# Patient Record
Sex: Female | Born: 1935
Health system: Southern US, Community
[De-identification: ages and names within clinical notes are randomized; demographics above are authoritative.]

## PROBLEM LIST (undated history)

## (undated) DIAGNOSIS — R32 Unspecified urinary incontinence: Secondary | ICD-10-CM

## (undated) DIAGNOSIS — S065X9A Traumatic subdural hemorrhage with loss of consciousness of unspecified duration, initial encounter: Secondary | ICD-10-CM

## (undated) DIAGNOSIS — R569 Unspecified convulsions: Secondary | ICD-10-CM

## (undated) DIAGNOSIS — A159 Respiratory tuberculosis unspecified: Secondary | ICD-10-CM

## (undated) DIAGNOSIS — I10 Essential (primary) hypertension: Secondary | ICD-10-CM

## (undated) DIAGNOSIS — S0300XA Dislocation of jaw, unspecified side, initial encounter: Secondary | ICD-10-CM

## (undated) DIAGNOSIS — E039 Hypothyroidism, unspecified: Secondary | ICD-10-CM

## (undated) DIAGNOSIS — E215 Disorder of parathyroid gland, unspecified: Secondary | ICD-10-CM

## (undated) DIAGNOSIS — M1990 Unspecified osteoarthritis, unspecified site: Secondary | ICD-10-CM

## (undated) DIAGNOSIS — R5381 Other malaise: Secondary | ICD-10-CM

## (undated) DIAGNOSIS — F32A Depression, unspecified: Secondary | ICD-10-CM

## (undated) DIAGNOSIS — S065XAA Traumatic subdural hemorrhage with loss of consciousness status unknown, initial encounter: Secondary | ICD-10-CM

## (undated) DIAGNOSIS — N183 Chronic kidney disease, stage 3 unspecified: Secondary | ICD-10-CM

## (undated) DIAGNOSIS — E079 Disorder of thyroid, unspecified: Secondary | ICD-10-CM

## (undated) DIAGNOSIS — I639 Cerebral infarction, unspecified: Secondary | ICD-10-CM

## (undated) DIAGNOSIS — R609 Edema, unspecified: Secondary | ICD-10-CM

## (undated) DIAGNOSIS — E7800 Pure hypercholesterolemia, unspecified: Secondary | ICD-10-CM

## (undated) DIAGNOSIS — F329 Major depressive disorder, single episode, unspecified: Secondary | ICD-10-CM

## (undated) DIAGNOSIS — R634 Abnormal weight loss: Secondary | ICD-10-CM

## (undated) DIAGNOSIS — E785 Hyperlipidemia, unspecified: Secondary | ICD-10-CM

## (undated) HISTORY — DX: Other malaise: R53.81

## (undated) HISTORY — DX: Edema, unspecified: R60.9

## (undated) HISTORY — DX: Abnormal weight loss: R63.4

## (undated) HISTORY — PX: BACK SURGERY: SHX140

## (undated) HISTORY — DX: Cerebral infarction, unspecified: I63.9

## (undated) HISTORY — DX: Disorder of parathyroid gland, unspecified: E21.5

## (undated) HISTORY — DX: Dislocation of jaw, unspecified side, initial encounter: S03.00XA

## (undated) HISTORY — DX: Disorder of thyroid, unspecified: E07.9

## (undated) HISTORY — DX: Unspecified urinary incontinence: R32

## (undated) HISTORY — DX: Unspecified osteoarthritis, unspecified site: M19.90

## (undated) HISTORY — PX: ABDOMINAL HYSTERECTOMY: SHX81

## (undated) HISTORY — PX: OTHER SURGICAL HISTORY: SHX169

## (undated) HISTORY — DX: Hyperlipidemia, unspecified: E78.5

## (undated) HISTORY — PX: BRAIN SURGERY: SHX531

## (undated) SURGERY — LEFT HEART CATH AND CORONARY ANGIOGRAPHY
Anesthesia: Moderate Sedation

---

## 2001-10-19 ENCOUNTER — Encounter: Payer: Self-pay | Admitting: Neurosurgery

## 2001-10-19 ENCOUNTER — Encounter (INDEPENDENT_AMBULATORY_CARE_PROVIDER_SITE_OTHER): Payer: Self-pay

## 2001-10-19 ENCOUNTER — Inpatient Hospital Stay (HOSPITAL_COMMUNITY): Admission: AD | Admit: 2001-10-19 | Discharge: 2001-10-30 | Payer: Self-pay | Admitting: Neurosurgery

## 2001-10-20 ENCOUNTER — Encounter: Payer: Self-pay | Admitting: Pulmonary Disease

## 2001-10-21 ENCOUNTER — Encounter: Payer: Self-pay | Admitting: Pulmonary Disease

## 2001-10-21 ENCOUNTER — Encounter: Payer: Self-pay | Admitting: Neurosurgery

## 2001-10-22 ENCOUNTER — Encounter: Payer: Self-pay | Admitting: Pulmonary Disease

## 2001-10-23 ENCOUNTER — Encounter: Payer: Self-pay | Admitting: Neurosurgery

## 2001-10-25 ENCOUNTER — Encounter: Payer: Self-pay | Admitting: Neurological Surgery

## 2001-10-30 ENCOUNTER — Inpatient Hospital Stay (HOSPITAL_COMMUNITY)
Admission: RE | Admit: 2001-10-30 | Discharge: 2001-11-28 | Payer: Self-pay | Admitting: Physical Medicine & Rehabilitation

## 2001-11-13 ENCOUNTER — Encounter: Payer: Self-pay | Admitting: Physical Medicine & Rehabilitation

## 2001-11-28 ENCOUNTER — Encounter: Payer: Self-pay | Admitting: Physical Medicine & Rehabilitation

## 2002-01-24 ENCOUNTER — Encounter: Payer: Self-pay | Admitting: Neurosurgery

## 2002-01-26 ENCOUNTER — Inpatient Hospital Stay (HOSPITAL_COMMUNITY): Admission: RE | Admit: 2002-01-26 | Discharge: 2002-01-29 | Payer: Self-pay | Admitting: Neurosurgery

## 2002-03-15 ENCOUNTER — Encounter: Admission: RE | Admit: 2002-03-15 | Discharge: 2002-03-15 | Payer: Self-pay | Admitting: Neurosurgery

## 2002-03-15 ENCOUNTER — Encounter: Payer: Self-pay | Admitting: Neurosurgery

## 2003-07-14 ENCOUNTER — Other Ambulatory Visit: Payer: Self-pay

## 2004-10-29 ENCOUNTER — Ambulatory Visit: Payer: Self-pay | Admitting: Internal Medicine

## 2005-01-07 ENCOUNTER — Ambulatory Visit: Payer: Self-pay | Admitting: Unknown Physician Specialty

## 2005-01-08 ENCOUNTER — Ambulatory Visit: Payer: Self-pay | Admitting: Unknown Physician Specialty

## 2005-10-25 ENCOUNTER — Ambulatory Visit: Payer: Self-pay | Admitting: Internal Medicine

## 2005-11-02 ENCOUNTER — Ambulatory Visit: Payer: Self-pay | Admitting: Internal Medicine

## 2005-12-23 ENCOUNTER — Encounter: Payer: Self-pay | Admitting: Podiatry

## 2005-12-28 ENCOUNTER — Encounter: Payer: Self-pay | Admitting: Podiatry

## 2006-06-09 ENCOUNTER — Ambulatory Visit: Payer: Self-pay | Admitting: Nurse Practitioner

## 2006-09-07 ENCOUNTER — Other Ambulatory Visit: Payer: Self-pay

## 2006-09-07 ENCOUNTER — Inpatient Hospital Stay: Payer: Self-pay | Admitting: Internal Medicine

## 2007-02-28 ENCOUNTER — Ambulatory Visit: Payer: Self-pay | Admitting: Internal Medicine

## 2007-03-28 ENCOUNTER — Ambulatory Visit: Payer: Self-pay | Admitting: Family Medicine

## 2007-05-20 ENCOUNTER — Inpatient Hospital Stay: Payer: Self-pay | Admitting: Internal Medicine

## 2007-05-20 ENCOUNTER — Other Ambulatory Visit: Payer: Self-pay

## 2007-05-29 ENCOUNTER — Ambulatory Visit: Payer: Self-pay | Admitting: Unknown Physician Specialty

## 2007-07-13 ENCOUNTER — Ambulatory Visit (HOSPITAL_COMMUNITY): Admission: RE | Admit: 2007-07-13 | Discharge: 2007-07-13 | Payer: Self-pay | Admitting: Surgery

## 2007-07-26 ENCOUNTER — Ambulatory Visit: Payer: Self-pay | Admitting: Internal Medicine

## 2007-09-13 ENCOUNTER — Encounter (INDEPENDENT_AMBULATORY_CARE_PROVIDER_SITE_OTHER): Payer: Self-pay | Admitting: Surgery

## 2007-09-13 ENCOUNTER — Ambulatory Visit (HOSPITAL_COMMUNITY): Admission: RE | Admit: 2007-09-13 | Discharge: 2007-09-14 | Payer: Self-pay | Admitting: Surgery

## 2007-09-28 ENCOUNTER — Ambulatory Visit: Payer: Self-pay | Admitting: Internal Medicine

## 2008-01-18 ENCOUNTER — Emergency Department: Payer: Self-pay | Admitting: Emergency Medicine

## 2008-01-18 ENCOUNTER — Other Ambulatory Visit: Payer: Self-pay

## 2008-02-13 ENCOUNTER — Ambulatory Visit: Payer: Self-pay | Admitting: Unknown Physician Specialty

## 2008-03-04 ENCOUNTER — Ambulatory Visit: Payer: Self-pay | Admitting: Internal Medicine

## 2008-04-06 ENCOUNTER — Ambulatory Visit: Payer: Self-pay | Admitting: Ophthalmology

## 2009-03-13 ENCOUNTER — Ambulatory Visit: Payer: Self-pay | Admitting: Internal Medicine

## 2010-03-06 ENCOUNTER — Ambulatory Visit: Payer: Self-pay | Admitting: Physician Assistant

## 2010-04-06 ENCOUNTER — Inpatient Hospital Stay: Payer: Self-pay | Admitting: Internal Medicine

## 2010-04-28 ENCOUNTER — Inpatient Hospital Stay: Payer: Self-pay | Admitting: Internal Medicine

## 2010-05-15 ENCOUNTER — Ambulatory Visit: Payer: Self-pay | Admitting: Internal Medicine

## 2011-01-12 NOTE — Op Note (Signed)
NAME:  Shawna [name], Shawna [name]              ACCOUNT NO.:  [id number]   MEDICAL RECORD NO.:  [id number]          PATIENT TYPE:  OIB   LOCATION:  2550                         FACILITY:  [facility]   PHYSICIAN:  Velora Heckler, MD      DATE OF BIRTH:  02-Nov-1935   DATE OF PROCEDURE:  09/13/2007  DATE OF DISCHARGE:                               OPERATIVE REPORT   PREOPERATIVE DIAGNOSIS:  Primary hyperparathyroidism.   POSTOPERATIVE DIAGNOSIS:  Primary hyperparathyroidism.   PROCEDURE:  Right inferior minimally invasive parathyroidectomy.   SURGEON:  Velora Heckler, M.D., FACS   ASSISTANT:  Magnus Ivan, R.N.F.A.   ANESTHESIA:  General per Dr. Diamantina Monks.   ESTIMATED BLOOD LOSS:  Minimal.   PREPARATION:  Betadine.   COMPLICATIONS:  None.   INDICATIONS:  The patient is a 75 year old white female from [place],  [place].  She is referred by Dr. Einar Crow and Dr. Francia Greaves, both of the [facility].  The patient has been noted with  elevated serum calcium levels ranging from 10.7-11.3.  An intact PTH  level was elevated at 157.  The patient underwent sestamibi scan  September 2008 at [facility] demonstrating a  parathyroid adenoma in the right inferior position.  The patient now  comes to surgery for neck exploration and resection.   BODY OF REPORT:  Procedure was done in OR #16 at the [facility].  The patient was brought to the operating room and  placed in the supine position on the operating room table.  Following  administration of general anesthesia, the patient was positioned and  then prepped and draped in the usual strict aseptic fashion.  After  ascertaining that an adequate level of anesthesia had been achieved, a  lower right neck incision was made with a #15 blade.  Dissection was  carried through subcutaneous tissues and platysma.  Hemostasis was  obtained with electrocautery.  Subplatysmal flaps were  developed  cephalad and caudad.  A Weitlaner retractor was placed for exposure.  Strap muscles were incised in the midline and reflected to the right.  Inferior pole of the thyroid gland was identified and mobilized.  Dissection in the tracheoesophageal groove revealed a prominent  thyrothymic tract containing tissue.  This was dissected out.  A  Ligaclip was placed at each end, and the tract was excised using the  harmonic scalpel.  It was submitted to pathology.  Dr. Colonel Bald from  pathology did a frozen section and identified what appears to be normal  parathyroid tissue within the thyrothymic tract.  Further exploration in  the tracheoesophageal groove, however, reveals an enlarged nodule with  the appearance of parathyroid adenoma.  This was gently dissected out.  Vascular tributaries were divided between small and medium hemoclips  with the harmonic scalpel.  Using a Administrator, [employer], the mass was  gently mobilized away from surrounding structures and delivered up and  into the wound.  It was fully excised using the harmonic scalpel.  Specimen was submitted to pathology.  It weighed 1600 mg and was  parathyroid tissue  consistent with parathyroid adenoma.  Good hemostasis  was obtained throughout the wound.  Surgicel was placed in the operative  field.  Strap muscles were reapproximated in the midline with  interrupted 3-0 Vicryl sutures.  Platysma was closed with interrupted 3-  0 Vicryl sutures.  Skin was closed with a running 4-0 Vicryl  subcuticular suture.  Wound was washed and dried, and Benzoin and Steri-  Strips were applied.  Sterile dressings were applied.  The patient was  awakened from anesthesia and brought to the recovery room in stable  condition.  The patient tolerated the procedure well.      Velora Heckler, MD  Electronically Signed     TMG/MEDQ  D:  09/13/2007  T:  09/13/2007  Job:  [id number]   cc:   Einar Crow, M.D.  Francia Greaves, M.D.

## 2011-01-15 NOTE — H&P (Signed)
[place]  Patient:    Shawna [name], Shawna [name] Visit Number: [id number] MRN: [id number]          Service Type: SUR Location: 3000 3031 01 Attending Physician:  Danella Penton Dictated by:   Tanya Nones. Jeral Fruit, M.D. Admit Date:  01/26/2002                           History and Physical  HISTORY OF PRESENT ILLNESS:  Shawna [name] is a lady who is being admitted today for reconstruction of a skull defect on the right side.  Back on October 19, 2001, this lady had a subdural hematoma which was evacuated through a craniotomy.  After surgery, the patient did well.  She was transferred to the rehabilitation center and eventually she was discharged to go home.  She was seen by me in my office and clinically she was doing much better.  Because of that, we decided to proceed with repair of the skull defect.  MEDICATIONS: 1. Effexor. 2. Levoxyl. 3. Lipitor. 4. Dilantin. 5. Catapres.  SOCIAL HISTORY:  She does not drink.  She does not use any tobacco.  REVIEW OF SYSTEMS:  Positive for a history of high cholesterol and high blood pressure.  PAST SURGICAL HISTORY:  She had back surgery many years ago, hysterectomy, and the subdural hematoma.  PHYSICAL EXAMINATION:  HEENT:  There is a skull defect in the right frontotemporal area from the previous surgery.  NECK:  She has hyperflexibility.  LUNGS:  Clear.  HEART:  Normal heart sounds.  ABDOMEN:  Normal.  EXTREMITIES:  Normal pulses.  BACK:  She has a scar in the lumbar area from previous surgery.  NEUROLOGIC:  Mental status normal.  She is oriented x 3.  Although slow to respond, nonetheless she is no worse worse now.  She knows that she is in [facility], the date, and the name of the president.  The cranial nerves are normal, except for some mild weakness in the right face. Strength is 5/5.  Sensation is normal.  LABORATORY DATA:  She had a previous CT scan which  showed a skull defect on the right side from the previous surgery.  CLINICAL IMPRESSION:  Right frontotemporal skull defect post subdural hematoma.  PLAN:  The patient is going to be admitted for a right frontotemporal cranioplasty.  The risks of rebleeding, need for removal of the flap, and infection. Dictated by:   Tanya Nones. Jeral Fruit, M.D. Attending Physician:  Danella Penton DD:  01/26/02 TD:  01/27/02 Job: [id number]

## 2011-01-15 NOTE — Op Note (Signed)
[place]  Patient:    ALOIS, MINCER Visit Number: [id number] MRN: [id number]          Service Type: MED Location: 3100 3104 01 Attending Physician:  Danella Penton Dictated by:   Tanya Nones. Jeral Fruit, M.D. Proc. Date: 10/19/01 Admit Date:  10/19/2001                             Operative Report  PREOPERATIVE DIAGNOSIS:  Large right subdural hematoma with placement of the brain from right to left, and subdural hematoma going into the tentorium, Coumadin therapy.  POSTOPERATIVE DIAGNOSIS:  Large right subdural hematoma with placement of the brain from right to left, and subdural hematoma going into the tentorium, Coumadin therapy.  PROCEDURE:  Right temporal frontal parietal craniotomy with evacuation of subdural hematoma.  SURGEON:  Tanya Nones. Jeral Fruit, M.D.  ASSISTANT:  Stefani Dama, M.D.  CLINICAL HISTORY:  The patient is a 75 year old lady who was on Coumadin and who was transferred from [facility] because of subdural hematoma after she fell.  I admitted this patient and by the time she came over here, was already intubated.  She was paralyzed with medication.  The pupils are 4 mm and nonreactive.  Because of the medication they used to ______ her, it was difficult to do an evaluation except that she was having some bilateral degeneration.  I talked the patient with the family, and the patient was taken immediately for surgery.  DESCRIPTION OF PROCEDURE:  The patient was taken to the OR and the whole head was prepped with Betadine.  The area was prepped with Betadine.  Immediately, an incision was made in the upper temporal area through the skin down to the galea.  A bur hole was made and immediately I opened the dural matter and I started draining the subdural hematoma.  It was thick clots.  Once we had an opening for decompression, we did a standard temporal parietal frontal craniotomy through the scalp.  A different bur  hole was made and the craniotomy flap was elevated.  The dural matter was also elevated.  Indeed, this patient has a thick clot of approximately 4-5 cm, going all the way from the frontal area to the middle fossa to the posterior fossa down into the tentorium.  Irrigation was achieved using plenty of irrigation.  We found two areas of contusion, one in the posterior aspect of the temporal lobe and the other one in the _______ of the temporal lobe.  Hemostasis was done and Gelfoam was used in this area to achieve hemostasis.  We investigated all the area, and again, we had plenty _______ of this dura and the brain was pulsatile.  We waited at least 20 minutes just to be sure, and there was not any more bleeding.  Having achieved this, we put two drains in the subdural space.  We closed the dural matter loosely using silk.  The bone flap was not pulled back in place.  A Hemovac was left in the epidural space and the scalp was closed with Vicryl and nylon.  The patient is going to be transferred back to the intensive care unit.  The prognosis is quite grave. Dictated by:   Tanya Nones. Jeral Fruit, M.D. Attending Physician:  Danella Penton DD:  10/19/01 TD:  10/20/01 Job: [id number]

## 2011-01-15 NOTE — Discharge Summary (Signed)
[place]. [facility]  Patient:    Shawna [name], Shawna [name] Visit Number: [id number] MRN: [id number]          Service Type: SUR Location: 3000 3031 01 Attending Physician:  Danella Penton Dictated by:   Tanya Nones. Jeral Fruit, M.D. Admit Date:  01/26/2002 Discharge Date: 01/29/2002                             Discharge Summary  ADMISSION DIAGNOSIS: Right frontal temporal skull defect.  FINAL DIAGNOSIS: Right frontal temporal skull defect.  HISTORY OF PRESENT ILLNESS: The patient was admitted to repair the skull defect. The patient was previously admitted to the hospital with a large subdural hematoma. At this time evacuation was accomplished, leaving the bone flap _____. Today she is coming to have the skull defect repaired.  LABORATORY DATA: Normal.  HOSPITAL COURSE: The patient was taken to surgery on 01/26/02, and a right frontal temporal cranioplasty was done. Today she is doing much better. She is awake and following commands and she is ready to go home.  CONDITION ON DISCHARGE: Improved.  DISCHARGE MEDICATIONS: Vicodin. She is to continue taking the other medication including Dilantin.  DISCHARGE INSTRUCTIONS:  Regular diet. She is to try not to do any heavy lifting.  FOLLOW UP: We will see her next week. Dictated by:   Tanya Nones. Jeral Fruit, M.D. Attending Physician:  Danella Penton DD:  01/29/02 TD:  01/30/02 Job: [phone]

## 2011-01-15 NOTE — Op Note (Signed)
[place]  Patient:    Shawna [name], Shawna [name] Visit Number: [id number] MRN: [id number]          Service Type: SUR Location: 3000 3031 01 Attending Physician:  Danella Penton Dictated by:   Tanya Nones. Jeral Fruit, M.D. Proc. Date: 01/26/02 Admit Date:  01/26/2002                             Operative Report  PREOPERATIVE DIAGNOSIS:  Right frontal temporal cranial defect, secondary to acute subdural hematoma.  POSTOPERATIVE DIAGNOSIS:  Right frontal temporal cranial defect, secondary to acute subdural hematoma.  PROCEDURE:  Right frontal temporal cranioplasty.  SURGEON:  Tanya Nones. Jeral Fruit, M.D.  ASSISTANT:  Cristi Loron, M.D.  CLINICAL HISTORY:  The patient is being admitted to have a skull defect repair.  This lady had a ________ hematoma ________ .  The surgery was fully explained and the risks were explained in the history and physical.  DESCRIPTION OF PROCEDURE:  The patient was taken to the OR and after adequate sedation, the right side of the head was prepped with Betadine.  Then incision from the previous one was made with phrenic lid upright to the edges.  Prior to that we used Xylocaine with epinephrine to infiltrate the scalp.  We elevated the scalp flap and were able to delineate the ________ fat.  Having done this, we took a ________ methacrylate, and after he was ready for hard consistency we allowed the substance to cover this cul-de-sac.  After it was done, we made several holes to allow drainage of any fluid collection into the scalp.  The plate was secured in place, using of plate and some screws. Having done this, the area was irrigated and the scalp was closed with 0 Vicryl and staples.  The patient did well. Dictated by:   Tanya Nones. Jeral Fruit, M.D. Attending Physician:  Danella Penton DD:  01/26/02 TD:  01/29/02 Job: [id number]

## 2011-01-15 NOTE — Discharge Summary (Signed)
[place]. [facility]  Patient:    PRESTON, WEILL Visit Number: [id number] MRN: [id number]          Service Type: [facility] Attending Physician:  Faith Rogue T Dictated by:   Tanya Nones. Jeral Fruit, M.D. Admit Date:  10/30/2001 Discharge Date: 11/28/2001                             Discharge Summary  ADMISSION DIAGNOSIS:  Right frontotemporal parietal craniotomy for subdural hematoma.  FINAL DIAGNOSIS:  Subdural hematoma in right frontotemporal parietal.  HISTORY OF PRESENT ILLNESS:  The patient was admitted straight to the intensive care unit from the emergency room of [facility] in [place], [place].  The patient has a history of stroke in the past. She had been on Coumadin.  The patient was found to be unconscious.  A CT scan done at [facility] showed a large frontotemporal parietal craniotomy. Her PT and PTT were elevated.  The patient was given vitamin K and fresh frozen plasma and taken to surgery as an emergency.  LABORATORY DATA:  At time of being transferred to the floor, work-up was within normal limits.  HOSPITAL COURSE:  The patient was taken the same day to the operating room where a right frontotemporal parietal craniotomy with evacuation of a large subdural was done.  Prior to surgery and during surgery, we gave her vitamin K, as well as fresh frozen plasma.  After the surgery, she was transferred to the intensive care unit where she remained on a respirator.  The patient continued to get better.  She was able to open her eyes and follow commands. After two weeks, the patient was moving all four extremities, although she was sort of ignoring the left side.  Nevertheless, the patient was able to move all four extremities.  Her anemia resolved.  The PT and PTT were within normal limits.  The patient was taken off of the respirator.  While she was in the intensive care unit, she was seen by the neurological team, including  speech therapy.  On October 30, 2001, after being evaluated by the rehabilitation center, it was decided that the patient would benefit from a further at the rehabilitation center and she was transferred to the rehabilitation center.  CONDITION ON DISCHARGE:  Improving.  DISCHARGE MEDICATIONS:  She will continue on the same medication.  FOLLOW-UP:  I will follow the patient while she is in the hospital.  Once she is discharged, I will see her my office and probably will proceed with a cranioplasty three to four months after her surgery.  ACTIVITY:  This will be up to the rehabilitation center. Dictated by:   Tanya Nones. Jeral Fruit, M.D. Attending Physician:  Faith Rogue T DD:  12/08/01 TD:  12/09/01 Job: [id number]

## 2011-01-15 NOTE — Discharge Summary (Signed)
[place]  Patient:    Shawna [name], Shawna [name] Visit Number: [id number] MRN: [id number]          Service Type: [facility] Attending Physician:  Faith Rogue T Dictated by:   Dian Situ, PA Admit Date:  10/30/2001 Discharge Date: 11/28/2001   CC:         Janeece Riggers. Dareen Piano, M.D.  Hilda Lias, M.D.   Discharge Summary  DISCHARGE DIAGNOSES: 1. Status post craniotomy for subdural hemorrhage. 2. Hypertension. 3. Hypothyroidism. 4. Depression. 5. Post-traumatic anemia. 6. Insomnia, improved. 7. Klebsiella pneumoniae urinary tract infection, treated.  HISTORY OF PRESENT ILLNESS: The patient is a 75 year old female with a history of hypothyroidism and cerebrovascular accident on chronic Coumadin who fell on February 19 and was noted to have confusion with mental status changes.  The next day she was taken to [facility] and required intubation as comatose.  CT done there showed a large subdural hemorrhage and she was transferred to  [facility] on February 20 and underwent right temporal frontoparietal craniotomy for evacuation by Dr. Jeral Fruit. Postoperatively she was placed on Dilantin for seizure prophylaxis at a level of 10.7 today.  She was noted to have decreased movement on the left.  Bedside swallow was done showing minimal dysphagia.  She was placed on thin liquids. Physical therapy was initiated and currently the patient is +2, total, 25% to transfer with tendency to leaning to the right, total assist with 20% to scoot to the right and some visual deficits noted.  PAST MEDICAL HISTORY: 1. Tuberculosis 44 years ago. 2. History of cerebrovascular accident with onset of seizures. 3. Hypothyroidism. 4. Depression diagnosed in December 2002. 5. Right shoulder pain secondary to degenerative joint disease.  ALLERGIES:  No known drug allergies.  SOCIAL HISTORY: The patient is married and lives in a one level  home.. There are one to two steps at entry.  She was independent prior to admission.  She does not use any tobacco or alcohol.  The patient was admitted to rehab on October 29, 2001 for inpatient therapy to consider PT, OT and speech therapy.  PAST ADMISSION: On admission the patient was noted to be impulsive and has had some problems with insomnia.  She was also noted to have problems with depressed mood and Effexor was added q.h.s. to help with sleep as well as appetite and mood.  Gradually during her schedule it has also helped with her insomnia and sundowning issues.  Initially she was noted to have problems with lethargy and endurance issues. A follow-up CT was done on April 1 showing a small amount of right temporal subdural fluid with resolution of the falcine component, expected progression of right parietal infarct without extension, postoperative changes stable.  She was started on Ritalin to help with initiation and arousal and this has helped greatly. By the time of discharge she was able to participate in most therapies without lethargy throughout the day and Ritalin was set up for taper past discharge.  Laboratories were checked on past admission and had been followed along. The last check was March 17: Hemoglobin 12.7,  hematocrit 37.3, white count 5.3, platelets 203,000.  Electrolytes on March 26:  Sodium 140, potassium 3.5, chloride 106, C02 30, BUN 5, creatinine 0.8, glucose 175. AST 33, ALT 30, alkaline phosphatase 92, total bilirubin 0.5.  TSH level checked was normal at 1.862. Dilantin level last done on March 17 was at 8.6.  As the patient had low albumin levels at  3.2, this level can be in the low therapeutic range.  The patient has had issues with a UTI.  She was treated for Citrobacter UTI on past admission. She did have a recurrent UTI on March 15 with greater than 100,000 colonies of Klebsiella pneumoniae and was treated with seven days of Cipro for this.  By  the time of discharge the patients speech and intelligibility had improved to approximately 70% in conversation and attention, memory and organization were also noted to be improved.  Basic comprehension/expression were intact High level comprehension was intact; however, she showed inconsistent understanding of inferential information.  She was oriented times four.  Her short term memory was intact for immediate recall and delay.  She had problems with delay recall.  She required minimal assist for complex verbal solving problems. Verbal organization was intact.  Reading and writing for basic information was intact.  She showed mild impairment in higher expression secondary to decease in attention and organization.  Mild to moderate dysarthria was occasionally noted.  The patient was at supervision for breathing, required minimal assist for dressing, minimal assist for feeding a minimal and minimal assist for advanced ADLs. 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  She continued to have mild left neglect which exacerbated her mild left-sided weakness.  In terms of mobility she was at close supervision to minimal assist for transfers secondary to safety issues, minimal assist ambulating 200 feet with hand held assist secondary to safety issues.  Her visual impairment and difficulty in walking with occasional loss of balance placed her at increased risk for fall, even with assistance.  She was noted to have variable tendency to lean towards the right with increased fatigue or distraction.  However, by the time of discharge she was able to correct this with cuing.  Left inattention to left extremities and environment with decrease in sensation and vision was also impairing her standing balance.  Twenty four hour supervision with assistance as needed is recommended past  discharge and the family is to  provide this.  The patient is to continue with follow-up PT, OT and speech therapy at [facility] outpatient center to begin on December 04, 2001.  On November 28, 2001 the patient is discharged to home.  DISCHARGE MEDICATIONS: 1. Dilantin 100 mg in a.m. and 200 mg q.h.s. 2. Catapres TTS 2 change q. Monday. 3. Levothyroxine 50 mcg a day. 4. Ritalin 10 mg at 7 a.m. and one at noon for three days, then one p.o. b.i.d. times three days and discontinue. 5. EFfexor XR 37.5 mg per day. 6. Trazodone 50 mg q.h.s. p.r.n. no sleep.  ACTIVITY:  Twenty four hour supervision assistance.  SPECIAL INSTRUCTIONS:  No alcohol.  No smoking.  No driving. Outpatient PT, OT and speech therapy at [facility] beginning December 04, 2001 at 9 a.m. to 11 a.m.  FOLLOW-UP: 1. The patient is to follow-up with Dr. Jeral Fruit in the next few weeks. 2. Follow-up with Dr. Hermelinda Medicus on May 7 at noon. Dictated by:   Dian Situ, PA Attending Physician:  Faith Rogue T DD:  01/14/02 TD:  01/16/02 Job: [id number]

## 2011-01-15 NOTE — H&P (Signed)
[place]  Patient:    MCKENIZE, MEZERA Visit Number: [id number] MRN: [id number]          Service Type: MED Location: [id number] Attending Physician:  Danella Penton Dictated by:   Tanya Nones. Jeral Fruit, M.D. Admit Date:  10/19/2001                           History and Physical  CLINICAL HISTORY:  Ms. Uncapher is a 75 year old female who was taken to [facility] in [place].  We were called from the emergency room because the lady was found by x-ray that she had a large subdural hematoma. According to the history, it seems that the patient fell yesterday.  She felt fine but this morning she was found to be confused.  By the time she was seen in the emergency room she was really comatose.  She was intubated, a CT scan was obtained, and it was found that she had a large subdural hematoma with shift from right to left.  The patient had been on Coumadin.  Because of that, we transferred the patient immediately to the intensive care unit and we were ready in the operating room for her.  We started giving vitamin K as well as fresh frozen plasma.  I talked briefly to the family about the outcome and that the patient was taken immediately as an emergency to the operating room for evacuation of subdural hematoma.  The rest of the clinical history and past medical history will be obtained later on after surgery, as this is an emergency. Dictated by:   Tanya Nones. Jeral Fruit, M.D. Attending Physician:  Danella Penton DD:  10/19/01 TD:  10/19/01 Job: [id number]

## 2011-05-19 LAB — CBC
HCT: 38.3
Hemoglobin: 13.1
MCHC: 34.2
MCV: 92.8
Platelets: 157
RBC: 4.12
RDW: 14.1
WBC: 6.2

## 2011-05-19 LAB — DIFFERENTIAL
Basophils Absolute: 0
Basophils Relative: 0
Eosinophils Absolute: 0.2
Eosinophils Relative: 3
Lymphocytes Relative: 29
Lymphs Abs: 1.8
Monocytes Absolute: 0.5
Monocytes Relative: 9
Neutro Abs: 3.6
Neutrophils Relative %: 59

## 2011-05-19 LAB — URINALYSIS, ROUTINE W REFLEX MICROSCOPIC
Bilirubin Urine: NEGATIVE
Glucose, UA: NEGATIVE
Hgb urine dipstick: NEGATIVE
Ketones, ur: NEGATIVE
Nitrite: NEGATIVE
Specific Gravity, Urine: 1.016
pH: 7

## 2011-05-19 LAB — COMPREHENSIVE METABOLIC PANEL
ALT: 16
AST: 23
Albumin: 4.4
Alkaline Phosphatase: 74
BUN: 22
CO2: 23
Calcium: 10.6 — ABNORMAL HIGH
Chloride: 106
Creatinine, Ser: 0.94
GFR calc Af Amer: >60
GFR calc non Af Amer: 59 — ABNORMAL LOW
Glucose, Bld: 102 — ABNORMAL HIGH
Potassium: 4.2
Sodium: 138
Total Bilirubin: 0.6
Total Protein: 7

## 2011-05-19 LAB — PROTIME-INR
INR: 0.9
Prothrombin Time: 12.1

## 2011-05-20 LAB — CALCIUM: Calcium: 8.5

## 2011-06-08 LAB — URINE MICROSCOPIC-ADD ON

## 2011-06-08 LAB — COMPREHENSIVE METABOLIC PANEL
AST: 30
Albumin: 4.2
Alkaline Phosphatase: 73
BUN: 17
CO2: 26
Chloride: 105
Creatinine, Ser: 0.87
GFR calc non Af Amer: >60
Potassium: 3.9
Total Bilirubin: 1

## 2011-06-08 LAB — CBC
HCT: 36.8
MCV: 92.9
Platelets: 170
RBC: 3.96
WBC: 10.1

## 2011-06-08 LAB — DIFFERENTIAL
Basophils Absolute: 0
Basophils Relative: 0
Eosinophils Relative: 3
Monocytes Absolute: 0.8
Neutro Abs: 7.2

## 2011-06-08 LAB — URINALYSIS, ROUTINE W REFLEX MICROSCOPIC
Glucose, UA: NEGATIVE
Hgb urine dipstick: NEGATIVE
Ketones, ur: NEGATIVE
Protein, ur: NEGATIVE
Urobilinogen, UA: 0.2

## 2011-06-08 LAB — PROTIME-INR: Prothrombin Time: 12.7

## 2011-07-21 ENCOUNTER — Ambulatory Visit: Payer: Self-pay | Admitting: Internal Medicine

## 2012-07-24 ENCOUNTER — Ambulatory Visit: Payer: Self-pay | Admitting: Internal Medicine

## 2012-10-09 ENCOUNTER — Ambulatory Visit: Payer: Self-pay | Admitting: Ophthalmology

## 2012-10-16 ENCOUNTER — Ambulatory Visit: Payer: Self-pay | Admitting: Ophthalmology

## 2012-11-14 ENCOUNTER — Ambulatory Visit: Payer: Self-pay | Admitting: Ophthalmology

## 2013-08-14 ENCOUNTER — Ambulatory Visit: Payer: Self-pay | Admitting: Internal Medicine

## 2013-12-08 DIAGNOSIS — I10 Essential (primary) hypertension: Secondary | ICD-10-CM | POA: Diagnosis present

## 2013-12-08 DIAGNOSIS — F325 Major depressive disorder, single episode, in full remission: Secondary | ICD-10-CM | POA: Insufficient documentation

## 2013-12-08 DIAGNOSIS — N183 Chronic kidney disease, stage 3 unspecified: Secondary | ICD-10-CM | POA: Insufficient documentation

## 2013-12-08 DIAGNOSIS — N1831 Chronic kidney disease, stage 3a: Secondary | ICD-10-CM | POA: Insufficient documentation

## 2013-12-08 DIAGNOSIS — R609 Edema, unspecified: Secondary | ICD-10-CM | POA: Insufficient documentation

## 2013-12-08 DIAGNOSIS — E785 Hyperlipidemia, unspecified: Secondary | ICD-10-CM | POA: Insufficient documentation

## 2013-12-08 DIAGNOSIS — Z8673 Personal history of transient ischemic attack (TIA), and cerebral infarction without residual deficits: Secondary | ICD-10-CM | POA: Insufficient documentation

## 2013-12-08 DIAGNOSIS — E039 Hypothyroidism, unspecified: Secondary | ICD-10-CM | POA: Insufficient documentation

## 2014-02-21 ENCOUNTER — Encounter: Payer: Self-pay | Admitting: Internal Medicine

## 2014-06-30 DIAGNOSIS — Z6835 Body mass index (BMI) 35.0-35.9, adult: Secondary | ICD-10-CM | POA: Insufficient documentation

## 2014-09-02 ENCOUNTER — Ambulatory Visit: Payer: Self-pay | Admitting: Internal Medicine

## 2014-12-20 NOTE — Op Note (Signed)
PATIENT NAME:  [name], Shawna LundJANET A MR#:  [id number] DATE OF BIRTH:  12/10/1935  DATE OF PROCEDURE:  10/16/2012  PREOPERATIVE DIAGNOSIS: Visually significant cataract of the left eye.   POSTOPERATIVE DIAGNOSIS: Visually significant cataract of the left eye.   OPERATIVE PROCEDURE: Cataract extraction by phacoemulsification with implant of intraocular lens to left eye.   SURGEON: Galen ManilaWilliam Keaghan Staton, MD   ANESTHESIA:  1. Managed anesthesia care.  2. Topical tetracaine drops followed by 2% Xylocaine jelly applied in the preoperative holding area.   COMPLICATIONS: None.   TECHNIQUE:  Stop and chop.  DESCRIPTION OF PROCEDURE: The patient was examined and consented in the preoperative holding area where the aforementioned topical anesthesia was applied to the left eye and then brought back to the Operating Room where the left eye was prepped and draped in the usual sterile ophthalmic fashion and a lid speculum was placed. A paracentesis was created with the side port blade and the anterior chamber was filled with viscoelastic. A near clear corneal incision was performed with the steel keratome. A continuous curvilinear capsulorrhexis was performed with a cystotome followed by the capsulorrhexis forceps. Hydrodissection and hydrodelineation were carried out with BSS on a blunt cannula. The lens was removed in a stop and chop technique and the remaining cortical material was removed with the irrigation-aspiration handpiece. The capsular bag was inflated with viscoelastic and the Technis ZCBOO 18.5-diopter lens, serial number [id number], was placed in the capsular bag without complication. The remaining viscoelastic was removed from the eye with the irrigation-aspiration handpiece. The wounds were hydrated. The anterior chamber was flushed with Miostat and the eye was inflated to physiologic pressure. 0.1 mL of cefuroxime concentration 10 mg/mL was placed in the anterior chamber. The wounds were found to be water  tight. The eye was dressed with Vigamox. The patient was given protective glasses to wear throughout the day and a shield with which to sleep tonight. The patient was also given drops with which to begin a drop regimen today and will follow-up with me in one day.    ____________________________ Jerilee FieldWilliam L. Davin Archuletta, MD wlp:cb D: 10/16/2012 18:10:41 ET T: 10/17/2012 09:47:04 ET JOB#: [id number]  cc: Josslin Sanjuan L. Zakara Parkey, MD, <Dictator> Jerilee FieldWILLIAM L Lindsi Bayliss MD ELECTRONICALLY SIGNED 10/23/2012 11:54

## 2014-12-20 NOTE — Op Note (Signed)
PATIENT NAME:  [name], Shawna LundJANET A MR#:  [id number] DATE OF BIRTH:  03-21-1936  DATE OF PROCEDURE:  11/14/2012  PREOPERATIVE DIAGNOSIS: Visually significant cataract of the right eye.   POSTOPERATIVE DIAGNOSIS: Visually significant cataract of the right eye.   OPERATIVE PROCEDURE: Cataract extraction by phacoemulsification with implant of intraocular lens to right eye.   SURGEON: Galen ManilaWilliam Tanasia Budzinski, MD   ANESTHESIA:  1. Managed anesthesia care.  2. Topical tetracaine drops followed by 2% Xylocaine jelly applied in the preoperative holding area.   COMPLICATIONS: None.   TECHNIQUE:  Stop and chop.   DESCRIPTION OF PROCEDURE: The patient was examined and consented in the preoperative holding area where the aforementioned topical anesthesia was applied to the right eye and then brought back to the Operating Room where the right eye was prepped and draped in the usual sterile ophthalmic fashion and a lid speculum was placed. A paracentesis was created with the side port blade and the anterior chamber was filled with viscoelastic. A near clear corneal incision was performed with the steel keratome. A continuous curvilinear capsulorrhexis was performed with a cystotome followed by the capsulorrhexis forceps. Hydrodissection and hydrodelineation were carried out with BSS on a blunt cannula. The lens was removed in a stop and chop technique and the remaining cortical material was removed with the irrigation-aspiration handpiece. The capsular bag was inflated with viscoelastic and the Technis ZCBOO   19.0-diopter lens, serial number [id number], was placed in the capsular bag without complication. The remaining viscoelastic was removed from the eye with the irrigation-aspiration handpiece. The wounds were hydrated. The anterior chamber was flushed with Miostat and the eye was inflated to physiologic pressure. 0.1 mL of cefuroxime concentration 10 mg/mL was placed in the anterior chamber. The wounds were found to  be water tight. The eye was dressed with Vigamox. The patient was given protective glasses to wear throughout the day and a shield with which to sleep tonight. The patient was also given drops with which to begin a drop regimen today and will follow-up with me in one day.    ____________________________ Jerilee FieldWilliam L. Zackari Ruane, MD wlp:cb D: 11/14/2012 19:34:05 ET T: 11/14/2012 21:55:52 ET JOB#: [id number]  cc: Zanyah Lentsch L. Kais Monje, MD, <Dictator> Jerilee FieldWILLIAM L Mirna Sutcliffe MD ELECTRONICALLY SIGNED 11/23/2012 8:56

## 2015-06-03 DIAGNOSIS — S7001XA Contusion of right hip, initial encounter: Secondary | ICD-10-CM | POA: Insufficient documentation

## 2015-06-03 DIAGNOSIS — I10 Essential (primary) hypertension: Secondary | ICD-10-CM | POA: Diagnosis not present

## 2015-06-03 DIAGNOSIS — Y9389 Activity, other specified: Secondary | ICD-10-CM | POA: Diagnosis not present

## 2015-06-03 DIAGNOSIS — S7011XA Contusion of right thigh, initial encounter: Secondary | ICD-10-CM | POA: Insufficient documentation

## 2015-06-03 DIAGNOSIS — Z79899 Other long term (current) drug therapy: Secondary | ICD-10-CM | POA: Diagnosis not present

## 2015-06-03 DIAGNOSIS — M25551 Pain in right hip: Secondary | ICD-10-CM | POA: Diagnosis not present

## 2015-06-03 DIAGNOSIS — S300XXA Contusion of lower back and pelvis, initial encounter: Secondary | ICD-10-CM | POA: Diagnosis not present

## 2015-06-03 DIAGNOSIS — Y998 Other external cause status: Secondary | ICD-10-CM | POA: Diagnosis not present

## 2015-06-03 DIAGNOSIS — Y9289 Other specified places as the place of occurrence of the external cause: Secondary | ICD-10-CM | POA: Diagnosis not present

## 2015-06-03 DIAGNOSIS — S79911A Unspecified injury of right hip, initial encounter: Secondary | ICD-10-CM | POA: Diagnosis present

## 2015-06-03 DIAGNOSIS — W1839XA Other fall on same level, initial encounter: Secondary | ICD-10-CM | POA: Diagnosis not present

## 2015-06-04 ENCOUNTER — Emergency Department: Payer: Medicare Other

## 2015-06-04 ENCOUNTER — Emergency Department
Admission: EM | Admit: 2015-06-04 | Discharge: 2015-06-04 | Disposition: A | Discharge status: Home / Self Care | Payer: Medicare Other | Attending: Emergency Medicine | Admitting: Emergency Medicine

## 2015-06-04 ENCOUNTER — Encounter: Payer: Self-pay | Admitting: Emergency Medicine

## 2015-06-04 DIAGNOSIS — R52 Pain, unspecified: Secondary | ICD-10-CM

## 2015-06-04 DIAGNOSIS — S7011XA Contusion of right thigh, initial encounter: Secondary | ICD-10-CM

## 2015-06-04 DIAGNOSIS — S7001XA Contusion of right hip, initial encounter: Secondary | ICD-10-CM

## 2015-06-04 HISTORY — DX: Pure hypercholesterolemia, unspecified: E78.00

## 2015-06-04 HISTORY — DX: Essential (primary) hypertension: I10

## 2015-06-04 HISTORY — DX: Cerebral infarction, unspecified: I63.9

## 2015-06-04 MED ORDER — HYDROCODONE-ACETAMINOPHEN 5-325 MG PO TABS: 1.0000 | ORAL_TABLET | Freq: Four times a day (QID) | ORAL | Status: DC | PRN

## 2015-06-04 MED ORDER — HYDROCODONE-ACETAMINOPHEN 5-325 MG PO TABS
1.0000 | ORAL_TABLET | Freq: Once | ORAL | Status: AC
  Administered 2015-06-04: 1 via ORAL
  Filled 2015-06-04: qty 1

## 2015-06-04 NOTE — ED Notes (Signed)
Pt able to ambulate with walker with minimal assistance

## 2015-06-04 NOTE — ED Notes (Signed)
Pt assisted to BR; stand-by assist required only; pt able to stand and pivot without difficulty; purplish bruising noted to medial side of right buttock

## 2015-06-04 NOTE — ED Provider Notes (Signed)
[facility] Emergency Department Montrelle Eddings Note  ____________________________________________  Time seen: Approximately 4:35 AM  I have reviewed the triage vital signs and the nursing notes.   HISTORY  Chief Complaint Hip Pain    HPI Shawna [name] is a 79 y.o. female who presents to the ED via EMS from home with a chief complaint of right hip pain. Patient reports she had a mechanical fall approximately 7 days ago when she got up from her table, pushed a stool out of her way and fell onto her right hip. Denies striking head or LOC. Complains of right hip pain since, progressively worse. Presents tonight secondary to unable to get out of her recliner from a sitting position secondary to pain. Patient denies headache, neck pain, fever, chills, chest pain, shortness of breath, abdominal pain, nausea, vomiting, diarrhea. She has attempted Tylenol without relief. Movement makes the pain worse.   Past Medical History  Diagnosis Date  . Hypertension   . Stroke (HCC)   . High cholesterol     There are no active problems to display for this patient.   History reviewed. No pertinent past surgical history.  Current Outpatient Rx  Name  Route  Sig  Dispense  Refill  . cloNIDine (CATAPRES - DOSED IN MG/24 HR) 0.2 mg/24hr patch   Transdermal   Place 1 patch onto the skin every 7 (seven) days.      1   . dipyridamole-aspirin (AGGRENOX) 200-25 MG 12hr capsule   Oral   Take 1 capsule by mouth 2 (two) times daily.         Marland Kitchen latanoprost (XALATAN) 0.005 % ophthalmic solution   Both Eyes   Place 1 drop into both eyes every evening.      3   . lisinopril-hydrochlorothiazide (PRINZIDE,ZESTORETIC) 20-25 MG tablet   Oral   Take 1 tablet by mouth daily.      1   . mirtazapine (REMERON) 45 MG tablet   Oral   Take 45 mg by mouth at bedtime.      4   . oxybutynin (DITROPAN-XL) 10 MG 24 hr tablet   Oral   Take 10 mg by mouth daily.      1   .  simvastatin (ZOCOR) 80 MG tablet   Oral   Take 80 mg by mouth daily.      1   . VIIBRYD 40 MG TABS   Oral   Take 1 tablet by mouth daily.      4     Dispense as written.   Marland Kitchen HYDROcodone-acetaminophen (NORCO) 5-325 MG tablet   Oral   Take 1 tablet by mouth every 6 (six) hours as needed for moderate pain.   15 tablet   0   . levothyroxine (SYNTHROID, LEVOTHROID) 88 MCG tablet   Oral   Take 1 tablet by mouth daily.      1     Allergies Review of patient's allergies indicates no known allergies.  No family history on file.  Social History Social History  Substance Use Topics  . Smoking status: Never Smoker   . Smokeless tobacco: None  . Alcohol Use: No    Review of Systems Constitutional: No fever/chills Eyes: No visual changes. ENT: No sore throat. Cardiovascular: Denies chest pain. Respiratory: Denies shortness of breath. Gastrointestinal: No abdominal pain.  No nausea, no vomiting.  No diarrhea.  No constipation. Genitourinary: Negative for dysuria. Musculoskeletal: Positive for right hip pain. Negative for back pain. Skin:  Negative for rash. Neurological: Negative for headaches, focal weakness or numbness.  10-point ROS otherwise negative.  ____________________________________________   PHYSICAL EXAM:  VITAL SIGNS: ED Triage Vitals  Enc Vitals Group     BP 06/04/15 0005 164/95 mmHg     Pulse Rate 06/04/15 0005 70     Resp --      Temp 06/04/15 0005 98.2 F (36.8 C)     Temp Source 06/04/15 0005 Oral     SpO2 06/04/15 0005 95 %     Weight 06/04/15 0005 230 lb (104.327 kg)     Height 06/04/15 0005 5\' 5"  (1.651 m)     Head Cir --      Peak Flow --      Pain Score 06/04/15 0011 6     Pain Loc --      Pain Edu? --      Excl. in GC? --     Constitutional: Alert and oriented. Well appearing and in no acute distress. Eyes: Conjunctivae are normal. PERRL. EOMI. Head: Atraumatic. Nose: No congestion/rhinnorhea. Mouth/Throat: Mucous membranes  are moist.  Oropharynx non-erythematous. Neck: No stridor. No cervical spine tenderness to palpation. Cardiovascular: Normal rate, regular rhythm. Grossly normal heart sounds.  Good peripheral circulation. Respiratory: Normal respiratory effort.  No retractions. Lungs CTAB. Gastrointestinal: Soft and nontender. No distention. No abdominal bruits. No CVA tenderness. Musculoskeletal:  RLE: Ecchymosis noted to lumbar spine along well healed scar. Ecchymosis to right hip and thigh. Tender to palpation posterior right hip. Limited range of motion secondary to pain. There is no shortening or rotation of the right leg. 2+ femoral and distal pulses palpated. Calf is supple without evidence for compartment syndrome. Limb is symmetrically warm without evidence for ischemia. Neurologic:  Normal speech and language. No gross focal neurologic deficits are appreciated. Gait not tested secondary to pain. Skin:  Skin is warm, dry and intact. No rash noted. Psychiatric: Mood and affect are normal. Speech and behavior are normal.  ____________________________________________   LABS (all labs ordered are listed, but only abnormal results are displayed)  Labs Reviewed - No data to display ____________________________________________  EKG  None ____________________________________________  RADIOLOGY  Right hip x-rays (viewed by me, interpreted per Dr. Jarrett Soho): No evidence of fracture or dislocation.  CT pelvis without contrast interpreted per Dr. Cherly Hensen: 1. No evidence of acute fracture or dislocation. 2. Minimal degenerative change at both hips, with posterior and superior subcortical cystic change at the right hip. 3. Mild scattered vascular calcifications seen. ____________________________________________   PROCEDURES  Procedure(s) performed: None  Critical Care performed: No  ____________________________________________   INITIAL IMPRESSION / ASSESSMENT AND PLAN / ED COURSE  Pertinent  labs & imaging results that were available during my care of the patient were reviewed by me and considered in my medical decision making (see chart for details).  79 year old female who presents with persistent right hip pain s/p mechanical fall approximately 7 days ago. Initial x-rays negative for fracture or dislocation. Given patient's persistent pain and ecchymosis on exam, will proceed to CT imaging of pelvis to include right hip. Will administer analgesia and reassess.  ----------------------------------------- 6:07 AM on 06/04/2015 -----------------------------------------  Pain improved. Updated patient and daughter of CT results. We will attempt ambulation with walker.  ----------------------------------------- 6:19 AM on 06/04/2015 -----------------------------------------  Patient ambulated well with walker. She has a walker at home. Will discharge home on Norco prescription and she will follow-up with orthopedics. Strict return precautions given. Patient and daughter verbalize understanding and agree with  plan of care. ____________________________________________   FINAL CLINICAL IMPRESSION(S) / ED DIAGNOSES  Final diagnoses:  Contusion, hip and thigh, right, initial encounter      Irean Hong, MD 06/04/15 [phone]

## 2015-06-04 NOTE — ED Notes (Signed)
Pt to ct 

## 2015-06-04 NOTE — ED Notes (Signed)
Pt to triage via w/c with no distress noted, brought in by EMS; reports fell last Thursday getting up from table, pushed stool out of way and fell; c/o right hip pain since; denies any other accomp c/o or injuries

## 2015-06-04 NOTE — ED Notes (Signed)
Pt in with co right lower hip pain, states fell on Friday and Saturday after stool rolled away from her.  Pt has been ambulatory since but states now has increased pain on ambulation.  Pt denies any head injury, no neck or back pain.

## 2015-06-04 NOTE — ED Notes (Signed)
Patient discharged to home per MD order. Patient in stable condition, and deemed medically cleared by ED provider for discharge. Discharge instructions reviewed with patient/family using "Teach Back"; verbalized understanding of medication education and administration, and information about follow-up care. Denies further concerns. ° °

## 2015-06-04 NOTE — Discharge Instructions (Signed)
1. You may take Norco #15 as needed for pain. 2. Use your walker to ambulate for the next several days for balance. 3. Return to the ER for worsening symptoms, persistent vomiting, difficulty breathing or other concerns.  Contusion A contusion is a deep bruise. Contusions are the result of a blunt injury to tissues and muscle fibers under the skin. The injury causes bleeding under the skin. The skin overlying the contusion may turn blue, purple, or yellow. Minor injuries will give you a painless contusion, but more severe contusions may stay painful and swollen for a few weeks.  CAUSES  This condition is usually caused by a blow, trauma, or direct force to an area of the body. SYMPTOMS  Symptoms of this condition include:  Swelling of the injured area.  Pain and tenderness in the injured area.  Discoloration. The area may have redness and then turn blue, purple, or yellow. DIAGNOSIS  This condition is diagnosed based on a physical exam and medical history. An X-ray, CT scan, or MRI may be needed to determine if there are any associated injuries, such as broken bones (fractures). TREATMENT  Specific treatment for this condition depends on what area of the body was injured. In general, the best treatment for a contusion is resting, icing, applying pressure to (compression), and elevating the injured area. This is often called the RICE strategy. Over-the-counter anti-inflammatory medicines may also be recommended for pain control.  HOME CARE INSTRUCTIONS   Rest the injured area.  If directed, apply ice to the injured area:  Put ice in a plastic bag.  Place a towel between your skin and the bag.  Leave the ice on for 20 minutes, 2-3 times per day.  If directed, apply light compression to the injured area using an elastic bandage. Make sure the bandage is not wrapped too tightly. Remove and reapply the bandage as directed by your health care provider.  If possible, raise (elevate) the  injured area above the level of your heart while you are sitting or lying down.  Take over-the-counter and prescription medicines only as told by your health care provider. SEEK MEDICAL CARE IF:  Your symptoms do not improve after several days of treatment.  Your symptoms get worse.  You have difficulty moving the injured area. SEEK IMMEDIATE MEDICAL CARE IF:   You have severe pain.  You have numbness in a hand or foot.  Your hand or foot turns pale or cold.   This information is not intended to replace advice given to you by your health care provider. Make sure you discuss any questions you have with your health care provider.   Document Released: 05/26/2005 Document Revised: 05/07/2015 Document Reviewed: 01/01/2015 Elsevier Interactive Patient Education [employer]

## 2015-06-11 ENCOUNTER — Emergency Department: Payer: Medicare Other

## 2015-06-11 ENCOUNTER — Emergency Department
Admission: EM | Admit: 2015-06-11 | Discharge: 2015-06-11 | Disposition: A | Discharge status: Home / Self Care | Payer: Medicare Other | Attending: Emergency Medicine | Admitting: Emergency Medicine

## 2015-06-11 DIAGNOSIS — S0083XA Contusion of other part of head, initial encounter: Secondary | ICD-10-CM

## 2015-06-11 DIAGNOSIS — Y9389 Activity, other specified: Secondary | ICD-10-CM | POA: Diagnosis not present

## 2015-06-11 DIAGNOSIS — E785 Hyperlipidemia, unspecified: Secondary | ICD-10-CM | POA: Insufficient documentation

## 2015-06-11 DIAGNOSIS — S0512XA Contusion of eyeball and orbital tissues, left eye, initial encounter: Secondary | ICD-10-CM | POA: Insufficient documentation

## 2015-06-11 DIAGNOSIS — S0592XA Unspecified injury of left eye and orbit, initial encounter: Secondary | ICD-10-CM | POA: Diagnosis present

## 2015-06-11 DIAGNOSIS — I10 Essential (primary) hypertension: Secondary | ICD-10-CM | POA: Diagnosis not present

## 2015-06-11 DIAGNOSIS — Z79899 Other long term (current) drug therapy: Secondary | ICD-10-CM | POA: Diagnosis not present

## 2015-06-11 DIAGNOSIS — Y92002 Bathroom of unspecified non-institutional (private) residence single-family (private) house as the place of occurrence of the external cause: Secondary | ICD-10-CM | POA: Insufficient documentation

## 2015-06-11 DIAGNOSIS — Y998 Other external cause status: Secondary | ICD-10-CM | POA: Insufficient documentation

## 2015-06-11 DIAGNOSIS — W01198A Fall on same level from slipping, tripping and stumbling with subsequent striking against other object, initial encounter: Secondary | ICD-10-CM | POA: Diagnosis not present

## 2015-06-11 NOTE — ED Notes (Signed)
Patient had fall at home coming from bathroom.  Patient reports that she hit head on coffee table.  Patient with bruise above left eye.  Patient denies LOC.  Per daughter patient was not using walker and the rugs that she took up yesterday were back on floor.

## 2015-06-11 NOTE — ED Provider Notes (Signed)
[facility] Emergency Department Shawna [name] Note  ____________________________________________  Time seen: Approximately 6:01 PM  I have reviewed the triage vital signs and the nursing notes.   HISTORY  Chief Complaint Fall    HPI Shawna [name] is a 79 y.o. female patient complaining of pain, bruising and edema to the left periorbital area secondary to a fall approximately 3 hours ago. Patient denies loss of consciousness. Patient states she was returning from the bathroom and tripped over a rug. Patient normally use a walker but did not use it for the short trip to the bathroom. Patient denies any vision disturbance or vertigo. No palliative measures taken for this complaint.Patient takes Aggrenox. Patient denies any other injury from the fall. Patient rates her pain as a 4/10.   Past Medical History  Diagnosis Date  . Hypertension   . Stroke (HCC)   . High cholesterol     There are no active problems to display for this patient.   History reviewed. No pertinent past surgical history.  Current Outpatient Rx  Name  Route  Sig  Dispense  Refill  . cloNIDine (CATAPRES - DOSED IN MG/24 HR) 0.2 mg/24hr patch   Transdermal   Place 1 patch onto the skin every 7 (seven) days.      1   . dipyridamole-aspirin (AGGRENOX) 200-25 MG 12hr capsule   Oral   Take 1 capsule by mouth 2 (two) times daily.         Marland Kitchen. HYDROcodone-acetaminophen (NORCO) 5-325 MG tablet   Oral   Take 1 tablet by mouth every 6 (six) hours as needed for moderate pain.   15 tablet   0   . latanoprost (XALATAN) 0.005 % ophthalmic solution   Both Eyes   Place 1 drop into both eyes every evening.      3   . levothyroxine (SYNTHROID, LEVOTHROID) 88 MCG tablet   Oral   Take 1 tablet by mouth daily.      1   . lisinopril-hydrochlorothiazide (PRINZIDE,ZESTORETIC) 20-25 MG tablet   Oral   Take 1 tablet by mouth daily.      1   . mirtazapine (REMERON) 45 MG tablet   Oral    Take 45 mg by mouth at bedtime.      4   . oxybutynin (DITROPAN-XL) 10 MG 24 hr tablet   Oral   Take 10 mg by mouth daily.      1   . simvastatin (ZOCOR) 80 MG tablet   Oral   Take 80 mg by mouth daily.      1   . VIIBRYD 40 MG TABS   Oral   Take 1 tablet by mouth daily.      4     Dispense as written.     Allergies Review of patient's allergies indicates no known allergies.  History reviewed. No pertinent family history.  Social History Social History  Substance Use Topics  . Smoking status: Never Smoker   . Smokeless tobacco: None  . Alcohol Use: No    Review of Systems Constitutional: No fever/chills Eyes: No visual changes. ENT: No sore throat. Cardiovascular: Denies chest pain. Respiratory: Denies shortness of breath. Gastrointestinal: No abdominal pain.  No nausea, no vomiting.  No diarrhea.  No constipation. Genitourinary: Negative for dysuria. Musculoskeletal: Negative for back pain. Skin: Negative for rash. Edema and ecchymosis left periorbital area Neurological: Negative for headaches, focal weakness or numbness. Endocrine:Hypertension and hyperlipidemia 10-point ROS otherwise negative.  ____________________________________________  PHYSICAL EXAM:  VITAL SIGNS: ED Triage Vitals  Enc Vitals Group     BP 06/11/15 1710 164/80 mmHg     Pulse Rate 06/11/15 1710 67     Resp 06/11/15 1710 16     Temp 06/11/15 1710 98 F (36.7 C)     Temp src --      SpO2 06/11/15 1710 97 %     Weight 06/11/15 1710 230 lb (104.327 kg)     Height 06/11/15 1710  (1.651 m)     Head Cir --      Peak Flow --      Pain Score --      Pain Loc --      Pain Edu? --      Excl. in GC? --     Constitutional: Alert and oriented. Well appearing and in no acute distress. Eyes: Conjunctivae are normal. PERRL. EOMI. left periorbital edema and ecchymosis. Head: Atraumatic. Nose: No congestion/rhinnorhea. Mouth/Throat: Mucous membranes are moist.  Oropharynx  non-erythematous. Neck: No stridor.  No cervical spine tenderness to palpation. Hematological/Lymphatic/Immunilogical: No cervical lymphadenopathy. Cardiovascular: Normal rate, regular rhythm. Grossly normal heart sounds.  Good peripheral circulation. Elevated blood pressure Respiratory: Normal respiratory effort.  No retractions. Lungs CTAB. Gastrointestinal: Soft and nontender. No distention. No abdominal bruits. No CVA tenderness. Musculoskeletal: No lower extremity tenderness nor edema.  No joint effusions. Neurologic:  Normal speech and language. No gross focal neurologic deficits are appreciated. No gait instability. Skin:  Skin is warm, dry and intact. No rash noted. Edema and ecchymosis left periorbital area. No palpable step-off of orbital bone. Psychiatric: Mood and affect are normal. Speech and behavior are normal.  ____________________________________________   LABS (all labs ordered are listed, but only abnormal results are displayed)  Labs Reviewed - No data to display ____________________________________________  EKG   ____________________________________________  RADIOLOGY  CT scans reveals no fracture of the maxillofacial area. ____________________________________________   PROCEDURES  Procedure(s) performed: None  Critical Care performed: No  ____________________________________________   INITIAL IMPRESSION / ASSESSMENT AND PLAN / ED COURSE  Pertinent labs & imaging results that were available during my care of the patient were reviewed by me and considered in my medical decision making (see chart for details).  Facial contusion. Discussed CT findings with patient and family. Advised supportive care and continue previous medications. Follow up with family doctor in 3-5 days. ____________________________________________   FINAL CLINICAL IMPRESSION(S) / ED DIAGNOSES  Final diagnoses:  Facial contusion, initial encounter      Joni Reining,  PA-C 06/11/15 1848  Myrna Blazer, MD 06/11/15 [phone]

## 2015-06-11 NOTE — Discharge Instructions (Signed)

## 2015-07-23 ENCOUNTER — Other Ambulatory Visit: Payer: Self-pay | Admitting: Internal Medicine

## 2015-07-23 DIAGNOSIS — Z1231 Encounter for screening mammogram for malignant neoplasm of breast: Secondary | ICD-10-CM

## 2015-07-23 DIAGNOSIS — Z0000 Encounter for general adult medical examination without abnormal findings: Secondary | ICD-10-CM | POA: Insufficient documentation

## 2015-09-04 ENCOUNTER — Ambulatory Visit
Admission: RE | Admit: 2015-09-04 | Discharge: 2015-09-04 | Disposition: A | Discharge status: Home / Self Care | Payer: Medicare Other | Source: Ambulatory Visit | Attending: Internal Medicine | Admitting: Internal Medicine

## 2015-09-04 ENCOUNTER — Other Ambulatory Visit: Payer: Self-pay | Admitting: Internal Medicine

## 2015-09-04 DIAGNOSIS — Z1231 Encounter for screening mammogram for malignant neoplasm of breast: Secondary | ICD-10-CM

## 2016-09-09 ENCOUNTER — Other Ambulatory Visit: Payer: Self-pay | Admitting: Internal Medicine

## 2016-09-09 DIAGNOSIS — Z1231 Encounter for screening mammogram for malignant neoplasm of breast: Secondary | ICD-10-CM

## 2016-10-07 ENCOUNTER — Ambulatory Visit
Admission: RE | Admit: 2016-10-07 | Discharge: 2016-10-07 | Disposition: A | Discharge status: Home / Self Care | Payer: Medicare Other | Source: Ambulatory Visit | Attending: Internal Medicine | Admitting: Internal Medicine

## 2016-10-07 DIAGNOSIS — Z1231 Encounter for screening mammogram for malignant neoplasm of breast: Secondary | ICD-10-CM | POA: Diagnosis not present

## 2017-02-10 DIAGNOSIS — R7309 Other abnormal glucose: Secondary | ICD-10-CM | POA: Diagnosis present

## 2017-06-08 ENCOUNTER — Ambulatory Visit
Admission: EM | Admit: 2017-06-08 | Discharge: 2017-06-08 | Disposition: A | Discharge status: Home / Self Care | Payer: Medicare Other

## 2017-06-08 ENCOUNTER — Encounter: Payer: Self-pay | Admitting: Emergency Medicine

## 2017-06-08 DIAGNOSIS — R42 Dizziness and giddiness: Secondary | ICD-10-CM

## 2017-06-08 DIAGNOSIS — R51 Headache: Secondary | ICD-10-CM

## 2017-06-08 DIAGNOSIS — H9203 Otalgia, bilateral: Secondary | ICD-10-CM | POA: Diagnosis not present

## 2017-06-08 DIAGNOSIS — M79604 Pain in right leg: Secondary | ICD-10-CM | POA: Diagnosis not present

## 2017-06-08 NOTE — Discharge Instructions (Signed)
Go to ER now for evaluation of HA, dizziness, bilateral lower leg swelling/pain right lower leg pain

## 2017-06-08 NOTE — ED Triage Notes (Signed)
Patient c/o pain in both ears with some dizziness that started 2 days ago.  Patient reports swelling and pain I her right lower leg that started yesterday.

## 2017-06-08 NOTE — ED Provider Notes (Signed)
[facility]    CSN: [id number] Arrival date & time: 06/08/17  0932     History   Chief Complaint Chief Complaint  Patient presents with  . Otalgia    both ears    HPI Shawna [name] is a 81 y.o. female.   81 yr old female presents to UC with cc of bilateral ear pain, dizziness and HA x 2 days, also c/o bilateral leg swelling, Right greater than left. Pt denies CP,SOB, or palpitations.    The history is provided by the patient. No language interpreter was used.    Past Medical History:  Diagnosis Date  . High cholesterol   . Hypertension   . Stroke [facility])     Patient Active Problem List   Diagnosis Date Noted  . Otalgia of both ears 06/08/2017  . Right leg pain 06/08/2017  . Dizzy 06/08/2017    Past Surgical History:  Procedure Laterality Date  . ABDOMINAL HYSTERECTOMY    . BRAIN SURGERY      OB History    No data available       Home Medications    Prior to Admission medications   Medication Sig Start Date End Date Taking? Authorizing Provider  cloNIDine (CATAPRES - DOSED IN MG/24 HR) 0.2 mg/24hr patch Place 1 patch onto the skin every 7 (seven) days. 05/12/15   [provider]  dipyridamole-aspirin (AGGRENOX) 200-25 MG 12hr capsule Take 1 capsule by mouth 2 (two) times daily. 05/29/15   [provider]  latanoprost (XALATAN) 0.005 % ophthalmic solution Place 1 drop into both eyes every evening. 05/29/15   [provider]  levothyroxine (SYNTHROID, LEVOTHROID) 88 MCG tablet Take 1 tablet by mouth daily. 04/24/15   [provider]  lisinopril-hydrochlorothiazide (PRINZIDE,ZESTORETIC) 20-25 MG tablet Take 1 tablet by mouth daily. 04/12/15   [provider]  mirtazapine (REMERON) 45 MG tablet Take 45 mg by mouth at bedtime. 05/29/15   [provider]  oxybutynin (DITROPAN-XL) 10 MG 24 hr tablet Take 10 mg by mouth daily. 04/12/15   [provider]  simvastatin (ZOCOR) 80 MG tablet Take 80  mg by mouth daily. 03/18/15   [provider]  VIIBRYD 40 MG TABS Take 1 tablet by mouth daily. 05/12/15   [provider]    Family History Family History  Problem Relation Age of Onset  . Breast cancer Neg Hx     Social History Social History  Substance Use Topics  . Smoking status: Never Smoker  . Smokeless tobacco: Never Used  . Alcohol use No     Allergies   Patient has no known allergies.   Review of Systems Review of Systems  Constitutional: Negative for fever.  HENT: Positive for ear pain.   Respiratory: Negative for cough, chest tightness and shortness of breath.   Cardiovascular: Positive for leg swelling. Negative for chest pain and palpitations.  Skin: Positive for color change.  Neurological: Positive for dizziness and headaches.  All other systems reviewed and are negative.    Physical Exam Triage Vital Signs ED Triage Vitals [06/08/17 0942]  Enc Vitals Group     BP      Pulse      Resp      Temp      Temp src      SpO2      Weight 230 lb (104.3 kg)     Height  (1.676 m)     Head Circumference  Peak Flow      Pain Score 8     Pain Loc      Pain Edu?      Excl. in GC?    No data found.   Updated Vital Signs BP (!) 150/67 (BP Location: Right Arm)   Pulse 90   Temp 98.8 F (37.1 C) (Oral)   Resp 16   Ht  (1.676 m)   Wt 230 lb (104.3 kg)   SpO2 95%   BMI 37.12 kg/m   Visual Acuity  Physical Exam  Constitutional: She is oriented to person, place, and time. She appears well-developed and well-nourished. She is active and cooperative. No distress.  HENT:  Head: Normocephalic and atraumatic.  Right Ear: Tympanic membrane normal.  Left Ear: Tympanic membrane normal.  Eyes: Conjunctivae are normal. Lids are everted and swept, no foreign bodies found.  Neck: Trachea normal. Neck supple.  Cardiovascular: Normal rate and regular rhythm.   No murmur heard. Pulses:      Dorsalis pedis pulses are 2+ on the  right side, and 2+ on the left side.  Pulmonary/Chest: Effort normal and breath sounds normal. No respiratory distress.  Abdominal: Soft. There is no tenderness.  Musculoskeletal: She exhibits no edema.  Neurological: She is alert and oriented to person, place, and time. GCS eye subscore is 4. GCS verbal subscore is 5. GCS motor subscore is 6.  Skin: Skin is warm and dry. There is erythema.  Bilateral lower leg erythema/edema  Psychiatric: She has a normal mood and affect. Her speech is normal.  Nursing note and vitals reviewed.    UC Treatments / Results  Labs (all labs ordered are listed, but only abnormal results are displayed) Labs Reviewed - No data to display  EKG  EKG Interpretation None       Radiology No results found.  Procedures Procedures (including critical care time)  Medications Ordered in UC Medications - No data to display   Initial Impression / Assessment and Plan / UC Course  I have reviewed the triage vital signs and the nursing notes.  Pertinent labs & imaging results that were available during my care of the patient were reviewed by me and considered in my medical decision making (see chart for details).  Discussed plan of care and recommendation for pt to go to Er now for further evaluation of HA,dizziness, ear pain and leg pain, pt's husband is driving pt and states he called and was able to get in with her PCP Dareen Piano at 130 today, advised pt that PCP may send to ER after OV, pt aware and wishes to start at PCP office.       Final Clinical Impressions(s) / UC Diagnoses   Final diagnoses:  Otalgia of both ears  Right leg pain  Dizzy    New Prescriptions Discharge Medication List as of 06/08/2017 10:12 AM       Controlled Substance Prescriptions    Cannon Quinton, Para March, NP 06/08/17 1053

## 2017-06-10 ENCOUNTER — Emergency Department: Payer: Medicare Other

## 2017-06-10 ENCOUNTER — Inpatient Hospital Stay
Admission: EM | Admit: 2017-06-10 | Discharge: 2017-06-15 | DRG: 064 | Disposition: A | Discharge status: Skilled Nursing Facility | Payer: Medicare Other | Attending: Internal Medicine | Admitting: Internal Medicine

## 2017-06-10 DIAGNOSIS — N179 Acute kidney failure, unspecified: Secondary | ICD-10-CM | POA: Diagnosis present

## 2017-06-10 DIAGNOSIS — R7309 Other abnormal glucose: Secondary | ICD-10-CM | POA: Diagnosis present

## 2017-06-10 DIAGNOSIS — Z90710 Acquired absence of both cervix and uterus: Secondary | ICD-10-CM | POA: Diagnosis not present

## 2017-06-10 DIAGNOSIS — N183 Chronic kidney disease, stage 3 (moderate): Secondary | ICD-10-CM | POA: Diagnosis present

## 2017-06-10 DIAGNOSIS — R2689 Other abnormalities of gait and mobility: Secondary | ICD-10-CM | POA: Diagnosis not present

## 2017-06-10 DIAGNOSIS — R748 Abnormal levels of other serum enzymes: Secondary | ICD-10-CM | POA: Diagnosis not present

## 2017-06-10 DIAGNOSIS — E876 Hypokalemia: Secondary | ICD-10-CM | POA: Diagnosis not present

## 2017-06-10 DIAGNOSIS — R791 Abnormal coagulation profile: Secondary | ICD-10-CM | POA: Diagnosis present

## 2017-06-10 DIAGNOSIS — Z8673 Personal history of transient ischemic attack (TIA), and cerebral infarction without residual deficits: Secondary | ICD-10-CM | POA: Diagnosis not present

## 2017-06-10 DIAGNOSIS — R600 Localized edema: Secondary | ICD-10-CM

## 2017-06-10 DIAGNOSIS — I2510 Atherosclerotic heart disease of native coronary artery without angina pectoris: Secondary | ICD-10-CM | POA: Diagnosis present

## 2017-06-10 DIAGNOSIS — M7989 Other specified soft tissue disorders: Secondary | ICD-10-CM | POA: Diagnosis not present

## 2017-06-10 DIAGNOSIS — Z6836 Body mass index (BMI) 36.0-36.9, adult: Secondary | ICD-10-CM | POA: Diagnosis not present

## 2017-06-10 DIAGNOSIS — E785 Hyperlipidemia, unspecified: Secondary | ICD-10-CM | POA: Diagnosis present

## 2017-06-10 DIAGNOSIS — E669 Obesity, unspecified: Secondary | ICD-10-CM | POA: Diagnosis present

## 2017-06-10 DIAGNOSIS — I214 Non-ST elevation (NSTEMI) myocardial infarction: Secondary | ICD-10-CM | POA: Diagnosis present

## 2017-06-10 DIAGNOSIS — R0902 Hypoxemia: Secondary | ICD-10-CM

## 2017-06-10 DIAGNOSIS — I444 Left anterior fascicular block: Secondary | ICD-10-CM | POA: Diagnosis present

## 2017-06-10 DIAGNOSIS — E039 Hypothyroidism, unspecified: Secondary | ICD-10-CM | POA: Diagnosis present

## 2017-06-10 DIAGNOSIS — I10 Essential (primary) hypertension: Secondary | ICD-10-CM | POA: Diagnosis not present

## 2017-06-10 DIAGNOSIS — G9340 Encephalopathy, unspecified: Secondary | ICD-10-CM | POA: Diagnosis not present

## 2017-06-10 DIAGNOSIS — I5031 Acute diastolic (congestive) heart failure: Secondary | ICD-10-CM | POA: Diagnosis present

## 2017-06-10 DIAGNOSIS — I509 Heart failure, unspecified: Secondary | ICD-10-CM | POA: Diagnosis present

## 2017-06-10 DIAGNOSIS — I130 Hypertensive heart and chronic kidney disease with heart failure and stage 1 through stage 4 chronic kidney disease, or unspecified chronic kidney disease: Secondary | ICD-10-CM | POA: Diagnosis present

## 2017-06-10 DIAGNOSIS — Z79899 Other long term (current) drug therapy: Secondary | ICD-10-CM

## 2017-06-10 DIAGNOSIS — Z23 Encounter for immunization: Secondary | ICD-10-CM | POA: Diagnosis present

## 2017-06-10 DIAGNOSIS — I639 Cerebral infarction, unspecified: Principal | ICD-10-CM | POA: Diagnosis present

## 2017-06-10 DIAGNOSIS — R778 Other specified abnormalities of plasma proteins: Secondary | ICD-10-CM | POA: Diagnosis present

## 2017-06-10 DIAGNOSIS — R7989 Other specified abnormal findings of blood chemistry: Secondary | ICD-10-CM

## 2017-06-10 HISTORY — DX: Unspecified convulsions: R56.9

## 2017-06-10 HISTORY — DX: Unspecified osteoarthritis, unspecified site: M19.90

## 2017-06-10 HISTORY — DX: Hypothyroidism, unspecified: E03.9

## 2017-06-10 HISTORY — DX: Depression, unspecified: F32.A

## 2017-06-10 HISTORY — DX: Traumatic subdural hemorrhage with loss of consciousness status unknown, initial encounter: S06.5XAA

## 2017-06-10 HISTORY — DX: Respiratory tuberculosis unspecified: A15.9

## 2017-06-10 HISTORY — DX: Major depressive disorder, single episode, unspecified: F32.9

## 2017-06-10 HISTORY — DX: Traumatic subdural hemorrhage with loss of consciousness of unspecified duration, initial encounter: S06.5X9A

## 2017-06-10 HISTORY — DX: Chronic kidney disease, stage 3 unspecified: N18.30

## 2017-06-10 HISTORY — DX: Chronic kidney disease, stage 3 (moderate): N18.3

## 2017-06-10 HISTORY — DX: Morbid (severe) obesity due to excess calories: E66.01

## 2017-06-10 LAB — CBC
HEMATOCRIT: 36 % (ref 35.0–47.0)
Hemoglobin: 12.3 g/dL (ref 12.0–16.0)
MCH: 31.2 pg (ref 26.0–34.0)
MCHC: 34.1 g/dL (ref 32.0–36.0)
MCV: 91.4 fL (ref 80.0–100.0)
PLATELETS: 172 10*3/uL (ref 150–440)
RBC: 3.94 MIL/uL (ref 3.80–5.20)
RDW: 13 % (ref 11.5–14.5)
WBC: 7.8 10*3/uL (ref 3.6–11.0)

## 2017-06-10 LAB — URINALYSIS, ROUTINE W REFLEX MICROSCOPIC
BACTERIA UA: NONE SEEN
BILIRUBIN URINE: NEGATIVE
Glucose, UA: NEGATIVE mg/dL
Ketones, ur: NEGATIVE mg/dL
LEUKOCYTES UA: NEGATIVE
Nitrite: NEGATIVE
PH: 5 (ref 5.0–8.0)
PROTEIN: NEGATIVE mg/dL
SQUAMOUS EPITHELIAL / LPF: NONE SEEN
Specific Gravity, Urine: 1.015 (ref 1.005–1.030)

## 2017-06-10 LAB — COMPREHENSIVE METABOLIC PANEL
ALBUMIN: 3.5 g/dL (ref 3.5–5.0)
ALT: 46 U/L (ref 14–54)
AST: 65 U/L — AB (ref 15–41)
Alkaline Phosphatase: 57 U/L (ref 38–126)
Anion gap: 11 (ref 5–15)
BUN: 27 mg/dL — AB (ref 6–20)
CHLORIDE: 100 mmol/L — AB (ref 101–111)
CO2: 30 mmol/L (ref 22–32)
CREATININE: 1.41 mg/dL — AB (ref 0.44–1.00)
Calcium: 9 mg/dL (ref 8.9–10.3)
GFR calc Af Amer: 40 mL/min — ABNORMAL LOW (ref >60)
GFR, EST NON AFRICAN AMERICAN: 35 mL/min — AB (ref >60)
GLUCOSE: 138 mg/dL — AB (ref 65–99)
Potassium: 3.4 mmol/L — ABNORMAL LOW (ref 3.5–5.1)
Sodium: 141 mmol/L (ref 135–145)
Total Bilirubin: 0.7 mg/dL (ref 0.3–1.2)
Total Protein: 7.6 g/dL (ref 6.5–8.1)

## 2017-06-10 LAB — HEPARIN LEVEL (UNFRACTIONATED): HEPARIN UNFRACTIONATED: 0.59 [IU]/mL (ref 0.30–0.70)

## 2017-06-10 LAB — PROTIME-INR
INR: 1.07
PROTHROMBIN TIME: 13.8 s (ref 11.4–15.2)

## 2017-06-10 LAB — CK: Total CK: 183 U/L (ref 38–234)

## 2017-06-10 LAB — FIBRIN DERIVATIVES D-DIMER (ARMC ONLY): FIBRIN DERIVATIVES D-DIMER (ARMC): 2637.34 ng{FEU}/mL — AB (ref 0.00–499.00)

## 2017-06-10 LAB — TROPONIN I
TROPONIN I: 0.93 ng/mL — AB (ref <0.03)
Troponin I: 2.22 ng/mL (ref <0.03)
Troponin I: 2.18 ng/mL (ref <0.03)

## 2017-06-10 LAB — APTT: aPTT: 38 seconds — ABNORMAL HIGH (ref 24–36)

## 2017-06-10 LAB — BRAIN NATRIURETIC PEPTIDE: B NATRIURETIC PEPTIDE 5: 107 pg/mL — AB (ref 0.0–100.0)

## 2017-06-10 MED ORDER — OXYBUTYNIN CHLORIDE ER 10 MG PO TB24
10.0000 mg | ORAL_TABLET | Freq: Every day | ORAL | Status: DC
  Administered 2017-06-10 – 2017-06-15 (×6): 10 mg via ORAL
  Filled 2017-06-10 (×6): qty 1

## 2017-06-10 MED ORDER — LISINOPRIL 20 MG PO TABS
20.0000 mg | ORAL_TABLET | Freq: Every day | ORAL | Status: DC
  Administered 2017-06-10: 20 mg via ORAL

## 2017-06-10 MED ORDER — ASPIRIN EC 81 MG PO TBEC
81.0000 mg | DELAYED_RELEASE_TABLET | Freq: Every day | ORAL | Status: DC
  Administered 2017-06-11 – 2017-06-15 (×5): 81 mg via ORAL
  Filled 2017-06-10 (×5): qty 1

## 2017-06-10 MED ORDER — ONDANSETRON HCL 4 MG/2ML IJ SOLN: 4.0000 mg | Freq: Four times a day (QID) | INTRAMUSCULAR | Status: DC | PRN

## 2017-06-10 MED ORDER — TRAMADOL HCL 50 MG PO TABS: 50.0000 mg | ORAL_TABLET | Freq: Four times a day (QID) | ORAL | Status: DC | PRN

## 2017-06-10 MED ORDER — ATORVASTATIN CALCIUM 20 MG PO TABS: 40.0000 mg | ORAL_TABLET | Freq: Every day | ORAL | Status: DC

## 2017-06-10 MED ORDER — CLONIDINE HCL 0.2 MG/24HR TD PTWK
0.2000 mg | MEDICATED_PATCH | TRANSDERMAL | Status: DC
  Administered 2017-06-10: 0.2 mg via TRANSDERMAL
  Filled 2017-06-10: qty 1

## 2017-06-10 MED ORDER — MIRTAZAPINE 15 MG PO TABS
45.0000 mg | ORAL_TABLET | Freq: Every day | ORAL | Status: DC
  Administered 2017-06-10 – 2017-06-11 (×2): 45 mg via ORAL
  Filled 2017-06-10 (×3): qty 3

## 2017-06-10 MED ORDER — POTASSIUM CHLORIDE CRYS ER 20 MEQ PO TBCR
40.0000 meq | EXTENDED_RELEASE_TABLET | Freq: Once | ORAL | Status: AC
  Administered 2017-06-10: 40 meq via ORAL
  Filled 2017-06-10: qty 2

## 2017-06-10 MED ORDER — ASPIRIN-DIPYRIDAMOLE ER 25-200 MG PO CP12
1.0000 | ORAL_CAPSULE | Freq: Two times a day (BID) | ORAL | Status: DC
  Administered 2017-06-10 – 2017-06-15 (×11): 1 via ORAL
  Filled 2017-06-10 (×12): qty 1

## 2017-06-10 MED ORDER — ATORVASTATIN CALCIUM 20 MG PO TABS
80.0000 mg | ORAL_TABLET | Freq: Every day | ORAL | Status: DC
  Administered 2017-06-10 – 2017-06-14 (×5): 80 mg via ORAL
  Filled 2017-06-10 (×4): qty 4

## 2017-06-10 MED ORDER — HEPARIN (PORCINE) IN NACL 100-0.45 UNIT/ML-% IJ SOLN
1100.0000 [IU]/h | INTRAMUSCULAR | Status: DC
  Administered 2017-06-10 – 2017-06-13 (×5): 1100 [IU]/h via INTRAVENOUS
  Filled 2017-06-10 (×4): qty 250

## 2017-06-10 MED ORDER — POLYETHYLENE GLYCOL 3350 17 G PO PACK: 17.0000 g | PACK | Freq: Every day | ORAL | Status: DC | PRN

## 2017-06-10 MED ORDER — ONDANSETRON HCL 4 MG PO TABS: 4.0000 mg | ORAL_TABLET | Freq: Four times a day (QID) | ORAL | Status: DC | PRN

## 2017-06-10 MED ORDER — ACETAMINOPHEN 325 MG PO TABS
650.0000 mg | ORAL_TABLET | Freq: Four times a day (QID) | ORAL | Status: DC | PRN
  Administered 2017-06-12 – 2017-06-15 (×4): 650 mg via ORAL
  Filled 2017-06-10 (×5): qty 2

## 2017-06-10 MED ORDER — ASPIRIN 81 MG PO CHEW
324.0000 mg | CHEWABLE_TABLET | Freq: Once | ORAL | Status: AC
  Administered 2017-06-10: 324 mg via ORAL
  Filled 2017-06-10: qty 4

## 2017-06-10 MED ORDER — ACETAMINOPHEN 650 MG RE SUPP: 650.0000 mg | Freq: Four times a day (QID) | RECTAL | Status: DC | PRN

## 2017-06-10 MED ORDER — KETOROLAC TROMETHAMINE 30 MG/ML IJ SOLN
15.0000 mg | Freq: Once | INTRAMUSCULAR | Status: AC
  Administered 2017-06-10: 15 mg via INTRAVENOUS
  Filled 2017-06-10: qty 1

## 2017-06-10 MED ORDER — LEVOTHYROXINE SODIUM 88 MCG PO TABS
88.0000 ug | ORAL_TABLET | Freq: Every day | ORAL | Status: DC
  Administered 2017-06-10 – 2017-06-15 (×6): 88 ug via ORAL
  Filled 2017-06-10 (×6): qty 1

## 2017-06-10 MED ORDER — LATANOPROST 0.005 % OP SOLN
1.0000 [drp] | Freq: Every evening | OPHTHALMIC | Status: DC
  Administered 2017-06-10 – 2017-06-14 (×5): 1 [drp] via OPHTHALMIC
  Filled 2017-06-10: qty 2.5

## 2017-06-10 MED ORDER — LISINOPRIL-HYDROCHLOROTHIAZIDE 20-25 MG PO TABS: 1.0000 | ORAL_TABLET | Freq: Every day | ORAL | Status: DC

## 2017-06-10 MED ORDER — HYDROCHLOROTHIAZIDE 25 MG PO TABS
25.0000 mg | ORAL_TABLET | Freq: Every day | ORAL | Status: DC
  Administered 2017-06-10: 25 mg via ORAL

## 2017-06-10 MED ORDER — HEPARIN BOLUS VIA INFUSION
4000.0000 [IU] | Freq: Once | INTRAVENOUS | Status: AC
  Administered 2017-06-10: 4000 [IU] via INTRAVENOUS
  Filled 2017-06-10: qty 4000

## 2017-06-10 MED ORDER — ALBUTEROL SULFATE (2.5 MG/3ML) 0.083% IN NEBU: 2.5000 mg | INHALATION_SOLUTION | RESPIRATORY_TRACT | Status: DC | PRN

## 2017-06-10 NOTE — Progress Notes (Signed)
ANTICOAGULATION CONSULT NOTE - Initial Consult  Pharmacy Consult for Heparin Drip  Indication: chest pain/ACS  No Known Allergies  Patient Measurements: Height:  (167.6 cm) Weight: 240 lb (108.9 kg) IBW/kg (Calculated) : 59.3 Heparin Dosing Weight: 84.56 kg  Vital Signs: Temp: 98.9 F (37.2 C) (10/12 0820) Temp Source: Oral (10/12 0820) BP: 172/90 (10/12 1000) Pulse Rate: 88 (10/12 1018)  Labs:  Recent Labs  06/10/17 0845  HGB 12.3  HCT 36.0  PLT 172  CREATININE 1.41*  TROPONINI 0.93*    Estimated Creatinine Clearance: 41.1 mL/min (A) (by C-G formula based on SCr of 1.41 mg/dL (H)).   Medical History: Past Medical History:  Diagnosis Date  . High cholesterol   . Hypertension   . Stroke [facility])     Medications:  Scheduled:   Infusions:  . heparin 1,100 Units/hr (06/10/17 1001)    Assessment: Pharmacy consulted to dose heparin for this 81 yo female presenting to ED with elevated troponin = 0.93. Patient likely has suffered an NSTEMI leading to new onset lower extremity edema.   Goal of Therapy:  Heparin level 0.3-0.7 units/ml Monitor platelets by anticoagulation protocol: Yes   Plan:  Give 4000 units bolus x 1 Start heparin infusion at 1100 units/hr Check anti-Xa level in 8 hours and daily while on heparin Continue to monitor H&H and platelets   HL ordered for tonight at 18:00.   Joyous Gleghorn K, RPH 06/10/2017,10:47 AM

## 2017-06-10 NOTE — Consult Note (Signed)
Cardiology Consult    Patient ID: YAMNA MACKEL MRN: [id number], DOB/AGE: 79-Mar-1937   Admit date: 06/10/2017 Date of Consult: 06/10/2017  Primary Physician: Lauro Regulus, MD Primary Cardiologist: new - T. Mariah Milling, MD  Requesting Provider: S. Sudini, MD  Patient Profile    Shawna [name] is a 81 y.o. female with a history of CVA, subdural hematoma, HTN, HL, CKD III, and  hypothyroidism, who is being seen today for the evaluation of elevated troponin at the request of Dr. Elpidio Anis.  Past Medical History   Past Medical History:  Diagnosis Date  . CKD (chronic kidney disease), stage III (HCC)   . Depression   . DJD (degenerative joint disease)   . High cholesterol   . Hypertension   . Hypothyroidism   . Morbid obesity (HCC)   . Seizures (HCC)    a. following remote stroke.  . Stroke [facility])    a. 1964-->residual right arm wkns.  Previously on coumadin - pt says for just a few yrs.  . Subdural hematoma (HCC)    a. 10/2001 SDH req temporal frontoparietal craniotomy following fall. ? whether or not pt on coumadin @ time.  Notes indicate yes but pt denies.  . Tuberculosis    a. ~ 1950    Past Surgical History:  Procedure Laterality Date  . ABDOMINAL HYSTERECTOMY    . BRAIN SURGERY    . cataract surgery     2014     Allergies  No Known Allergies  History of Present Illness    81 y/o ? with the above PMH including remote CVA in 1964 with residual left arm wkns, HTN, HL, obesity, hypothyroidism, CKD III, and SDH following fall in 2003 requiring craniotomy.  She has no prior cardiac history.  She lives locally with her husband and though she no longer exercises or drives, she says that she can do her own grocery shopping and is able to walk around the supermarket without any problems, so long as she has a cart to hold onto.  She has no prior history of chest pain or dyspnea.  She was in her usual state of health until about two wks ago, when she began to note mild  lower ext edema and erythema.  She also developed a headache, bilateral earache, and chills.  Though she denies this today, notes from her PCP visit on 10/10 also indicate that she noted progressive weakness.  When she was seen on 10/10, CXR showed mild CHF.  Creat was stable @ 1.3 and K was low @ 2.9.  She was placed on oral lasix and KCl with plan for early outpt f/u.  Lower ext swelling improved some but she was concerned about ongoing erythema and so presented to the ED this AM.  There, ECG showed LAD, LAFB, LVH, and minimal ST elev in II, III.  Trop elevated @ 0.93.  Pt denies chest pain.  CXR shows no acute cardiopulm dzs. Head CT w/o acute intracranial process.  Inpatient Medications    . [START ON 06/11/2017] aspirin EC  81 mg Oral Daily  . atorvastatin  40 mg Oral q1800  . cloNIDine  0.2 mg Transdermal Q7 days  . dipyridamole-aspirin  1 capsule Oral BID  . lisinopril  20 mg Oral Daily   And  . hydrochlorothiazide  25 mg Oral Daily  . latanoprost  1 drop Both Eyes QPM  . levothyroxine  88 mcg Oral Daily  . mirtazapine  45 mg Oral QHS  .  oxybutynin  10 mg Oral Daily    Family History    Family History  Problem Relation Age of Onset  . Heart failure Mother        died @ 77  . Heart attack Father        died @ 70  . Breast cancer Neg Hx     Social History    Social History   Social History  . Marital status: Married    Spouse name: N/A  . Number of children: N/A  . Years of education: N/A   Occupational History  . Not on file.   Social History Main Topics  . Smoking status: Never Smoker  . Smokeless tobacco: Never Used  . Alcohol use No  . Drug use: No  . Sexual activity: Not on file   Other Topics Concern  . Not on file   Social History Narrative   [place].  Active around the house.  Does not routinely exercise or drive.  Does own grocery shopping.     Review of Systems    General:  +++ chills, no fever,  night sweats or weight changes.  Cardiovascular:  No chest pain, dyspnea on exertion, +++ bilat LE edema with erythema, no orthopnea, palpitations, paroxysmal nocturnal dyspnea. Dermatological: No rash, lesions/masses Respiratory: No cough, dyspnea Urologic: No hematuria, dysuria Abdominal:   No nausea, vomiting, diarrhea, bright red blood per rectum, melena, or hematemesis Neurologic:  No visual changes, wkns, changes in mental status. All other systems reviewed and are otherwise negative except as noted above.  Physical Exam    Blood pressure (!) 167/95, pulse 80, temperature 98.9 F (37.2 C), temperature source Oral, resp. rate 20, height  (1.676 m), weight 240 lb (108.9 kg), SpO2 92 %.  General: Pleasant, NAD Psych: Normal affect. Neuro: Alert and oriented X 3. Moves all extremities spontaneously. HEENT: Normal  Neck: Supple without bruits or JVD. Lungs:  Resp regular and unlabored, diminished breath sounds in bilat bases. Heart: RRR no s3, s4, or murmurs. Abdomen: Soft, non-tender, non-distended, BS + x 4.  Extremities: No clubbing, cyanosis.  Trace bilat ankle edema with erythema/mild tenderness. DP/PT/Radials 2+ and equal bilaterally.  Labs     Recent Labs  06/10/17 0845 06/10/17 1320  CKTOTAL  --  183  TROPONINI 0.93* 2.22*   Lab Results  Component Value Date   WBC 7.8 06/10/2017   HGB 12.3 06/10/2017   HCT 36.0 06/10/2017   MCV 91.4 06/10/2017   PLT 172 06/10/2017     Recent Labs Lab 06/10/17 0845  NA 141  K 3.4*  CL 100*  CO2 30  BUN 27*  CREATININE 1.41*  CALCIUM 9.0  PROT 7.6  BILITOT 0.7  ALKPHOS 57  ALT 46  AST 65*  GLUCOSE 138*    Radiology Studies    Dg Chest 2 View  Result Date: 06/10/2017 CLINICAL DATA:  Headache, leg swelling EXAM: CHEST  2 VIEW COMPARISON:  04/07/2010 FINDINGS: There is mild lingular scarring. There is no focal parenchymal opacity. There is no pleural effusion or pneumothorax. The heart and mediastinal contours  are unremarkable. The osseous structures are unremarkable. IMPRESSION: No active cardiopulmonary disease. Electronically Signed   By: Elige Ko   On: 06/10/2017 09:35   Ct Head Wo Contrast  Result Date: 06/10/2017 CLINICAL DATA:  Pt c/o bilateral leg pain 10/10. Both legs are swollen, red, and warm. Pt seen at doctors office yesterday for  same. Pt also c.o headache 10/10. HX stroke and brain surgery. TKV EXAM: CT HEAD WITHOUT CONTRAST TECHNIQUE: Contiguous axial images were obtained from the base of the skull through the vertex without intravenous contrast. COMPARISON:  06/11/2015 FINDINGS: Brain: There changes from a right frontoparietal craniectomy. Focal encephalomalacia is noted of the underlying posterior right frontal and adjacent right parietal lobes. There are no parenchymal masses or mass effect. There is no evidence of a recent infarct. There is no intracranial hemorrhage. There is an old subcortical white matter infarct involving the lateral left frontal lobe. Ventricles are enlarged, to a greater degree than the sulci, similar to the prior CT. No convincing hydrocephalus. No extra-axial masses or abnormal fluid collections. Vascular: No hyperdense vessel or unexpected calcification. Skull: No acute fracture or skull lesion. Sinuses/Orbits: Visualize globes and orbits are unremarkable. Visualized sinuses and mastoid air cells are clear. Other: None. IMPRESSION: 1. No acute intracranial abnormalities. 2. Changes from a previous right frontal parietal craniectomy and resection of a portion of the posterior right frontal lobe adjacent parietal lobes. 3. Small area of old left frontal lobe subcortical white matter infarction. 4. Stable ventriculomegaly without convincing hydrocephalus. Electronically Signed   By: Amie Portland M.D.   On: 06/10/2017 09:24   ECG & Cardiac Imaging    RSR, 87, LAD, LAFB, LVH. Minimal ST elev in II, III.  Assessment & Plan    1.  NSTEMI:  Pt presented with a 2 wk  h/o mild lower ext edema and erythema, headache, and otalgia. She was seen by PCP on 10/10 w/ CXR showing mild CHF.  She was placed on lasix and KCL (K 2.9) with plan for early f/u. She presented to ER this morning due to ongoing lower ext redness and headache.  Found to have mild trop elevation @ 0.93 with subsequent rise to 2.22.  ECG with subtle inferior ST elevation however, she denies any current or previous chest pain/dyspnea.  Agree with heparin, asa, high potency statin.  Add  blocker.  Echo pending.  If echo show WMA and/or trop continues to rise, she will likely require cath on Monday.  2.  Lower ext edema/erythema:  Edema started ~ 2 wks ago.  Lower legs are red.  No skin breakdown.  No fevers @ home but has had chills.  WBC wnl.  Afebrile today. Would have low threshold to treat for cellulitis.  3.  Essential HTN:  BP elevated.  Cont home dose of clonidine.  Adding  blocker.  She is on lisinopril-hctz.  This was actually placed on hold on 10/10 in setting of initiating lasix.  With mild rise in creatinine, will hold here as well.  4.  HL:  LDL 88 in May.  Cont high potency statin rx in the setting of above.  5.  CKD III:  BUN/creat up slightly after being started on lasix 2 days ago.  Baseline creat appears to be 1.2-1.3.  Hold acei/hctz.  Follow.  6.  Hypokalemia:  supp.  Signed, Nicolasa Ducking, NP 06/10/2017, 2:27 PM  For questions or updates, please contact   Please consult [email or web address] for contact info under Cardiology/STEMI.

## 2017-06-10 NOTE — ED Provider Notes (Signed)
[facility] Emergency Department Provider Note  Time seen: 8:49 AM  I have reviewed the triage vital signs and the nursing notes.   HISTORY  Chief Complaint Leg Swelling and Headache    HPI GEARL Shawna [name] is a 81 y.o. female With a past medical history of hypertension, CVA, hyperlipidemia, presents to the emergency department with multiple complaints. According to the patient for the past one week she has had a significant headache along with bilateral ear pain. She also states lower extremity edema which is new for the patient. Patient was seen at the urgent care several days ago and was referred to the ER. Patient states she was able to get an appointment with her doctor 2 days ago so she went there and stand. At that time patient was started on Lasix 40 mg as well as potassium 20 twice daily. Patient states the leg swelling has gone down a little but she continues to have swelling and headache so she came to the emergency department. Describes her headache as moderate to significant. Denies fever. Denies any trouble breathing or chest pain. States moderate leg pain bilaterally.  Past Medical History:  Diagnosis Date  . High cholesterol   . Hypertension   . Stroke [facility])     Patient Active Problem List   Diagnosis Date Noted  . Otalgia of both ears 06/08/2017  . Right leg pain 06/08/2017  . Dizzy 06/08/2017    Past Surgical History:  Procedure Laterality Date  . ABDOMINAL HYSTERECTOMY    . BRAIN SURGERY      Prior to Admission medications   Medication Sig Start Date End Date Taking? Authorizing Provider  cloNIDine (CATAPRES - DOSED IN MG/24 HR) 0.2 mg/24hr patch Place 1 patch onto the skin every 7 (seven) days. 05/12/15   [provider]  dipyridamole-aspirin (AGGRENOX) 200-25 MG 12hr capsule Take 1 capsule by mouth 2 (two) times daily. 05/29/15   [provider]  latanoprost (XALATAN) 0.005 % ophthalmic solution Place 1 drop into both  eyes every evening. 05/29/15   [provider]  levothyroxine (SYNTHROID, LEVOTHROID) 88 MCG tablet Take 1 tablet by mouth daily. 04/24/15   [provider]  lisinopril-hydrochlorothiazide (PRINZIDE,ZESTORETIC) 20-25 MG tablet Take 1 tablet by mouth daily. 04/12/15   [provider]  mirtazapine (REMERON) 45 MG tablet Take 45 mg by mouth at bedtime. 05/29/15   [provider]  oxybutynin (DITROPAN-XL) 10 MG 24 hr tablet Take 10 mg by mouth daily. 04/12/15   [provider]  simvastatin (ZOCOR) 80 MG tablet Take 80 mg by mouth daily. 03/18/15   [provider]  VIIBRYD 40 MG TABS Take 1 tablet by mouth daily. 05/12/15   [provider]    No Known Allergies  Family History  Problem Relation Age of Onset  . Breast cancer Neg Hx     Social History Social History  Substance Use Topics  . Smoking status: Never Smoker  . Smokeless tobacco: Never Used  . Alcohol use No    Review of Systems Constitutional: Negative for fever. Cardiovascular: Negative for chest pain. Respiratory: Negative for shortness of breath. Gastrointestinal: Negative for abdominal pain Musculoskeletal: moderate lower extremity edema bilaterally with moderate leg pain Neurological: positive for headache. No focal weakness or numbness. All other ROS negative  ____________________________________________   PHYSICAL EXAM:  VITAL SIGNS: ED Triage Vitals  Enc Vitals Group     BP 06/10/17 0820 (!) 180/82     Pulse Rate 06/10/17 0820 Marland Kitchen)  104     Resp --      Temp 06/10/17 0820 98.9 F (37.2 C)     Temp Source 06/10/17 0820 Oral     SpO2 06/10/17 0818 94 %     Weight 06/10/17 0820 240 lb (108.9 kg)     Height 06/10/17 0820  (1.676 m)     Head Circumference --      Peak Flow --      Pain Score 06/10/17 0819 10     Pain Loc --      Pain Edu? --      Excl. in GC? --     Constitutional: Alert and oriented. Well appearing and in no distress. Eyes:  Normal exam ENT   Head: Normocephalic and atraumatic.   Mouth/Throat: Mucous membranes are moist. Cardiovascular: Normal rate, regular rhythm. Respiratory: Normal respiratory effort without tachypnea nor retractions. Breath sounds are clear  Gastrointestinal: Soft and nontender. No distention.   Musculoskeletal: moderate lower extremity edema equal bilaterally with mild erythema, mild tenderness of bilateral lower extremities. Neurologic:  Normal speech and language. No gross focal neurologic deficits Skin:  Skin is warm, dry and intact.  Psychiatric: Mood and affect are normal. Speech and behavior are normal.   ____________________________________________    EKG EKG reviewed and interpreted by myself shows normal sinus rhythm at 87 bpm, narrow QRS with a left axis deviation, largely normal intervals with nonspecific ST changes. No ST elevation.  ____________________________________________    RADIOLOGY  chest x-ray negative CT scan of the head shows no acute abnormality.  ____________________________________________   INITIAL IMPRESSION / ASSESSMENT AND PLAN / ED COURSE  Pertinent labs & imaging results that were available during my care of the patient were reviewed by me and considered in my medical decision making (see chart for details).  patient presents to the emergency department with bilateral lower extremity edema, headache, ear pain. Differential this time would include peripheral edema, CHF, ACS, headache, tension headache, migraine headache. We will check labs, given patient's headache with no significant history of headache we'll obtain a CT scan of the head. We will check labs including BNP, obtain a chest x-ray given her new onset peripheral edema. I reviewed the patient's records including her recent urgent care visit as well as recent primary care physician visit. On 06/08/17 patient's labs are largely at her baseline. We will recheck labs and continued close  monitor. We will dose Toradol for headache relief while awaiting labs.  patient's troponin is elevated 0.93. Patient likely has suffered an NSTEMI leading to new onset lower extremity edema. We will start the patient on a heparin drip, dose aspirin and admitted to the hospital was the rest of the patient's workup has resulted.   CRITICAL CARE Performed by: Minna Antis   Total critical care time: 30 minutes  Critical care time was exclusive of separately billable procedures and treating other patients.  Critical care was necessary to treat or prevent imminent or life-threatening deterioration.  Critical care was time spent personally by me on the following activities: development of treatment plan with patient and/or surrogate as well as nursing, discussions with consultants, evaluation of patient's response to treatment, examination of patient, obtaining history from patient or surrogate, ordering and performing treatments and interventions, ordering and review of laboratory studies, ordering and review of radiographic studies, pulse oximetry and re-evaluation of patient's condition.   ____________________________________________   FINAL CLINICAL IMPRESSION(S) / ED DIAGNOSES  peripheral edema Headache NSTEMI   Minna Antis,  MD 06/10/17 1610

## 2017-06-10 NOTE — ED Triage Notes (Signed)
Pt c/o bilateral leg pain 10/10. Both legs are swollen, red, and warm. Pt seen at doctors office yesterday for same. Pt also c.o headache 10/10.

## 2017-06-10 NOTE — ED Notes (Signed)
Patient denies pain and is resting comfortably.  

## 2017-06-10 NOTE — H&P (Signed)
[employer] [facility] - [place] at [facility]   PATIENT NAME: Shawna [name]    MR#:  [id number]  DATE OF BIRTH:  March 14, 1936  DATE OF ADMISSION:  06/10/2017  PRIMARY CARE PHYSICIAN: Lauro Regulus, MD   REQUESTING/REFERRING PHYSICIAN: Dr. Lenard Lance  CHIEF COMPLAINT:   Chief Complaint  Patient presents with  . Leg Swelling  . Headache    HISTORY OF PRESENT ILLNESS:  Shawna [name]  is a 81 y.o. female with a known history of Hypertension, CKD stage III, hyperlipidemia, CVA presents to the emergency room due to lower extremity swelling, headache and fatigue. Patient's symptoms have been going on for 1 week. Started on Lasix by her primary care physician with improved leg swelling. No orthopnea at this time. No chest pain. Here patient has been found to have BNP of 107 with troponin of 0.93. EKG shows no acute ST changes. No history of CHF, CAD.  PAST MEDICAL HISTORY:   Past Medical History:  Diagnosis Date  . High cholesterol   . Hypertension   . Stroke [facility])     PAST SURGICAL HISTORY:   Past Surgical History:  Procedure Laterality Date  . ABDOMINAL HYSTERECTOMY    . BRAIN SURGERY      SOCIAL HISTORY:   Social History  Substance Use Topics  . Smoking status: Never Smoker  . Smokeless tobacco: Never Used  . Alcohol use No    FAMILY HISTORY:   Family History  Problem Relation Age of Onset  . Breast cancer Neg Hx     DRUG ALLERGIES:  No Known Allergies  REVIEW OF SYSTEMS:   Review of Systems  Constitutional: Positive for malaise/fatigue. Negative for chills and fever.  HENT: Negative for sore throat.   Eyes: Negative for blurred vision, double vision and pain.  Respiratory: Negative for cough, hemoptysis, shortness of breath and wheezing.   Cardiovascular: Positive for leg swelling. Negative for chest pain, palpitations and orthopnea.  Gastrointestinal: Negative for abdominal pain, constipation, diarrhea, heartburn, nausea and vomiting.   Genitourinary: Negative for dysuria and hematuria.  Musculoskeletal: Negative for back pain and joint pain.  Skin: Negative for rash.  Neurological: Positive for weakness. Negative for sensory change, speech change, focal weakness and headaches.  Endo/Heme/Allergies: Does not bruise/bleed easily.  Psychiatric/Behavioral: Negative for depression. The patient is not nervous/anxious.     MEDICATIONS AT HOME:   Prior to Admission medications   Medication Sig Start Date End Date Taking? Authorizing Provider  cloNIDine (CATAPRES - DOSED IN MG/24 HR) 0.2 mg/24hr patch Place 1 patch onto the skin every 7 (seven) days. 05/12/15   [provider]  dipyridamole-aspirin (AGGRENOX) 200-25 MG 12hr capsule Take 1 capsule by mouth 2 (two) times daily. 05/29/15   [provider]  latanoprost (XALATAN) 0.005 % ophthalmic solution Place 1 drop into both eyes every evening. 05/29/15   [provider]  levothyroxine (SYNTHROID, LEVOTHROID) 88 MCG tablet Take 1 tablet by mouth daily. 04/24/15   [provider]  lisinopril-hydrochlorothiazide (PRINZIDE,ZESTORETIC) 20-25 MG tablet Take 1 tablet by mouth daily. 04/12/15   [provider]  mirtazapine (REMERON) 45 MG tablet Take 45 mg by mouth at bedtime. 05/29/15   [provider]  oxybutynin (DITROPAN-XL) 10 MG 24 hr tablet Take 10 mg by mouth daily. 04/12/15   [provider]  simvastatin (ZOCOR) 80 MG tablet Take 80 mg by mouth daily. 03/18/15   [provider]  VIIBRYD 40 MG TABS Take 1 tablet by mouth daily. 05/12/15  [provider]     VITAL SIGNS:  Blood pressure (!) 167/95, pulse 80, temperature 98.9 F (37.2 C), temperature source Oral, resp. rate 20, height  (1.676 m), weight 108.9 kg (240 lb), SpO2 92 %.  PHYSICAL EXAMINATION:  Physical Exam  GENERAL:  81 y.o.-year-old patient lying in the bed with no acute distress.  EYES: Pupils equal, round, reactive to light and  accommodation. No scleral icterus. Extraocular muscles intact.  HEENT: Head atraumatic, normocephalic. Oropharynx and nasopharynx clear. No oropharyngeal erythema, moist oral mucosa  NECK:  Supple, no jugular venous distention. No thyroid enlargement, no tenderness.  LUNGS: Normal breath sounds bilaterally, no wheezing, rales, rhonchi. No use of accessory muscles of respiration.  CARDIOVASCULAR: S1, S2 normal. No murmurs, rubs, or gallops.  ABDOMEN: Soft, nontender, nondistended. Bowel sounds present. No organomegaly or mass.  EXTREMITIES: No  cyanosis, or clubbing. + 2 pedal & radial pulses b/l.  Bilateral lower extremity edema with some bruising. No discharge or ulcers noticed. NEUROLOGIC: Cranial nerves II through XII are intact. Left sided weakness PSYCHIATRIC: The patient is alert and oriented x 3. Good affect.  SKIN: No obvious rash, lesion, or ulcer.   LABORATORY PANEL:   CBC  Recent Labs Lab 06/10/17 0845  WBC 7.8  HGB 12.3  HCT 36.0  PLT 172   ------------------------------------------------------------------------------------------------------------------  Chemistries   Recent Labs Lab 06/10/17 0845  NA 141  K 3.4*  CL 100*  CO2 30  GLUCOSE 138*  BUN 27*  CREATININE 1.41*  CALCIUM 9.0  AST 65*  ALT 46  ALKPHOS 57  BILITOT 0.7   ------------------------------------------------------------------------------------------------------------------  Cardiac Enzymes  Recent Labs Lab 06/10/17 0845  TROPONINI 0.93*   ------------------------------------------------------------------------------------------------------------------  RADIOLOGY:  Dg Chest 2 View  Result Date: 06/10/2017 CLINICAL DATA:  Headache, leg swelling EXAM: CHEST  2 VIEW COMPARISON:  04/07/2010 FINDINGS: There is mild lingular scarring. There is no focal parenchymal opacity. There is no pleural effusion or pneumothorax. The heart and mediastinal contours are unremarkable. The osseous  structures are unremarkable. IMPRESSION: No active cardiopulmonary disease. Electronically Signed   By: Elige Ko   On: 06/10/2017 09:35   Ct Head Wo Contrast  Result Date: 06/10/2017 CLINICAL DATA:  Pt c/o bilateral leg pain 10/10. Both legs are swollen, red, and warm. Pt seen at doctors office yesterday for same. Pt also c.o headache 10/10. HX stroke and brain surgery. TKV EXAM: CT HEAD WITHOUT CONTRAST TECHNIQUE: Contiguous axial images were obtained from the base of the skull through the vertex without intravenous contrast. COMPARISON:  06/11/2015 FINDINGS: Brain: There changes from a right frontoparietal craniectomy. Focal encephalomalacia is noted of the underlying posterior right frontal and adjacent right parietal lobes. There are no parenchymal masses or mass effect. There is no evidence of a recent infarct. There is no intracranial hemorrhage. There is an old subcortical white matter infarct involving the lateral left frontal lobe. Ventricles are enlarged, to a greater degree than the sulci, similar to the prior CT. No convincing hydrocephalus. No extra-axial masses or abnormal fluid collections. Vascular: No hyperdense vessel or unexpected calcification. Skull: No acute fracture or skull lesion. Sinuses/Orbits: Visualize globes and orbits are unremarkable. Visualized sinuses and mastoid air cells are clear. Other: None. IMPRESSION: 1. No acute intracranial abnormalities. 2. Changes from a previous right frontal parietal craniectomy and resection of a portion of the posterior right frontal lobe adjacent parietal lobes. 3. Small area of old left frontal lobe subcortical white matter infarction. 4. Stable ventriculomegaly without convincing hydrocephalus.  Electronically Signed   By: Amie Portland M.D.   On: 06/10/2017 09:24     IMPRESSION AND PLAN:   * Non-ST elevation MI It is unclear if this is an acute event or an even that happened a week back when her symptoms started. At this time we  will start her on aspirin, statin, heparin drip. Telemetry floor admission. Check echocardiogram. Discussed with cardiology Dr. Mariah Milling. Further management as per troponin trend and echocardiogram results. No active chest pain at this time.  * Acute congestive heart failure. Patient's lower extremity swelling is improving. No pulmonary edema on chest x-ray. Will check echocardiogram. Continue oral Lasix.  * Hypertension. Patient is on clonidine patch, lisinopril and hydrochlorothiazide which will be continued.  * CKD stage III is stable.  * DVT prophylaxis. On heparin.  All the records are reviewed and case discussed with ED provider. Management plans discussed with the patient, family and they are in agreement.  CODE STATUS: Full code  TOTAL TIME TAKING CARE OF THIS PATIENT: 40 minutes.   Milagros Loll R M.D on 06/10/2017 at 11:54 AM  Between 7am to 6pm - Pager - [phone]  After 6pm go to [email or web address] - password [email or web address] [email or web address]  [facility] [place] [facility]  Office  [phone]  CC: Primary care physician; Lauro Regulus, MD  Note: This dictation was prepared with Dragon dictation along with smaller phrase technology. Any transcriptional errors that result from this process are unintentional.

## 2017-06-10 NOTE — Progress Notes (Signed)
ANTICOAGULATION CONSULT NOTE - Initial Consult  Pharmacy Consult for Heparin Drip  Indication: chest pain/ACS  No Known Allergies  Patient Measurements: Height:  (167.6 cm) Weight: 240 lb (108.9 kg) IBW/kg (Calculated) : 59.3 Heparin Dosing Weight: 84.56 kg  Vital Signs: Temp: 99.1 F (37.3 C) (10/12 1951) Temp Source: Oral (10/12 1951) BP: 129/68 (10/12 1951) Pulse Rate: 87 (10/12 1951)  Labs:  Recent Labs  06/10/17 0845 06/10/17 1320 06/10/17 1826  HGB 12.3  --   --   HCT 36.0  --   --   PLT 172  --   --   APTT 38*  --   --   LABPROT 13.8  --   --   INR 1.07  --   --   HEPARINUNFRC  --   --  0.59  CREATININE 1.41*  --   --   CKTOTAL  --  183  --   TROPONINI 0.93* 2.22* 2.18*    Estimated Creatinine Clearance: 41.1 mL/min (A) (by C-G formula based on SCr of 1.41 mg/dL (H)).   Medical History: Past Medical History:  Diagnosis Date  . CKD (chronic kidney disease), stage III (HCC)   . Depression   . DJD (degenerative joint disease)   . High cholesterol   . Hypertension   . Hypothyroidism   . Morbid obesity (HCC)   . Seizures (HCC)    a. following remote stroke.  . Stroke [facility])    a. 1964-->residual right arm wkns.  Previously on coumadin - pt says for just a few yrs.  . Subdural hematoma (HCC)    a. 10/2001 [facility] req temporal frontoparietal craniotomy following fall. ? whether or not pt on coumadin @ time.  Notes indicate yes but pt denies.  . Tuberculosis    a. ~ 1950    Medications:  Scheduled:  . [START ON 06/11/2017] aspirin EC  81 mg Oral Daily  . atorvastatin  80 mg Oral q1800  . cloNIDine  0.2 mg Transdermal Q7 days  . dipyridamole-aspirin  1 capsule Oral BID  . latanoprost  1 drop Both Eyes QPM  . levothyroxine  88 mcg Oral Daily  . mirtazapine  45 mg Oral QHS  . oxybutynin  10 mg Oral Daily   Infusions:  . heparin 1,100 Units/hr (06/10/17 1001)    Assessment: Pharmacy consulted to dose heparin for this 81 yo female presenting to  ED with elevated troponin = 0.93. Patient likely has suffered an NSTEMI leading to new onset lower extremity edema.   Goal of Therapy:  Heparin level 0.3-0.7 units/ml Monitor platelets by anticoagulation protocol: Yes   Plan:  Continue heparin infusion at current rate and recheck HL in 8 hours.   Valentina Gu, [facility] 06/10/2017,8:25 PM

## 2017-06-10 NOTE — ED Notes (Signed)
Patient transported to CT 

## 2017-06-11 ENCOUNTER — Inpatient Hospital Stay: Payer: Medicare Other

## 2017-06-11 ENCOUNTER — Inpatient Hospital Stay (HOSPITAL_COMMUNITY)
Admit: 2017-06-11 | Discharge: 2017-06-11 | Disposition: A | Discharge status: Home / Self Care | Payer: Medicare Other | Attending: Internal Medicine | Admitting: Internal Medicine

## 2017-06-11 DIAGNOSIS — I214 Non-ST elevation (NSTEMI) myocardial infarction: Secondary | ICD-10-CM

## 2017-06-11 LAB — BASIC METABOLIC PANEL
Anion gap: 12 (ref 5–15)
BUN: 26 mg/dL — AB (ref 6–20)
CO2: 29 mmol/L (ref 22–32)
CREATININE: 1.16 mg/dL — AB (ref 0.44–1.00)
Calcium: 8.9 mg/dL (ref 8.9–10.3)
Chloride: 101 mmol/L (ref 101–111)
GFR, EST AFRICAN AMERICAN: 51 mL/min — AB (ref >60)
GFR, EST NON AFRICAN AMERICAN: 44 mL/min — AB (ref >60)
Glucose, Bld: 137 mg/dL — ABNORMAL HIGH (ref 65–99)
POTASSIUM: 3.3 mmol/L — AB (ref 3.5–5.1)
SODIUM: 142 mmol/L (ref 135–145)

## 2017-06-11 LAB — CBC
HCT: 34.7 % — ABNORMAL LOW (ref 35.0–47.0)
Hemoglobin: 11.7 g/dL — ABNORMAL LOW (ref 12.0–16.0)
MCH: 31 pg (ref 26.0–34.0)
MCHC: 33.7 g/dL (ref 32.0–36.0)
MCV: 92 fL (ref 80.0–100.0)
Platelets: 184 10*3/uL (ref 150–440)
RBC: 3.78 MIL/uL — ABNORMAL LOW (ref 3.80–5.20)
RDW: 13.2 % (ref 11.5–14.5)
WBC: 7.9 10*3/uL (ref 3.6–11.0)

## 2017-06-11 LAB — HEPARIN LEVEL (UNFRACTIONATED): HEPARIN UNFRACTIONATED: 0.6 [IU]/mL (ref 0.30–0.70)

## 2017-06-11 MED ORDER — LISINOPRIL-HYDROCHLOROTHIAZIDE 20-25 MG PO TABS: 1.0000 | ORAL_TABLET | Freq: Every day | ORAL | Status: DC

## 2017-06-11 MED ORDER — INFLUENZA VAC SPLIT HIGH-DOSE 0.5 ML IM SUSY
0.5000 mL | PREFILLED_SYRINGE | INTRAMUSCULAR | Status: DC
  Filled 2017-06-11 (×2): qty 0.5

## 2017-06-11 MED ORDER — POTASSIUM CHLORIDE CRYS ER 20 MEQ PO TBCR
40.0000 meq | EXTENDED_RELEASE_TABLET | ORAL | Status: AC
  Administered 2017-06-11 (×2): 40 meq via ORAL
  Filled 2017-06-11 (×2): qty 2

## 2017-06-11 MED ORDER — SODIUM CHLORIDE 0.9% FLUSH
3.0000 mL | Freq: Two times a day (BID) | INTRAVENOUS | Status: DC
  Administered 2017-06-11 – 2017-06-15 (×3): 3 mL via INTRAVENOUS

## 2017-06-11 MED ORDER — CARVEDILOL 3.125 MG PO TABS: 3.1250 mg | ORAL_TABLET | Freq: Two times a day (BID) | ORAL | Status: DC

## 2017-06-11 MED ORDER — CARVEDILOL 6.25 MG PO TABS
6.2500 mg | ORAL_TABLET | Freq: Two times a day (BID) | ORAL | Status: DC
  Administered 2017-06-11 – 2017-06-12 (×2): 6.25 mg via ORAL
  Filled 2017-06-11 (×3): qty 1

## 2017-06-11 MED ORDER — HYDROCHLOROTHIAZIDE 25 MG PO TABS
25.0000 mg | ORAL_TABLET | Freq: Every day | ORAL | Status: DC
  Administered 2017-06-11 – 2017-06-13 (×3): 25 mg via ORAL
  Filled 2017-06-11 (×3): qty 1

## 2017-06-11 MED ORDER — LISINOPRIL 20 MG PO TABS
20.0000 mg | ORAL_TABLET | Freq: Every day | ORAL | Status: DC
  Administered 2017-06-11 – 2017-06-15 (×5): 20 mg via ORAL
  Filled 2017-06-11 (×5): qty 1

## 2017-06-11 NOTE — Progress Notes (Signed)
[employer] - [place] at [facility]   PATIENT NAME: Shawna [name]    MR#:  [id number]  DATE OF BIRTH:  28-May-1936  SUBJECTIVE:  CHIEF COMPLAINT:   Chief Complaint  Patient presents with  . Leg Swelling  . Headache   Eels weak. No chest pain or shortness of breath. On heparin drip.  REVIEW OF SYSTEMS:    Review of Systems  Constitutional: Positive for malaise/fatigue. Negative for chills and fever.  HENT: Negative for sore throat.   Eyes: Negative for blurred vision, double vision and pain.  Respiratory: Negative for cough, hemoptysis, shortness of breath and wheezing.   Cardiovascular: Positive for leg swelling. Negative for chest pain, palpitations and orthopnea.  Gastrointestinal: Negative for abdominal pain, constipation, diarrhea, heartburn, nausea and vomiting.  Genitourinary: Negative for dysuria and hematuria.  Musculoskeletal: Negative for back pain and joint pain.  Skin: Negative for rash.  Neurological: Positive for weakness. Negative for sensory change, speech change, focal weakness and headaches.  Endo/Heme/Allergies: Does not bruise/bleed easily.  Psychiatric/Behavioral: Negative for depression. The patient is not nervous/anxious.     DRUG ALLERGIES:  No Known Allergies  VITALS:  Blood pressure (!) 170/90, pulse 96, temperature 98.4 F (36.9 C), temperature source Oral, resp. rate 16, height  (1.676 m), weight 98.5 kg (217 lb 3.2 oz), SpO2 91 %.  PHYSICAL EXAMINATION:   Physical Exam  GENERAL:  81 y.o.-year-old patient lying in the bed with no acute distress.  EYES: Pupils equal, round, reactive to light and accommodation. No scleral icterus. Extraocular muscles intact.  HEENT: Head atraumatic, normocephalic. Oropharynx and nasopharynx clear.  NECK:  Supple, no jugular venous distention. No thyroid enlargement, no tenderness.  LUNGS: Normal breath sounds bilaterally, no wheezing, rales, rhonchi. No use of accessory muscles of  respiration.  CARDIOVASCULAR: S1, S2 normal. No murmurs, rubs, or gallops.  ABDOMEN: Soft, nontender, nondistended. Bowel sounds present. No organomegaly or mass.  EXTREMITIES: No cyanosis, clubbing. Bilateral lower extremity edema NEUROLOGIC: Cranial nerves II through XII are intact. No focal Motor or sensory deficits b/l.   PSYCHIATRIC: The patient is alert and oriented x 3.  SKIN: No obvious rash, lesion, or ulcer.   LABORATORY PANEL:   CBC  Recent Labs Lab 06/11/17 0233  WBC 7.9  HGB 11.7*  HCT 34.7*  PLT 184   ------------------------------------------------------------------------------------------------------------------ Chemistries   Recent Labs Lab 06/10/17 0845 06/11/17 0233  NA 141 142  K 3.4* 3.3*  CL 100* 101  CO2 30 29  GLUCOSE 138* 137*  BUN 27* 26*  CREATININE 1.41* 1.16*  CALCIUM 9.0 8.9  AST 65*  --   ALT 46  --   ALKPHOS 57  --   BILITOT 0.7  --    ------------------------------------------------------------------------------------------------------------------  Cardiac Enzymes  Recent Labs Lab 06/10/17 1826  TROPONINI 2.18*   ------------------------------------------------------------------------------------------------------------------  RADIOLOGY:  Dg Chest 2 View  Result Date: 06/10/2017 CLINICAL DATA:  Headache, leg swelling EXAM: CHEST  2 VIEW COMPARISON:  04/07/2010 FINDINGS: There is mild lingular scarring. There is no focal parenchymal opacity. There is no pleural effusion or pneumothorax. The heart and mediastinal contours are unremarkable. The osseous structures are unremarkable. IMPRESSION: No active cardiopulmonary disease. Electronically Signed   By: Elige Ko   On: 06/10/2017 09:35   Ct Head Wo Contrast  Result Date: 06/10/2017 CLINICAL DATA:  Pt c/o bilateral leg pain 10/10. Both legs are swollen, red, and warm. Pt seen at doctors office yesterday for same. Pt also c.o headache 10/10. HX stroke  and brain surgery. TKV  EXAM: CT HEAD WITHOUT CONTRAST TECHNIQUE: Contiguous axial images were obtained from the base of the skull through the vertex without intravenous contrast. COMPARISON:  06/11/2015 FINDINGS: Brain: There changes from a right frontoparietal craniectomy. Focal encephalomalacia is noted of the underlying posterior right frontal and adjacent right parietal lobes. There are no parenchymal masses or mass effect. There is no evidence of a recent infarct. There is no intracranial hemorrhage. There is an old subcortical white matter infarct involving the lateral left frontal lobe. Ventricles are enlarged, to a greater degree than the sulci, similar to the prior CT. No convincing hydrocephalus. No extra-axial masses or abnormal fluid collections. Vascular: No hyperdense vessel or unexpected calcification. Skull: No acute fracture or skull lesion. Sinuses/Orbits: Visualize globes and orbits are unremarkable. Visualized sinuses and mastoid air cells are clear. Other: None. IMPRESSION: 1. No acute intracranial abnormalities. 2. Changes from a previous right frontal parietal craniectomy and resection of a portion of the posterior right frontal lobe adjacent parietal lobes. 3. Small area of old left frontal lobe subcortical white matter infarction. 4. Stable ventriculomegaly without convincing hydrocephalus. Electronically Signed   By: Amie Portland M.D.   On: 06/10/2017 09:24     ASSESSMENT AND PLAN:   * Non-ST elevation MI On aspirin, heparin drip, beta blocker, statin. Echocardiogram done and report pending. Plan is for cardiac catheterization on Monday. No chest pain.  * Acute congestive heart failure. Patient's lower extremity swelling is improving. No pulmonary edema on chest x-ray. Will check echocardiogram. Continue oral Lasix.  * elevated d-dimer. No chest pain or shortness of breath or tachycardia or hypoxia. Unlikely PE. We'll check lower extremity Dopplers.  * Hypertension, ncontrolled. Patient is on  clonidine patch, lisinopril and hydrochlorothiazide. Start Coreg  * CKD stage III is stable.  * DVT prophylaxis. On heparin.  All the records are reviewed and case discussed with Care Management/Social Workerr. Management plans discussed with the patient, family and they are in agreement.  CODE STATUS: FULL CODE  DVT Prophylaxis: SCDs  TOTAL TIME TAKING CARE OF THIS PATIENT: 40 minutes.   POSSIBLE D/C IN 2-3 DAYS, DEPENDING ON CLINICAL CONDITION.  Milagros Loll R M.D on 06/11/2017 at 11:40 AM  Between 7am to 6pm - Pager - [phone]  After 6pm go to [email or web address] - password [email or web address] [facility]  [facility] [place] [facility]  Office  [phone]  CC: Primary care physician; Lauro Regulus, MD  Note: This dictation was prepared with Dragon dictation along with smaller phrase technology. Any transcriptional errors that result from this process are unintentional.

## 2017-06-11 NOTE — Progress Notes (Signed)
ANTICOAGULATION CONSULT NOTE - Initial Consult  Pharmacy Consult for Heparin Drip  Indication: chest pain/ACS  No Known Allergies  Patient Measurements: Height:  (167.6 cm) Weight: 240 lb (108.9 kg) IBW/kg (Calculated) : 59.3 Heparin Dosing Weight: 84.56 kg  Vital Signs: Temp: 99.3 F (37.4 C) (10/13 0445) Temp Source: Oral (10/13 0445) BP: 168/90 (10/13 0445) Pulse Rate: 99 (10/13 0445)  Labs:  Recent Labs  06/10/17 0845 06/10/17 1320 06/10/17 1826 06/11/17 0233  HGB 12.3  --   --  11.7*  HCT 36.0  --   --  34.7*  PLT 172  --   --  184  APTT 38*  --   --   --   LABPROT 13.8  --   --   --   INR 1.07  --   --   --   HEPARINUNFRC  --   --  0.59 0.60  CREATININE 1.41*  --   --  1.16*  CKTOTAL  --  183  --   --   TROPONINI 0.93* 2.22* 2.18*  --     Estimated Creatinine Clearance: 49.9 mL/min (A) (by C-G formula based on SCr of 1.16 mg/dL (H)).   Medical History: Past Medical History:  Diagnosis Date  . CKD (chronic kidney disease), stage III (HCC)   . Depression   . DJD (degenerative joint disease)   . High cholesterol   . Hypertension   . Hypothyroidism   . Morbid obesity (HCC)   . Seizures (HCC)    a. following remote stroke.  . Stroke [facility])    a. 1964-->residual right arm wkns.  Previously on coumadin - pt says for just a few yrs.  . Subdural hematoma (HCC)    a. 10/2001 [facility] req temporal frontoparietal craniotomy following fall. ? whether or not pt on coumadin @ time.  Notes indicate yes but pt denies.  . Tuberculosis    a. ~ 1950    Medications:  Scheduled:  . aspirin EC  81 mg Oral Daily  . atorvastatin  80 mg Oral q1800  . cloNIDine  0.2 mg Transdermal Q7 days  . dipyridamole-aspirin  1 capsule Oral BID  . latanoprost  1 drop Both Eyes QPM  . levothyroxine  88 mcg Oral Daily  . mirtazapine  45 mg Oral QHS  . oxybutynin  10 mg Oral Daily   Infusions:  . heparin 1,100 Units/hr (06/10/17 1001)    Assessment: Pharmacy consulted to dose  heparin for this 81 yo female presenting to ED with elevated troponin = 0.93. Patient likely has suffered an NSTEMI leading to new onset lower extremity edema.   Goal of Therapy:  Heparin level 0.3-0.7 units/ml Monitor platelets by anticoagulation protocol: Yes   Plan:  Continue heparin infusion at current rate and recheck HL in 8 hours.   10/13 @ 0233 HL 0.60 therapeutic. Will continue current rate and will recheck HL/CBC w/ am labs.  Thomasene Ripple, PharmD, BCPS Clinical Pharmacist 06/11/2017

## 2017-06-11 NOTE — Progress Notes (Signed)
Progress Note  Patient Name: Shawna [name] Date of Encounter: 06/11/2017  Primary Cardiologist: New - Dr Mariah Milling  Subjective   No chest pain, shortness of breath,or heart palpitations.  Inpatient Medications    Scheduled Meds: . aspirin EC  81 mg Oral Daily  . atorvastatin  80 mg Oral q1800  . carvedilol  3.125 mg Oral BID WC  . cloNIDine  0.2 mg Transdermal Q7 days  . dipyridamole-aspirin  1 capsule Oral BID  . lisinopril  20 mg Oral Daily   And  . hydrochlorothiazide  25 mg Oral Daily  . latanoprost  1 drop Both Eyes QPM  . levothyroxine  88 mcg Oral Daily  . mirtazapine  45 mg Oral QHS  . oxybutynin  10 mg Oral Daily  . potassium chloride  40 mEq Oral Q4H  . sodium chloride flush  3 mL Intravenous Q12H   Continuous Infusions: . heparin 1,100 Units/hr (06/11/17 0457)   PRN Meds: acetaminophen **OR** acetaminophen, albuterol, ondansetron **OR** ondansetron (ZOFRAN) IV, polyethylene glycol, traMADol   Vital Signs    Vitals:   06/10/17 1951 06/11/17 0445 06/11/17 0920 06/11/17 1214  BP: 129/68 (!) 168/90 (!) 170/90 (!) 166/79  Pulse: 87 99 96 86  Resp: Temp: 99.1 F (37.3 C) 99.3 F (37.4 C) 98.4 F (36.9 C) 98.8 F (37.1 C)  TempSrc: Oral Oral Oral Oral  SpO2: 93% 97% 91% 93%  Weight:  217 lb 3.2 oz (98.5 kg)    Height:        Intake/Output Summary (Last 24 hours) at 06/11/17 1317 Last data filed at 06/11/17 0017  Gross per 24 hour  Intake              120 ml  Output              200 ml  Net              -80 ml   Filed Weights   06/10/17 0820 06/11/17 0445  Weight: 240 lb (108.9 kg) 217 lb 3.2 oz (98.5 kg)    Telemetry    NSR - Personally Reviewed  ECG    Normal sinus rhythm with LVH, left axis deviation - Personally Reviewed  Physical Exam  Pleasant obese, elderly woman in no distress GEN: No acute distress.   Neck: No JVD Cardiac: RRR, no murmurs, rubs, or gallops.  Respiratory: Clear to auscultation bilaterally. GI:  Soft, nontender, non-distended  MS: + bilateral pretibial edema; No deformity. Neuro:  Nonfocal  Psych: Normal affect   Labs    Chemistry Recent Labs Lab 06/10/17 0845 06/11/17 0233  NA 141 142  K 3.4* 3.3*  CL 100* 101  CO2 30 29  GLUCOSE 138* 137*  BUN 27* 26*  CREATININE 1.41* 1.16*  CALCIUM 9.0 8.9  PROT 7.6  --   ALBUMIN 3.5  --   AST 65*  --   ALT 46  --   ALKPHOS 57  --   BILITOT 0.7  --   GFRNONAA 35* 44*  GFRAA 40* 51*  ANIONGAP 11 12     Hematology Recent Labs Lab 06/10/17 0845 06/11/17 0233  WBC 7.8 7.9  RBC 3.94 3.78*  HGB 12.3 11.7*  HCT 36.0 34.7*  MCV 91.4 92.0  MCH 31.2 31.0  MCHC 34.1 33.7  RDW 13.0 13.2  PLT 172 184    Cardiac Enzymes Recent Labs Lab 06/10/17 0845 06/10/17 1320 06/10/17 1826  TROPONINI 0.93* 2.22*  2.18*   No results for input(s): TROPIPOC in the last 168 hours.   BNP Recent Labs Lab 06/10/17 0845  BNP 107.0*     DDimer No results for input(s): DDIMER in the last 168 hours.   Radiology    Dg Chest 2 View  Result Date: 06/10/2017 CLINICAL DATA:  Headache, leg swelling EXAM: CHEST  2 VIEW COMPARISON:  04/07/2010 FINDINGS: There is mild lingular scarring. There is no focal parenchymal opacity. There is no pleural effusion or pneumothorax. The heart and mediastinal contours are unremarkable. The osseous structures are unremarkable. IMPRESSION: No active cardiopulmonary disease. Electronically Signed   By: Elige Ko   On: 06/10/2017 09:35   Ct Head Wo Contrast  Result Date: 06/10/2017 CLINICAL DATA:  Pt c/o bilateral leg pain 10/10. Both legs are swollen, red, and warm. Pt seen at doctors office yesterday for same. Pt also c.o headache 10/10. HX stroke and brain surgery. TKV EXAM: CT HEAD WITHOUT CONTRAST TECHNIQUE: Contiguous axial images were obtained from the base of the skull through the vertex without intravenous contrast. COMPARISON:  06/11/2015 FINDINGS: Brain: There changes from a right frontoparietal  craniectomy. Focal encephalomalacia is noted of the underlying posterior right frontal and adjacent right parietal lobes. There are no parenchymal masses or mass effect. There is no evidence of a recent infarct. There is no intracranial hemorrhage. There is an old subcortical white matter infarct involving the lateral left frontal lobe. Ventricles are enlarged, to a greater degree than the sulci, similar to the prior CT. No convincing hydrocephalus. No extra-axial masses or abnormal fluid collections. Vascular: No hyperdense vessel or unexpected calcification. Skull: No acute fracture or skull lesion. Sinuses/Orbits: Visualize globes and orbits are unremarkable. Visualized sinuses and mastoid air cells are clear. Other: None. IMPRESSION: 1. No acute intracranial abnormalities. 2. Changes from a previous right frontal parietal craniectomy and resection of a portion of the posterior right frontal lobe adjacent parietal lobes. 3. Small area of old left frontal lobe subcortical white matter infarction. 4. Stable ventriculomegaly without convincing hydrocephalus. Electronically Signed   By: Amie Portland M.D.   On: 06/10/2017 09:24   US Venous Img Lower Bilateral  Result Date: 06/11/2017 CLINICAL DATA:  Bilateral lower extremity pain and edema for the past 6 days. Evaluate for DVT. EXAM: BILATERAL LOWER EXTREMITY VENOUS DOPPLER ULTRASOUND TECHNIQUE: Gray-scale sonography with graded compression, as well as color Doppler and duplex ultrasound were performed to evaluate the lower extremity deep venous systems from the level of the common femoral vein and including the common femoral, femoral, profunda femoral, popliteal and calf veins including the posterior tibial, peroneal and gastrocnemius veins when visible. The superficial great saphenous vein was also interrogated. Spectral Doppler was utilized to evaluate flow at rest and with distal augmentation maneuvers in the common femoral, femoral and popliteal veins.  COMPARISON:  None. FINDINGS: RIGHT LOWER EXTREMITY Common Femoral Vein: No evidence of thrombus. Normal compressibility, respiratory phasicity and response to augmentation. Saphenofemoral Junction: No evidence of thrombus. Normal compressibility and flow on color Doppler imaging. Profunda Femoral Vein: No evidence of thrombus. Normal compressibility and flow on color Doppler imaging. Femoral Vein: No evidence of thrombus. Normal compressibility, respiratory phasicity and response to augmentation. Popliteal Vein: No evidence of thrombus. Normal compressibility, respiratory phasicity and response to augmentation. Calf Veins: No evidence of thrombus. Normal compressibility and flow on color Doppler imaging. Superficial Great Saphenous Vein: No evidence of thrombus. Normal compressibility. Venous Reflux:  None. Other Findings:  None. LEFT LOWER EXTREMITY Common Femoral  Vein: No evidence of thrombus. Normal compressibility, respiratory phasicity and response to augmentation. Saphenofemoral Junction: No evidence of thrombus. Normal compressibility and flow on color Doppler imaging. Profunda Femoral Vein: No evidence of thrombus. Normal compressibility and flow on color Doppler imaging. Femoral Vein: No evidence of thrombus. Normal compressibility, respiratory phasicity and response to augmentation. Popliteal Vein: No evidence of thrombus. Normal compressibility, respiratory phasicity and response to augmentation. Calf Veins: No evidence of thrombus. Normal compressibility and flow on color Doppler imaging. Superficial Great Saphenous Vein: No evidence of thrombus. Normal compressibility. Venous Reflux:  None. Other Findings:  None. IMPRESSION: No evidence of DVT within either lower extremity. Electronically Signed   By: Simonne Come M.D.   On: 06/11/2017 12:05    Cardiac Studies   Bilateral venous duplex: Negative for DVT  2-D echocardiogram: Images personally reviewed. Normal LV systolic function. No major valvular  disease. No RV dysfunction or dilatation.  Patient Profile     81 y.o. female presented with headache, leg swelling, and fatigue. Cardiology consulted for elevated troponin.  Assessment & Plan    Elevated troponin: Unclear to me whether this is acute coronary syndrome or demand ischemia. There does not appear to be any significant illness to cause demand ischemia. The patient has had no chest pain or pressure. Her EKG has nonspecific changes and her echocardiogram shows no regional wall motion abnormalities. The patient is on IV heparin and appears to be clinically stable. Her DVT study is negative. Considerations for further diagnostic testing including cardiac catheterization versus a pharmacologic stress study. Discussed options and favor cardiac catheterization for definitive evaluation. I have reviewed the risks, indications, and alternatives to cardiac catheterization, possible angioplasty, and stenting with the patient. Risks include but are not limited to bleeding, infection, vascular injury, stroke, myocardial infection, arrhythmia, kidney injury, radiation-related injury in the case of prolonged fluoroscopy use, emergency cardiac surgery, and death. The patient understands the risks of serious complication is 1-2 in 1000 with diagnostic cardiac cath and 1-2% or less with angioplasty/stenting. The patient and her family would like to discuss things further.If they decide to avoid heart catheterization will arrange a pharmacologic Myoview scan, but otherwise would anticipate cardiac catheterization on Monday. I will discuss further with them tomorrow.  Hypertension: remains uncontrolled, will increase carvedilol to 6.25 mg twice daily  For questions or updates, please contact [facility] Please consult [email or web address] for contact info under Cardiology/STEMI.      Enzo Bi, MD  06/11/2017, 1:17 PM

## 2017-06-12 ENCOUNTER — Inpatient Hospital Stay: Payer: Medicare Other

## 2017-06-12 LAB — ECHOCARDIOGRAM COMPLETE
EERAT: 14.03
EWDT: 278 ms
FS: 6 % — AB (ref 28–44)
Height: 66 in
IV/PV OW: 1.12
LA diam index: 1.65 cm/m2
LA vol A4C: 52.5 ml
LASIZE: 36 mm
LEFT ATRIUM END SYS DIAM: 36 mm
LV E/e'average: 14.03
LV PW d: 12.6 mm — AB (ref 0.6–1.1)
LV TDI E'LATERAL: 7.07
LV TDI E'MEDIAL: 7.07
LV e' LATERAL: 7.07 cm/s
LVEEMED: 14.03
Lateral S' vel: 13.4 cm/s
MV Dec: 278
MV Peak grad: 4 mmHg
MV pk E vel: 99.2 m/s
MVAP: 2.72 cm2
MVPKAVEL: 149 m/s
P 1/2 time: 81 ms
RV TAPSE: 20 mm
WEIGHTICAEL: 3475.2 [oz_av]

## 2017-06-12 LAB — CBC
HEMATOCRIT: 35.5 % (ref 35.0–47.0)
HEMOGLOBIN: 11.9 g/dL — AB (ref 12.0–16.0)
MCH: 31.3 pg (ref 26.0–34.0)
MCHC: 33.6 g/dL (ref 32.0–36.0)
MCV: 93.1 fL (ref 80.0–100.0)
Platelets: 193 10*3/uL (ref 150–440)
RBC: 3.81 MIL/uL (ref 3.80–5.20)
RDW: 12.9 % (ref 11.5–14.5)
WBC: 8.7 10*3/uL (ref 3.6–11.0)

## 2017-06-12 LAB — BLOOD GAS, ARTERIAL
ACID-BASE EXCESS: 6.1 mmol/L — AB (ref 0.0–2.0)
Bicarbonate: 29.6 mmol/L — ABNORMAL HIGH (ref 20.0–28.0)
O2 SAT: 95.8 %
PCO2 ART: 38 mmHg (ref 32.0–48.0)
PO2 ART: 73 mmHg — AB (ref 83.0–108.0)
Patient temperature: 37
pH, Arterial: 7.5 — ABNORMAL HIGH (ref 7.350–7.450)

## 2017-06-12 LAB — GLUCOSE, CAPILLARY
Glucose-Capillary: 146 mg/dL — ABNORMAL HIGH (ref 65–99)
Glucose-Capillary: 120 mg/dL — ABNORMAL HIGH (ref 65–99)

## 2017-06-12 LAB — HEPARIN LEVEL (UNFRACTIONATED): Heparin Unfractionated: 0.61 IU/mL (ref 0.30–0.70)

## 2017-06-12 MED ORDER — POTASSIUM CHLORIDE CRYS ER 20 MEQ PO TBCR
40.0000 meq | EXTENDED_RELEASE_TABLET | Freq: Once | ORAL | Status: AC
  Administered 2017-06-12: 40 meq via ORAL
  Filled 2017-06-12: qty 2

## 2017-06-12 MED ORDER — SODIUM CHLORIDE 0.9 % WEIGHT BASED INFUSION: 1.0000 mL/kg/h | INTRAVENOUS | Status: DC

## 2017-06-12 MED ORDER — INFLUENZA VAC SPLIT HIGH-DOSE 0.5 ML IM SUSY
0.5000 mL | PREFILLED_SYRINGE | INTRAMUSCULAR | Status: DC
  Filled 2017-06-12: qty 0.5

## 2017-06-12 MED ORDER — FUROSEMIDE 20 MG PO TABS
20.0000 mg | ORAL_TABLET | Freq: Every day | ORAL | Status: DC
  Administered 2017-06-12 – 2017-06-15 (×4): 20 mg via ORAL
  Filled 2017-06-12 (×4): qty 1

## 2017-06-12 MED ORDER — SODIUM CHLORIDE 0.9% FLUSH: 3.0000 mL | INTRAVENOUS | Status: DC | PRN

## 2017-06-12 MED ORDER — SODIUM CHLORIDE 0.9 % IV SOLN: 250.0000 mL | INTRAVENOUS | Status: DC | PRN

## 2017-06-12 MED ORDER — SODIUM CHLORIDE 0.9% FLUSH
3.0000 mL | Freq: Two times a day (BID) | INTRAVENOUS | Status: DC
  Administered 2017-06-12 – 2017-06-13 (×2): 3 mL via INTRAVENOUS

## 2017-06-12 MED ORDER — SODIUM CHLORIDE 0.9 % WEIGHT BASED INFUSION
3.0000 mL/kg/h | INTRAVENOUS | Status: DC
  Administered 2017-06-13: 3 mL/kg/h via INTRAVENOUS

## 2017-06-12 MED ORDER — CARVEDILOL 3.125 MG PO TABS
3.1250 mg | ORAL_TABLET | Freq: Two times a day (BID) | ORAL | Status: DC
  Administered 2017-06-13 – 2017-06-15 (×4): 3.125 mg via ORAL
  Filled 2017-06-12 (×5): qty 1

## 2017-06-12 NOTE — Progress Notes (Signed)
RN entered room at 0600 to give medications and noted bleeding from IV site. Hep gtt stopped to further evaluate. IV completely out. Attempts x3 by 3 RN's without success. Reported to oncoming RN at shift change new IV would be needed.

## 2017-06-12 NOTE — Significant Event (Addendum)
Rapid Response Event Note  Overview: called for rapid response 11:05 am, change in mental status decreased loc      Initial Focused Assessment: pt laying in bed vss, drowsy but able to answer all questions appropriately. Dr Elpidio Anis at bedside.   Interventions: Dr Elpidio Anis examined pt, will order ABG and head CT.  Plan of Care (if not transferred): Spoke with RN Christiana Pellant, discussed plan, told to call with changes or if furthur assistance needed.  Event Summary:   at      at          [facility]

## 2017-06-12 NOTE — Progress Notes (Signed)
After the change in mental status this morning, patient is back to baseline.  She is chatting and laughing with her family.  Oriented x4.  Neuro deficits from prior stroke, nothing new.

## 2017-06-12 NOTE — Progress Notes (Signed)
[employer] - [place] [facility]   PATIENT NAME: Vernel Donlan    MR#:  [id number]  DATE OF BIRTH:  1935-09-16  SUBJECTIVE:  CHIEF COMPLAINT:   Chief Complaint  Patient presents with  . Leg Swelling  . Headache   On heparin drip. No CP/SOB  REVIEW OF SYSTEMS:    Review of Systems  Constitutional: Positive for malaise/fatigue. Negative for chills and fever.  HENT: Negative for sore throat.   Eyes: Negative for blurred vision, double vision and pain.  Respiratory: Negative for cough, hemoptysis, shortness of breath and wheezing.   Cardiovascular: Positive for leg swelling. Negative for chest pain, palpitations and orthopnea.  Gastrointestinal: Negative for abdominal pain, constipation, diarrhea, heartburn, nausea and vomiting.  Genitourinary: Negative for dysuria and hematuria.  Musculoskeletal: Negative for back pain and joint pain.  Skin: Negative for rash.  Neurological: Positive for weakness. Negative for sensory change, speech change, focal weakness and headaches.  Endo/Heme/Allergies: Does not bruise/bleed easily.  Psychiatric/Behavioral: Negative for depression. The patient is not nervous/anxious.     DRUG ALLERGIES:  No Known Allergies  VITALS:  Blood pressure (!) 171/81, pulse 77, temperature 98.3 F (36.8 C), temperature source Oral, resp. rate 14, height  (1.676 m), weight 98.6 kg (217 lb 6.4 oz), SpO2 91 %.  PHYSICAL EXAMINATION:   Physical Exam  GENERAL:  81 y.o.-year-old patient lying in the bed with no acute distress.  EYES: Pupils equal, round, reactive to light and accommodation. No scleral icterus. Extraocular muscles intact.  HEENT: Head atraumatic, normocephalic. Oropharynx and nasopharynx clear.  NECK:  Supple, no jugular venous distention. No thyroid enlargement, no tenderness.  LUNGS: Normal breath sounds bilaterally, no wheezing, rales, rhonchi. No use of accessory muscles of respiration.  CARDIOVASCULAR: S1, S2 normal.  No murmurs, rubs, or gallops.  ABDOMEN: Soft, nontender, nondistended. Bowel sounds present. No organomegaly or mass.  EXTREMITIES: No cyanosis, clubbing. Bilateral lower extremity edema NEUROLOGIC: Cranial nerves II through XII are intact. No focal Motor or sensory deficits b/l.   PSYCHIATRIC: The patient is alert and oriented x 3.  SKIN: No obvious rash, lesion, or ulcer.   LABORATORY PANEL:   CBC  Recent Labs Lab 06/12/17 0447  WBC 8.7  HGB 11.9*  HCT 35.5  PLT 193   ------------------------------------------------------------------------------------------------------------------ Chemistries   Recent Labs Lab 06/10/17 0845 06/11/17 0233  NA 141 142  K 3.4* 3.3*  CL 100* 101  CO2 30 29  GLUCOSE 138* 137*  BUN 27* 26*  CREATININE 1.41* 1.16*  CALCIUM 9.0 8.9  AST 65*  --   ALT 46  --   ALKPHOS 57  --   BILITOT 0.7  --    ------------------------------------------------------------------------------------------------------------------  Cardiac Enzymes  Recent Labs Lab 06/10/17 1826  TROPONINI 2.18*   ------------------------------------------------------------------------------------------------------------------  RADIOLOGY:  US Venous Img Lower Bilateral  Result Date: 06/11/2017 CLINICAL DATA:  Bilateral lower extremity pain and edema for the past 6 days. Evaluate for DVT. EXAM: BILATERAL LOWER EXTREMITY VENOUS DOPPLER ULTRASOUND TECHNIQUE: Gray-scale sonography with graded compression, as well as color Doppler and duplex ultrasound were performed to evaluate the lower extremity deep venous systems from the level of the common femoral vein and including the common femoral, femoral, profunda femoral, popliteal and calf veins including the posterior tibial, peroneal and gastrocnemius veins when visible. The superficial great saphenous vein was also interrogated. Spectral Doppler was utilized to evaluate flow at rest and with distal augmentation maneuvers in the  common femoral, femoral and popliteal veins. COMPARISON:  None. FINDINGS: RIGHT LOWER EXTREMITY Common Femoral Vein: No evidence of thrombus. Normal compressibility, respiratory phasicity and response to augmentation. Saphenofemoral Junction: No evidence of thrombus. Normal compressibility and flow on color Doppler imaging. Profunda Femoral Vein: No evidence of thrombus. Normal compressibility and flow on color Doppler imaging. Femoral Vein: No evidence of thrombus. Normal compressibility, respiratory phasicity and response to augmentation. Popliteal Vein: No evidence of thrombus. Normal compressibility, respiratory phasicity and response to augmentation. Calf Veins: No evidence of thrombus. Normal compressibility and flow on color Doppler imaging. Superficial Great Saphenous Vein: No evidence of thrombus. Normal compressibility. Venous Reflux:  None. Other Findings:  None. LEFT LOWER EXTREMITY Common Femoral Vein: No evidence of thrombus. Normal compressibility, respiratory phasicity and response to augmentation. Saphenofemoral Junction: No evidence of thrombus. Normal compressibility and flow on color Doppler imaging. Profunda Femoral Vein: No evidence of thrombus. Normal compressibility and flow on color Doppler imaging. Femoral Vein: No evidence of thrombus. Normal compressibility, respiratory phasicity and response to augmentation. Popliteal Vein: No evidence of thrombus. Normal compressibility, respiratory phasicity and response to augmentation. Calf Veins: No evidence of thrombus. Normal compressibility and flow on color Doppler imaging. Superficial Great Saphenous Vein: No evidence of thrombus. Normal compressibility. Venous Reflux:  None. Other Findings:  None. IMPRESSION: No evidence of DVT within either lower extremity. Electronically Signed   By: Simonne Come M.D.   On: 06/11/2017 12:05     ASSESSMENT AND PLAN:   * Non-ST elevation MI On aspirin, heparin drip, beta blocker, statin. Echocardiogram  done and report pending. Plan is for cardiac catheterization vs Stress test on Monday. No chest pain. Cardiology to discuss with patient and family  * Acute congestive heart failure. Patient's lower extremity swelling is improving. No pulmonary edema on chest x-ray.  Echo result pending Continue oral Lasix.  * Elevated d-dimer. No chest pain or shortness of breath or tachycardia or hypoxia. Unlikely PE.  lower extremity Dopplers negative for PE  * Hypertension, ncontrolled. Patient is on clonidine patch, lisinopril and hydrochlorothiazide. Started Coreg  * CKD stage III is stable.  * DVT prophylaxis. On heparin.  All the records are reviewed and case discussed with Care Management/Social Workerr. Management plans discussed with the patient, family and they are in agreement.  CODE STATUS: FULL CODE  DVT Prophylaxis: SCDs  TOTAL TIME TAKING CARE OF THIS PATIENT: 40 minutes.   POSSIBLE D/C IN 2-3 DAYS, DEPENDING ON CLINICAL CONDITION.  Milagros Loll R M.D on 06/12/2017 at 9:39 AM  Between 7am to 6pm - Pager - [phone]  After 6pm go to [email or web address] - password [email or web address] [facility]  [facility]  Office  [phone]  CC: Primary care physician; Lauro Regulus, MD  Note: This dictation was prepared with Dragon dictation along with smaller phrase technology. Any transcriptional errors that result from this process are unintentional.

## 2017-06-12 NOTE — Progress Notes (Signed)
ANTICOAGULATION CONSULT NOTE - Initial Consult  Pharmacy Consult for Heparin Drip  Indication: chest pain/ACS  No Known Allergies  Patient Measurements: Height:  (167.6 cm) Weight: 217 lb 6.4 oz (98.6 kg) IBW/kg (Calculated) : 59.3 Heparin Dosing Weight: 84.56 kg  Vital Signs: Temp: 98.2 F (36.8 C) (10/14 0424) Temp Source: Oral (10/14 0424) BP: 166/83 (10/14 0424) Pulse Rate: 80 (10/14 0424)  Labs:  Recent Labs  06/10/17 0845 06/10/17 1320 06/10/17 1826 06/11/17 0233 06/12/17 0447  HGB 12.3  --   --  11.7* 11.9*  HCT 36.0  --   --  34.7* 35.5  PLT 172  --   --  184 193  APTT 38*  --   --   --   --   LABPROT 13.8  --   --   --   --   INR 1.07  --   --   --   --   HEPARINUNFRC  --   --  0.59 0.60 0.61  CREATININE 1.41*  --   --  1.16*  --   CKTOTAL  --  183  --   --   --   TROPONINI 0.93* 2.22* 2.18*  --   --     Estimated Creatinine Clearance: 47.3 mL/min (A) (by C-G formula based on SCr of 1.16 mg/dL (H)).   Medical History: Past Medical History:  Diagnosis Date  . CKD (chronic kidney disease), stage III (HCC)   . Depression   . DJD (degenerative joint disease)   . High cholesterol   . Hypertension   . Hypothyroidism   . Morbid obesity (HCC)   . Seizures (HCC)    a. following remote stroke.  . Stroke [facility])    a. 1964-->residual right arm wkns.  Previously on coumadin - pt says for just a few yrs.  . Subdural hematoma (HCC)    a. 10/2001 [facility] req temporal frontoparietal craniotomy following fall. ? whether or not pt on coumadin @ time.  Notes indicate yes but pt denies.  . Tuberculosis    a. ~ 1950    Medications:  Scheduled:  . aspirin EC  81 mg Oral Daily  . atorvastatin  80 mg Oral q1800  . carvedilol  6.25 mg Oral BID WC  . cloNIDine  0.2 mg Transdermal Q7 days  . dipyridamole-aspirin  1 capsule Oral BID  . lisinopril  20 mg Oral Daily   And  . hydrochlorothiazide  25 mg Oral Daily  . Influenza vac split quadrivalent PF  0.5 mL  Intramuscular Tomorrow-1000  . latanoprost  1 drop Both Eyes QPM  . levothyroxine  88 mcg Oral Daily  . mirtazapine  45 mg Oral QHS  . oxybutynin  10 mg Oral Daily  . sodium chloride flush  3 mL Intravenous Q12H   Infusions:  . heparin 1,100 Units/hr (06/12/17 0410)    Assessment: Pharmacy consulted to dose heparin for this 81 yo female presenting to ED with elevated troponin = 0.93. Patient likely has suffered an NSTEMI leading to new onset lower extremity edema.   Goal of Therapy:  Heparin level 0.3-0.7 units/ml Monitor platelets by anticoagulation protocol: Yes   Plan:  Continue heparin infusion at current rate and recheck HL in 8 hours.   10/13 @ 0233 HL 0.60 therapeutic. Will continue current rate and will recheck HL/CBC w/ am labs.  10/14 @ 0447 HL 0.61 therapeutic. Will continue current rate and will recheck HL/CBC w/ am labs.  Thomasene Ripple, PharmD,  BCPS Clinical Pharmacist 06/12/2017

## 2017-06-12 NOTE — Progress Notes (Signed)
Progress Note  Patient Name: Shawna [name] Date of Encounter: 06/12/2017  Primary Cardiologist: Dr Mariah Milling  Subjective   Patient feeling okay.  Episode of altered mental tatus earlier today noted. CT scan was negative for acute finding. The patient has returned from CT and reports no complaints at present. Family is at bedside. Patient specifically denies chest pain or shortness of breath.  Inpatient Medications    Scheduled Meds: . aspirin EC  81 mg Oral Daily  . atorvastatin  80 mg Oral q1800  . carvedilol  6.25 mg Oral BID WC  . cloNIDine  0.2 mg Transdermal Q7 days  . dipyridamole-aspirin  1 capsule Oral BID  . furosemide  20 mg Oral Daily  . lisinopril  20 mg Oral Daily   And  . hydrochlorothiazide  25 mg Oral Daily  . [START ON 06/13/2017] Influenza vac split quadrivalent PF  0.5 mL Intramuscular Tomorrow-1000  . latanoprost  1 drop Both Eyes QPM  . levothyroxine  88 mcg Oral Daily  . oxybutynin  10 mg Oral Daily  . sodium chloride flush  3 mL Intravenous Q12H   Continuous Infusions: . heparin Stopped (06/12/17 1135)   PRN Meds: acetaminophen **OR** acetaminophen, albuterol, ondansetron **OR** ondansetron (ZOFRAN) IV, polyethylene glycol, traMADol   Vital Signs    Vitals:   06/12/17 0820 06/12/17 1111 06/12/17 1111 06/12/17 1123  BP: (!) 171/81  (!) 94/52 (!) 128/56  Pulse: 77  76 79  Resp: 14     Temp: 98.3 F (36.8 C) 98.7 F (37.1 C) 98.3 F (36.8 C)   TempSrc: Oral Axillary Oral   SpO2: 91%  90% 96%  Weight:      Height:        Intake/Output Summary (Last 24 hours) at 06/12/17 1306 Last data filed at 06/11/17 1819  Gross per 24 hour  Intake              372 ml  Output                0 ml  Net              372 ml   Filed Weights   06/10/17 0820 06/11/17 0445 06/12/17 0424  Weight: 240 lb (108.9 kg) 217 lb 3.2 oz (98.5 kg) 217 lb 6.4 oz (98.6 kg)    Telemetry    Sinus rhythm - Personally Reviewed   Physical Exam  Elderly woman,  pleasant, in NAD GEN: No acute distress.   Neck: No JVD Cardiac: RRR, no murmurs, rubs, or gallops.  Respiratory: Clear to auscultation bilaterally. GI: Soft, nontender, non-distended  MS: No edema; No deformity. Neuro:  left-sided weakness, unchanged from baseline Psych: Normal affect   Labs    Chemistry Recent Labs Lab 06/10/17 0845 06/11/17 0233  NA 141 142  K 3.4* 3.3*  CL 100* 101  CO2 30 29  GLUCOSE 138* 137*  BUN 27* 26*  CREATININE 1.41* 1.16*  CALCIUM 9.0 8.9  PROT 7.6  --   ALBUMIN 3.5  --   AST 65*  --   ALT 46  --   ALKPHOS 57  --   BILITOT 0.7  --   GFRNONAA 35* 44*  GFRAA 40* 51*  ANIONGAP 11 12     Hematology Recent Labs Lab 06/10/17 0845 06/11/17 0233 06/12/17 0447  WBC 7.8 7.9 8.7  RBC 3.94 3.78* 3.81  HGB 12.3 11.7* 11.9*  HCT 36.0 34.7* 35.5  MCV 91.4 92.0 93.1  MCH 31.2 31.0 31.3  MCHC 34.1 33.7 33.6  RDW 13.0 13.2 12.9  PLT 172 184 193    Cardiac Enzymes Recent Labs Lab 06/10/17 0845 06/10/17 1320 06/10/17 1826  TROPONINI 0.93* 2.22* 2.18*   No results for input(s): TROPIPOC in the last 168 hours.   BNP Recent Labs Lab 06/10/17 0845  BNP 107.0*     DDimer No results for input(s): DDIMER in the last 168 hours.   Radiology    Ct Head Wo Contrast  Result Date: 06/12/2017 CLINICAL DATA:  Increased left-sided weakness. History of subdural hematoma and stroke EXAM: CT HEAD WITHOUT CONTRAST TECHNIQUE: Contiguous axial images were obtained from the base of the skull through the vertex without intravenous contrast. COMPARISON:  Two days ago FINDINGS: Brain: Moderate gliosis at the right frontal parietal junction. Prominent white matter low density with midly thin appearance of cortex deep to a right parietal bone flap, stable from priors and likely low-grade gliosis. There is dense gliosis in the superficial right temporal lobe, likely posttraumatic. No acute infarct suspected. Remote lacunar infarct in the left caudate head.  Ventriculomegaly that is stable. 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 Chronic microvascular ischemic type change in the cerebral white matter. Vascular: No hyperdense vessel or unexpected calcification. Skull: Previous craniectomy and cranioplasty on the right. No acute or aggressive finding. Sinuses/Orbits: No acute finding.  Bilateral cataract resection. IMPRESSION: 1. No acute finding or change from prior. 2. Right temporal and frontal parietal encephalomalacia. 3. Right parietal cranioplasty, reportedly related to remote subdural hematoma evacuation. 4. Atrophy and ventriculomegaly. Electronically Signed   By: Marnee Spring M.D.   On: 06/12/2017 12:15   US Venous Img Lower Bilateral  Result Date: 06/11/2017 CLINICAL DATA:  Bilateral lower extremity pain and edema for the past 6 days. Evaluate for DVT. EXAM: BILATERAL LOWER EXTREMITY VENOUS DOPPLER ULTRASOUND TECHNIQUE: Gray-scale sonography with graded compression, as well as color Doppler and duplex ultrasound were performed to evaluate the lower extremity deep venous systems from the level of the common femoral vein and including the common femoral, femoral, profunda femoral, popliteal and calf veins including the posterior tibial, peroneal and gastrocnemius veins when visible. The superficial great saphenous vein was also interrogated. Spectral Doppler was utilized to evaluate flow at rest and with distal augmentation maneuvers in the common femoral, femoral and popliteal veins. COMPARISON:  None. FINDINGS: RIGHT LOWER EXTREMITY Common Femoral Vein: No evidence of thrombus. Normal compressibility, respiratory phasicity and response to augmentation. Saphenofemoral Junction: No evidence of thrombus. Normal compressibility and flow on color Doppler imaging. Profunda Femoral Vein: No evidence of thrombus. Normal compressibility and flow on color Doppler imaging. Femoral Vein: No evidence of  thrombus. Normal compressibility, respiratory phasicity and response to augmentation. Popliteal Vein: No evidence of thrombus. Normal compressibility, respiratory phasicity and response to augmentation. Calf Veins: No evidence of thrombus. Normal compressibility and flow on color Doppler imaging. Superficial Great Saphenous Vein: No evidence of thrombus. Normal compressibility. Venous Reflux:  None. Other Findings:  None. LEFT LOWER EXTREMITY Common Femoral Vein: No evidence of thrombus. Normal compressibility, respiratory phasicity and response to augmentation. Saphenofemoral Junction: No evidence of thrombus. Normal compressibility and flow on color Doppler imaging. Profunda Femoral Vein: No evidence of thrombus. Normal compressibility and flow on color Doppler imaging. Femoral Vein: No evidence of thrombus. Normal compressibility, respiratory phasicity and response to augmentation. Popliteal Vein: No evidence of thrombus. Normal compressibility, respiratory phasicity and response to augmentation. Calf Veins: No evidence  of thrombus. Normal compressibility and flow on color Doppler imaging. Superficial Great Saphenous Vein: No evidence of thrombus. Normal compressibility. Venous Reflux:  None. Other Findings:  None. IMPRESSION: No evidence of DVT within either lower extremity. Electronically Signed   By: Simonne Come M.D.   On: 06/11/2017 12:05   Dg Chest Port 1 View  Result Date: 06/12/2017 CLINICAL DATA:  Hypoxia.  Mental status change EXAM: PORTABLE CHEST 1 VIEW COMPARISON:  Two days ago FINDINGS: Chronic borderline heart size. Mild aortic tortuosity. Chronic elevation the left diaphragm with mild scarring. There is no edema, consolidation, effusion, or pneumothorax. IMPRESSION: Stable from prior.  No evidence of active disease. Electronically Signed   By: Marnee Spring M.D.   On: 06/12/2017 12:01    Cardiac Studies   Echo images reviewed: normal LV systolic function. No significant valvular disease.    Patient Profile     81 y.o. female presented with headache, leg swelling, and fatigue. Cardiology consulted for elevated troponin.  Assessment & Plan    1. Elevated troponin/non-STEMI: See note from yesterday. Lengthy discussion regarding further diagnostic options including definitive evaluation with cardiac catheterization versus noninvasive assessment with nuclear stress testing. The patient and family have decided to move forward with cardiac catheterization. Risks and indications of the procedure have been reviewed. We'll plan to proceed tomorrow. I will place the patient on the add-on board. Will continue heparin and current medications.  2. Altered mental status, transient: CT scan reviewed with no acute findings. Possible that hypotension precipitated cerebral hypoperfusion considering her long-standing history of elevated blood pressure. Reduce carvedilol dose back to 3.125 mg twice daily.  For questions or updates, please contact [facility] Please consult [email or web address] for contact info under Cardiology/STEMI.      Enzo Bi, MD  06/12/2017, 1:06 PM

## 2017-06-12 NOTE — Progress Notes (Signed)
CH responded to a PG for an RR. Pt was being assessed by the medical team. Family was bedside. Medical team felt the Pt was in a much better space. CH provided spiritual presence. Family did not indicate a need for spiritual care at this time. CH is available for follow up as needed.    06/12/17 1100  Clinical Encounter Type  Visited With Patient;Patient and family together;Health care provider  Visit Type Initial;Spiritual support;Code (Rapid Response)  Referral From Nurse  Consult/Referral To Chaplain

## 2017-06-12 NOTE — Progress Notes (Addendum)
Rapid response called on the floor due to acute change in patient's mental status. She seems to be more drowsy. Answering appropriately. At baseline no memory problems as per daughter. She was alert and oriented earlier. She has decreased motor strength on the left side from prior CVA.   Blood pressure earlier was 98/52 and presently 126/58. Saturations 96% on oxygen. Pulse 76. On heparin drip. Accu-Chek 120  Pupils unequal but reactive. Left greater than right. Motor strength decreased on left. Sensations intact. Following instructions.  * Acute encephalopathy. Etiology unclear. Will check CT scan of the head to rule out CVA or intracranial bleed. Check ABG. No sedative medications given. Patient likely has mild cognitive impairment at baseline.? Will need MRI if CT scan negative. Hold heparin until we confirm no intracranial bleed.  Discussed with husband and daughter at bedside.  Critical care time spent 35 minutes.

## 2017-06-13 ENCOUNTER — Encounter
Admission: EM | Disposition: A | Discharge status: Skilled Nursing Facility | Payer: Self-pay | Source: Home / Self Care | Attending: Internal Medicine

## 2017-06-13 ENCOUNTER — Inpatient Hospital Stay: Payer: Medicare Other

## 2017-06-13 HISTORY — PX: LEFT HEART CATH AND CORONARY ANGIOGRAPHY: CATH118249

## 2017-06-13 LAB — BASIC METABOLIC PANEL
Anion gap: 11 (ref 5–15)
BUN: 26 mg/dL — AB (ref 6–20)
CO2: 28 mmol/L (ref 22–32)
Calcium: 8.9 mg/dL (ref 8.9–10.3)
Chloride: 98 mmol/L — ABNORMAL LOW (ref 101–111)
Creatinine, Ser: 1.14 mg/dL — ABNORMAL HIGH (ref 0.44–1.00)
GFR calc Af Amer: 52 mL/min — ABNORMAL LOW (ref >60)
GFR, EST NON AFRICAN AMERICAN: 45 mL/min — AB (ref >60)
Glucose, Bld: 121 mg/dL — ABNORMAL HIGH (ref 65–99)
POTASSIUM: 3.7 mmol/L (ref 3.5–5.1)
SODIUM: 137 mmol/L (ref 135–145)

## 2017-06-13 LAB — CBC
HCT: 34.5 % — ABNORMAL LOW (ref 35.0–47.0)
Hemoglobin: 11.8 g/dL — ABNORMAL LOW (ref 12.0–16.0)
MCH: 31.3 pg (ref 26.0–34.0)
MCHC: 34.1 g/dL (ref 32.0–36.0)
MCV: 92 fL (ref 80.0–100.0)
PLATELETS: 225 10*3/uL (ref 150–440)
RBC: 3.75 MIL/uL — AB (ref 3.80–5.20)
RDW: 12.9 % (ref 11.5–14.5)
WBC: 8.7 10*3/uL (ref 3.6–11.0)

## 2017-06-13 LAB — HEPARIN LEVEL (UNFRACTIONATED): Heparin Unfractionated: 0.53 IU/mL (ref 0.30–0.70)

## 2017-06-13 LAB — GLUCOSE, CAPILLARY
GLUCOSE-CAPILLARY: 126 mg/dL — AB (ref 65–99)
Glucose-Capillary: 173 mg/dL — ABNORMAL HIGH (ref 65–99)

## 2017-06-13 SURGERY — LEFT HEART CATH AND CORONARY ANGIOGRAPHY
Anesthesia: Moderate Sedation

## 2017-06-13 MED ORDER — SODIUM CHLORIDE 0.9 % IV SOLN
INTRAVENOUS | Status: AC
  Administered 2017-06-13: 18:00:00 via INTRAVENOUS

## 2017-06-13 MED ORDER — SODIUM CHLORIDE 0.9% FLUSH: 3.0000 mL | INTRAVENOUS | Status: DC | PRN

## 2017-06-13 MED ORDER — INFLUENZA VAC SPLIT HIGH-DOSE 0.5 ML IM SUSY
0.5000 mL | PREFILLED_SYRINGE | INTRAMUSCULAR | Status: AC
  Administered 2017-06-14: 0.5 mL via INTRAMUSCULAR
  Filled 2017-06-13: qty 0.5

## 2017-06-13 MED ORDER — HEPARIN (PORCINE) IN NACL 2-0.9 UNIT/ML-% IJ SOLN
INTRAMUSCULAR | Status: AC
  Filled 2017-06-13: qty 1000

## 2017-06-13 MED ORDER — IOPAMIDOL (ISOVUE-300) INJECTION 61%
INTRAVENOUS | Status: DC | PRN
  Administered 2017-06-13: 45 mL via INTRA_ARTERIAL

## 2017-06-13 MED ORDER — FENTANYL CITRATE (PF) 100 MCG/2ML IJ SOLN
INTRAMUSCULAR | Status: AC
  Filled 2017-06-13: qty 2

## 2017-06-13 MED ORDER — MIRTAZAPINE 15 MG PO TABS
45.0000 mg | ORAL_TABLET | Freq: Every day | ORAL | Status: DC
  Administered 2017-06-13 – 2017-06-14 (×2): 45 mg via ORAL
  Filled 2017-06-13 (×2): qty 3

## 2017-06-13 MED ORDER — SODIUM CHLORIDE 0.9% FLUSH
3.0000 mL | Freq: Two times a day (BID) | INTRAVENOUS | Status: DC
  Administered 2017-06-13 – 2017-06-15 (×4): 3 mL via INTRAVENOUS

## 2017-06-13 MED ORDER — SODIUM CHLORIDE 0.9 % IV SOLN: 250.0000 mL | INTRAVENOUS | Status: DC | PRN

## 2017-06-13 MED ORDER — VERAPAMIL HCL 2.5 MG/ML IV SOLN
INTRAVENOUS | Status: DC | PRN
  Administered 2017-06-13: 2.5 mg via INTRA_ARTERIAL

## 2017-06-13 MED ORDER — VERAPAMIL HCL 2.5 MG/ML IV SOLN
INTRAVENOUS | Status: AC
  Filled 2017-06-13: qty 2

## 2017-06-13 MED ORDER — MIDAZOLAM HCL 2 MG/2ML IJ SOLN
INTRAMUSCULAR | Status: DC | PRN
  Administered 2017-06-13: 1 mg via INTRAVENOUS

## 2017-06-13 MED ORDER — MIDAZOLAM HCL 2 MG/2ML IJ SOLN
INTRAMUSCULAR | Status: AC
  Filled 2017-06-13: qty 2

## 2017-06-13 MED ORDER — HEPARIN SODIUM (PORCINE) 1000 UNIT/ML IJ SOLN
INTRAMUSCULAR | Status: AC
  Filled 2017-06-13: qty 1

## 2017-06-13 MED ORDER — HEPARIN SODIUM (PORCINE) 1000 UNIT/ML IJ SOLN
INTRAMUSCULAR | Status: DC | PRN
  Administered 2017-06-13: 3000 [IU] via INTRAVENOUS

## 2017-06-13 SURGICAL SUPPLY — 7 items
CATH OPTITORQUE JACKY 4.0 5F (CATHETERS) ×2 IMPLANT
DEVICE RAD TR BAND REGULAR (VASCULAR PRODUCTS) ×2 IMPLANT
GLIDESHEATH SLEND SS 6F .021 (SHEATH) ×2 IMPLANT
KIT MANI 3VAL PERCEP (MISCELLANEOUS) ×3 IMPLANT
PACK CARDIAC CATH (CUSTOM PROCEDURE TRAY) ×3 IMPLANT
WIRE HITORQ VERSACORE ST 145CM (WIRE) ×2 IMPLANT
WIRE ROSEN-J .035X260CM (WIRE) ×2 IMPLANT

## 2017-06-13 NOTE — Progress Notes (Signed)
[employer] [facility]   PATIENT NAME: Shawna [name]    MR#:  [id number]  DATE OF BIRTH:  [birth year]  SUBJECTIVE:  CHIEF COMPLAINT:   Chief Complaint  Patient presents with  . Leg Swelling  . Headache   On heparin drip. No CP/SOB Has acute mental status change yesterday and resolved in 1 hr  REVIEW OF SYSTEMS:    Review of Systems  Constitutional: Positive for malaise/fatigue. Negative for chills and fever.  HENT: Negative for sore throat.   Eyes: Negative for blurred vision, double vision and pain.  Respiratory: Negative for cough, hemoptysis, shortness of breath and wheezing.   Cardiovascular: Positive for leg swelling. Negative for chest pain, palpitations and orthopnea.  Gastrointestinal: Negative for abdominal pain, constipation, diarrhea, heartburn, nausea and vomiting.  Genitourinary: Negative for dysuria and hematuria.  Musculoskeletal: Negative for back pain and joint pain.  Skin: Negative for rash.  Neurological: Positive for weakness. Negative for sensory change, speech change, focal weakness and headaches.  Endo/Heme/Allergies: Does not bruise/bleed easily.  Psychiatric/Behavioral: Negative for depression. The patient is not nervous/anxious.     DRUG ALLERGIES:  No Known Allergies  VITALS:  Blood pressure (!) 142/85, pulse 78, temperature 98.2 F (36.8 C), temperature source Oral, resp. rate 18, height  (1.676 m), weight 99.8 kg (220 lb), SpO2 92 %.  PHYSICAL EXAMINATION:   Physical Exam  GENERAL:  81 y.o.-year-old patient lying in the bed with no acute distress.  EYES: Pupils equal, round, reactive to light and accommodation. No scleral icterus. Extraocular muscles intact.  HEENT: Head atraumatic, normocephalic. Oropharynx and nasopharynx clear.  NECK:  Supple, no jugular venous distention. No thyroid enlargement, no tenderness.  LUNGS: Normal breath sounds bilaterally, no wheezing, rales, rhonchi. No use of accessory  muscles of respiration.  CARDIOVASCULAR: S1, S2 normal. No murmurs, rubs, or gallops.  ABDOMEN: Soft, nontender, nondistended. Bowel sounds present. No organomegaly or mass.  EXTREMITIES: No cyanosis, clubbing. Bilateral lower extremity edema NEUROLOGIC: Cranial nerves II through XII are intact. Left weakness PSYCHIATRIC: The patient is alert and oriented x 3.  SKIN: No obvious rash, lesion, or ulcer.   LABORATORY PANEL:   CBC  Recent Labs Lab 06/13/17 0531  WBC 8.7  HGB 11.8*  HCT 34.5*  PLT 225   ------------------------------------------------------------------------------------------------------------------ Chemistries   Recent Labs Lab 06/10/17 0845  06/13/17 0531  NA 141  < > 137  K 3.4*  < > 3.7  CL 100*  < > 98*  CO2 30  < > 28  GLUCOSE 138*  < > 121*  BUN 27*  < > 26*  CREATININE 1.41*  < > 1.14*  CALCIUM 9.0  < > 8.9  AST 65*  --   --   ALT 46  --   --   ALKPHOS 57  --   --   BILITOT 0.7  --   --   < > = values in this interval not displayed. ------------------------------------------------------------------------------------------------------------------  Cardiac Enzymes  Recent Labs Lab 06/10/17 1826  TROPONINI 2.18*   ------------------------------------------------------------------------------------------------------------------  RADIOLOGY:  Ct Head Wo Contrast  Result Date: 06/12/2017 CLINICAL DATA:  Increased left-sided weakness. History of subdural hematoma and stroke EXAM: CT HEAD WITHOUT CONTRAST TECHNIQUE: Contiguous axial images were obtained from the base of the skull through the vertex without intravenous contrast. COMPARISON:  Two days ago FINDINGS: Brain: Moderate gliosis at the right frontal parietal junction. Prominent white matter low density with midly thin appearance of cortex deep to a  right parietal bone flap, stable from priors and likely low-grade gliosis. There is dense gliosis in the superficial right temporal lobe, likely  posttraumatic. No acute infarct suspected. Remote lacunar infarct in the left caudate head. Ventriculomegaly that is stable. 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 Chronic microvascular ischemic type change in the cerebral white matter. Vascular: No hyperdense vessel or unexpected calcification. Skull: Previous craniectomy and cranioplasty on the right. No acute or aggressive finding. Sinuses/Orbits: No acute finding.  Bilateral cataract resection. IMPRESSION: 1. No acute finding or change from prior. 2. Right temporal and frontal parietal encephalomalacia. 3. Right parietal cranioplasty, reportedly related to remote subdural hematoma evacuation. 4. Atrophy and ventriculomegaly. Electronically Signed   By: Marnee Spring M.D.   On: 06/12/2017 12:15   Mr Brain Wo Contrast  Result Date: 06/13/2017 CLINICAL DATA:  New onset headache and fatigue. Presenting to the emergency department 3 days ago. Symptoms for 1 week. Acute change in mental status yesterday. EXAM: MRI HEAD WITHOUT CONTRAST TECHNIQUE: Multiplanar, multiecho pulse sequences of the brain and surrounding structures were obtained without intravenous contrast. COMPARISON:  CT head without contrast 06/12/2017. MRI brain 04/06/2010 FINDINGS: Brain: A punctate acute nonhemorrhagic infarct is noted within the posterior right external capsule. There is no associated hemorrhage. There may be an area of focal restricted diffusion along the left lateral aspect of the corpus callosum in the posterior left frontal lobe. No acute cortical infarct is present. There is no acute hemorrhage or mass lesion. Remote right MCA territory infarcts are present with extensive involvement of the right parietal and to lesser extent temporal lobes. There is ex vacuo dilation associated with these more remote infarcts. Remote lacunar infarcts are again noted in the basal ganglia bilaterally, left greater than right. A  remote lacunar infarct is present in the right thalamus, new since 2011. Remote lacunar infarcts of the brainstem and right cerebellum are stable. Vascular: Flow is present in the major intracranial arteries. Skull and upper cervical spine: The skullbase is within normal limits. The craniocervical junction is normal. Sinuses/Orbits: Mild mucosal thickening is present in the maxillary sinuses bilaterally, worse on the right. There is minimal mucosal thickening within the ethmoid air cells. The mastoid air cells are clear. The remaining paranasal sinuses are clear. IMPRESSION: 1. Two punctate foci of restricted diffusion suggesting acute nonhemorrhagic infarcts involving posterior right external capsule and lateral posterior left corpus callosum. 2. Large remote cortical infarcts in the right MCA territory are stable. 3. Remote lacunar infarcts of the basal ganglia, brainstem, and cerebellum are similar the prior study. A least 1 new right thalamic lacunar infarct is present since 2011. This is not acute. Electronically Signed   By: Marin Roberts M.D.   On: 06/13/2017 11:31   Dg Chest Port 1 View  Result Date: 06/12/2017 CLINICAL DATA:  Hypoxia.  Mental status change EXAM: PORTABLE CHEST 1 VIEW COMPARISON:  Two days ago FINDINGS: Chronic borderline heart size. Mild aortic tortuosity. Chronic elevation the left diaphragm with mild scarring. There is no edema, consolidation, effusion, or pneumothorax. IMPRESSION: Stable from prior.  No evidence of active disease. Electronically Signed   By: Marnee Spring M.D.   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 Patient did not receive TPA. Echocardiogram showed no thrombus. We'll check carotid Dopplers. No dysphagia. Will consult PT. Consult neurology. Aspirin and statin.  * Non-ST elevation  MI On aspirin, heparin drip, beta blocker, statin. Echocardiogram done and  no wall motion abnormalities. Cath today No chest pain.  * Acute congestive heart failure. Patient's lower extremity swelling is improving. No pulmonary edema on chest x-ray.  Echo result pending Continue oral Lasix.   * Elevated d-dimer. No chest pain or shortness of breath or tachycardia or hypoxia. Unlikely PE.  lower extremity Dopplers negative for PE  * Hypertension, ncontrolled. Patient is on clonidine patch, lisinopril and hydrochlorothiazide, coreg  * CKD stage III is stable.  * DVT prophylaxis. On heparin.  All the records are reviewed and case discussed with Care Management/Social Workerr. Management plans discussed with the patient, family and they are in agreement.  CODE STATUS: FULL CODE  DVT Prophylaxis: SCDs  TOTAL TIME TAKING CARE OF THIS PATIENT: 40 minutes.   POSSIBLE D/C IN 1-2 DAYS, DEPENDING ON CLINICAL CONDITION.  Milagros Loll R M.D on 06/13/2017 at 1:06 PM  Between 7am to 6pm - Pager - [phone]  After 6pm go to [email or web address] - password [email or web address] [facility]  [facility]  Office  [phone]  CC: Primary care physician; Lauro Regulus, MD  Note: This dictation was prepared with Dragon dictation along with smaller phrase technology. Any transcriptional errors that result from this process are unintentional.

## 2017-06-13 NOTE — Progress Notes (Signed)
Progress Note  Patient Name: Shawna [name] Date of Encounter: 06/13/2017  Primary Cardiologist: Concha Se, MD   Subjective   No chest pain or sob.  Pt and family would like to proceed w/ cath this AM.  Inpatient Medications    Scheduled Meds: . aspirin EC  81 mg Oral Daily  . atorvastatin  80 mg Oral q1800  . carvedilol  3.125 mg Oral BID WC  . cloNIDine  0.2 mg Transdermal Q7 days  . dipyridamole-aspirin  1 capsule Oral BID  . furosemide  20 mg Oral Daily  . lisinopril  20 mg Oral Daily   And  . hydrochlorothiazide  25 mg Oral Daily  . Influenza vac split quadrivalent PF  0.5 mL Intramuscular Tomorrow-1000  . latanoprost  1 drop Both Eyes QPM  . levothyroxine  88 mcg Oral Daily  . oxybutynin  10 mg Oral Daily  . sodium chloride flush  3 mL Intravenous Q12H  . sodium chloride flush  3 mL Intravenous Q12H   Continuous Infusions: . sodium chloride    . sodium chloride    . heparin 1,100 Units/hr (06/13/17 8657)   PRN Meds: sodium chloride, acetaminophen **OR** acetaminophen, albuterol, ondansetron **OR** ondansetron (ZOFRAN) IV, polyethylene glycol, sodium chloride flush, traMADol   Vital Signs    Vitals:   06/12/17 1741 06/12/17 2030 06/13/17 0340 06/13/17 0757  BP: (!) 160/69 (!) 148/73 (!) 141/78 (!) 149/111  Pulse: 84 83 79 79  Resp:  Temp:  98.3 F (36.8 C) 98.5 F (36.9 C) 98.1 F (36.7 C)  TempSrc:  Oral Oral Oral  SpO2:  91% 93% 97%  Weight:   220 lb (99.8 kg)   Height:        Intake/Output Summary (Last 24 hours) at 06/13/17 0828 Last data filed at 06/13/17 0149  Gross per 24 hour  Intake              240 ml  Output                0 ml  Net              240 ml   Filed Weights   06/11/17 0445 06/12/17 0424 06/13/17 0340  Weight: 217 lb 3.2 oz (98.5 kg) 217 lb 6.4 oz (98.6 kg) 220 lb (99.8 kg)    Physical Exam   GEN: Well nourished, well developed, in no acute distress.  HEENT: Grossly normal.  Neck: Supple, no JVD, carotid  bruits, or masses. Cardiac: RRR, no murmurs, rubs, or gallops. No clubbing, cyanosis, edema.  Radials/DP/PT 2+ and equal bilaterally.  Respiratory:  Respirations regular and unlabored, clear to auscultation bilaterally. GI: Soft, nontender, nondistended, BS + x 4. MS: no deformity or atrophy. Skin: warm and dry, no rash. Neuro:  Strength and sensation are intact. Psych: AAOx3.  Normal affect.  Labs    Chemistry Recent Labs Lab 06/10/17 0845 06/11/17 0233 06/13/17 0531  NA 141 142 137  K 3.4* 3.3* 3.7  CL 100* 101 98*  CO2 GLUCOSE 138* 137* 121*  BUN 27* 26* 26*  CREATININE 1.41* 1.16* 1.14*  CALCIUM 9.0 8.9 8.9  PROT 7.6  --   --   ALBUMIN 3.5  --   --   AST 65*  --   --   ALT 46  --   --   ALKPHOS 57  --   --   BILITOT 0.7  --   --  GFRNONAA 35* 44* 45*  GFRAA 40* 51* 52*  ANIONGAP Hematology Recent Labs Lab 06/11/17 0233 06/12/17 0447 06/13/17 0531  WBC 7.9 8.7 8.7  RBC 3.78* 3.81 3.75*  HGB 11.7* 11.9* 11.8*  HCT 34.7* 35.5 34.5*  MCV 92.0 93.1 92.0  MCH 31.0 31.3 31.3  MCHC 33.7 33.6 34.1  RDW 13.2 12.9 12.9  PLT 184 193 225    Cardiac Enzymes Recent Labs Lab 06/10/17 0845 06/10/17 1320 06/10/17 1826  TROPONINI 0.93* 2.22* 2.18*    BNP Recent Labs Lab 06/10/17 0845  BNP 107.0*      Radiology    Ct Head Wo Contrast  Result Date: 06/12/2017 CLINICAL DATA:  Increased left-sided weakness. History of subdural hematoma and stroke EXAM: CT HEAD WITHOUT CONTRAST TECHNIQUE: Contiguous axial images were obtained from the base of the skull through the vertex without intravenous contrast. COMPARISON:  Two days ago FINDINGS: Brain: Moderate gliosis at the right frontal parietal junction. Prominent white matter low density with midly thin appearance of cortex deep to a right parietal bone flap, stable from priors and likely low-grade gliosis. There is dense gliosis in the superficial right temporal lobe, likely posttraumatic. No  acute infarct suspected. Remote lacunar infarct in the left caudate head. Ventriculomegaly that is stable. 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 Chronic microvascular ischemic type change in the cerebral white matter. Vascular: No hyperdense vessel or unexpected calcification. Skull: Previous craniectomy and cranioplasty on the right. No acute or aggressive finding. Sinuses/Orbits: No acute finding.  Bilateral cataract resection. IMPRESSION: 1. No acute finding or change from prior. 2. Right temporal and frontal parietal encephalomalacia. 3. Right parietal cranioplasty, reportedly related to remote subdural hematoma evacuation. 4. Atrophy and ventriculomegaly. Electronically Signed   By: Marnee Spring M.D.   On: 06/12/2017 12:15   US Venous Img Lower Bilateral  Result Date: 06/11/2017 CLINICAL DATA:  Bilateral lower extremity pain and edema for the past 6 days. Evaluate for DVT. EXAM: BILATERAL LOWER EXTREMITY VENOUS DOPPLER ULTRASOUND TECHNIQUE: Gray-scale sonography with graded compression, as well as color Doppler and duplex ultrasound were performed to evaluate the lower extremity deep venous systems from the level of the common femoral vein and including the common femoral, femoral, profunda femoral, popliteal and calf veins including the posterior tibial, peroneal and gastrocnemius veins when visible. The superficial great saphenous vein was also interrogated. Spectral Doppler was utilized to evaluate flow at rest and with distal augmentation maneuvers in the common femoral, femoral and popliteal veins. COMPARISON:  None. FINDINGS: RIGHT LOWER EXTREMITY Common Femoral Vein: No evidence of thrombus. Normal compressibility, respiratory phasicity and response to augmentation. Saphenofemoral Junction: No evidence of thrombus. Normal compressibility and flow on color Doppler imaging. Profunda Femoral Vein: No evidence of thrombus. Normal  compressibility and flow on color Doppler imaging. Femoral Vein: No evidence of thrombus. Normal compressibility, respiratory phasicity and response to augmentation. Popliteal Vein: No evidence of thrombus. Normal compressibility, respiratory phasicity and response to augmentation. Calf Veins: No evidence of thrombus. Normal compressibility and flow on color Doppler imaging. Superficial Great Saphenous Vein: No evidence of thrombus. Normal compressibility. Venous Reflux:  None. Other Findings:  None. LEFT LOWER EXTREMITY Common Femoral Vein: No evidence of thrombus. Normal compressibility, respiratory phasicity and response to augmentation. Saphenofemoral Junction: No evidence of thrombus. Normal compressibility and flow on color Doppler imaging. Profunda Femoral Vein: No evidence of thrombus. Normal compressibility and flow on color Doppler imaging. Femoral  Vein: No evidence of thrombus. Normal compressibility, respiratory phasicity and response to augmentation. Popliteal Vein: No evidence of thrombus. Normal compressibility, respiratory phasicity and response to augmentation. Calf Veins: No evidence of thrombus. Normal compressibility and flow on color Doppler imaging. Superficial Great Saphenous Vein: No evidence of thrombus. Normal compressibility. Venous Reflux:  None. Other Findings:  None. IMPRESSION: No evidence of DVT within either lower extremity. Electronically Signed   By: Simonne Come M.D.   On: 06/11/2017 12:05   Dg Chest Port 1 View  Result Date: 06/12/2017 CLINICAL DATA:  Hypoxia.  Mental status change EXAM: PORTABLE CHEST 1 VIEW COMPARISON:  Two days ago FINDINGS: Chronic borderline heart size. Mild aortic tortuosity. Chronic elevation the left diaphragm with mild scarring. There is no edema, consolidation, effusion, or pneumothorax. IMPRESSION: Stable from prior.  No evidence of active disease. Electronically Signed   By: Marnee Spring M.D.   On: 06/12/2017 12:01    Telemetry    RSR -  Personally Reviewed  Cardiac Studies   2D Echocardiogram 10.13.2018  Study Conclusions  - Left ventricle: The cavity size was normal. Systolic function was   normal. The estimated ejection fraction was in the range of 55%   to 60%. Wall motion was normal; there were no regional wall   motion abnormalities. Doppler parameters are consistent with   abnormal left ventricular relaxation (grade 1 diastolic   dysfunction). - Left atrium: The atrium was normal in size. - Right ventricle: Systolic function was normal. - Pulmonary arteries: Systolic pressure was within the normal   range.  Patient Profile     81 y.o. female presented with headache, leg swelling, and fatigue. Cardiology consulted for elevated troponin.  Assessment & Plan    1.  Elevated troponin/NSTEMI: no c/p or dyspnea.  Peak trop of 2.22.  ? Demand ischemia vs primary event.  Had c/o weakness @ urgent care visit on Thursday.  Echo w/ nl EF, no rwma.  Dtr @ bedside.  Pt and family wish to proceed to cath today.  The patient understands that risks include but are not limited to stroke (1 in 1000), death (1 in 1000), kidney failure [usually temporary] (1 in 500), bleeding (1 in 200), allergic reaction [possibly serious] (1 in 200), and agrees to proceed.  Cont asa, statin,  blocker, acei.  2.  AMS, transient: stable this AM.  CT w/o acute findings.  MRI ordered.  3.  Essential HTN: trending 140's.  Dropped yesterday with resultant encephalopathy.  Cont current meds/doses this AM.  4.  HL:  LDL 88 in 12/2016.  Cont high potency statin Rx.  5.  CKD III: stable.  Signed, Nicolasa Ducking, NP  06/13/2017, 8:28 AM    For questions or updates, please contact   Please consult [email or web address] for contact info under Cardiology/STEMI.

## 2017-06-13 NOTE — Progress Notes (Signed)
Unsucceful attempt x4 by 2 RN's for 2nd IV access. MD made aware now in cath lab.

## 2017-06-13 NOTE — Progress Notes (Signed)
TR band removed, dressed with gauze and tegaderm. Patient to [place] then to room. RN on floor made aware.

## 2017-06-13 NOTE — Progress Notes (Signed)
ANTICOAGULATION CONSULT NOTE - Initial Consult  Pharmacy Consult for Heparin Drip  Indication: chest pain/ACS  No Known Allergies  Patient Measurements: Height:  (167.6 cm) Weight: 220 lb (99.8 kg) IBW/kg (Calculated) : 59.3 Heparin Dosing Weight: 84.56 kg  Vital Signs: Temp: 98.5 F (36.9 C) (10/15 0340) Temp Source: Oral (10/15 0340) BP: 141/78 (10/15 0340) Pulse Rate: 79 (10/15 0340)  Labs:  Recent Labs  06/10/17 0845 06/10/17 1320  06/10/17 1826 06/11/17 0233 06/12/17 0447 06/13/17 0531  HGB 12.3  --   --   --  11.7* 11.9* 11.8*  HCT 36.0  --   --   --  34.7* 35.5 34.5*  PLT 172  --   --   --  184 193 225  APTT 38*  --   --   --   --   --   --   LABPROT 13.8  --   --   --   --   --   --   INR 1.07  --   --   --   --   --   --   HEPARINUNFRC  --   --   < > 0.59 0.60 0.61 0.53  CREATININE 1.41*  --   --   --  1.16*  --  1.14*  CKTOTAL  --  183  --   --   --   --   --   TROPONINI 0.93* 2.22*  --  2.18*  --   --   --   < > = values in this interval not displayed.  Estimated Creatinine Clearance: 48.5 mL/min (A) (by C-G formula based on SCr of 1.14 mg/dL (H)).   Medical History: Past Medical History:  Diagnosis Date  . CKD (chronic kidney disease), stage III (HCC)   . Depression   . DJD (degenerative joint disease)   . High cholesterol   . Hypertension   . Hypothyroidism   . Morbid obesity (HCC)   . Seizures (HCC)    a. following remote stroke.  . Stroke [facility])    a. 1964-->residual right arm wkns.  Previously on coumadin - pt says for just a few yrs.  . Subdural hematoma (HCC)    a. 10/2001 [facility] req temporal frontoparietal craniotomy following fall. ? whether or not pt on coumadin @ time.  Notes indicate yes but pt denies.  . Tuberculosis    a. ~ 1950    Medications:  Scheduled:  . aspirin EC  81 mg Oral Daily  . atorvastatin  80 mg Oral q1800  . carvedilol  3.125 mg Oral BID WC  . cloNIDine  0.2 mg Transdermal Q7 days  . dipyridamole-aspirin   1 capsule Oral BID  . furosemide  20 mg Oral Daily  . lisinopril  20 mg Oral Daily   And  . hydrochlorothiazide  25 mg Oral Daily  . Influenza vac split quadrivalent PF  0.5 mL Intramuscular Tomorrow-1000  . latanoprost  1 drop Both Eyes QPM  . levothyroxine  88 mcg Oral Daily  . oxybutynin  10 mg Oral Daily  . sodium chloride flush  3 mL Intravenous Q12H  . sodium chloride flush  3 mL Intravenous Q12H   Infusions:  . sodium chloride    . sodium chloride     Followed by  . sodium chloride    . heparin 1,100 Units/hr (06/13/17 0454)    Assessment: Pharmacy consulted to dose heparin for this 81 yo female presenting to ED  with elevated troponin = 0.93. Patient likely has suffered an NSTEMI leading to new onset lower extremity edema.   Goal of Therapy:  Heparin level 0.3-0.7 units/ml Monitor platelets by anticoagulation protocol: Yes   Plan:  Continue heparin infusion at current rate and recheck HL in 8 hours.   10/13 @ 0233 HL 0.60 therapeutic. Will continue current rate and will recheck HL/CBC w/ am labs.  10/14 @ 0447 HL 0.61 therapeutic. Will continue current rate and will recheck HL/CBC w/ am labs.  10/15 @ 0530 HL 0.53 therapeutic. Will continue current rate and will recheck HL w/ am labs.  Thomasene Ripple, PharmD, BCPS Clinical Pharmacist 06/13/2017

## 2017-06-14 ENCOUNTER — Encounter: Payer: Self-pay | Admitting: Cardiovascular Disease

## 2017-06-14 ENCOUNTER — Encounter
Admission: RE | Admit: 2017-06-14 | Discharge: 2017-06-14 | Disposition: A | Discharge status: Home / Self Care | Payer: Medicare Other | Source: Ambulatory Visit | Attending: Internal Medicine | Admitting: Internal Medicine

## 2017-06-14 DIAGNOSIS — I639 Cerebral infarction, unspecified: Principal | ICD-10-CM

## 2017-06-14 DIAGNOSIS — E785 Hyperlipidemia, unspecified: Secondary | ICD-10-CM

## 2017-06-14 LAB — CBC
HCT: 35.5 % (ref 35.0–47.0)
HEMOGLOBIN: 12.1 g/dL (ref 12.0–16.0)
MCH: 31.5 pg (ref 26.0–34.0)
MCHC: 34.2 g/dL (ref 32.0–36.0)
MCV: 92 fL (ref 80.0–100.0)
Platelets: 248 10*3/uL (ref 150–440)
RBC: 3.85 MIL/uL (ref 3.80–5.20)
RDW: 13.1 % (ref 11.5–14.5)
WBC: 8.9 10*3/uL (ref 3.6–11.0)

## 2017-06-14 LAB — HEMOGLOBIN A1C
HEMOGLOBIN A1C: 6.1 % — AB (ref 4.8–5.6)
MEAN PLASMA GLUCOSE: 128.37 mg/dL

## 2017-06-14 LAB — GLUCOSE, CAPILLARY: Glucose-Capillary: 146 mg/dL — ABNORMAL HIGH (ref 65–99)

## 2017-06-14 LAB — LIPID PANEL
Cholesterol: 143 mg/dL (ref 0–200)
HDL: 32 mg/dL — AB (ref >40)
LDL CALC: 80 mg/dL (ref 0–99)
TRIGLYCERIDES: 153 mg/dL — AB (ref <150)
Total CHOL/HDL Ratio: 4.5 RATIO
VLDL: 31 mg/dL (ref 0–40)

## 2017-06-14 NOTE — Evaluation (Signed)
Physical Therapy Evaluation Patient Details Name: Shawna [name] MRN: [id number] DOB: 1935-09-22 Today's Date: 06/14/2017   History of Present Illness  Pt is a 81 y/o F who presented due to LE swelling, headache, fatigue.  Started on Lasix with PCP with improved leg swelling.  Troponin 0.93 in the ED. EKG shows no acute ST changes. Cardiac cath performed on 06/13/17 and per cardiology: "Cardiac catheterization revealed nonobstructive coronary artery disease, suggesting that the elevated troponin is related to supply-demand mismatch." CT with no acute findings.  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  Elevated d-dimer, no chest pain SOB or tachycardia or hypoxia, unlikely PE per Internal Medicine.  LE dopplers negtive for DVT.  Pt's PMH includes CVA, brain surgery, SDH, seizures, tuberculosis.      Clinical Impression  Pt admitted with above diagnosis. Pt currently with functional limitations due to the deficits listed below (see PT Problem List). [name] presents with residual and new L sided weakness and coordination deficits.  PTA she was ambulating with RW.  She currently requires mod assist for transfers and to ambulate 8 ft. Her husband is unable to provide the level of assist the pt currently requires.  Given pt's current mobility status, recommending SNF at d/c.  Pt will benefit from skilled PT to increase their independence and safety with mobility to allow discharge to the venue listed below.      Follow Up Recommendations SNF    Equipment Recommendations  Other (comment) (TBD at next venue of care)    Recommendations for Other Services OT consult     Precautions / Restrictions Precautions Precautions: Fall Restrictions Weight Bearing Restrictions: Yes RUE Weight Bearing: Non weight bearing Other Position/Activity Restrictions: NWB RUE for 48 hrs s/p RUE cardiac cath on 10/15.        Mobility  Bed Mobility               General  bed mobility comments: Pt sitting in chair at start and end of session  Transfers Overall transfer level: Needs assistance Equipment used: 1 person hand held assist Transfers: Sit to/from Stand Sit to Stand: Mod assist         General transfer comment: Cues for hand placement and reminder not to WB through RUE which pt adhered to following.  Mod assist to boost to standing and through HHA with LUE.  Performed sit>stand from chair x2.    Ambulation/Gait Ambulation/Gait assistance: Mod assist;+2 safety/equipment Ambulation Distance (Feet): 8 Feet Assistive device: 1 person hand held assist Gait Pattern/deviations: Decreased step length - right;Decreased step length - left;Decreased dorsiflexion - left;Decreased dorsiflexion - right;Shuffle;Trunk flexed;Wide base of support Gait velocity: decreased Gait velocity interpretation: <1.8 ft/sec, indicative of risk for recurrent falls General Gait Details: Pt initially unable to take steps but was able to after performing lateral weight shifts in standing.  Pt with very short step length Bil, dec DF Bil, and requires assist to steady while ambulating.  She fatigues quickly and requires motivation to ambulate 8 ft before sitting.  Close chair follow when ambulating in room.   Stairs            Wheelchair Mobility    Modified Rankin (Stroke Patients Only) Modified Rankin (Stroke Patients Only) Pre-Morbid Rankin Score: Moderate disability Modified Rankin: Moderately severe disability     Balance Overall balance assessment: Needs assistance;History of Falls Sitting-balance support: No upper extremity supported;Feet supported Sitting balance-Leahy Scale: Fair     Standing balance  support: Single extremity supported;During functional activity Standing balance-Leahy Scale: Poor Standing balance comment: Pt relies on UE support for static and dynamic activities                             Pertinent Vitals/Pain Pain  Assessment: Faces Faces Pain Scale: Hurts even more Pain Location: low back (which pt attributes to the hospital bed) Pain Descriptors / Indicators: Aching;Discomfort Pain Intervention(s): Limited activity within patient's tolerance;Monitored during session;Repositioned    Home Living Family/patient expects to be discharged to:: Skilled nursing facility Living Arrangements: Spouse/significant other Available Help at Discharge: Family;Available 24 hours/day (see additional comments below) Type of Home: House Home Access: Level entry     Home Layout: One level Home Equipment: Walker - 2 wheels;Cane - single point;Hand held shower head;Grab bars - tub/shower;Shower seat - built in Additional Comments: Pt will have 24/7 supervision available from her husband but he is unable to provide level of physical assist the pt currently requires    Prior Function Level of Independence: Needs assistance   Gait / Transfers Assistance Needed: Pt ambualted with RW at all times, very occiassionally with cane.  1 fall in the past 6 months.    ADL's / Homemaking Assistance Needed: Pt independent with bathing, dressing.  Assists with light cooking, cleaning.  Husband does the driving.         Hand Dominance   Dominant Hand: Right    Extremity/Trunk Assessment   Upper Extremity Assessment Upper Extremity Assessment: RUE deficits/detail;LUE deficits/detail RUE Deficits / Details: Strength grossly 3/5 RUE Sensation:  (WNL) LUE Deficits / Details: Strength grossly 3-/5 LUE Sensation:  (WNL) LUE Coordination: decreased fine motor    Lower Extremity Assessment Lower Extremity Assessment: RLE deficits/detail;LLE deficits/detail RLE Deficits / Details: RLE strength grossly 4-/5 RLE Sensation:  (WNL) RLE Coordination:  (WNL) LLE Deficits / Details: DF 2+/5, knee E 3/5, hip strength grossly 3/5.  Pt reports she is more weak than her baseline L sided weakness.  LLE Sensation: decreased light touch  (inconsistent report from pt with dermatomal testing) LLE Coordination: decreased gross motor    Cervical / Trunk Assessment Cervical / Trunk Assessment: Other exceptions Cervical / Trunk Exceptions: Back pain which pt attributes to time spent in hospital bed  Communication   Communication: Other (comment) (Mild dysarthria)  Cognition Arousal/Alertness: Awake/alert Behavior During Therapy: WFL for tasks assessed/performed Overall Cognitive Status: Within Functional Limits for tasks assessed                                        General Comments General comments (skin integrity, edema, etc.): BP in sitting at start of session 150/63.  Daughter present during Evaluation.      Exercises Other Exercises Other Exercises: Lateral weigt shifts in standing with cues and facilitation. x5 each LE   Assessment/Plan    PT Assessment Patient needs continued PT services  PT Problem List Decreased strength;Decreased range of motion;Decreased activity tolerance;Decreased balance;Decreased mobility;Decreased coordination;Decreased knowledge of use of DME;Decreased safety awareness;Pain       PT Treatment Interventions DME instruction;Gait training;Stair training;Functional mobility training;Therapeutic activities;Therapeutic exercise;Balance training;Neuromuscular re-education;Patient/family education;Wheelchair mobility training;Modalities    PT Goals (Current goals can be found in the Care Plan section)  Acute Rehab PT Goals Patient Stated Goal: rehab before home PT Goal Formulation: With patient Time For Goal Achievement: 06/28/17 Potential  to Achieve Goals: Fair    Frequency 7X/week   Barriers to discharge Decreased caregiver support Husband unable to provide level of assist the pt currently requires    Co-evaluation               AM-PAC PT "6 Clicks" Daily Activity  Outcome Measure Difficulty turning over in bed (including adjusting bedclothes, sheets and  blankets)?: Unable Difficulty moving from lying on back to sitting on the side of the bed? : Unable Difficulty sitting down on and standing up from a chair with arms (e.g., wheelchair, bedside commode, etc,.)?: Unable Help needed moving to and from a bed to chair (including a wheelchair)?: A Lot Help needed walking in hospital room?: A Lot Help needed climbing 3-5 steps with a railing? : Total 6 Click Score: 8    End of Session Equipment Utilized During Treatment: Gait belt Activity Tolerance: Patient tolerated treatment well;Patient limited by fatigue Patient left: in chair;with call bell/phone within reach;with chair alarm set;with family/visitor present Nurse Communication: Mobility status;Weight bearing status;Precautions PT Visit Diagnosis: Hemiplegia and hemiparesis;Pain;Unsteadiness on feet (R26.81);Other abnormalities of gait and mobility (R26.89);Muscle weakness (generalized) (M62.81);History of falling (Z91.81) Hemiplegia - Right/Left: Left Hemiplegia - dominant/non-dominant: Non-dominant Hemiplegia - caused by:  (residual and new cerebral infarction) Pain - Right/Left:  (central and lower) Pain - part of body:  (back)    Time: 1610-9604 PT Time Calculation (min) (ACUTE ONLY): 40 min   Charges:   PT Evaluation $PT Eval Moderate Complexity: 1 Mod PT Treatments $Gait Training: 8-22 mins $Therapeutic Activity: 8-22 mins   PT G Codes:   PT G-Codes **NOT FOR INPATIENT CLASS** Functional Assessment Tool Used: AM-PAC 6 Clicks Basic Mobility;Clinical judgement Functional Limitation: Mobility: Walking and moving around Mobility: Walking and Moving Around Current Status (V4098): At least 80 percent but less than 100 percent impaired, limited or restricted Mobility: Walking and Moving Around Goal Status [phone]): At least 60 percent but less than 80 percent impaired, limited or restricted    Encarnacion Chu PT, DPT 06/14/2017, 12:44 PM

## 2017-06-14 NOTE — Progress Notes (Signed)
Patient's daughter calling this  Nurse stating that patient "almost had a syncopal episode" while siting on BSC. When assessing the pt, pt denied any chest pain, pt remain alert and oriented, pt was a little sweaty but glucose was 173. Pt admitted that she was straining while having a BM. Pt was assisted while using the [facility] and was helped back in bed. Will continue to monitor.

## 2017-06-14 NOTE — Progress Notes (Signed)
Progress Note  Patient Name: Shawna [name] Date of Encounter: 06/14/2017  Primary Cardiologist: Concha Se, MD   Subjective   Feels much better this morning. No chest pain or SOB. No confusion. Underwent LHC on 10/15 that showed nonobstructive CAD with medical management advised.   Inpatient Medications    Scheduled Meds: . aspirin EC  81 mg Oral Daily  . atorvastatin  80 mg Oral q1800  . carvedilol  3.125 mg Oral BID WC  . cloNIDine  0.2 mg Transdermal Q7 days  . dipyridamole-aspirin  1 capsule Oral BID  . furosemide  20 mg Oral Daily  . Influenza vac split quadrivalent PF  0.5 mL Intramuscular Tomorrow-1000  . latanoprost  1 drop Both Eyes QPM  . levothyroxine  88 mcg Oral Daily  . lisinopril  20 mg Oral Daily  . mirtazapine  45 mg Oral QHS  . oxybutynin  10 mg Oral Daily  . sodium chloride flush  3 mL Intravenous Q12H  . sodium chloride flush  3 mL Intravenous Q12H   Continuous Infusions: . sodium chloride     PRN Meds: sodium chloride, acetaminophen **OR** acetaminophen, albuterol, ondansetron **OR** ondansetron (ZOFRAN) IV, polyethylene glycol, sodium chloride flush, traMADol   Vital Signs    Vitals:   06/13/17 2100 06/14/17 0500 06/14/17 0522 06/14/17 0807  BP: (!) 154/73  (!) 157/67 125/70  Pulse:   83 86  Resp: 17   16  Temp: 98 F (36.7 C)  99 F (37.2 C) 98.7 F (37.1 C)  TempSrc: Oral  Oral Oral  SpO2: 94%  94% 91%  Weight:  227 lb (103 kg)    Height:        Intake/Output Summary (Last 24 hours) at 06/14/17 0912 Last data filed at 06/14/17 0911  Gross per 24 hour  Intake                0 ml  Output              801 ml  Net             -801 ml   Filed Weights   06/13/17 0340 06/13/17 1130 06/14/17 0500  Weight: 220 lb (99.8 kg) 220 lb (99.8 kg) 227 lb (103 kg)    Physical Exam   GEN: Well nourished, well developed, in no acute distress.  HEENT: Grossly normal.  Neck: Supple, no JVD, carotid bruits, or masses. Cardiac: RRR, no  murmurs, rubs, or gallops. No clubbing, cyanosis, edema.  Radials/DP/PT 2+ and equal bilaterally. Cardiac cath site without bleeding, bruising, swelling, or erythema. Pulse 2+.  Respiratory:  Respirations regular and unlabored, clear to auscultation bilaterally. GI: Soft, nontender, nondistended, BS + x 4. MS: no deformity or atrophy. Skin: warm and dry, no rash. Neuro:  Strength and sensation are intact. Psych: AAOx3.  Normal affect.  Labs    Chemistry  Recent Labs Lab 06/10/17 0845 06/11/17 0233 06/13/17 0531  NA 141 142 137  K 3.4* 3.3* 3.7  CL 100* 101 98*  CO2 GLUCOSE 138* 137* 121*  BUN 27* 26* 26*  CREATININE 1.41* 1.16* 1.14*  CALCIUM 9.0 8.9 8.9  PROT 7.6  --   --   ALBUMIN 3.5  --   --   AST 65*  --   --   ALT 46  --   --   ALKPHOS 57  --   --   BILITOT 0.7  --   --  GFRNONAA 35* 44* 45*  GFRAA 40* 51* 52*  ANIONGAP Hematology  Recent Labs Lab 06/12/17 0447 06/13/17 0531 06/14/17 0531  WBC 8.7 8.7 8.9  RBC 3.81 3.75* 3.85  HGB 11.9* 11.8* 12.1  HCT 35.5 34.5* 35.5  MCV 93.1 92.0 92.0  MCH 31.3 31.3 31.5  MCHC 33.6 34.1 34.2  RDW 12.9 12.9 13.1  PLT 193 225 248    Cardiac Enzymes  Recent Labs Lab 06/10/17 0845 06/10/17 1320 06/10/17 1826  TROPONINI 0.93* 2.22* 2.18*    BNP  Recent Labs Lab 06/10/17 0845  BNP 107.0*      Radiology    Ct Head Wo Contrast  Result Date: 06/12/2017 CLINICAL DATA:  Increased left-sided weakness. History of subdural hematoma and stroke EXAM: CT HEAD WITHOUT CONTRAST TECHNIQUE: Contiguous axial images were obtained from the base of the skull through the vertex without intravenous contrast. COMPARISON:  Two days ago FINDINGS: Brain: Moderate gliosis at the right frontal parietal junction. Prominent white matter low density with midly thin appearance of cortex deep to a right parietal bone flap, stable from priors and likely low-grade gliosis. There is dense gliosis in the superficial  right temporal lobe, likely posttraumatic. No acute infarct suspected. Remote lacunar infarct in the left caudate head. Ventriculomegaly that is stable. 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 Chronic microvascular ischemic type change in the cerebral white matter. Vascular: No hyperdense vessel or unexpected calcification. Skull: Previous craniectomy and cranioplasty on the right. No acute or aggressive finding. Sinuses/Orbits: No acute finding.  Bilateral cataract resection. IMPRESSION: 1. No acute finding or change from prior. 2. Right temporal and frontal parietal encephalomalacia. 3. Right parietal cranioplasty, reportedly related to remote subdural hematoma evacuation. 4. Atrophy and ventriculomegaly. Electronically Signed   By: Marnee Spring M.D.   On: 06/12/2017 12:15   Mr Brain Wo Contrast  Result Date: 06/13/2017 CLINICAL DATA:  New onset headache and fatigue. Presenting to the emergency department 3 days ago. Symptoms for 1 week. Acute change in mental status yesterday. EXAM: MRI HEAD WITHOUT CONTRAST TECHNIQUE: Multiplanar, multiecho pulse sequences of the brain and surrounding structures were obtained without intravenous contrast. COMPARISON:  CT head without contrast 06/12/2017. MRI brain 04/06/2010 FINDINGS: Brain: A punctate acute nonhemorrhagic infarct is noted within the posterior right external capsule. There is no associated hemorrhage. There may be an area of focal restricted diffusion along the left lateral aspect of the corpus callosum in the posterior left frontal lobe. No acute cortical infarct is present. There is no acute hemorrhage or mass lesion. Remote right MCA territory infarcts are present with extensive involvement of the right parietal and to lesser extent temporal lobes. There is ex vacuo dilation associated with these more remote infarcts. Remote lacunar infarcts are again noted in the basal ganglia  bilaterally, left greater than right. A remote lacunar infarct is present in the right thalamus, new since 2011. Remote lacunar infarcts of the brainstem and right cerebellum are stable. Vascular: Flow is present in the major intracranial arteries. Skull and upper cervical spine: The skullbase is within normal limits. The craniocervical junction is normal. Sinuses/Orbits: Mild mucosal thickening is present in the maxillary sinuses bilaterally, worse on the right. There is minimal mucosal thickening within the ethmoid air cells. The mastoid air cells are clear. The remaining paranasal sinuses are clear. IMPRESSION: 1. Two punctate foci of restricted diffusion suggesting acute nonhemorrhagic infarcts involving posterior right external capsule and  lateral posterior left corpus callosum. 2. Large remote cortical infarcts in the right MCA territory are stable. 3. Remote lacunar infarcts of the basal ganglia, brainstem, and cerebellum are similar the prior study. A least 1 new right thalamic lacunar infarct is present since 2011. This is not acute. Electronically Signed   By: Marin Roberts M.D.   On: 06/13/2017 11:31   US Carotid Bilateral  Result Date: 06/14/2017 CLINICAL DATA:  CVA. EXAM: BILATERAL CAROTID DUPLEX ULTRASOUND TECHNIQUE: Wallace Cullens scale imaging, color Doppler and duplex ultrasound were performed of bilateral carotid and vertebral arteries in the neck. COMPARISON:  MRI 06/13/2017.  CT 06/12/2017. FINDINGS: Criteria: Quantification of carotid stenosis is based on velocity parameters that correlate the residual internal carotid diameter with NASCET-based stenosis levels, using the diameter of the distal internal carotid lumen as the denominator for stenosis measurement. The following velocity measurements were obtained: RIGHT ICA:  93/33 cm/sec CCA:  119/28 cm/sec SYSTOLIC ICA/CCA RATIO:  0.8 DIASTOLIC ICA/CCA RATIO:  1.2 ECA:  88 cm/sec LEFT ICA:  94/23 cm/sec CCA:  121/18 cm/sec SYSTOLIC ICA/CCA  RATIO:  0.8 DIASTOLIC ICA/CCA RATIO:  1.3 ECA:  85 cm/sec RIGHT CAROTID ARTERY: Mild right carotid bifurcation proximal ICA atherosclerotic vascular disease. No flow limiting stenosis. RIGHT VERTEBRAL ARTERY:  Patent with antegrade flow. LEFT CAROTID ARTERY: Mild left common carotid, carotid bifurcation, proximal ICA atherosclerotic vascular disease. No flow limiting stenosis. LEFT VERTEBRAL ARTERY:  Patent with antegrade flow. IMPRESSION: 1. Mild bilateral carotid atherosclerotic vascular disease. No flow limiting stenosis. Degree of stenosis less than 50% bilaterally. 2. Vertebrals are patent antegrade flow. Electronically Signed   By: Maisie Fus  Register   On: 06/14/2017 06:01   Dg Chest Port 1 View  Result Date: 06/12/2017 CLINICAL DATA:  Hypoxia.  Mental status change EXAM: PORTABLE CHEST 1 VIEW COMPARISON:  Two days ago FINDINGS: Chronic borderline heart size. Mild aortic tortuosity. Chronic elevation the left diaphragm with mild scarring. There is no edema, consolidation, effusion, or pneumothorax. IMPRESSION: Stable from prior.  No evidence of active disease. Electronically Signed   By: Marnee Spring M.D.   On: 06/12/2017 12:01    Telemetry    RSR - Personally Reviewed  Cardiac Studies   2D Echocardiogram 10.13.2018  Study Conclusions  - Left ventricle: The cavity size was normal. Systolic function was   normal. The estimated ejection fraction was in the range of 55%   to 60%. Wall motion was normal; there were no regional wall   motion abnormalities. Doppler parameters are consistent with   abnormal left ventricular relaxation (grade 1 diastolic   dysfunction). - Left atrium: The atrium was normal in size. - Right ventricle: Systolic function was normal. - Pulmonary arteries: Systolic pressure was within the normal   range.  LHC 06/13/2017: Conclusion     Prox LAD lesion, 20 %stenosed.  Dist LAD lesion, 50 %stenosed.   1. Moderate nonobstructive one-vessel coronary  artery disease affecting the distal LAD in a tortuous segment. 2. Normal left ventricular end-diastolic pressure. Left ventricular angiography was not performed. EF was normal by echo.  Recommendations: Medical therapy. No indication for revascularization.     Patient Profile     81 y.o. female presented with headache, leg swelling, and fatigue. Cardiology consulted for elevated troponin.  Assessment & Plan    1.  Elevated troponin/NSTEMI: no c/p or dyspnea.  Peak trop of 2.22. Echo with normal EF and no RWMA. Underwent LHC on 10/15 as detailed above, showing no obstructive CAD with medical management  advised. Continue ASA, Lipitor, Coreg, Lasix, and lisinopril. Consider cardiac rehab as an outpatient.   2.  AMS, transient: stable this AM.  CT w/o acute findings.  MRI showing two areas of possible acute ischemic infarcts. Already on Aggrenox. Per IM.   3.  Essential HTN: Improved. Stopped HCTZ given patiently only on a low-dose of Lasix. If further diuresis is needed, would titrate Lasix initially. Continue remaining current medications.  4.  HL:  LDL 88 in 12/2016.  Cont high potency statin Rx.  5.  CKD III: stable.  Signed, Eula Listen, PA-C  06/14/2017, 9:12 AM    For questions or updates, please contact   Please consult [email or web address] for contact info under Cardiology/STEMI.

## 2017-06-14 NOTE — Progress Notes (Signed)
Patient has rested quietly today with family at bedside. Up to chair for the afternoon. Back pain improved with PRN tylenol. Likely discharging to [facility] tomorrow.

## 2017-06-14 NOTE — Clinical Social Work Note (Signed)
CSW presented bed offers to patient and her family they have chosen [facility]	[place].  CSW contacted [facility] and they can accept patient once she is medically ready for discharge and orders have been received.  Ervin Knack. Ammarie Matsuura, MSW, Theresia Majors [phone]  06/14/2017 3:58 PM

## 2017-06-14 NOTE — Progress Notes (Signed)
Chaplain received a RR page for pt. in [id number]. CH arrived when medical team was assessing pt. Pt's daughter was at bedside. Pt alert but slow to respond to commands. CH talked to daughter, provided silent prayer and pastoral presence.    06/14/17 2000  Clinical Encounter Type  Visited With Patient;Patient and family together;Health care provider  Visit Type Initial;Spiritual support;Code;Other (Comment)  Referral From Nurse  Consult/Referral To Chaplain  Spiritual Encounters  Spiritual Needs Prayer;Emotional;Other (Comment)

## 2017-06-14 NOTE — Care Management Important Message (Signed)
Important Message  Patient Details  Name: Shawna [name] MRN: [id number] Date of Birth: 01/26/1936   Medicare Important Message Given:  Yes    Chapman Fitch, RN 06/14/2017, 3:27 PM

## 2017-06-14 NOTE — Consult Note (Addendum)
Referring Physician: Sudini    Chief Complaint: AMS  HPI: Shawna [name] is an 81 y.o. female who presented with complaints of LE swelling, headache and fatigue.  Troponins were elevated and patient was admitted.  Patient with some altered mental status during hospitalization on 10/14 and MRI of the brain was performed that showed acute infarcts.  Patient now back to baseline.    Date last known well: Date: 06/12/2017 Time last known well: Unable to determine tPA Given: No: Return to baseline.    Past Medical History:  Diagnosis Date  . CKD (chronic kidney disease), stage III (HCC)   . Depression   . DJD (degenerative joint disease)   . High cholesterol   . Hypertension   . Hypothyroidism   . Morbid obesity (HCC)   . Seizures (HCC)    a. following remote stroke.  . Stroke [facility])    a. 1964-->residual right arm wkns.  Previously on coumadin - pt says for just a few yrs.  . Subdural hematoma (HCC)    a. 10/2001 [facility] req temporal frontoparietal craniotomy following fall. ? whether or not pt on coumadin @ time.  Notes indicate yes but pt denies.  . Tuberculosis    a. ~ 1950    Past Surgical History:  Procedure Laterality Date  . ABDOMINAL HYSTERECTOMY    . BRAIN SURGERY    . cataract surgery     2014  . LEFT HEART CATH AND CORONARY ANGIOGRAPHY N/A 06/13/2017   Procedure: LEFT HEART CATH AND CORONARY ANGIOGRAPHY;  Surgeon: Iran Ouch, MD;  Location: [facility] INVASIVE CV LAB;  Service: Cardiovascular;  Laterality: N/A;    Family History  Problem Relation Age of Onset  . Heart failure Mother        died @ 28  . Heart attack Father        died @ 63  . Breast cancer Neg Hx    Social History:  reports that she has never smoked. She has never used smokeless tobacco. She reports that she does not drink alcohol or use drugs.  Allergies: No Known Allergies  Medications:  I have reviewed the patient's current medications. Prior to Admission:  Prescriptions Prior to Admission   Medication Sig Dispense Refill Last Dose  . cloNIDine (CATAPRES - DOSED IN MG/24 HR) 0.2 mg/24hr patch Place 1 patch onto the skin every 7 (seven) days.  1 06/10/2017 at Unknown time  . dipyridamole-aspirin (AGGRENOX) 200-25 MG 12hr capsule Take 1 capsule by mouth 2 (two) times daily.   06/10/2017 at Unknown time  . latanoprost (XALATAN) 0.005 % ophthalmic solution Place 1 drop into both eyes every evening.  3 06/10/2017 at Unknown time  . levothyroxine (SYNTHROID, LEVOTHROID) 88 MCG tablet Take 1 tablet by mouth daily.  1 06/10/2017 at Unknown time  . lisinopril-hydrochlorothiazide (PRINZIDE,ZESTORETIC) 20-25 MG tablet Take 1 tablet by mouth daily.  1 06/10/2017 at Unknown time  . mirtazapine (REMERON) 45 MG tablet Take 45 mg by mouth at bedtime.  4 06/10/2017 at Unknown time  . oxybutynin (DITROPAN-XL) 10 MG 24 hr tablet Take 10 mg by mouth daily.  1 06/10/2017 at Unknown time  . simvastatin (ZOCOR) 80 MG tablet Take 80 mg by mouth daily.  1 06/10/2017 at Unknown time  . VIIBRYD 40 MG TABS Take 1 tablet by mouth daily.  4 06/10/2017 at Unknown time   Scheduled: . aspirin EC  81 mg Oral Daily  . atorvastatin  80 mg Oral q1800  . carvedilol  3.125 mg Oral BID WC  . cloNIDine  0.2 mg Transdermal Q7 days  . dipyridamole-aspirin  1 capsule Oral BID  . furosemide  20 mg Oral Daily  . latanoprost  1 drop Both Eyes QPM  . levothyroxine  88 mcg Oral Daily  . lisinopril  20 mg Oral Daily  . mirtazapine  45 mg Oral QHS  . oxybutynin  10 mg Oral Daily  . sodium chloride flush  3 mL Intravenous Q12H  . sodium chloride flush  3 mL Intravenous Q12H    ROS: History obtained from the patient  General ROS: negative for - chills, fatigue, fever, night sweats, weight gain or weight loss Psychological ROS: negative for - behavioral disorder, hallucinations, memory difficulties, mood swings or suicidal ideation Ophthalmic ROS: negative for - blurry vision, double vision, eye pain or loss of vision ENT  ROS: negative for - epistaxis, nasal discharge, oral lesions, sore throat, tinnitus or vertigo Allergy and Immunology ROS: negative for - hives or itchy/watery eyes Hematological and Lymphatic ROS: negative for - bleeding problems, bruising or swollen lymph nodes Endocrine ROS: negative for - galactorrhea, hair pattern changes, polydipsia/polyuria or temperature intolerance Respiratory ROS: negative for - cough, hemoptysis, shortness of breath or wheezing Cardiovascular ROS: negative for - chest pain, dyspnea on exertion, edema or irregular heartbeat Gastrointestinal ROS: negative for - abdominal pain, diarrhea, hematemesis, nausea/vomiting or stool incontinence Genito-Urinary ROS: negative for - dysuria, hematuria, incontinence or urinary frequency/urgency Musculoskeletal ROS: facial injury Neurological ROS: as noted in HPI Dermatological ROS: negative for rash and skin lesion changes  Physical Examination: Blood pressure 125/70, pulse 86, temperature 98.7 F (37.1 C), temperature source Oral, resp. rate 16, height  (1.676 m), weight 103 kg (227 lb), SpO2 91 %.  HEENT-  Normocephalic, scar on right cheek, without obvious abnormality.  Normal external eye and conjunctiva.  Normal TM's bilaterally.  Normal auditory canals and external ears. Normal external nose, mucus membranes and septum.  Normal pharynx. Cardiovascular- S1, S2 normal, pulses palpable throughout   Lungs- chest clear, no wheezing, rales, normal symmetric air entry Abdomen- soft, non-tender; bowel sounds normal; no masses,  no organomegaly Extremities- LE edema Lymph-no adenopathy palpable Musculoskeletal-no joint tenderness, deformity or swelling Skin-bruising right wrist  Neurological Examination   Mental Status: Alert, oriented, thought content appropriate.  Speech fluent without evidence of aphasia.  Able to follow 3 step commands without difficulty. Cranial Nerves: II: Discs flat bilaterally; Visual fields  grossly normal, left pupil 4mm, right pupil 3mm, both reactive III,IV, VI: left ptosis, extra-ocular motions intact bilaterally V,VII: left facial droop, facial light touch sensation normal bilaterally VIII: hearing normal bilaterally IX,X: gag reflex present XI: bilateral shoulder shrug XII: midline tongue extension Motor: Right : Upper extremity   5/5    Left:     Upper extremity   5/5  Lower extremity   3+/5    Lower extremity   3+/5 with further weakness distally Twitching noted of the right face Sensory: Pinprick and light touch intact throughout, bilaterally Deep Tendon Reflexes: 2+ and symmetric with absent AJ's bilaterally Plantars: Right: mute   Left: mute Cerebellar: Normal finger-to-nose testing bilaterally Gait: not tested due to safety concerns  Laboratory Studies:  Basic Metabolic Panel:  Recent Labs Lab 06/10/17 0845 06/11/17 0233 06/13/17 0531  NA 141 142 137  K 3.4* 3.3* 3.7  CL 100* 101 98*  CO2 GLUCOSE 138* 137* 121*  BUN 27* 26* 26*  CREATININE 1.41* 1.16* 1.14*  CALCIUM 9.0 8.9 8.9    Liver Function Tests:  Recent Labs Lab 06/10/17 0845  AST 65*  ALT 46  ALKPHOS 57  BILITOT 0.7  PROT 7.6  ALBUMIN 3.5   No results for input(s): LIPASE, AMYLASE in the last 168 hours. No results for input(s): AMMONIA in the last 168 hours.  CBC:  Recent Labs Lab 06/10/17 0845 06/11/17 0233 06/12/17 0447 06/13/17 0531 06/14/17 0531  WBC 7.8 7.9 8.7 8.7 8.9  HGB 12.3 11.7* 11.9* 11.8* 12.1  HCT 36.0 34.7* 35.5 34.5* 35.5  MCV 91.4 92.0 93.1 92.0 92.0  PLT 172 184 193 225 248    Cardiac Enzymes:  Recent Labs Lab 06/10/17 0845 06/10/17 1320 06/10/17 1826  CKTOTAL  --  183  --   TROPONINI 0.93* 2.22* 2.18*    BNP: Invalid input(s): POCBNP  CBG:  Recent Labs Lab 06/12/17 1113 06/12/17 1130 06/13/17 0801 06/13/17 2122  GLUCAP 146* 120* 126* 173*    Microbiology: No results found for this or any previous  visit.  Coagulation Studies: No results for input(s): LABPROT, INR in the last 72 hours.  Urinalysis:  Recent Labs Lab 06/10/17 0845  COLORURINE YELLOW*  LABSPEC 1.015  PHURINE 5.0  GLUCOSEU NEGATIVE  HGBUR SMALL*  BILIRUBINUR NEGATIVE  KETONESUR NEGATIVE  PROTEINUR NEGATIVE  NITRITE NEGATIVE  LEUKOCYTESUR NEGATIVE    Lipid Panel: No results found for: CHOL, TRIG, HDL, CHOLHDL, VLDL, LDLCALC  HgbA1C: No results found for: HGBA1C  Urine Drug Screen:  No results found for: LABOPIA, COCAINSCRNUR, LABBENZ, AMPHETMU, THCU, LABBARB  Alcohol Level: No results for input(s): ETH in the last 168 hours.  Other results: EKG: sinus rhythm at 87 bpm.  Imaging: Mr Brain Wo Contrast  Result Date: 06/13/2017 CLINICAL DATA:  New onset headache and fatigue. Presenting to the emergency department 3 days ago. Symptoms for 1 week. Acute change in mental status yesterday. EXAM: MRI HEAD WITHOUT CONTRAST TECHNIQUE: Multiplanar, multiecho pulse sequences of the brain and surrounding structures were obtained without intravenous contrast. COMPARISON:  CT head without contrast 06/12/2017. MRI brain 04/06/2010 FINDINGS: Brain: A punctate acute nonhemorrhagic infarct is noted within the posterior right external capsule. There is no associated hemorrhage. There may be an area of focal restricted diffusion along the left lateral aspect of the corpus callosum in the posterior left frontal lobe. No acute cortical infarct is present. There is no acute hemorrhage or mass lesion. Remote right MCA territory infarcts are present with extensive involvement of the right parietal and to lesser extent temporal lobes. There is ex vacuo dilation associated with these more remote infarcts. Remote lacunar infarcts are again noted in the basal ganglia bilaterally, left greater than right. A remote lacunar infarct is present in the right thalamus, new since 2011. Remote lacunar infarcts of the brainstem and right cerebellum are  stable. Vascular: Flow is present in the major intracranial arteries. Skull and upper cervical spine: The skullbase is within normal limits. The craniocervical junction is normal. Sinuses/Orbits: Mild mucosal thickening is present in the maxillary sinuses bilaterally, worse on the right. There is minimal mucosal thickening within the ethmoid air cells. The mastoid air cells are clear. The remaining paranasal sinuses are clear. IMPRESSION: 1. Two punctate foci of restricted diffusion suggesting acute nonhemorrhagic infarcts involving posterior right external capsule and lateral posterior left corpus callosum. 2. Large remote cortical infarcts in the right MCA territory are stable. 3. Remote lacunar infarcts of the basal ganglia, brainstem, and cerebellum are similar the prior study. A least  1 new right thalamic lacunar infarct is present since 2011. This is not acute. Electronically Signed   By: Marin Roberts M.D.   On: 06/13/2017 11:31   US Carotid Bilateral  Result Date: 06/14/2017 CLINICAL DATA:  CVA. EXAM: BILATERAL CAROTID DUPLEX ULTRASOUND TECHNIQUE: Wallace Cullens scale imaging, color Doppler and duplex ultrasound were performed of bilateral carotid and vertebral arteries in the neck. COMPARISON:  MRI 06/13/2017.  CT 06/12/2017. FINDINGS: Criteria: Quantification of carotid stenosis is based on velocity parameters that correlate the residual internal carotid diameter with NASCET-based stenosis levels, using the diameter of the distal internal carotid lumen as the denominator for stenosis measurement. The following velocity measurements were obtained: RIGHT ICA:  93/33 cm/sec CCA:  119/28 cm/sec SYSTOLIC ICA/CCA RATIO:  0.8 DIASTOLIC ICA/CCA RATIO:  1.2 ECA:  88 cm/sec LEFT ICA:  94/23 cm/sec CCA:  121/18 cm/sec SYSTOLIC ICA/CCA RATIO:  0.8 DIASTOLIC ICA/CCA RATIO:  1.3 ECA:  85 cm/sec RIGHT CAROTID ARTERY: Mild right carotid bifurcation proximal ICA atherosclerotic vascular disease. No flow limiting  stenosis. RIGHT VERTEBRAL ARTERY:  Patent with antegrade flow. LEFT CAROTID ARTERY: Mild left common carotid, carotid bifurcation, proximal ICA atherosclerotic vascular disease. No flow limiting stenosis. LEFT VERTEBRAL ARTERY:  Patent with antegrade flow. IMPRESSION: 1. Mild bilateral carotid atherosclerotic vascular disease. No flow limiting stenosis. Degree of stenosis less than 50% bilaterally. 2. Vertebrals are patent antegrade flow. Electronically Signed   By: Maisie Fus  Register   On: 06/14/2017 06:01    Assessment: 81 y.o. female presenting with headache and elevated troponin.  With altered mental status while hospitalized.  MRI of the brain performed and reviewed.  MRI shows two punctate acute infarcts in the right external capsule and the left posterior corpus callosum.  Suspect embolic etiology.  Patient on Aggrenox at home.  Cardiac cath unremarkable.  EF of 55-60% on echo.  Carotid dopplers show no evidence of hemodynamically significant stenosis.    Stroke Risk Factors - hyperlipidemia and hypertension  Plan: 1. HgbA1c, fasting lipid panel 2. PT consult, OT consult, Speech consult 3. Prophylactic therapy-Agree with cardiology's recommendation of ASA.   4. NPO until RN stroke swallow screen 5. Telemetry monitoring 6. Frequent neuro checks 7. Patient to follow up with neurology on an outpatient basis.   8. May consider long term cardiac monitoring on an outpatient basis.      Thana Farr, MD Neurology [phone] 06/14/2017, 12:48 PM

## 2017-06-14 NOTE — Care Management (Signed)
Patient admitted from home with Acute right external capsule and left corpus callosum CVA.  Patient lives at home with husband.  Daughter at bedside for support.  PCP Dareen Piano. PT has assessed patient and recommends SNF  CSW to assesses patient. Patient has scotter, walker, and cane in the home.  Patient has had home health services in the past, but states "its been along time ago and can't remember who it is".  Daughter request PCS services list as a resource should they need it if they go to SNF, then return home.  RNCM following for discharge planning.

## 2017-06-14 NOTE — NC FL2 (Signed)
[place] MEDICAID FL2 LEVEL OF CARE SCREENING TOOL     IDENTIFICATION  Patient Name: Shawna [name] Birthdate: 12/29/1935 Sex: female Admission Date (Current Location): 06/10/2017  [place] and [place] Number:  Chiropodist and Address:  [facility], [address] [place], [place] [address]      Provider Number: [id number]  Attending Physician Name and Address:  Milagros Loll, MD  Relative Name and Phone Number:  Canna, Nickelson [phone]  [phone] and Jeslynn, Hollander Daughter [phone]     Current Level of Care: Hospital Recommended Level of Care: Skilled Nursing Facility Prior Approval Number:    Date Approved/Denied:   PASRR Number: [id number]  Discharge Plan: SNF    Current Diagnoses: Patient Active Problem List   Diagnosis Date Noted  . NSTEMI (non-ST elevated myocardial infarction) (HCC) 06/10/2017  . Otalgia of both ears 06/08/2017  . Right leg pain 06/08/2017  . Dizzy 06/08/2017    Orientation RESPIRATION BLADDER Height & Weight     Self, Time, Place, Situation  Normal Incontinent Weight: 227 lb (103 kg) Height:   (167.6 cm)  BEHAVIORAL SYMPTOMS/MOOD NEUROLOGICAL BOWEL NUTRITION STATUS      Continent Diet (Cardiac)  AMBULATORY STATUS COMMUNICATION OF NEEDS Skin   Limited Assist Verbally Normal                       Personal Care Assistance Level of Assistance  Bathing, Feeding, Dressing Bathing Assistance: Limited assistance Feeding assistance: Independent Dressing Assistance: Limited assistance     Functional Limitations Info  Sight, Hearing, Speech Sight Info: Adequate Hearing Info: Adequate Speech Info: Adequate    SPECIAL CARE FACTORS FREQUENCY  PT (By licensed PT)     PT Frequency: 5x a week              Contractures Contractures Info: Not present    Additional Factors Info  Code Status, Allergies, Psychotropic Code Status Info: Full Code Allergies Info:  NKA Psychotropic Info: mirtazapine (REMERON) tablet 45 mg         Current Medications (06/14/2017):  This is the current hospital active medication list Current Facility-Administered Medications  Medication Dose Route Frequency Provider Last Rate Last Dose  . 0.9 %  sodium chloride infusion  250 mL Intravenous PRN Iran Ouch, MD      . acetaminophen (TYLENOL) tablet 650 mg  650 mg Oral Q6H PRN Milagros Loll, MD   650 mg at 06/14/17 1002   Or  . acetaminophen (TYLENOL) suppository 650 mg  650 mg Rectal Q6H PRN Sudini, Srikar, MD      . albuterol (PROVENTIL) (2.5 MG/3ML) 0.083% nebulizer solution 2.5 mg  2.5 mg Nebulization Q2H PRN Sudini, Srikar, MD      . aspirin EC tablet 81 mg  81 mg Oral Daily Milagros Loll, MD   81 mg at 06/14/17 1002  . atorvastatin (LIPITOR) tablet 80 mg  80 mg Oral q1800 Ok Anis, NP   80 mg at 06/13/17 1905  . carvedilol (COREG) tablet 3.125 mg  3.125 mg Oral BID WC Tonny Bollman, MD   3.125 mg at 06/14/17 0807  . cloNIDine (CATAPRES - Dosed in mg/24 hr) patch 0.2 mg  0.2 mg Transdermal Q7 days Milagros Loll, MD   0.2 mg at 06/10/17 1358  . dipyridamole-aspirin (AGGRENOX) 200-25 MG per 12 hr capsule 1 capsule  1 capsule Oral BID Milagros Loll, MD   1 capsule at 06/14/17 1002  . furosemide (LASIX) tablet  20 mg  20 mg Oral Daily Milagros Loll, MD   20 mg at 06/14/17 1002  . latanoprost (XALATAN) 0.005 % ophthalmic solution 1 drop  1 drop Both Eyes QPM Milagros Loll, MD   1 drop at 06/13/17 1810  . levothyroxine (SYNTHROID, LEVOTHROID) tablet 88 mcg  88 mcg Oral Daily Milagros Loll, MD   88 mcg at 06/14/17 0541  . lisinopril (PRINIVIL,ZESTRIL) tablet 20 mg  20 mg Oral Daily Milagros Loll, MD   20 mg at 06/14/17 1002  . mirtazapine (REMERON) tablet 45 mg  45 mg Oral QHS Arnaldo Natal, MD   45 mg at 06/13/17 2237  . ondansetron (ZOFRAN) tablet 4 mg  4 mg Oral Q6H PRN Milagros Loll, MD       Or  . ondansetron (ZOFRAN) injection 4 mg  4  mg Intravenous Q6H PRN Sudini, Wardell Heath, MD      . oxybutynin (DITROPAN-XL) 24 hr tablet 10 mg  10 mg Oral Daily Milagros Loll, MD   10 mg at 06/14/17 1002  . polyethylene glycol (MIRALAX / GLYCOLAX) packet 17 g  17 g Oral Daily PRN Sudini, Wardell Heath, MD      . sodium chloride flush (NS) 0.9 % injection 3 mL  3 mL Intravenous Q12H Milagros Loll, MD   3 mL at 06/13/17 0908  . sodium chloride flush (NS) 0.9 % injection 3 mL  3 mL Intravenous Q12H Lorine Bears A, MD   3 mL at 06/14/17 1007  . sodium chloride flush (NS) 0.9 % injection 3 mL  3 mL Intravenous PRN Lorine Bears A, MD      . traMADol (ULTRAM) tablet 50 mg  50 mg Oral Q6H PRN Milagros Loll, MD         Discharge Medications: Please see discharge summary for a list of discharge medications.  Relevant Imaging Results:  Relevant Lab Results:   Additional Information SSN [id number]  Darleene Cleaver, [place]

## 2017-06-14 NOTE — Significant Event (Addendum)
Rapid Response Event Note  Overview: Time Called: 1945 Arrival Time: 1948 Event Type: Other (Comment)  Initial Focused Assessment: Pt on commode, pale and diaphoretic after straining to have a BM. Vital signs as documented on flowsheet.    Interventions: Pt allowed to sit for some time, then recovered to baseline  Plan of Care (if not transferred): Monitor and obtain orders for stool softeners  Event Summary: Pt at baseline.   at      at    Outcome: Stayed in room and stabalized     Shawna [name] A

## 2017-06-14 NOTE — Clinical Social Work Note (Signed)
Clinical Social Work Assessment  Patient Details  Name: Shawna [name] MRN: [id number] Date of Birth: 06-20-1936  Date of referral:  06/14/17               Reason for consult:  Facility Placement                Permission sought to share information with:  Facility Medical sales representative, Family Supports Permission granted to share information::  Yes, Verbal Permission Granted  Name::     Shawna, [name] [phone] or Shawna,[name] Daughter [phone]   Agency::  SNF admissions  Relationship::     Contact Information:     Housing/Transportation Living arrangements for the past 2 months:  Single Family Home Source of Information:  Patient, Adult Children, Spouse Patient Interpreter Needed:  None Criminal Activity/Legal Involvement Pertinent to Current Situation/Hospitalization:  No - Comment as needed Significant Relationships:  Adult Children, Spouse Lives with:  Spouse Do you feel safe going back to the place where you live?  No Need for family participation in patient care:  No (Coment)  Care giving concerns:  Patient and family feels she needs short term rehab before she is able to return back home.   Social Worker assessment / plan:  Patient is a 81 year old female who is alert and oriented x4, patient expresses that she has not been to rehab before.  CSW explained process for finding placement, and role of Child psychotherapist.  CSW explained how insurance will pay for the stay at SNF and what to expect.  Patient and family are requesting [facility]	[place].  CSW was given permission to begin bed search in [place].  Employment status:  Retired Database administrator PT Recommendations:  Skilled Nursing Facility Information / Referral to community resources:  Skilled Nursing Facility  Patient/Family's Response to care:  Patient and family are in agreement to going to SNF for short term rehab.  Patient/Family's Understanding of and  Emotional Response to Diagnosis, Current Treatment, and Prognosis:  Patient and family are hopeful they will not need to be in SNF for very long.  Emotional Assessment Appearance:  Appears stated age Attitude/Demeanor/Rapport:    Affect (typically observed):  Appropriate, Calm Orientation:  Oriented to Self, Oriented to Place, Oriented to  Time, Oriented to Situation Alcohol / Substance use:  Not Applicable Psych involvement (Current and /or in the community):  No (Comment)  Discharge Needs  Concerns to be addressed:  Lack of Support, Care Coordination Readmission within the last 30 days:  No Current discharge risk:  Lack of support system Barriers to Discharge:  Continued Medical Work up   Darleene Cleaver, LCSWA 06/14/2017, 4:01 PM

## 2017-06-14 NOTE — Progress Notes (Signed)
[employer] [facility] - [place] at [facility]   PATIENT NAME: Shawna [name]    MR#:  [id number]  DATE OF BIRTH:  08-May-1936  SUBJECTIVE:  CHIEF COMPLAINT:   Chief Complaint  Patient presents with  . Leg Swelling  . Headache   UP in a chair and eating. Feels weak. Chronic left weakness  REVIEW OF SYSTEMS:    Review of Systems  Constitutional: Positive for malaise/fatigue. Negative for chills and fever.  HENT: Negative for sore throat.   Eyes: Negative for blurred vision, double vision and pain.  Respiratory: Negative for cough, hemoptysis, shortness of breath and wheezing.   Cardiovascular: Positive for leg swelling. Negative for chest pain, palpitations and orthopnea.  Gastrointestinal: Negative for abdominal pain, constipation, diarrhea, heartburn, nausea and vomiting.  Genitourinary: Negative for dysuria and hematuria.  Musculoskeletal: Negative for back pain and joint pain.  Skin: Negative for rash.  Neurological: Positive for weakness. Negative for sensory change, speech change, focal weakness and headaches.  Endo/Heme/Allergies: Does not bruise/bleed easily.  Psychiatric/Behavioral: Negative for depression. The patient is not nervous/anxious.     DRUG ALLERGIES:  No Known Allergies  VITALS:  Blood pressure 125/70, pulse 86, temperature 98.7 F (37.1 C), temperature source Oral, resp. rate 16, height  (1.676 m), weight 103 kg (227 lb), SpO2 91 %.  PHYSICAL EXAMINATION:   Physical Exam  GENERAL:  81 y.o.-year-old patient lying in the bed with no acute distress.  EYES: Pupils equal, round, reactive to light and accommodation. No scleral icterus. Extraocular muscles intact.  HEENT: Head atraumatic, normocephalic. Oropharynx and nasopharynx clear.  NECK:  Supple, no jugular venous distention. No thyroid enlargement, no tenderness.  LUNGS: Normal breath sounds bilaterally, no wheezing, rales, rhonchi. No use of accessory muscles of respiration.   CARDIOVASCULAR: S1, S2 normal. No murmurs, rubs, or gallops.  ABDOMEN: Soft, nontender, nondistended. Bowel sounds present. No organomegaly or mass.  EXTREMITIES: No cyanosis, clubbing. Bilateral lower extremity edema NEUROLOGIC: Cranial nerves II through XII are intact. Left weakness PSYCHIATRIC: The patient is alert and oriented x 3.  SKIN: No obvious rash, lesion, or ulcer.   LABORATORY PANEL:   CBC  Recent Labs Lab 06/14/17 0531  WBC 8.9  HGB 12.1  HCT 35.5  PLT 248   ------------------------------------------------------------------------------------------------------------------ Chemistries   Recent Labs Lab 06/10/17 0845  06/13/17 0531  NA 141  < > 137  K 3.4*  < > 3.7  CL 100*  < > 98*  CO2 30  < > 28  GLUCOSE 138*  < > 121*  BUN 27*  < > 26*  CREATININE 1.41*  < > 1.14*  CALCIUM 9.0  < > 8.9  AST 65*  --   --   ALT 46  --   --   ALKPHOS 57  --   --   BILITOT 0.7  --   --   < > = values in this interval not displayed. ------------------------------------------------------------------------------------------------------------------  Cardiac Enzymes  Recent Labs Lab 06/10/17 1826  TROPONINI 2.18*   ------------------------------------------------------------------------------------------------------------------  RADIOLOGY:  Mr Brain Wo Contrast  Result Date: 06/13/2017 CLINICAL DATA:  New onset headache and fatigue. Presenting to the emergency department 3 days ago. Symptoms for 1 week. Acute change in mental status yesterday. EXAM: MRI HEAD WITHOUT CONTRAST TECHNIQUE: Multiplanar, multiecho pulse sequences of the brain and surrounding structures were obtained without intravenous contrast. COMPARISON:  CT head without contrast 06/12/2017. MRI brain 04/06/2010 FINDINGS: Brain: A punctate acute nonhemorrhagic infarct is noted within the posterior right  external capsule. There is no associated hemorrhage. There may be an area of focal restricted diffusion  along the left lateral aspect of the corpus callosum in the posterior left frontal lobe. No acute cortical infarct is present. There is no acute hemorrhage or mass lesion. Remote right MCA territory infarcts are present with extensive involvement of the right parietal and to lesser extent temporal lobes. There is ex vacuo dilation associated with these more remote infarcts. Remote lacunar infarcts are again noted in the basal ganglia bilaterally, left greater than right. A remote lacunar infarct is present in the right thalamus, new since 2011. Remote lacunar infarcts of the brainstem and right cerebellum are stable. Vascular: Flow is present in the major intracranial arteries. Skull and upper cervical spine: The skullbase is within normal limits. The craniocervical junction is normal. Sinuses/Orbits: Mild mucosal thickening is present in the maxillary sinuses bilaterally, worse on the right. There is minimal mucosal thickening within the ethmoid air cells. The mastoid air cells are clear. The remaining paranasal sinuses are clear. IMPRESSION: 1. Two punctate foci of restricted diffusion suggesting acute nonhemorrhagic infarcts involving posterior right external capsule and lateral posterior left corpus callosum. 2. Large remote cortical infarcts in the right MCA territory are stable. 3. Remote lacunar infarcts of the basal ganglia, brainstem, and cerebellum are similar the prior study. A least 1 new right thalamic lacunar infarct is present since 2011. This is not acute. Electronically Signed   By: Marin Roberts M.D.   On: 06/13/2017 11:31   US Carotid Bilateral  Result Date: 06/14/2017 CLINICAL DATA:  CVA. EXAM: BILATERAL CAROTID DUPLEX ULTRASOUND TECHNIQUE: Wallace Cullens scale imaging, color Doppler and duplex ultrasound were performed of bilateral carotid and vertebral arteries in the neck. COMPARISON:  MRI 06/13/2017.  CT 06/12/2017. FINDINGS: Criteria: Quantification of carotid stenosis is based on velocity  parameters that correlate the residual internal carotid diameter with NASCET-based stenosis levels, using the diameter of the distal internal carotid lumen as the denominator for stenosis measurement. The following velocity measurements were obtained: RIGHT ICA:  93/33 cm/sec CCA:  119/28 cm/sec SYSTOLIC ICA/CCA RATIO:  0.8 DIASTOLIC ICA/CCA RATIO:  1.2 ECA:  88 cm/sec LEFT ICA:  94/23 cm/sec CCA:  121/18 cm/sec SYSTOLIC ICA/CCA RATIO:  0.8 DIASTOLIC ICA/CCA RATIO:  1.3 ECA:  85 cm/sec RIGHT CAROTID ARTERY: Mild right carotid bifurcation proximal ICA atherosclerotic vascular disease. No flow limiting stenosis. RIGHT VERTEBRAL ARTERY:  Patent with antegrade flow. LEFT CAROTID ARTERY: Mild left common carotid, carotid bifurcation, proximal ICA atherosclerotic vascular disease. No flow limiting stenosis. LEFT VERTEBRAL ARTERY:  Patent with antegrade flow. IMPRESSION: 1. Mild bilateral carotid atherosclerotic vascular disease. No flow limiting stenosis. Degree of stenosis less than 50% bilaterally. 2. Vertebrals are patent antegrade flow. Electronically Signed   By: Maisie Fus  Register   On: 06/14/2017 06:01     ASSESSMENT AND PLAN:  * Acute right external capsule and left corpus callosum CVA Symptoms have resolved. Patient did not receive TPA. Echocardiogram showed no thrombus. Carotids, normal. No dysphagia. Will consult PT. Discussed with Dr. Thad Ranger Aggrenox and statin. Change to plavix and ASA?  * Non-ST elevation MI On aspirin, heparin drip, beta blocker, statin. Echocardiogram done and no wall motion abnormalities. Cath - Mild CAD No chest pain.  * Acute congestive heart failure. Patient's lower extremity swelling is improving. No pulmonary edema on chest x-ray.  Echo - Normal Continue oral Lasix.   * Elevated d-dimer. No chest pain or shortness of breath or tachycardia or hypoxia. Unlikely PE.  lower extremity Dopplers negative for PE  * Hypertension, Uncontrolled. Patient is on clonidine  patch, lisinopril and hydrochlorothiazide, coreg  * CKD stage III is stable.  * DVT prophylaxis. On heparin.  All the records are reviewed and case discussed with Care Management/Social Workerr. Management plans discussed with the patient, family and they are in agreement.  CODE STATUS: FULL CODE  DVT Prophylaxis: SCDs  TOTAL TIME TAKING CARE OF THIS PATIENT: 40 minutes.   POSSIBLE D/C IN 1-2 DAYS, DEPENDING ON CLINICAL CONDITION.  Milagros Loll R M.D on 06/14/2017 at 12:17 PM  Between 7am to 6pm - Pager - [phone]  After 6pm go to [email or web address] - password [email or web address]  [facility] [place]  Office  [phone]  CC: Primary care physician; Lauro Regulus, MD  Note: This dictation was prepared with Dragon dictation along with smaller phrase technology. Any transcriptional errors that result from this process are unintentional.

## 2017-06-15 DIAGNOSIS — I639 Cerebral infarction, unspecified: Secondary | ICD-10-CM | POA: Diagnosis present

## 2017-06-15 DIAGNOSIS — R7989 Other specified abnormal findings of blood chemistry: Secondary | ICD-10-CM

## 2017-06-15 DIAGNOSIS — R778 Other specified abnormalities of plasma proteins: Secondary | ICD-10-CM | POA: Diagnosis present

## 2017-06-15 MED ORDER — PHENOL 1.4 % MT LIQD
1.0000 | OROMUCOSAL | Status: DC | PRN
  Administered 2017-06-15: 1 via OROMUCOSAL
  Filled 2017-06-15: qty 177

## 2017-06-15 MED ORDER — POLYETHYLENE GLYCOL 3350 17 G PO PACK: 17.0000 g | PACK | Freq: Every day | ORAL | 0 refills | Status: DC | PRN

## 2017-06-15 MED ORDER — POLYETHYLENE GLYCOL 3350 17 G PO PACK: 17.0000 g | PACK | Freq: Two times a day (BID) | ORAL | Status: DC

## 2017-06-15 MED ORDER — DOCUSATE SODIUM 100 MG PO CAPS: 100.0000 mg | ORAL_CAPSULE | Freq: Two times a day (BID) | ORAL | 0 refills | Status: DC

## 2017-06-15 MED ORDER — DOCUSATE SODIUM 100 MG PO CAPS
100.0000 mg | ORAL_CAPSULE | Freq: Two times a day (BID) | ORAL | Status: DC
  Administered 2017-06-15: 100 mg via ORAL
  Filled 2017-06-15: qty 1

## 2017-06-15 MED ORDER — LISINOPRIL 20 MG PO TABS: 20.0000 mg | ORAL_TABLET | Freq: Every day | ORAL | Status: DC

## 2017-06-15 MED ORDER — FUROSEMIDE 20 MG PO TABS: 20.0000 mg | ORAL_TABLET | Freq: Every day | ORAL | Status: DC

## 2017-06-15 NOTE — Clinical Social Work Note (Signed)
Patient to be d/c'[facility] today [facility] room [facility].  Patient and family agreeable to plans will transport via ems RN to call report room 209B [phone].  Windell MouldingEric Jerome Otter, MSW, Theresia MajorsLCSWA [phone]

## 2017-06-15 NOTE — Discharge Summary (Signed)
[employer] [facility] - [place] at [facility]   PATIENT NAME: Shawna [name]    MR#:  [id number]  DATE OF BIRTH:  Oct 18, 1935  DATE OF ADMISSION:  06/10/2017 ADMITTING PHYSICIAN: Milagros Loll, MD  DATE OF DISCHARGE: 06/15/2017  PRIMARY CARE PHYSICIAN: Lauro Regulus, MD   ADMISSION DIAGNOSIS:  headacahe NSTEMI  DISCHARGE DIAGNOSIS:  Active Problems:   Troponin I above reference range   Acute CVA (cerebrovascular accident) (HCC)   SECONDARY DIAGNOSIS:   Past Medical History:  Diagnosis Date  . CKD (chronic kidney disease), stage III (HCC)   . Depression   . DJD (degenerative joint disease)   . High cholesterol   . Hypertension   . Hypothyroidism   . Morbid obesity (HCC)   . Seizures (HCC)    a. following remote stroke.  . Stroke [facility])    a. 1964-->residual right arm wkns.  Previously on coumadin - pt says for just a few yrs.  . Subdural hematoma (HCC)    a. 10/2001 [facility] req temporal frontoparietal craniotomy following fall. ? whether or not pt on coumadin @ time.  Notes indicate yes but pt denies.  . Tuberculosis    a. ~ 1950     ADMITTING HISTORY   HISTORY OF PRESENT ILLNESS:  Shawna [name]  is a 81 y.o. female with a known history of Hypertension, CKD stage III, hyperlipidemia, CVA presents to the emergency room due to lower extremity swelling, headache and fatigue. Patient's symptoms have been going on for 1 week. Started on Lasix by her primary care physician with improved leg swelling. No orthopnea at this time. No chest pain. Here patient has been found to have BNP of 107 with troponin of 0.93. EKG shows no acute ST changes. No history of CHF, CAD.   HOSPITAL COURSE:   * Acute right external capsule and left corpus callosum CVA Symptoms have resolved. Patient did not receive TPA. Echocardiogram showed no thrombus. Carotids, normal. No dysphagia.  Discussed with Dr. Thad Ranger Aggrenox and statin.  PT recommended SNF  * Non-ST elevation MI  considered initially but with normal cath this is NOT MI On aspirin, heparin drip, beta blocker, statin. Echocardiogram done and no wall motion abnormalities. Cath - Mild CAD No chest pain.  * Acute diastolic congestive heart failure. Patient's lower extremity swelling is improving. No pulmonary edema on chest x-ray.  Echo - Normal Continue oral Lasix.   * Elevated d-dimer. No chest pain or shortness of breath or tachycardia or hypoxia. Unlikely PE.  lower extremity Dopplers negative for PE  * Hypertension, Uncontrolled. Patient is on clonidine patch, lisinopril and hydrochlorothiazide. HCTZ stopped. Added coreg  * CKD stage III is stable.  * DVT prophylaxis. On heparin.  * Vasovagal pre-syncope on 2 occasions while straining for bowel movement. Added colace. Miralax PRN   Stable for discharge to SNF CONSULTS OBTAINED:  Treatment Team:  Antonieta Iba, MD Kym Groom, MD  DRUG ALLERGIES:  No Known Allergies  DISCHARGE MEDICATIONS:   Current Discharge Medication List    START taking these medications   Details  docusate sodium (COLACE) 100 MG capsule Take 1 capsule (100 mg total) by mouth 2 (two) times daily. Qty: 10 capsule, Refills: 0    furosemide (LASIX) 20 MG tablet Take 1 tablet (20 mg total) by mouth daily. Qty: 30 tablet    lisinopril (PRINIVIL,ZESTRIL) 20 MG tablet Take 1 tablet (20 mg total) by mouth daily.    polyethylene glycol (MIRALAX / GLYCOLAX) packet Take 17  g by mouth daily as needed for mild constipation. Qty: 14 each, Refills: 0      CONTINUE these medications which have NOT CHANGED   Details  cloNIDine (CATAPRES - DOSED IN MG/24 HR) 0.2 mg/24hr patch Place 1 patch onto the skin every 7 (seven) days. Refills: 1    dipyridamole-aspirin (AGGRENOX) 200-25 MG 12hr capsule Take 1 capsule by mouth 2 (two) times daily.    latanoprost (XALATAN) 0.005 % ophthalmic solution Place 1 drop into both eyes every evening. Refills: 3     levothyroxine (SYNTHROID, LEVOTHROID) 88 MCG tablet Take 1 tablet by mouth daily. Refills: 1    mirtazapine (REMERON) 45 MG tablet Take 45 mg by mouth at bedtime. Refills: 4    oxybutynin (DITROPAN-XL) 10 MG 24 hr tablet Take 10 mg by mouth daily. Refills: 1    simvastatin (ZOCOR) 80 MG tablet Take 80 mg by mouth daily. Refills: 1      STOP taking these medications     lisinopril-hydrochlorothiazide (PRINZIDE,ZESTORETIC) 20-25 MG tablet      VIIBRYD 40 MG TABS         Today   VITAL SIGNS:  Blood pressure (!) 143/67, pulse 80, temperature 98.5 F (36.9 C), temperature source Oral, resp. rate 17, height 5\' 6"  (1.676 m), weight 103 kg (227 lb 1.6 oz), SpO2 93 %.  I/O:   Intake/Output Summary (Last 24 hours) at 06/15/17 1041 Last data filed at 06/15/17 1024  Gross per 24 hour  Intake              723 ml  Output              600 ml  Net              123 ml    PHYSICAL EXAMINATION:  Physical Exam  GENERAL:  81 y.o.-year-old patient lying in the bed with no acute distress.  LUNGS: Normal breath sounds bilaterally, no wheezing, rales,rhonchi or crepitation. No use of accessory muscles of respiration.  CARDIOVASCULAR: S1, S2 normal. No murmurs, rubs, or gallops.  ABDOMEN: Soft, non-tender, non-distended. Bowel sounds present. No organomegaly or mass.  NEUROLOGIC: left weakness PSYCHIATRIC: The patient is alert and awake SKIN: No obvious rash, lesion, or ulcer.   DATA REVIEW:   CBC  Recent Labs Lab 06/14/17 0531  WBC 8.9  HGB 12.1  HCT 35.5  PLT 248    Chemistries   Recent Labs Lab 06/10/17 0845  06/13/17 0531  NA 141  < > 137  K 3.4*  < > 3.7  CL 100*  < > 98*  CO2 30  < > 28  GLUCOSE 138*  < > 121*  BUN 27*  < > 26*  CREATININE 1.41*  < > 1.14*  CALCIUM 9.0  < > 8.9  AST 65*  --   --   ALT 46  --   --   ALKPHOS 57  --   --   BILITOT 0.7  --   --   < > = values in this interval not displayed.  Cardiac Enzymes  Recent Labs Lab  06/10/17 1826  TROPONINI 2.18*    Microbiology Results  No results found for this or any previous visit.  RADIOLOGY:  Mr Brain Wo Contrast  Result Date: 06/13/2017 CLINICAL DATA:  New onset headache and fatigue. Presenting to the emergency department 3 days ago. Symptoms for 1 week. Acute change in mental status yesterday. EXAM: MRI HEAD WITHOUT CONTRAST TECHNIQUE: Multiplanar, multiecho pulse  sequences of the brain and surrounding structures were obtained without intravenous contrast. COMPARISON:  CT head without contrast 06/12/2017. MRI brain 04/06/2010 FINDINGS: Brain: A punctate acute nonhemorrhagic infarct is noted within the posterior right external capsule. There is no associated hemorrhage. There may be an area of focal restricted diffusion along the left lateral aspect of the corpus callosum in the posterior left frontal lobe. No acute cortical infarct is present. There is no acute hemorrhage or mass lesion. Remote right MCA territory infarcts are present with extensive involvement of the right parietal and to lesser extent temporal lobes. There is ex vacuo dilation associated with these more remote infarcts. Remote lacunar infarcts are again noted in the basal ganglia bilaterally, left greater than right. A remote lacunar infarct is present in the right thalamus, new since 2011. Remote lacunar infarcts of the brainstem and right cerebellum are stable. Vascular: Flow is present in the major intracranial arteries. Skull and upper cervical spine: The skullbase is within normal limits. The craniocervical junction is normal. Sinuses/Orbits: Mild mucosal thickening is present in the maxillary sinuses bilaterally, worse on the right. There is minimal mucosal thickening within the ethmoid air cells. The mastoid air cells are clear. The remaining paranasal sinuses are clear. IMPRESSION: 1. Two punctate foci of restricted diffusion suggesting acute nonhemorrhagic infarcts involving posterior right  external capsule and lateral posterior left corpus callosum. 2. Large remote cortical infarcts in the right MCA territory are stable. 3. Remote lacunar infarcts of the basal ganglia, brainstem, and cerebellum are similar the prior study. A least 1 new right thalamic lacunar infarct is present since 2011. This is not acute. Electronically Signed   By: Marin Roberts M.D.   On: 06/13/2017 11:31   US Carotid Bilateral  Result Date: 06/14/2017 CLINICAL DATA:  CVA. EXAM: BILATERAL CAROTID DUPLEX ULTRASOUND TECHNIQUE: Wallace Cullens scale imaging, color Doppler and duplex ultrasound were performed of bilateral carotid and vertebral arteries in the neck. COMPARISON:  MRI 06/13/2017.  CT 06/12/2017. FINDINGS: Criteria: Quantification of carotid stenosis is based on velocity parameters that correlate the residual internal carotid diameter with NASCET-based stenosis levels, using the diameter of the distal internal carotid lumen as the denominator for stenosis measurement. The following velocity measurements were obtained: RIGHT ICA:  93/33 cm/sec CCA:  119/28 cm/sec SYSTOLIC ICA/CCA RATIO:  0.8 DIASTOLIC ICA/CCA RATIO:  1.2 ECA:  88 cm/sec LEFT ICA:  94/23 cm/sec CCA:  121/18 cm/sec SYSTOLIC ICA/CCA RATIO:  0.8 DIASTOLIC ICA/CCA RATIO:  1.3 ECA:  85 cm/sec RIGHT CAROTID ARTERY: Mild right carotid bifurcation proximal ICA atherosclerotic vascular disease. No flow limiting stenosis. RIGHT VERTEBRAL ARTERY:  Patent with antegrade flow. LEFT CAROTID ARTERY: Mild left common carotid, carotid bifurcation, proximal ICA atherosclerotic vascular disease. No flow limiting stenosis. LEFT VERTEBRAL ARTERY:  Patent with antegrade flow. IMPRESSION: 1. Mild bilateral carotid atherosclerotic vascular disease. No flow limiting stenosis. Degree of stenosis less than 50% bilaterally. 2. Vertebrals are patent antegrade flow. Electronically Signed   By: Maisie Fus  Register   On: 06/14/2017 06:01    Follow up with PCP in 1 week.  Management  plans discussed with the patient, family and they are in agreement.  CODE STATUS:     Code Status Orders        Start     Ordered   06/10/17 1103  Full code  Continuous     06/10/17 1103    Code Status History    Date Active Date Inactive Code Status Order ID Comments User Context  This patient has a current code status but no historical code status.    Advance Directive Documentation     Most Recent Value  Type of Advance Directive  Healthcare Power of Attorney  Pre-existing out of facility DNR order (yellow form or pink MOST form)  -  "MOST" Form in Place?  -      TOTAL TIME TAKING CARE OF THIS PATIENT ON DAY OF DISCHARGE: more than 30 minutes.   Milagros Loll R M.D on 06/15/2017 at 10:41 AM  Between 7am to 6pm - Pager - [phone]  After 6pm go to [email or web address] - password [email or web address] [facility]  [facility] [place] [facility]  Office  [phone]  CC: Primary care physician; Lauro Regulus, MD  Note: This dictation was prepared with Dragon dictation along with smaller phrase technology. Any transcriptional errors that result from this process are unintentional.

## 2017-06-15 NOTE — Evaluation (Signed)
Occupational Therapy Evaluation Patient Details Name: Shawna [name] MRN: [id number] DOB: 21-Nov-1935 Today's Date: 06/15/2017    History of Present Illness Pt is a 81 y/o F who presented due to LE swelling, headache, fatigue.  Started on Lasix with PCP with improved leg swelling.  Troponin 0.93 in the ED. EKG shows no acute ST changes. Cardiac cath performed on 06/13/17 and per cardiology: "Cardiac catheterization revealed nonobstructive coronary artery disease, suggesting that the elevated troponin is related to supply-demand mismatch." CT with no acute findings.  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  Elevated d-dimer, no chest pain SOB or tachycardia or hypoxia, unlikely PE per Internal Medicine.  LE dopplers negtive for DVT.  Pt's PMH includes CVA, brain surgery, SDH, seizures, tuberculosis.     Clinical Impression   Pt seen for OT evaluation this date. Prior to hospital admission, pt was independent with basic ADL and ambulating with a RW. Pt lives a supportive spouse.  Currently pt requires moderate assist with bed mobility, transfers, and ambulation with RUE NWBing due to recent radial cardiac catheterization (through 6pm 06/15/17). Pt/dtr educated in energy conservation strategies including work simplification strategies, falls prevention, AE/DME, and home/routine modifications to maximize safety and functional independence while minimizing falls risk and energy expenditure. Pt/dtr verbalized understanding of all education/training provided via verbal instruction and visual demonstration.  Pt would benefit from skilled OT to address noted impairments and functional limitations (see below for any additional details).  Upon hospital discharge, recommend pt discharge to STR in order to maximize return to PLOF.    Follow Up Recommendations  SNF    Equipment Recommendations  Other (comment) (sock aid)    Recommendations for Other Services       Precautions  / Restrictions Precautions Precautions: Fall Restrictions Weight Bearing Restrictions: Yes RUE Weight Bearing: Non weight bearing Other Position/Activity Restrictions: NWB RUE for 48 hrs s/p RUE cardiac cath on 10/15.        Mobility Bed Mobility Overal bed mobility: Needs Assistance Bed Mobility: Supine to Sit;Sit to Supine     Supine to sit: Mod assist Sit to supine: Mod assist      Transfers Overall transfer level: Needs assistance Equipment used: 1 person hand held assist Transfers: Sit to/from Stand Sit to Stand: Mod assist         General transfer comment: Cues for hand placement and not to WB through RUE    Balance Overall balance assessment: Needs assistance;History of Falls Sitting-balance support: No upper extremity supported;Feet supported Sitting balance-Leahy Scale: Fair     Standing balance support: Single extremity supported;During functional activity Standing balance-Leahy Scale: Poor Standing balance comment: Pt relies on UE support for static and dynamic activities                           ADL either performed or assessed with clinical judgement   ADL Overall ADL's : Needs assistance/impaired Eating/Feeding: Set up;Bed level   Grooming: Bed level;Set up   Upper Body Bathing: Moderate assistance;Minimal assistance;Sitting   Lower Body Bathing: Bed level;Maximal assistance   Upper Body Dressing : Sitting;Minimal assistance;Moderate assistance   Lower Body Dressing: Sitting/lateral leans;Maximal assistance Lower Body Dressing Details (indicate cue type and reason): pt/dtr educated in use of AE For LB dressing to maximize safety functional independence while minimizing energy expenditure Toilet Transfer: Moderate assistance;BSC;Ambulation;RW           Functional mobility during ADLs: Rolling  walker;Moderate assistance General ADL Comments: generally max assist for LB ADL and min-mod assist for UB bathing/dressing tasks      Vision Baseline Vision/History: Wears glasses Wears Glasses: At all times Patient Visual Report: No change from baseline Vision Assessment?: No apparent visual deficits     Perception     Praxis      Pertinent Vitals/Pain Pain Assessment: No/denies pain     Hand Dominance Right   Extremity/Trunk Assessment Upper Extremity Assessment Upper Extremity Assessment: RUE deficits/detail;LUE deficits/detail RUE Deficits / Details: Strength grossly 3/5; intact sensation; did not fully assess due to recent radial cardiac cath; currently NWB'ing through approx 6pm on 10/17 LUE Deficits / Details: Strength grossly 4-/5 (improved from PT evaluation previous date); intact sensation LUE Coordination: decreased fine motor (mild decreased coordination, may be near baseline)   Lower Extremity Assessment Lower Extremity Assessment: Defer to PT evaluation;RLE deficits/detail;LLE deficits/detail RLE Deficits / Details: RLE strength grossly 4-/5, intact sensation LLE Deficits / Details: DF 2+/5, knee E 3/5, hip strength grossly 3/5.  Pt reports she is more weak than her baseline L sided weakness. intact sensation LLE Coordination: decreased gross motor       Communication Communication Communication: Other (comment) (mild dysarthria)   Cognition Arousal/Alertness: Awake/alert Behavior During Therapy: WFL for tasks assessed/performed Overall Cognitive Status: Within Functional Limits for tasks assessed                                     General Comments  daughter present for duration of session    Exercises Other Exercises Other Exercises: pt/dtr educated in energy conservation strategies including work simplification strategies, falls prevention, AE/DME, and home/routine modifications to maximize safety and functional independence while minimizing falls risk and energy expenditure   Shoulder Instructions      Home Living Family/patient expects to be discharged to::  Skilled nursing facility Living Arrangements: Spouse/significant other Available Help at Discharge: Family;Available 24 hours/day Type of Home: House Home Access: Level entry     Home Layout: One level     Bathroom Shower/Tub: Producer, television/film/video: Handicapped height Bathroom Accessibility: Yes   Home Equipment: Environmental consultant - 2 wheels;Cane - single point;Hand held shower head;Grab bars - tub/shower;Shower seat - built in;Grab bars - toilet;Adaptive equipment Adaptive Equipment: Reacher Additional Comments: Pt will have 24/7 supervision available from her husband but he is unable to provide level of physical assist the pt currently requires      Prior Functioning/Environment Level of Independence: Needs assistance  Gait / Transfers Assistance Needed: Pt ambualted with RW at all times, very occiassionally with cane.  1 fall in the past 6 months.   ADL's / Homemaking Assistance Needed: Pt independent with bathing, dressing.  Assists with light cooking, cleaning.  Husband does the driving.             OT Problem List: Decreased strength;Decreased activity tolerance;Impaired balance (sitting and/or standing);Decreased knowledge of use of DME or AE;Impaired UE functional use      OT Treatment/Interventions: Self-care/ADL training;Therapeutic exercise;Therapeutic activities;DME and/or AE instruction;Patient/family education;Energy conservation    OT Goals(Current goals can be found in the care plan section) Acute Rehab OT Goals Patient Stated Goal: rehab before home OT Goal Formulation: With patient/family Time For Goal Achievement: 06/29/17 Potential to Achieve Goals: Good  OT Frequency: Min 2X/week   Barriers to D/C:  Co-evaluation              AM-PAC PT "6 Clicks" Daily Activity     Outcome Measure Help from another person eating meals?: None Help from another person taking care of personal grooming?: None Help from another person toileting,  which includes using toliet, bedpan, or urinal?: A Lot Help from another person bathing (including washing, rinsing, drying)?: A Lot Help from another person to put on and taking off regular upper body clothing?: A Little Help from another person to put on and taking off regular lower body clothing?: A Lot 6 Click Score: 17   End of Session    Activity Tolerance: Patient tolerated treatment well Patient left: in bed;with call bell/phone within reach;with bed alarm set;with family/visitor present  OT Visit Diagnosis: Other abnormalities of gait and mobility (R26.89);Muscle weakness (generalized) (M62.81)                Time: 9562-13081028-1051 OT Time Calculation (min): 23 min Charges:  OT General Charges $OT Visit: 1 Visit OT Evaluation $OT Eval Low Complexity: 1 Low OT Treatments $Self Care/Home Management : 8-22 mins G-Codes: OT G-codes **NOT FOR INPATIENT CLASS** Functional Assessment Tool Used: AM-PAC 6 Clicks Daily Activity;Clinical judgement Functional Limitation: Self care Self Care Current Status (M5784(G8987): At least 40 percent but less than 60 percent impaired, limited or restricted Self Care Goal Status (O9629(G8988): At least 20 percent but less than 40 percent impaired, limited or restricted   Richrd PrimeJamie Stiller, MPH, MS, OTR/L ascom [phone] 06/15/17, 11:16 AM

## 2017-06-15 NOTE — Progress Notes (Signed)
Patient alert and oriented, vss, no complaints of pain.  Escorted out of hospital via EMS.  D/C telemetry/PIV/external catheter.  Report to [facility].

## 2017-06-15 NOTE — Clinical Social Work Placement (Signed)
   CLINICAL SOCIAL WORK PLACEMENT  NOTE  Date:  06/15/2017  Patient Details  Name: Shawna [name] MRN: [id number] Date of Birth: 10/17/1935  Clinical Social Work is seeking post-discharge placement for this patient at the Skilled  Nursing Facility level of care (*CSW will initial, date and re-position this form in  chart as items are completed):  Yes   Patient/family provided with [facility] Clinical Social Work Department's list of facilities offering this level of care within the geographic area requested by the patient (or if unable, by the patient's family).  Yes   Patient/family informed of their freedom to choose among providers that offer the needed level of care, that participate in Medicare, Medicaid or managed care program needed by the patient, have an available bed and are willing to accept the patient.  Yes   Patient/family informed of [facility] ownership interest [facility] and [facility], as well as of the fact that they are under no obligation to receive care at these facilities.  PASRR submitted to EDS on 06/14/17     PASRR number received on 06/14/17     Existing PASRR number confirmed on       FL2 transmitted to all facilities in geographic area requested by pt/family on 06/14/17     FL2 transmitted to all facilities within larger geographic area on       Patient informed that his/her managed care company has contracts with or will negotiate with certain facilities, including the following:        Yes   Patient/family informed of bed offers received.  [facility]     Physician recommends and patient chooses bed at      [facility]  Patient to be transferred to facility by [facility]     Patient family notified on 06/15/17 of transfer.  Name of family member notified:  Despina HiddenJackie Jefferies patient's daughter and husband Fayrene FearingJames were both at bedside.      PHYSICIAN Please sign FL2     Additional Comment:    _______________________________________________ Darleene CleaverAnterhaus, Stpehen Petitjean R, LCSWA 06/15/2017, 12:22 PM

## 2017-06-30 ENCOUNTER — Encounter
Admission: RE | Admit: 2017-06-30 | Discharge: 2017-06-30 | Disposition: A | Discharge status: Home / Self Care | Payer: Medicare Other | Source: Ambulatory Visit | Attending: Internal Medicine | Admitting: Internal Medicine

## 2017-07-06 ENCOUNTER — Encounter: Payer: Self-pay | Admitting: Gerontology

## 2017-07-06 ENCOUNTER — Non-Acute Institutional Stay (SKILLED_NURSING_FACILITY): Payer: Medicare Other | Admitting: Gerontology

## 2017-07-06 DIAGNOSIS — I5030 Unspecified diastolic (congestive) heart failure: Secondary | ICD-10-CM | POA: Diagnosis not present

## 2017-07-06 DIAGNOSIS — I639 Cerebral infarction, unspecified: Secondary | ICD-10-CM

## 2017-07-06 NOTE — Progress Notes (Signed)
Location:    [facility] Room Number: 209B Place of Service:  SNF [phone])  Provider: Lorenso Quarry, NP-C  PCP: Lauro Regulus, MD Patient Care Team: Lauro Regulus, MD as PCP - General (Internal Medicine) Antonieta Iba, MD as Consulting Physician (Cardiology)  Extended Emergency Contact Information Primary Emergency Contact: Freda Munro Address: [address]          [place], [place] [address] [place] of [place] Home Phone: [phone] Work Phone: [phone] Relation: Spouse Secondary Emergency Contact: Burman [place] Home Phone: [phone] Mobile Phone: [phone] Relation: Daughter  Code Status: DNR Goals of care:  Advanced Directive information Advanced Directives 07/06/2017  Does Patient Have a Medical Advance Directive? Yes  Type of Advance Directive Out of facility DNR (pink MOST or yellow form)  Does patient want to make changes to medical advance directive? No - Patient declined  Copy of Healthcare Power of Attorney in Chart? -  Would patient like information on creating a medical advance directive? -     No Known Allergies  Chief Complaint  Patient presents with  . Discharge Note    Discharged from SNF    HPI:  81 y.o. female seen today for discharge evaluation.  Patient was admitted to the facility for rehab following admission to [facility] for acute CVA.  Patient was also thought to have been having an N ST EMI.  Negative cardiac cath except mild CAD, negative MI.  However, patient was also having bilateral lower extremity edema resulting from the chronic diastolic congestive heart failure.  Medications were adjusted in the hospital.  Patient was admitted for rehab for generalized weakness and decreased mobility.  Patient has been working with PT/OT.  Patient has been progressing well.  Patient reports no pain.  Edema in bilateral lower extremities has improved.  No residual  effects noted from the CVA.  Patient is on Aggrenox, Zocor and lisinopril.  Patient denies any further symptoms.  Patient denies n/v/d/f/c/cp/sob/ha/abd pain/dizziness/cough/congestion/pain vital signs stable.  No other complaints..     Past Medical History:  Diagnosis Date  . Cerebral infarction (HCC)   . CKD (chronic kidney disease), stage III (HCC)   . Depression   . DJD (degenerative joint disease)   . Edema   . High cholesterol   . Hyperlipidemia, unspecified   . Hypertension   . Hypothyroidism   . Malaise   . Morbid obesity (HCC)   . Osteoarthritis   . Parathyroid abnormality (HCC)   . Seizures (HCC)    a. following remote stroke.  . Stroke [facility])    a. 1964-->residual right arm wkns.  Previously on coumadin - pt says for just a few yrs.  . Subdural hematoma (HCC)    a. 10/2001 [facility] req temporal frontoparietal craniotomy following fall. ? whether or not pt on coumadin @ time.  Notes indicate yes but pt denies.  . Thyroid disease   . TMJ (dislocation of temporomandibular joint)   . Tuberculosis    a. ~ 1950  . Urinary incontinence   . Weight loss     Past Surgical History:  Procedure Laterality Date  . ABDOMINAL HYSTERECTOMY    . BACK SURGERY    . BRAIN SURGERY    . cataract surgery     2014  . PARATHYROID ADENOMA REMOVAL        reports that  has never smoked. she has never used smokeless tobacco. She reports that she does not  drink alcohol or use drugs. Social History   Socioeconomic History  . Marital status: Married    Spouse name: Not on file  . Number of children: 2  . Years of education: 7414  . Highest education level: Not on file  Social Needs  . Financial resource strain: Not on file  . Food insecurity - worry: Not on file  . Food insecurity - inability: Not on file  . Transportation needs - medical: Not on file  . Transportation needs - non-medical: Not on file  Occupational History  . Not on file  Tobacco Use  . Smoking status: Never Smoker  .  Smokeless tobacco: Never Used  Substance and Sexual Activity  . Alcohol use: No  . Drug use: No  . Sexual activity: Not on file  Other Topics Concern  . Not on file  Social History Narrative   [place].  Active around the house.  Does not routinely exercise or drive.  Does own grocery shopping.   Functional Status Survey:    No Known Allergies  Pertinent  Health Maintenance Due  Topic Date Due  . DEXA SCAN  08/03/2003  . PNA vac Low Risk Adult (2 of 2 - PCV13) 08/30/2005  . INFLUENZA VACCINE  Completed    Medications: Allergies as of 07/06/2017   No Known Allergies     Medication List        Accurate as of 07/06/17  4:07 PM. Always use your most recent med list.          acetaminophen 325 MG tablet Commonly known as:  TYLENOL Take 650 mg every 4 (four) hours as needed by mouth.   cloNIDine 0.2 mg/24hr patch Commonly known as:  CATAPRES - Dosed in mg/24 hr Place 0.2 mg onto the skin once a week.   dipyridamole-aspirin 200-25 MG 12hr capsule Commonly known as:  AGGRENOX Take 1 capsule by mouth 2 (two) times daily.   docusate sodium 100 MG capsule Commonly known as:  COLACE Take 1 capsule (100 mg total) by mouth 2 (two) times daily.   furosemide 20 MG tablet Commonly known as:  LASIX Take 1 tablet (20 mg total) by mouth daily.   latanoprost 0.005 % ophthalmic solution Commonly known as:  XALATAN Place 1 drop into both eyes every evening.   levothyroxine 88 MCG tablet Commonly known as:  SYNTHROID, LEVOTHROID Take 1 tablet by mouth daily.   lisinopril 20 MG tablet Commonly known as:  PRINIVIL,ZESTRIL Take 1 tablet (20 mg total) by mouth daily.   mirtazapine 45 MG tablet Commonly known as:  REMERON Take 45 mg by mouth at bedtime.   oxybutynin 10 MG 24 hr tablet Commonly known as:  DITROPAN-XL Take 10 mg by mouth daily.   polyethylene glycol packet Commonly known as:  MIRALAX / GLYCOLAX Take 17 g by  mouth daily as needed for mild constipation.   simvastatin 80 MG tablet Commonly known as:  ZOCOR Take 80 mg by mouth daily.       Review of Systems  Constitutional: Negative for activity change, appetite change, chills, diaphoresis and fever.  HENT: Negative for congestion, sneezing, sore throat, trouble swallowing and voice change.   Respiratory: Negative for apnea, cough, choking, chest tightness, shortness of breath and wheezing.   Cardiovascular: Positive for leg swelling (improved). Negative for chest pain and palpitations.  Gastrointestinal: Negative for abdominal distention, abdominal pain, constipation, diarrhea and nausea.  Genitourinary: Negative for difficulty  urinating, dysuria, frequency and urgency.  Musculoskeletal: Negative for arthralgias (typical arthritis), back pain, gait problem and myalgias.  Skin: Negative for color change, pallor, rash and wound.  Neurological: Negative for dizziness, tremors, syncope, speech difficulty, weakness, numbness and headaches.  Psychiatric/Behavioral: Negative for agitation and behavioral problems.  All other systems reviewed and are negative.   Vitals:   07/06/17 1548  BP: 118/75  Pulse: 78  Resp: 18  Temp: 97.8 F (36.6 C)  TempSrc: Oral  SpO2: 97%  Weight: 223 lb (101.2 kg)  Height: 5\' 6"  (1.676 m)   Body mass index is 35.99 kg/m. Physical Exam  Constitutional: She is oriented to person, place, and time. Vital signs are normal. She appears well-developed and well-nourished. She is active and cooperative. She does not appear ill. No distress.  HENT:  Head: Normocephalic and atraumatic.  Mouth/Throat: Uvula is midline, oropharynx is clear and moist and mucous membranes are normal. Mucous membranes are not pale, not dry and not cyanotic.  Eyes: Conjunctivae, EOM and lids are normal. Pupils are equal, round, and reactive to light.  Neck: Trachea normal, normal range of motion and full passive range of motion without pain.  Neck supple. No JVD present. No tracheal deviation, no edema and no erythema present. No thyromegaly present.  Cardiovascular: Normal rate, regular rhythm, normal heart sounds, intact distal pulses and normal pulses. Exam reveals no gallop, no distant heart sounds and no friction rub.  No murmur heard. Pulses:      Dorsalis pedis pulses are 2+ on the right side, and 2+ on the left side.  Minimal edema  Pulmonary/Chest: Effort normal and breath sounds normal. No accessory muscle usage. No respiratory distress. She has no decreased breath sounds. She has no wheezes. She has no rhonchi. She has no rales. She exhibits no tenderness.  Abdominal: Soft. Normal appearance and bowel sounds are normal. She exhibits no distension and no ascites. There is no tenderness.  Musculoskeletal: Normal range of motion. She exhibits no edema or tenderness.  Expected osteoarthritis, stiffness  Neurological: She is alert and oriented to person, place, and time. She has normal strength.  Skin: Skin is warm, dry and intact. She is not diaphoretic. No cyanosis. No pallor. Nails show no clubbing.  Psychiatric: She has a normal mood and affect. Her speech is normal and behavior is normal. Judgment and thought content normal. Cognition and memory are normal.  Nursing note and vitals reviewed.   Labs reviewed: Basic Metabolic Panel: Recent Labs    06/10/17 0845 06/11/17 0233 06/13/17 0531  NA 141 142 137  K 3.4* 3.3* 3.7  CL 100* 101 98*  CO2 30 29 28   GLUCOSE 138* 137* 121*  BUN 27* 26* 26*  CREATININE 1.41* 1.16* 1.14*  CALCIUM 9.0 8.9 8.9   Liver Function Tests: Recent Labs    06/10/17 0845  AST 65*  ALT 46  ALKPHOS 57  BILITOT 0.7  PROT 7.6  ALBUMIN 3.5   No results for input(s): LIPASE, AMYLASE in the last 8760 hours. No results for input(s): AMMONIA in the last 8760 hours. CBC: Recent Labs    06/12/17 0447 06/13/17 0531 06/14/17 0531  WBC 8.7 8.7 8.9  HGB 11.9* 11.8* 12.1  HCT 35.5 34.5*  35.5  MCV 93.1 92.0 92.0  PLT 193 225 248   Cardiac Enzymes: Recent Labs    06/10/17 0845 06/10/17 1320 06/10/17 1826  CKTOTAL  --  183  --   TROPONINI 0.93* 2.22* 2.18*   BNP: Invalid  input(s): POCBNP CBG: Recent Labs    06/13/17 0801 06/13/17 2122 06/14/17 1951  GLUCAP 126* 173* 146*    Procedures and Imaging Studies During Stay: Dg Chest 2 View  Result Date: 06/10/2017 CLINICAL DATA:  Headache, leg swelling EXAM: CHEST  2 VIEW COMPARISON:  04/07/2010 FINDINGS: There is mild lingular scarring. There is no focal parenchymal opacity. There is no pleural effusion or pneumothorax. The heart and mediastinal contours are unremarkable. The osseous structures are unremarkable. IMPRESSION: No active cardiopulmonary disease. Electronically Signed   By: Elige Ko   On: 06/10/2017 09:35   Ct Head Wo Contrast  Result Date: 06/12/2017 CLINICAL DATA:  Increased left-sided weakness. History of subdural hematoma and stroke EXAM: CT HEAD WITHOUT CONTRAST TECHNIQUE: Contiguous axial images were obtained from the base of the skull through the vertex without intravenous contrast. COMPARISON:  Two days ago FINDINGS: Brain: Moderate gliosis at the right frontal parietal junction. Prominent white matter low density with midly thin appearance of cortex deep to a right parietal bone flap, stable from priors and likely low-grade gliosis. There is dense gliosis in the superficial right temporal lobe, likely posttraumatic. No acute infarct suspected. Remote lacunar infarct in the left caudate head. Ventriculomegaly that is stable. 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 Chronic microvascular ischemic type change in the cerebral white matter. Vascular: No hyperdense vessel or unexpected calcification. Skull: Previous craniectomy and cranioplasty on the right. No acute or aggressive finding. Sinuses/Orbits: No acute finding.  Bilateral cataract  resection. IMPRESSION: 1. No acute finding or change from prior. 2. Right temporal and frontal parietal encephalomalacia. 3. Right parietal cranioplasty, reportedly related to remote subdural hematoma evacuation. 4. Atrophy and ventriculomegaly. Electronically Signed   By: Marnee Spring M.D.   On: 06/12/2017 12:15   Ct Head Wo Contrast  Result Date: 06/10/2017 CLINICAL DATA:  Pt c/o bilateral leg pain 10/10. Both legs are swollen, red, and warm. Pt seen at doctors office yesterday for same. Pt also c.o headache 10/10. HX stroke and brain surgery. TKV EXAM: CT HEAD WITHOUT CONTRAST TECHNIQUE: Contiguous axial images were obtained from the base of the skull through the vertex without intravenous contrast. COMPARISON:  06/11/2015 FINDINGS: Brain: There changes from a right frontoparietal craniectomy. Focal encephalomalacia is noted of the underlying posterior right frontal and adjacent right parietal lobes. There are no parenchymal masses or mass effect. There is no evidence of a recent infarct. There is no intracranial hemorrhage. There is an old subcortical white matter infarct involving the lateral left frontal lobe. Ventricles are enlarged, to a greater degree than the sulci, similar to the prior CT. No convincing hydrocephalus. No extra-axial masses or abnormal fluid collections. Vascular: No hyperdense vessel or unexpected calcification. Skull: No acute fracture or skull lesion. Sinuses/Orbits: Visualize globes and orbits are unremarkable. Visualized sinuses and mastoid air cells are clear. Other: None. IMPRESSION: 1. No acute intracranial abnormalities. 2. Changes from a previous right frontal parietal craniectomy and resection of a portion of the posterior right frontal lobe adjacent parietal lobes. 3. Small area of old left frontal lobe subcortical white matter infarction. 4. Stable ventriculomegaly without convincing hydrocephalus. Electronically Signed   By: Amie Portland M.D.   On: 06/10/2017 09:24     Mr Brain Wo Contrast  Result Date: 06/13/2017 CLINICAL DATA:  New onset headache and fatigue. Presenting to the emergency department 3 days ago. Symptoms for 1 week. Acute change in mental status yesterday. EXAM: MRI HEAD WITHOUT CONTRAST TECHNIQUE:  Multiplanar, multiecho pulse sequences of the brain and surrounding structures were obtained without intravenous contrast. COMPARISON:  CT head without contrast 06/12/2017. MRI brain 04/06/2010 FINDINGS: Brain: A punctate acute nonhemorrhagic infarct is noted within the posterior right external capsule. There is no associated hemorrhage. There may be an area of focal restricted diffusion along the left lateral aspect of the corpus callosum in the posterior left frontal lobe. No acute cortical infarct is present. There is no acute hemorrhage or mass lesion. Remote right MCA territory infarcts are present with extensive involvement of the right parietal and to lesser extent temporal lobes. There is ex vacuo dilation associated with these more remote infarcts. Remote lacunar infarcts are again noted in the basal ganglia bilaterally, left greater than right. A remote lacunar infarct is present in the right thalamus, new since 2011. Remote lacunar infarcts of the brainstem and right cerebellum are stable. Vascular: Flow is present in the major intracranial arteries. Skull and upper cervical spine: The skullbase is within normal limits. The craniocervical junction is normal. Sinuses/Orbits: Mild mucosal thickening is present in the maxillary sinuses bilaterally, worse on the right. There is minimal mucosal thickening within the ethmoid air cells. The mastoid air cells are clear. The remaining paranasal sinuses are clear. IMPRESSION: 1. Two punctate foci of restricted diffusion suggesting acute nonhemorrhagic infarcts involving posterior right external capsule and lateral posterior left corpus callosum. 2. Large remote cortical infarcts in the right MCA territory are  stable. 3. Remote lacunar infarcts of the basal ganglia, brainstem, and cerebellum are similar the prior study. A least 1 new right thalamic lacunar infarct is present since 2011. This is not acute. Electronically Signed   By: Marin Robertshristopher  Mattern M.D.   On: 06/13/2017 11:31   [place] Carotid Bilateral  Result Date: 06/14/2017 CLINICAL DATA:  CVA. EXAM: BILATERAL CAROTID DUPLEX ULTRASOUND TECHNIQUE: Wallace CullensGray scale imaging, color Doppler and duplex ultrasound were performed of bilateral carotid and vertebral arteries in the neck. COMPARISON:  MRI 06/13/2017.  CT 06/12/2017. FINDINGS: Criteria: Quantification of carotid stenosis is based on velocity parameters that correlate the residual internal carotid diameter with NASCET-based stenosis levels, using the diameter of the distal internal carotid lumen as the denominator for stenosis measurement. The following velocity measurements were obtained: RIGHT ICA:  93/33 cm/sec CCA:  119/28 cm/sec SYSTOLIC ICA/CCA RATIO:  0.8 DIASTOLIC ICA/CCA RATIO:  1.2 ECA:  88 cm/sec LEFT ICA:  94/23 cm/sec CCA:  121/18 cm/sec SYSTOLIC ICA/CCA RATIO:  0.8 DIASTOLIC ICA/CCA RATIO:  1.3 ECA:  85 cm/sec RIGHT CAROTID ARTERY: Mild right carotid bifurcation proximal ICA atherosclerotic vascular disease. No flow limiting stenosis. RIGHT VERTEBRAL ARTERY:  Patent with antegrade flow. LEFT CAROTID ARTERY: Mild left common carotid, carotid bifurcation, proximal ICA atherosclerotic vascular disease. No flow limiting stenosis. LEFT VERTEBRAL ARTERY:  Patent with antegrade flow. IMPRESSION: 1. Mild bilateral carotid atherosclerotic vascular disease. No flow limiting stenosis. Degree of stenosis less than 50% bilaterally. 2. Vertebrals are patent antegrade flow. Electronically Signed   By: Maisie Fushomas  Register   On: 06/14/2017 06:01   [place] Venous Img Lower Bilateral  Result Date: 06/11/2017 CLINICAL DATA:  Bilateral lower extremity pain and edema for the past 6 days. Evaluate for DVT. EXAM: BILATERAL  LOWER EXTREMITY VENOUS DOPPLER ULTRASOUND TECHNIQUE: Gray-scale sonography with graded compression, as well as color Doppler and duplex ultrasound were performed to evaluate the lower extremity deep venous systems from the level of the common femoral vein and including the common femoral, femoral, profunda femoral, popliteal and calf veins including  the posterior tibial, peroneal and gastrocnemius veins when visible. The superficial great saphenous vein was also interrogated. Spectral Doppler was utilized to evaluate flow at rest and with distal augmentation maneuvers in the common femoral, femoral and popliteal veins. COMPARISON:  None. FINDINGS: RIGHT LOWER EXTREMITY Common Femoral Vein: No evidence of thrombus. Normal compressibility, respiratory phasicity and response to augmentation. Saphenofemoral Junction: No evidence of thrombus. Normal compressibility and flow on color Doppler imaging. Profunda Femoral Vein: No evidence of thrombus. Normal compressibility and flow on color Doppler imaging. Femoral Vein: No evidence of thrombus. Normal compressibility, respiratory phasicity and response to augmentation. Popliteal Vein: No evidence of thrombus. Normal compressibility, respiratory phasicity and response to augmentation. Calf Veins: No evidence of thrombus. Normal compressibility and flow on color Doppler imaging. Superficial Great Saphenous Vein: No evidence of thrombus. Normal compressibility. Venous Reflux:  None. Other Findings:  None. LEFT LOWER EXTREMITY Common Femoral Vein: No evidence of thrombus. Normal compressibility, respiratory phasicity and response to augmentation. Saphenofemoral Junction: No evidence of thrombus. Normal compressibility and flow on color Doppler imaging. Profunda Femoral Vein: No evidence of thrombus. Normal compressibility and flow on color Doppler imaging. Femoral Vein: No evidence of thrombus. Normal compressibility, respiratory phasicity and response to augmentation.  Popliteal Vein: No evidence of thrombus. Normal compressibility, respiratory phasicity and response to augmentation. Calf Veins: No evidence of thrombus. Normal compressibility and flow on color Doppler imaging. Superficial Great Saphenous Vein: No evidence of thrombus. Normal compressibility. Venous Reflux:  None. Other Findings:  None. IMPRESSION: No evidence of DVT within either lower extremity. Electronically Signed   By: Simonne Come M.D.   On: 06/11/2017 12:05   Dg Chest Port 1 View  Result Date: 06/12/2017 CLINICAL DATA:  Hypoxia.  Mental status change EXAM: PORTABLE CHEST 1 VIEW COMPARISON:  Two days ago FINDINGS: Chronic borderline heart size. Mild aortic tortuosity. Chronic elevation the left diaphragm with mild scarring. There is no edema, consolidation, effusion, or pneumothorax. IMPRESSION: Stable from prior.  No evidence of active disease. Electronically Signed   By: Marnee Spring M.D.   On: 06/12/2017 12:01    Assessment/Plan:   1.  Acute CVA (cerebrovascular accident)  Continue PT/OT  Continue exercises as taught by PT/OT  Continue Aggrenox 200-25 mg 1 capsule p.o. every 12 hours  Assist with ADLs as appropriate  Follow-up with neurologist/PCP ASAP after discharge for continuity of care  2.  Diastolic congestive heart failure, unspecified HF chronicity  Daily weights  Elevate legs when at rest  TED hose daily  Continue clonidine 0.2 mg patch weekly  Continue Lasix 20 mg p.o. daily  Continue lisinopril 20 mg p.o. daily  Follow low-sodium diet  Follow-up with cardiologist ASAP after discharge for continuity of care   Patient is being discharged with the following home health services: Home health PT/OT [facility]  Patient is being discharged with the following durable medical equipment: None  Patient has been advised to f/u with their PCP in 1-2 weeks to bring them up to date on their rehab stay.  Social services at facility was  responsible for arranging this appointment.  Pt was provided with a 30 day supply of prescriptions for medications and refills must be obtained from their PCP.  For controlled substances, a more limited supply may be provided adequate until PCP appointment only.  Future labs/tests needed:    Family/ staff Communication:   Total Time:  Documentation:  Face to Face:  Family/Phone:  Brynda Rim, NP-C Geriatrics [facility]  Care [facility] [address], [place] [address] Cell Phone (Mon-Fri 8am-5pm):  [phone] On Call:  [phone] & follow prompts after 5pm & weekends Office Phone:  [phone] Office Fax:  [phone]

## 2017-07-26 DIAGNOSIS — F0150 Vascular dementia without behavioral disturbance: Secondary | ICD-10-CM | POA: Diagnosis present

## 2017-07-28 DIAGNOSIS — I5030 Unspecified diastolic (congestive) heart failure: Secondary | ICD-10-CM | POA: Insufficient documentation

## 2017-10-12 ENCOUNTER — Other Ambulatory Visit: Payer: Self-pay | Admitting: Internal Medicine

## 2017-10-12 DIAGNOSIS — Z1231 Encounter for screening mammogram for malignant neoplasm of breast: Secondary | ICD-10-CM

## 2017-10-18 ENCOUNTER — Ambulatory Visit
Admission: RE | Admit: 2017-10-18 | Discharge: 2017-10-18 | Disposition: A | Discharge status: Home / Self Care | Payer: Medicare Other | Source: Ambulatory Visit | Attending: Internal Medicine | Admitting: Internal Medicine

## 2017-10-18 DIAGNOSIS — Z1231 Encounter for screening mammogram for malignant neoplasm of breast: Secondary | ICD-10-CM

## 2018-01-26 ENCOUNTER — Emergency Department: Payer: Medicare Other

## 2018-01-26 ENCOUNTER — Other Ambulatory Visit: Payer: Self-pay

## 2018-01-26 ENCOUNTER — Inpatient Hospital Stay: Payer: Medicare Other

## 2018-01-26 ENCOUNTER — Inpatient Hospital Stay
Admission: EM | Admit: 2018-01-26 | Discharge: 2018-02-01 | DRG: 066 | Disposition: A | Discharge status: Skilled Nursing Facility | Payer: Medicare Other | Attending: Internal Medicine | Admitting: Internal Medicine

## 2018-01-26 ENCOUNTER — Encounter: Payer: Self-pay | Admitting: Emergency Medicine

## 2018-01-26 DIAGNOSIS — I6389 Other cerebral infarction: Secondary | ICD-10-CM | POA: Diagnosis not present

## 2018-01-26 DIAGNOSIS — Z79899 Other long term (current) drug therapy: Secondary | ICD-10-CM

## 2018-01-26 DIAGNOSIS — E785 Hyperlipidemia, unspecified: Secondary | ICD-10-CM | POA: Diagnosis present

## 2018-01-26 DIAGNOSIS — F0150 Vascular dementia without behavioral disturbance: Secondary | ICD-10-CM | POA: Diagnosis present

## 2018-01-26 DIAGNOSIS — D696 Thrombocytopenia, unspecified: Secondary | ICD-10-CM | POA: Diagnosis present

## 2018-01-26 DIAGNOSIS — R339 Retention of urine, unspecified: Secondary | ICD-10-CM | POA: Diagnosis not present

## 2018-01-26 DIAGNOSIS — R0902 Hypoxemia: Secondary | ICD-10-CM | POA: Diagnosis not present

## 2018-01-26 DIAGNOSIS — R29810 Facial weakness: Secondary | ICD-10-CM | POA: Diagnosis present

## 2018-01-26 DIAGNOSIS — Z8673 Personal history of transient ischemic attack (TIA), and cerebral infarction without residual deficits: Secondary | ICD-10-CM | POA: Diagnosis not present

## 2018-01-26 DIAGNOSIS — H538 Other visual disturbances: Secondary | ICD-10-CM | POA: Diagnosis present

## 2018-01-26 DIAGNOSIS — M1990 Unspecified osteoarthritis, unspecified site: Secondary | ICD-10-CM | POA: Diagnosis present

## 2018-01-26 DIAGNOSIS — R29705 NIHSS score 5: Secondary | ICD-10-CM | POA: Diagnosis present

## 2018-01-26 DIAGNOSIS — I129 Hypertensive chronic kidney disease with stage 1 through stage 4 chronic kidney disease, or unspecified chronic kidney disease: Secondary | ICD-10-CM | POA: Diagnosis present

## 2018-01-26 DIAGNOSIS — I639 Cerebral infarction, unspecified: Secondary | ICD-10-CM

## 2018-01-26 DIAGNOSIS — I63532 Cerebral infarction due to unspecified occlusion or stenosis of left posterior cerebral artery: Principal | ICD-10-CM | POA: Diagnosis present

## 2018-01-26 DIAGNOSIS — N183 Chronic kidney disease, stage 3 (moderate): Secondary | ICD-10-CM | POA: Diagnosis present

## 2018-01-26 DIAGNOSIS — E876 Hypokalemia: Secondary | ICD-10-CM | POA: Diagnosis not present

## 2018-01-26 DIAGNOSIS — F329 Major depressive disorder, single episode, unspecified: Secondary | ICD-10-CM | POA: Diagnosis present

## 2018-01-26 DIAGNOSIS — E039 Hypothyroidism, unspecified: Secondary | ICD-10-CM | POA: Diagnosis present

## 2018-01-26 DIAGNOSIS — I6930 Unspecified sequelae of cerebral infarction: Secondary | ICD-10-CM | POA: Diagnosis present

## 2018-01-26 LAB — CBC
HEMATOCRIT: 42.4 % (ref 35.0–47.0)
Hemoglobin: 14.4 g/dL (ref 12.0–16.0)
MCH: 32.2 pg (ref 26.0–34.0)
MCHC: 33.9 g/dL (ref 32.0–36.0)
MCV: 95.1 fL (ref 80.0–100.0)
Platelets: 114 10*3/uL — ABNORMAL LOW (ref 150–440)
RBC: 4.46 MIL/uL (ref 3.80–5.20)
RDW: 13.8 % (ref 11.5–14.5)
WBC: 6.1 10*3/uL (ref 3.6–11.0)

## 2018-01-26 LAB — APTT: aPTT: 30 seconds (ref 24–36)

## 2018-01-26 LAB — COMPREHENSIVE METABOLIC PANEL
ALK PHOS: 84 U/L (ref 38–126)
ALT: 13 U/L — AB (ref 14–54)
ANION GAP: 13 (ref 5–15)
AST: 24 U/L (ref 15–41)
Albumin: 4.8 g/dL (ref 3.5–5.0)
BUN: 32 mg/dL — ABNORMAL HIGH (ref 6–20)
CALCIUM: 9.6 mg/dL (ref 8.9–10.3)
CHLORIDE: 101 mmol/L (ref 101–111)
CO2: 27 mmol/L (ref 22–32)
CREATININE: 1.28 mg/dL — AB (ref 0.44–1.00)
GFR, EST AFRICAN AMERICAN: 45 mL/min — AB (ref >60)
GFR, EST NON AFRICAN AMERICAN: 39 mL/min — AB (ref >60)
Glucose, Bld: 134 mg/dL — ABNORMAL HIGH (ref 65–99)
Potassium: 3.4 mmol/L — ABNORMAL LOW (ref 3.5–5.1)
Sodium: 141 mmol/L (ref 135–145)
Total Bilirubin: 0.9 mg/dL (ref 0.3–1.2)
Total Protein: 8.2 g/dL — ABNORMAL HIGH (ref 6.5–8.1)

## 2018-01-26 LAB — DIFFERENTIAL
BASOS PCT: 1 %
Basophils Absolute: 0 10*3/uL (ref 0–0.1)
EOS PCT: 1 %
Eosinophils Absolute: 0.1 10*3/uL (ref 0–0.7)
Lymphocytes Relative: 20 %
Lymphs Abs: 1.2 10*3/uL (ref 1.0–3.6)
MONO ABS: 0.4 10*3/uL (ref 0.2–0.9)
MONOS PCT: 7 %
NEUTROS ABS: 4.4 10*3/uL (ref 1.4–6.5)
Neutrophils Relative %: 71 %

## 2018-01-26 LAB — TROPONIN I

## 2018-01-26 LAB — PROTIME-INR
INR: 1
PROTHROMBIN TIME: 13.1 s (ref 11.4–15.2)

## 2018-01-26 LAB — GLUCOSE, CAPILLARY: GLUCOSE-CAPILLARY: 134 mg/dL — AB (ref 65–99)

## 2018-01-26 MED ORDER — GADOBENATE DIMEGLUMINE 529 MG/ML IV SOLN
20.0000 mL | Freq: Once | INTRAVENOUS | Status: AC | PRN
  Administered 2018-01-26: 20 mL via INTRAVENOUS

## 2018-01-26 MED ORDER — LISINOPRIL 20 MG PO TABS
20.0000 mg | ORAL_TABLET | Freq: Every day | ORAL | Status: DC
  Administered 2018-01-27 – 2018-01-31 (×5): 20 mg via ORAL
  Filled 2018-01-26 (×6): qty 1

## 2018-01-26 MED ORDER — MIRTAZAPINE 15 MG PO TABS
45.0000 mg | ORAL_TABLET | Freq: Every day | ORAL | Status: DC
  Administered 2018-01-26 – 2018-01-28 (×3): 45 mg via ORAL
  Filled 2018-01-26 (×3): qty 3

## 2018-01-26 MED ORDER — ASPIRIN 81 MG PO CHEW
324.0000 mg | CHEWABLE_TABLET | Freq: Once | ORAL | Status: AC
  Administered 2018-01-26: 324 mg via ORAL
  Filled 2018-01-26: qty 4

## 2018-01-26 MED ORDER — VILAZODONE HCL 40 MG PO TABS
40.0000 mg | ORAL_TABLET | ORAL | Status: DC
  Filled 2018-01-26: qty 1

## 2018-01-26 MED ORDER — ACETAMINOPHEN 160 MG/5ML PO SOLN
650.0000 mg | ORAL | Status: DC | PRN
  Administered 2018-02-01: 650 mg
  Filled 2018-01-26: qty 20.3

## 2018-01-26 MED ORDER — CLOPIDOGREL BISULFATE 75 MG PO TABS
75.0000 mg | ORAL_TABLET | Freq: Every day | ORAL | Status: DC
  Administered 2018-01-27: 75 mg via ORAL
  Filled 2018-01-26: qty 1

## 2018-01-26 MED ORDER — LISINOPRIL-HYDROCHLOROTHIAZIDE 20-25 MG PO TABS: 1.0000 | ORAL_TABLET | Freq: Every day | ORAL | Status: DC

## 2018-01-26 MED ORDER — ENOXAPARIN SODIUM 40 MG/0.4ML ~~LOC~~ SOLN
40.0000 mg | SUBCUTANEOUS | Status: DC
  Administered 2018-01-26 – 2018-01-31 (×6): 40 mg via SUBCUTANEOUS
  Filled 2018-01-26 (×6): qty 0.4

## 2018-01-26 MED ORDER — STROKE: EARLY STAGES OF RECOVERY BOOK
Freq: Once | Status: AC
  Administered 2018-01-26: 20:00:00

## 2018-01-26 MED ORDER — ASPIRIN EC 81 MG PO TBEC
81.0000 mg | DELAYED_RELEASE_TABLET | Freq: Every day | ORAL | Status: DC
  Administered 2018-01-27: 81 mg via ORAL
  Filled 2018-01-26: qty 1

## 2018-01-26 MED ORDER — CLOPIDOGREL BISULFATE 75 MG PO TABS
75.0000 mg | ORAL_TABLET | ORAL | Status: AC
  Administered 2018-01-26: 75 mg via ORAL
  Filled 2018-01-26: qty 1

## 2018-01-26 MED ORDER — POLYETHYLENE GLYCOL 3350 17 G PO PACK
17.0000 g | PACK | Freq: Every day | ORAL | Status: DC | PRN
  Administered 2018-01-29: 09:00:00 17 g via ORAL
  Filled 2018-01-26 (×2): qty 1

## 2018-01-26 MED ORDER — LEVOTHYROXINE SODIUM 88 MCG PO TABS
88.0000 ug | ORAL_TABLET | Freq: Every day | ORAL | Status: DC
  Administered 2018-01-27 – 2018-02-01 (×7): 88 ug via ORAL
  Filled 2018-01-26 (×6): qty 1

## 2018-01-26 MED ORDER — LATANOPROST 0.005 % OP SOLN
1.0000 [drp] | Freq: Every day | OPHTHALMIC | Status: DC
  Administered 2018-01-26 – 2018-01-31 (×6): 1 [drp] via OPHTHALMIC
  Filled 2018-01-26: qty 2.5

## 2018-01-26 MED ORDER — ACETAMINOPHEN 325 MG PO TABS: 650.0000 mg | ORAL_TABLET | ORAL | Status: DC | PRN

## 2018-01-26 MED ORDER — HYDROCHLOROTHIAZIDE 25 MG PO TABS
25.0000 mg | ORAL_TABLET | Freq: Every day | ORAL | Status: DC
  Administered 2018-01-27 – 2018-02-01 (×6): 25 mg via ORAL
  Filled 2018-01-26 (×6): qty 1

## 2018-01-26 MED ORDER — ACETAMINOPHEN 650 MG RE SUPP: 650.0000 mg | RECTAL | Status: DC | PRN

## 2018-01-26 MED ORDER — CLONIDINE HCL 0.3 MG/24HR TD PTWK
0.3000 mg | MEDICATED_PATCH | TRANSDERMAL | Status: DC
  Administered 2018-01-28: 09:00:00 0.3 mg via TRANSDERMAL
  Filled 2018-01-26: qty 1

## 2018-01-26 MED ORDER — ATORVASTATIN CALCIUM 20 MG PO TABS
80.0000 mg | ORAL_TABLET | Freq: Every day | ORAL | Status: DC
  Administered 2018-01-27 – 2018-02-01 (×6): 80 mg via ORAL
  Filled 2018-01-26 (×6): qty 4

## 2018-01-26 NOTE — ED Provider Notes (Signed)
[facility] Emergency Department Provider Note   ____________________________________________   First MD Initiated Contact with Patient 01/26/18 1733     (approximate)  I have reviewed the triage vital signs and the nursing notes.   HISTORY  Chief Complaint Cerebrovascular Accident   HPI Shawna [name] is a 82 y.o. female Who was last seen well at 9:30. Her husband left and then came back at 12:30. She reports about 5 minutes after he left she began having her symptoms. She has some right arm weakness and palmar drift she has right sided facial drooping. He also complains of some vision changes.   Past Medical History:  Diagnosis Date  . Cerebral infarction (HCC)   . CKD (chronic kidney disease), stage III (HCC)   . Depression   . DJD (degenerative joint disease)   . Edema   . High cholesterol   . Hyperlipidemia, unspecified   . Hypertension   . Hypothyroidism   . Malaise   . Morbid obesity (HCC)   . Osteoarthritis   . Parathyroid abnormality (HCC)   . Seizures (HCC)    a. following remote stroke.  . Stroke [facility])    a. 1964-->residual right arm wkns.  Previously on coumadin - pt says for just a few yrs.  . Subdural hematoma (HCC)    a. 10/2001 [facility] req temporal frontoparietal craniotomy following fall. ? whether or not pt on coumadin @ time.  Notes indicate yes but pt denies.  . Thyroid disease   . TMJ (dislocation of temporomandibular joint)   . Tuberculosis    a. ~ 1950  . Urinary incontinence   . Weight loss     Patient Active Problem List   Diagnosis Date Noted  . Diastolic congestive heart failure (HCC) 07/28/2017  . Troponin I above reference range 06/15/2017  . Acute CVA (cerebrovascular accident) (HCC) 06/15/2017  . Otalgia of both ears 06/08/2017  . Right leg pain 06/08/2017  . Dizzy 06/08/2017    Past Surgical History:  Procedure Laterality Date  . ABDOMINAL HYSTERECTOMY    . BACK SURGERY    . BRAIN SURGERY    .  cataract surgery     2014  . LEFT HEART CATH AND CORONARY ANGIOGRAPHY N/A 06/13/2017   Procedure: LEFT HEART CATH AND CORONARY ANGIOGRAPHY;  Surgeon: Iran Ouch, MD;  Location: [facility] INVASIVE CV LAB;  Service: Cardiovascular;  Laterality: N/A;  . PARATHYROID ADENOMA REMOVAL      Prior to Admission medications   Medication Sig Start Date End Date Taking? Authorizing Provider  acetaminophen (TYLENOL) 325 MG tablet Take 650 mg every 4 (four) hours as needed by mouth.    [provider]  cloNIDine (CATAPRES - DOSED IN MG/24 HR) 0.2 mg/24hr patch Place 0.2 mg onto the skin once a week.    [provider]  dipyridamole-aspirin (AGGRENOX) 200-25 MG 12hr capsule Take 1 capsule by mouth 2 (two) times daily. 05/29/15   [provider]  docusate sodium (COLACE) 100 MG capsule Take 1 capsule (100 mg total) by mouth 2 (two) times daily. 06/15/17   Milagros Loll, MD  furosemide (LASIX) 20 MG tablet Take 1 tablet (20 mg total) by mouth daily. 06/16/17   Milagros Loll, MD  latanoprost (XALATAN) 0.005 % ophthalmic solution Place 1 drop into both eyes every evening.  05/29/15   [provider]  levothyroxine (SYNTHROID, LEVOTHROID) 88 MCG tablet Take 1 tablet by mouth daily.  04/24/15   [provider]  lisinopril (  PRINIVIL,ZESTRIL) 20 MG tablet Take 1 tablet (20 mg total) by mouth daily. 06/16/17   Milagros Loll, MD  mirtazapine (REMERON) 45 MG tablet Take 45 mg by mouth at bedtime. 05/29/15   [provider]  oxybutynin (DITROPAN-XL) 10 MG 24 hr tablet Take 10 mg by mouth daily. 04/12/15   [provider]  polyethylene glycol (MIRALAX / GLYCOLAX) packet Take 17 g by mouth daily as needed for mild constipation. 06/15/17   Milagros Loll, MD  simvastatin (ZOCOR) 80 MG tablet Take 80 mg by mouth daily. 03/18/15   [provider]    Allergies Patient has no known allergies.  Family History  Problem Relation Age of Onset  . Heart  failure Mother        died @ 62  . Heart attack Father        died @ 61  . Breast cancer Neg Hx     Social History Social History   Tobacco Use  . Smoking status: Never Smoker  . Smokeless tobacco: Never Used  Substance Use Topics  . Alcohol use: No  . Drug use: No    Review of Systems  Constitutional: No fever/chills Eyes: visual changes. ENT: No sore throat. Cardiovascular: Denies chest pain. Respiratory: Denies shortness of breath. Gastrointestinal: No abdominal pain.  No nausea, no vomiting.  No diarrhea.  No constipation. Genitourinary: Negative for dysuria. Musculoskeletal: Negative for back pain. Skin: Negative for rash. Neurological: see history of present illness  ____________________________________________   PHYSICAL EXAM:  VITAL SIGNS: ED Triage Vitals  Enc Vitals Group     BP 01/26/18 1449 (!) 168/82     Pulse Rate 01/26/18 1449 73     Resp 01/26/18 1449 16     Temp 01/26/18 1449 98.7 F (37.1 C)     Temp Source 01/26/18 1449 Oral     SpO2 01/26/18 1449 95 %     Weight 01/26/18 1450 228 lb (103.4 kg)     Height 01/26/18 1450  (1.676 m)     Head Circumference --      Peak Flow --      Pain Score 01/26/18 1450 0     Pain Loc --      Pain Edu? --      Excl. in GC? --     Constitutional: Alert and oriented. Well appearing and in no acute distress. Eyes: Conjunctivae are normal. PER. EOMI. Head: Atraumatic. Nose: No congestion/rhinnorhea. Mouth/Throat: Mucous membranes are moist.  Oropharynx non-erythematous. Neck: No stridor.   Cardiovascular: Normal rate, regular rhythm. Grossly normal heart sounds.  Good peripheral circulation. Respiratory: Normal respiratory effort.  No retractions. Lungs CTAB. Gastrointestinal: Soft and nontender. No distention. No abdominal bruits. No CVA tenderness. Musculoskeletal: No lower extremity tenderness nor edema.  No joint effusions. Neurologic:  Normal speech and language. right-sided facial droop  patient complains of visual changes as well and she has a right sided arm drift. Skin:  Skin is warm, dry and intact. No rash noted. Psychiatric: Mood and affect are normal. Speech and behavior are normal.  ____________________________________________   LABS (all labs ordered are listed, but only abnormal results are displayed)  Labs Reviewed  CBC - Abnormal; Notable for the following components:      Result Value   Platelets 114 (*)    All other components within normal limits  COMPREHENSIVE METABOLIC PANEL - Abnormal; Notable for the following components:   Potassium 3.4 (*)    Glucose, Bld 134 (*)  BUN 32 (*)    Creatinine, Ser 1.28 (*)    Total Protein 8.2 (*)    ALT 13 (*)    GFR calc non Af Amer 39 (*)    GFR calc Af Amer 45 (*)    All other components within normal limits  GLUCOSE, CAPILLARY - Abnormal; Notable for the following components:   Glucose-Capillary 134 (*)    All other components within normal limits  PROTIME-INR  APTT  DIFFERENTIAL  TROPONIN I  CBG MONITORING, ED   ____________________________________________  EKG  EKG read and turbid by me shows normal sinus rhythm rate of 73 left axis no acute changes ____________________________________________  RADIOLOGY  ED MD interpretation: CT read by radiology as no acute abnormalities  Official radiology report(s): Ct Head Wo Contrast  Result Date: 01/26/2018 CLINICAL DATA:  Patient presents to the ED with obvious right sided facial droop and vision changes. Per family, patient's balance is "way off". Grip strength is equal and no drift. No obvious slurred speech at this time. Patient was seen well at 9:30 when husband left for the dentist. Husband returned at 12:30 and patient was having symptoms. Patient states it was approx. 5 min after husband left that symptoms began EXAM: CT HEAD WITHOUT CONTRAST TECHNIQUE: Contiguous axial images were obtained from the base of the skull through the vertex without  intravenous contrast. COMPARISON:  06/12/2017 FINDINGS: Brain: No evidence of acute infarction, hemorrhage, hydrocephalus, extra-axial collection or mass lesion/mass effect. There is ventricular greater than sulcal enlargement reflecting a predominance of central volume loss, stable from prior CT. Mild encephalomalacia is noted along the anterior right temporal lobe, with another larger area of encephalomalacia along the posterior right frontal and adjacent parietal lobe, stable from the prior study. Vascular: No hyperdense vessel or unexpected calcification. Skull: Right frontoparietal craniectomy defect, unchanged from the prior study. No acute fracture or skull lesion. Sinuses/Orbits: Visualized globes and orbits are unremarkable. Visualized sinuses and mastoid air cells are clear. Other: None. IMPRESSION: 1. No acute intracranial abnormalities. 2. Stable postoperative changes on the right.  Stable atrophy. Electronically Signed   By: Amie Portland M.D.   On: 01/26/2018 15:29    ____________________________________________   PROCEDURES  Procedure(s) performed:   Procedures  Critical Care performed:  ____________________________________________   INITIAL IMPRESSION / ASSESSMENT AND PLAN / ED COURSE  patient with new onset of right sided neurological abnormalities. Strokelike symptoms. No headache.    Clinical Course as of Jan 26 1754  Thu Jan 26, 2018  1746 AST: 24 [PM]    Clinical Course User Index [PM] Arnaldo Natal, MD     ____________________________________________   FINAL CLINICAL IMPRESSION(S) / ED DIAGNOSES  Final diagnoses:  Cerebrovascular accident (CVA), unspecified mechanism [facility])     ED Discharge Orders    None       Note:  This document was prepared using Dragon voice recognition software and may include unintentional dictation errors.    Arnaldo Natal, MD 01/26/18 [phone]

## 2018-01-26 NOTE — ED Notes (Signed)
ED Provider at bedside. 

## 2018-01-26 NOTE — H&P (Signed)
[facility] [place] [facility]   PATIENT NAME: Shawna [name]    MR#:  [id number]  DATE OF BIRTH:  1936-06-13  DATE OF ADMISSION:  01/26/2018  PRIMARY CARE PHYSICIAN: Lauro Regulus, MD   REQUESTING/REFERRING PHYSICIAN: Dr Dorothea Glassman  CHIEF COMPLAINT:   Chief Complaint  Patient presents with  . Cerebrovascular Accident    HISTORY OF PRESENT ILLNESS:  Shawna [name]  is a 82 y.o. female with a known history of previous stroke and previous subdural hematoma requiring craniotomy.  She was brought to the hospital by family.  She was seen well at 9:20am when the husband went out for an appointment.  He came home at 12:30 PM and noticed things were not right with her.  He felt her memory was off and she was unsteady with walking over to the table.  She also had facial droop and some blurred vision.  The patient was seen in the ER by Dr. Darnelle Catalan and he did not call a code stroke because of the unknown  timeframe of when the patient's symptoms began.  The patient is not the best historian.  Hospitalist services were contacted for admission for stroke.  PAST MEDICAL HISTORY:   Past Medical History:  Diagnosis Date  . Cerebral infarction (HCC)   . CKD (chronic kidney disease), stage III (HCC)   . Depression   . DJD (degenerative joint disease)   . Edema   . High cholesterol   . Hyperlipidemia, unspecified   . Hypertension   . Hypothyroidism   . Malaise   . Morbid obesity (HCC)   . Osteoarthritis   . Parathyroid abnormality (HCC)   . Seizures (HCC)    a. following remote stroke.  . Stroke [facility])    a. 1964-->residual right arm wkns.  Previously on coumadin - pt says for just a few yrs.  . Subdural hematoma (HCC)    a. 10/2001 [facility] req temporal frontoparietal craniotomy following fall. ? whether or not pt on coumadin @ time.  Notes indicate yes but pt denies.  . Thyroid disease   . TMJ (dislocation of temporomandibular joint)   . Tuberculosis     a. ~ 1950  . Urinary incontinence   . Weight loss     PAST SURGICAL HISTORY:   Past Surgical History:  Procedure Laterality Date  . ABDOMINAL HYSTERECTOMY    . BACK SURGERY    . BRAIN SURGERY    . cataract surgery     2014  . LEFT HEART CATH AND CORONARY ANGIOGRAPHY N/A 06/13/2017   Procedure: LEFT HEART CATH AND CORONARY ANGIOGRAPHY;  Surgeon: Iran Ouch, MD;  Location: [facility] INVASIVE CV LAB;  Service: Cardiovascular;  Laterality: N/A;  . PARATHYROID ADENOMA REMOVAL      SOCIAL HISTORY:   Social History   Tobacco Use  . Smoking status: Never Smoker  . Smokeless tobacco: Never Used  Substance Use Topics  . Alcohol use: No    FAMILY HISTORY:   Family History  Problem Relation Age of Onset  . Heart failure Mother        died @ 6  . Heart attack Father        died @ 53  . Breast cancer Neg Hx     DRUG ALLERGIES:  No Known Allergies  REVIEW OF SYSTEMS:  CONSTITUTIONAL: No fever, fatigue or weakness.  EYES: Some blurred vision.  Wears glasses. EARS, NOSE, AND THROAT: No tinnitus or ear pain. No sore throat RESPIRATORY:  No cough, shortness of breath, wheezing or hemoptysis.  CARDIOVASCULAR: No chest pain, orthopnea, edema.  GASTROINTESTINAL: No nausea, vomiting, diarrhea or abdominal pain. No blood in bowel movements GENITOURINARY: No dysuria, hematuria.  ENDOCRINE: No polyuria, nocturia, hypothyroidism HEMATOLOGY: No anemia, easy bruising or bleeding SKIN: No rash or lesion. MUSCULOSKELETAL: No joint pain or arthritis.   NEUROLOGIC: No tingling, numbness, weakness.  PSYCHIATRY: Positive for depression.   MEDICATIONS AT HOME:   Prior to Admission medications   Medication Sig Start Date End Date Taking? Authorizing Provider  cloNIDine (CATAPRES - DOSED IN MG/24 HR) 0.2 mg/24hr patch Place 0.2 mg onto the skin every Saturday.    Yes [provider]  dipyridamole-aspirin (AGGRENOX) 200-25 MG 12hr capsule Take 1 capsule by mouth 2 (two) times  daily. 05/29/15  Yes [provider]  latanoprost (XALATAN) 0.005 % ophthalmic solution Place 1 drop into both eyes at bedtime.    Yes [provider]  levothyroxine (SYNTHROID, LEVOTHROID) 88 MCG tablet Take 1 tablet by mouth daily.    Yes [provider]  lisinopril (PRINIVIL,ZESTRIL) 20 MG tablet Take 1 tablet (20 mg total) by mouth daily. 06/16/17  Yes Sudini, Wardell Heath, MD  mirtazapine (REMERON) 45 MG tablet Take 45 mg by mouth at bedtime. 05/29/15  Yes [provider]  oxybutynin (DITROPAN-XL) 10 MG 24 hr tablet Take 10 mg by mouth daily. 04/12/15  Yes [provider]  simvastatin (ZOCOR) 80 MG tablet Take 80 mg by mouth daily. 03/18/15  Yes [provider]  acetaminophen (TYLENOL) 325 MG tablet Take 650 mg every 4 (four) hours as needed by mouth.    [provider]  docusate sodium (COLACE) 100 MG capsule Take 1 capsule (100 mg total) by mouth 2 (two) times daily. 06/15/17   Milagros Loll, MD  furosemide (LASIX) 20 MG tablet Take 1 tablet (20 mg total) by mouth daily. 06/16/17   Milagros Loll, MD  polyethylene glycol (MIRALAX / GLYCOLAX) packet Take 17 g by mouth daily as needed for mild constipation. 06/15/17   Milagros Loll, MD      VITAL SIGNS:  Blood pressure (!) 160/74, pulse 74, temperature 98.7 F (37.1 C), temperature source Oral, resp. rate 16, height  (1.676 m), weight 103.4 kg (228 lb), SpO2 97 %.  PHYSICAL EXAMINATION:  GENERAL:  82 y.o.-year-old patient lying in the bed with no acute distress.  EYES: Pupils equal, round, reactive to light and accommodation. No scleral icterus. Extraocular muscles intact.  HEENT: Head atraumatic, normocephalic. Oropharynx and nasopharynx clear.  NECK:  Supple, no jugular venous distention. No thyroid enlargement, no tenderness.  LUNGS: Normal breath sounds bilaterally, no wheezing, rales,rhonchi or crepitation. No use of accessory muscles of respiration.  CARDIOVASCULAR: S1, S2  normal. No murmurs, rubs, or gallops.  ABDOMEN: Soft, nontender, nondistended. Bowel sounds present. No organomegaly or mass.  EXTREMITIES: No pedal edema, cyanosis, or clubbing.  NEUROLOGIC: Vision right eye blurry.  Finger-nose impaired left hand.  Power 5 out of 5 upper and lower extremities.  Heel shin: Intact bilaterally PSYCHIATRIC: The patient is alert and answers some simple questions.  SKIN: No rash, lesion, or ulcer.   LABORATORY PANEL:   CBC Recent Labs  Lab 01/26/18 1510  WBC 6.1  HGB 14.4  HCT 42.4  PLT 114*   ------------------------------------------------------------------------------------------------------------------  Chemistries  Recent Labs  Lab 01/26/18 1510  NA 141  K 3.4*  CL 101  CO2 27  GLUCOSE 134*  BUN 32*  CREATININE 1.28*  CALCIUM 9.6  AST 24  ALT 13*  ALKPHOS 84  BILITOT 0.9   ------------------------------------------------------------------------------------------------------------------  Cardiac Enzymes Recent Labs  Lab 01/26/18 1510  TROPONINI <0.03   ------------------------------------------------------------------------------------------------------------------  RADIOLOGY:  Ct Head Wo Contrast  Result Date: 01/26/2018 CLINICAL DATA:  Patient presents to the ED with obvious right sided facial droop and vision changes. Per family, patient's balance is "way off". Grip strength is equal and no drift. No obvious slurred speech at this time. Patient was seen well at 9:30 when husband left for the dentist. Husband returned at 12:30 and patient was having symptoms. Patient states it was approx. 5 min after husband left that symptoms began EXAM: CT HEAD WITHOUT CONTRAST TECHNIQUE: Contiguous axial images were obtained from the base of the skull through the vertex without intravenous contrast. COMPARISON:  06/12/2017 FINDINGS: Brain: No evidence of acute infarction, hemorrhage, hydrocephalus, extra-axial collection or mass lesion/mass  effect. There is ventricular greater than sulcal enlargement reflecting a predominance of central volume loss, stable from prior CT. Mild encephalomalacia is noted along the anterior right temporal lobe, with another larger area of encephalomalacia along the posterior right frontal and adjacent parietal lobe, stable from the prior study. Vascular: No hyperdense vessel or unexpected calcification. Skull: Right frontoparietal craniectomy defect, unchanged from the prior study. No acute fracture or skull lesion. Sinuses/Orbits: Visualized globes and orbits are unremarkable. Visualized sinuses and mastoid air cells are clear. Other: None. IMPRESSION: 1. No acute intracranial abnormalities. 2. Stable postoperative changes on the right.  Stable atrophy. Electronically Signed   By: Amie Portland M.D.   On: 01/26/2018 15:29    EKG:   Normal sinus rhythm 73 bpm, first-degree AV block, left axis deviation, LVH  IMPRESSION AND PLAN:   1.  Acute CVA.  Get MRI of the brain, carotid ultrasound echocardiogram with bubble study.  Change Aggrenox over to aspirin and Plavix.  Get neurology consultation.  Physical therapy, occupational therapy and speech therapy consultations. Stroke swallow screen. 2.  Essential hypertension.  Continue usual medications right now. 3.  Hypothyroidism unspecified on levothyroxine 4.  Hyperlipidemia unspecified change Zocor over to Lipitor and check a lipid profile in the morning. 5.  Depression on Viibryd and Remeron 6.  Send off a urine analysis 7.  Chronic kidney disease stage III  8.  Thrombocytopenia which seems new.  Send off hepatitis C. Continue to monitor.   All the records are reviewed and case discussed with ED provider. Management plans discussed with the patient, family and they are in agreement.  CODE STATUS: full code  TOTAL TIME TAKING CARE OF THIS PATIENT: 50 minutes.    Alford Highland M.D on 01/26/2018 at 7:10 PM  Between 7am to 6pm - Pager -  [phone]  After 6pm call admission pager [phone]  [facility] Office  [phone]  CC: Primary care physician; Lauro Regulus, MD

## 2018-01-26 NOTE — ED Triage Notes (Signed)
Patient presents to the ED with obvious right sided facial droop and vision changes.  Per family, patient's balance is "way off".  Grip strength is equal and no drift.  No obvious slurred speech at this time.  Patient was seen well at 9:30 when husband left for the dentist.  Husband returned at 12:30 and patient was having symptoms. .  Patient states it was approx. 5 min after husband left that symptoms began.

## 2018-01-27 ENCOUNTER — Inpatient Hospital Stay
Admit: 2018-01-27 | Discharge: 2018-01-27 | Disposition: A | Discharge status: Home / Self Care | Payer: Medicare Other | Attending: Internal Medicine | Admitting: Internal Medicine

## 2018-01-27 ENCOUNTER — Inpatient Hospital Stay: Payer: Medicare Other

## 2018-01-27 DIAGNOSIS — I639 Cerebral infarction, unspecified: Secondary | ICD-10-CM

## 2018-01-27 LAB — GLUCOSE, CAPILLARY: Glucose-Capillary: 105 mg/dL — ABNORMAL HIGH (ref 65–99)

## 2018-01-27 LAB — HEMOGLOBIN A1C
HEMOGLOBIN A1C: 5.6 % (ref 4.8–5.6)
MEAN PLASMA GLUCOSE: 114.02 mg/dL

## 2018-01-27 MED ORDER — VILAZODONE HCL 20 MG PO TABS
40.0000 mg | ORAL_TABLET | ORAL | Status: DC
  Administered 2018-01-27 – 2018-01-31 (×5): 40 mg via ORAL
  Filled 2018-01-27 (×7): qty 2

## 2018-01-27 MED ORDER — POTASSIUM CHLORIDE CRYS ER 20 MEQ PO TBCR
20.0000 meq | EXTENDED_RELEASE_TABLET | Freq: Once | ORAL | Status: AC
  Administered 2018-01-27: 20 meq via ORAL
  Filled 2018-01-27: qty 1

## 2018-01-27 MED ORDER — ASPIRIN 325 MG PO TABS
325.0000 mg | ORAL_TABLET | Freq: Every day | ORAL | Status: DC
  Administered 2018-01-27 – 2018-02-01 (×6): 325 mg via ORAL
  Filled 2018-01-27 (×6): qty 1

## 2018-01-27 MED ORDER — POTASSIUM CHLORIDE CRYS ER 20 MEQ PO TBCR
20.0000 meq | EXTENDED_RELEASE_TABLET | Freq: Once | ORAL | Status: DC
  Filled 2018-01-27: qty 1

## 2018-01-27 NOTE — Progress Notes (Signed)
MRI results called by radiologist assistant, MD notified about MRI results, no new orders, continue to monitor.

## 2018-01-27 NOTE — Progress Notes (Signed)
SLP Cancellation Note  Patient Details Name: Shawna [name] MRN: [id number] DOB: 1936/02/05   Cancelled treatment:       Reason Eval/Treat Not Completed: Patient at procedure or test/unavailable(chart reviewed). Attempted to see pt 2x around lunch, and pt was w/ different disciplines. Noted pt's h/o previous stroke and previous subdural hematoma requiring craniotomy. Neurology noted poor memory (unsure of pt's baseline) but speech fluent w/out evidence of aphasia during noted today. ST services will f/u w/ Cognitive-linguistic evaluation tomorrow.     Jerilynn Som, MS, CCC-SLP Prabhjot Maddux 01/27/2018, 4:27 PM

## 2018-01-27 NOTE — Clinical Social Work Placement (Signed)
   CLINICAL SOCIAL WORK PLACEMENT  NOTE  Date:  01/27/2018  Patient Details  Name: Shawna [name] MRN: [id number] Date of Birth: 25-Jul-1936  Clinical Social Work is seeking post-discharge placement for this patient at the Skilled  Nursing Facility level of care (*CSW will initial, date and re-position this form in  chart as items are completed):  Yes   Patient/family provided with [facility] Clinical Social Work Department's list of facilities offering this level of care within the geographic area requested by the patient (or if unable, by the patient's family).  Yes   Patient/family informed of their freedom to choose among providers that offer the needed level of care, that participate in Medicare, Medicaid or managed care program needed by the patient, have an available bed and are willing to accept the patient.  Yes   Patient/family informed of [facility]'s ownership interest in [facility] and [facility], as well as of the fact that they are under no obligation to receive care at these facilities.  PASRR submitted to EDS on 01/27/18     PASRR number received on       Existing PASRR number confirmed on 01/27/18     FL2 transmitted to all facilities in geographic area requested by pt/family on 01/27/18     FL2 transmitted to all facilities within larger geographic area on       Patient informed that his/her managed care company has contracts with or will negotiate with certain facilities, including the following:            Patient/family informed of bed offers received.  Patient chooses bed at       Physician recommends and patient chooses bed at      Patient to be transferred to   on  .  Patient to be transferred to facility by       Patient family notified on   of transfer.  Name of family member notified:        PHYSICIAN       Additional Comment:    _______________________________________________ Ruthe Mannan, LCSWA 01/27/2018, 2:47 PM

## 2018-01-27 NOTE — Consult Note (Signed)
Referring Physician: Pyreddy    Chief Complaint: Blurred vision, facial droop, weakness  HPI: Shawna [name] is an 82 y.o. female with a known history of previous stroke and previous subdural hematoma requiring craniotomy who was brought to the hospital by family.  She was seen well at 9:20am on yesterday when the husband went out for an appointment.  He came home at 12:30 PM and noticed things were not right with her.  He felt her memory was off and she was unsteady with walking over to the table.  She also had facial droop and some blurred vision.  With no improvement was brought in for evaluation.   Initial NIHSS of 5.    Date last known well: Date: 01/26/2018 Time last known well: Time: 09:20 tPA Given: No: Outside time window  Past Medical History:  Diagnosis Date  . Cerebral infarction (HCC)   . CKD (chronic kidney disease), stage III (HCC)   . Depression   . DJD (degenerative joint disease)   . Edema   . High cholesterol   . Hyperlipidemia, unspecified   . Hypertension   . Hypothyroidism   . Malaise   . Morbid obesity (HCC)   . Osteoarthritis   . Parathyroid abnormality (HCC)   . Seizures (HCC)    a. following remote stroke.  . Stroke [facility])    a. 1964-->residual right arm wkns.  Previously on coumadin - pt says for just a few yrs.  . Subdural hematoma (HCC)    a. 10/2001 [facility] req temporal frontoparietal craniotomy following fall. ? whether or not pt on coumadin @ time.  Notes indicate yes but pt denies.  . Thyroid disease   . TMJ (dislocation of temporomandibular joint)   . Tuberculosis    a. ~ 1950  . Urinary incontinence   . Weight loss     Past Surgical History:  Procedure Laterality Date  . ABDOMINAL HYSTERECTOMY    . BACK SURGERY    . BRAIN SURGERY    . cataract surgery     2014  . LEFT HEART CATH AND CORONARY ANGIOGRAPHY N/A 06/13/2017   Procedure: LEFT HEART CATH AND CORONARY ANGIOGRAPHY;  Surgeon: Iran Ouch, MD;  Location: [facility] INVASIVE CV LAB;   Service: Cardiovascular;  Laterality: N/A;  . PARATHYROID ADENOMA REMOVAL      Family History  Problem Relation Age of Onset  . Heart failure Mother        died @ 43  . Heart attack Father        died @ 17  . Breast cancer Neg Hx    Social History:  reports that she has never smoked. She has never used smokeless tobacco. She reports that she does not drink alcohol or use drugs.  Allergies: No Known Allergies  Medications:  I have reviewed the patient's current medications. Prior to Admission:  Medications Prior to Admission  Medication Sig Dispense Refill Last Dose  . acetaminophen (TYLENOL) 325 MG tablet Take 650 mg by mouth every 4 (four) hours as needed for mild pain or moderate pain.    PRN at PRN  . cloNIDine (CATAPRES - DOSED IN MG/24 HR) 0.3 mg/24hr patch Place 1 patch onto the skin once a week.  3 As directed at As directed  . dipyridamole-aspirin (AGGRENOX) 200-25 MG 12hr capsule Take 1 capsule by mouth 2 (two) times daily.   01/26/2018 at 0800  . furosemide (LASIX) 20 MG tablet Take 1 tablet (20 mg total) by mouth daily. 30  tablet  01/26/2018 at 0800  . KLOR-CON M20 20 MEQ tablet Take 20 mEq by mouth daily.  1 01/26/2018 at 0800  . latanoprost (XALATAN) 0.005 % ophthalmic solution Place 1 drop into both eyes at bedtime.   3 01/25/2018 at 2100  . levothyroxine (SYNTHROID, LEVOTHROID) 88 MCG tablet Take 1 tablet by mouth daily.   1 01/26/2018 at 0800  . lisinopril-hydrochlorothiazide (PRINZIDE,ZESTORETIC) 20-25 MG tablet Take 1 tablet by mouth daily.   01/26/2018 at 0800  . mirtazapine (REMERON) 45 MG tablet Take 45 mg by mouth at bedtime.  4 01/25/2018 at 2100  . oxybutynin (DITROPAN-XL) 10 MG 24 hr tablet Take 10 mg by mouth daily.  1 01/26/2018 at 0800  . polyethylene glycol (MIRALAX / GLYCOLAX) packet Take 17 g by mouth daily as needed for mild constipation. 14 each 0 PRN at PRN  . simvastatin (ZOCOR) 80 MG tablet Take 80 mg by mouth daily.  1 01/25/2018 at 1900  . VIIBRYD 40 MG  TABS Take 40 mg by mouth every morning.  1 01/26/2018 at 0800   Scheduled: . aspirin EC  81 mg Oral Daily  . atorvastatin  80 mg Oral q1800  . cloNIDine  0.3 mg Transdermal Weekly  . clopidogrel  75 mg Oral Daily  . enoxaparin (LOVENOX) injection  40 mg Subcutaneous Q24H  . lisinopril  20 mg Oral Daily   And  . hydrochlorothiazide  25 mg Oral Daily  . latanoprost  1 drop Both Eyes QHS  . levothyroxine  88 mcg Oral QAC breakfast  . mirtazapine  45 mg Oral QHS  . Vilazodone HCl  40 mg Oral BH-q7a    ROS: History obtained from the patient  General ROS: weakness Psychological ROS: negative for - behavioral disorder, hallucinations, memory difficulties, mood swings or suicidal ideation Ophthalmic ROS: as noted in HPI ENT ROS: negative for - epistaxis, nasal discharge, oral lesions, sore throat, tinnitus or vertigo Allergy and Immunology ROS: negative for - hives or itchy/watery eyes Hematological and Lymphatic ROS: negative for - bleeding problems, bruising or swollen lymph nodes Endocrine ROS: negative for - galactorrhea, hair pattern changes, polydipsia/polyuria or temperature intolerance Respiratory ROS: negative for - cough, hemoptysis, shortness of breath or wheezing Cardiovascular ROS: negative for - chest pain, dyspnea on exertion, edema or irregular heartbeat Gastrointestinal ROS: negative for - abdominal pain, diarrhea, hematemesis, nausea/vomiting or stool incontinence Genito-Urinary ROS: negative for - dysuria, hematuria, incontinence or urinary frequency/urgency Musculoskeletal ROS: negative for - joint swelling or muscular weakness Neurological ROS: as noted in HPI Dermatological ROS: negative for rash and skin lesion changes  Physical Examination: Blood pressure (!) 163/87, pulse 71, temperature 98 F (36.7 C), temperature source Oral, resp. rate 18, height  (1.676 m), weight 99 kg (218 lb 3.2 oz), SpO2 94 %.  HEENT-  Normocephalic, right sided scar from  craniotomy.  Normal external eye and conjunctiva.  Normal TM's bilaterally.  Normal auditory canals and external ears. Normal external nose, mucus membranes and septum.  Normal pharynx. Cardiovascular- S1, S2 normal, pulses palpable throughout   Lungs- chest clear, no wheezing, rales, normal symmetric air entry Abdomen- soft, non-tender; bowel sounds normal; no masses,  no organomegaly Extremities- no edema Lymph-no adenopathy palpable Musculoskeletal-no joint tenderness, deformity or swelling Skin-warm and dry, no hyperpigmentation, vitiligo, or suspicious lesions  Neurological Examination   Mental Status: Alert, memory poor, easily confused.  Speech fluent without evidence of aphasia.  Able to follow 3 step commands. Cranial Nerves: II: Discs flat bilaterally;  Visual fields grossly normal, pupils equal, round, reactive to light and accommodation III,IV, VI: left ptosis, extra-ocular motions intact bilaterally V,VII: left facial droop, facial light touch sensation decreased on the left VIII: hearing normal bilaterally IX,X: gag reflex present XI: bilateral shoulder shrug XII: midline tongue extension Motor: Right : Upper extremity   5/5    Left:     Upper extremity   5/5  Lower extremity   5-/5     Lower extremity   5/5 Increased RUE tone Sensory: Pinprick and light touch intact throughout, bilaterally in the extremities Deep Tendon Reflexes: 2+ and symmetric with absent AJ's bilaterally Plantars: Right: upgoing   Left: upgoing Cerebellar: Normal finger-to-nose and normal heel-to-shin testing bilaterally Gait: not tested due to safety concerns    Laboratory Studies:  Basic Metabolic Panel: Recent Labs  Lab 01/26/18 1510  NA 141  K 3.4*  CL 101  CO2 27  GLUCOSE 134*  BUN 32*  CREATININE 1.28*  CALCIUM 9.6    Liver Function Tests: Recent Labs  Lab 01/26/18 1510  AST 24  ALT 13*  ALKPHOS 84  BILITOT 0.9  PROT 8.2*  ALBUMIN 4.8   No results for input(s):  LIPASE, AMYLASE in the last 168 hours. No results for input(s): AMMONIA in the last 168 hours.  CBC: Recent Labs  Lab 01/26/18 1510  WBC 6.1  NEUTROABS 4.4  HGB 14.4  HCT 42.4  MCV 95.1  PLT 114*    Cardiac Enzymes: Recent Labs  Lab 01/26/18 1510  TROPONINI <0.03    BNP: Invalid input(s): POCBNP  CBG: Recent Labs  Lab 01/26/18 1455  GLUCAP 134*    Microbiology: No results found for this or any previous visit.  Coagulation Studies: Recent Labs    01/26/18 1510  LABPROT 13.1  INR 1.00    Urinalysis: No results for input(s): COLORURINE, LABSPEC, PHURINE, GLUCOSEU, HGBUR, BILIRUBINUR, KETONESUR, PROTEINUR, UROBILINOGEN, NITRITE, LEUKOCYTESUR in the last 168 hours.  Invalid input(s): APPERANCEUR  Lipid Panel:    Component Value Date/Time   CHOL 143 06/14/2017 1440   TRIG 153 (H) 06/14/2017 1440   HDL 32 (L) 06/14/2017 1440   CHOLHDL 4.5 06/14/2017 1440   VLDL 31 06/14/2017 1440   LDLCALC 80 06/14/2017 1440    HgbA1C:  Lab Results  Component Value Date   HGBA1C 6.1 (H) 06/14/2017    Urine Drug Screen:  No results found for: LABOPIA, COCAINSCRNUR, LABBENZ, AMPHETMU, THCU, LABBARB  Alcohol Level: No results for input(s): ETH in the last 168 hours.  Other results: EKG: sinus rhythm at 73 bpm, 1st degree AV block.  Imaging: Ct Head Wo Contrast  Result Date: 01/26/2018 CLINICAL DATA:  Patient presents to the ED with obvious right sided facial droop and vision changes. Per family, patient's balance is "way off". Grip strength is equal and no drift. No obvious slurred speech at this time. Patient was seen well at 9:30 when husband left for the dentist. Husband returned at 12:30 and patient was having symptoms. Patient states it was approx. 5 min after husband left that symptoms began EXAM: CT HEAD WITHOUT CONTRAST TECHNIQUE: Contiguous axial images were obtained from the base of the skull through the vertex without intravenous contrast. COMPARISON:   06/12/2017 FINDINGS: Brain: No evidence of acute infarction, hemorrhage, hydrocephalus, extra-axial collection or mass lesion/mass effect. There is ventricular greater than sulcal enlargement reflecting a predominance of central volume loss, stable from prior CT. Mild encephalomalacia is noted along the anterior right temporal lobe, with another larger  area of encephalomalacia along the posterior right frontal and adjacent parietal lobe, stable from the prior study. Vascular: No hyperdense vessel or unexpected calcification. Skull: Right frontoparietal craniectomy defect, unchanged from the prior study. No acute fracture or skull lesion. Sinuses/Orbits: Visualized globes and orbits are unremarkable. Visualized sinuses and mastoid air cells are clear. Other: None. IMPRESSION: 1. No acute intracranial abnormalities. 2. Stable postoperative changes on the right.  Stable atrophy. Electronically Signed   By: Amie Portland M.D.   On: 01/26/2018 15:29   Mr Laqueta Jean ZO Contrast  Result Date: 01/26/2018 CLINICAL DATA:  82 y/o F; facial droop, blurred vision, altered mental status, instability. EXAM: MRI HEAD WITHOUT AND WITH CONTRAST TECHNIQUE: Multiplanar, multiecho pulse sequences of the brain and surrounding structures were obtained without and with intravenous contrast. CONTRAST:  20mL MULTIHANCE GADOBENATE DIMEGLUMINE 529 MG/ML IV SOLN COMPARISON:  01/26/2018 CT head.  06/13/2017 MRI head. FINDINGS: Brain: Foci of reduced diffusion within the left lateral thalamus and the left mid hippocampus compatible with acute/early subacute infarction. No associated hemorrhage or mass effect. Very small chronic infarcts are present within the right cerebellar hemisphere and the right hemi pons. There are small chronic lacunar infarcts within the right thalamus and the left caudate head. There is a chronic infarction within the right frontoparietal junction. Nonspecific foci of T2 FLAIR hyperintensity in white matter are  compatible with mild chronic microvascular ischemic changes and there is moderate diffuse brain parenchymal volume loss. No new susceptibility hypointensity to indicate interval intracranial hemorrhage. No focal mass effect, hydrocephalus, effacement of basilar cisterns, or herniation. After administration of intravenous contrast there is NO abnormal enhancement of the brain. Vascular: Normal flow voids. Skull and upper cervical spine: Large right convexity cranioplasty chronic postsurgical changes. Stable small right parietal bone hemangioma. Sinuses/Orbits: Negative. Other: None. IMPRESSION: 1. Foci of acute/early subacute infarction within the left lateral thalamus and the left mid hippocampus. No associated hemorrhage or mass effect. 2. Stable background of chronic infarctions, microvascular ischemic changes, and parenchymal volume loss of the brain. 3. Stable postsurgical changes related to a right convexity cranioplasty. These results will be called to the ordering clinician or representative by the Radiologist Assistant, and communication documented in the PACS or zVision Dashboard. Electronically Signed   By: Mitzi Hansen M.D.   On: 01/26/2018 23:58   US Carotid Bilateral (at [facility] Only)  Result Date: 01/27/2018 CLINICAL DATA:  Small acute left thalamus and hippocampus infarcts EXAM: BILATERAL CAROTID DUPLEX ULTRASOUND TECHNIQUE: Wallace Cullens scale imaging, color Doppler and duplex ultrasound were performed of bilateral carotid and vertebral arteries in the neck. COMPARISON:  01/26/2018, 06/13/2017 FINDINGS: Criteria: Quantification of carotid stenosis is based on velocity parameters that correlate the residual internal carotid diameter with NASCET-based stenosis levels, using the diameter of the distal internal carotid lumen as the denominator for stenosis measurement. The following velocity measurements were obtained: RIGHT ICA: 118/38 cm/sec CCA: 86/16 cm/sec SYSTOLIC ICA/CCA RATIO:  1.4  ECA:  90 cm/sec LEFT ICA: 123/35 cm/sec CCA: 109/24 cm/sec SYSTOLIC ICA/CCA RATIO:  1.1 ECA:  99 cm/sec RIGHT CAROTID ARTERY: Minor echogenic shadowing plaque formation. No hemodynamically significant right ICA stenosis, velocity elevation, or turbulent flow. Degree of narrowing less than 50%. RIGHT VERTEBRAL ARTERY:  Antegrade LEFT CAROTID ARTERY: Similar scattered minor echogenic plaque formation. No hemodynamically significant left ICA stenosis, velocity elevation, or turbulent flow. LEFT VERTEBRAL ARTERY:  Antegrade IMPRESSION: Minor carotid atherosclerosis and tortuosity. No hemodynamically significant ICA stenosis. Degree of narrowing less than 50% bilaterally by  ultrasound criteria. Patent antegrade vertebral flow bilaterally Electronically Signed   By: Judie Petit.  Shick M.D.   On: 01/27/2018 09:30    Assessment: 82 y.o. female presenting with new vision changes, facial droop and difficulty with gait.  MRI of the brain reviewed and shows an acute left thalamic infarct.  Likely secondary to small vessel disease.  Patient on no antiplatelet therapy.  Carotid dopplers show no evidence of hemodynamically significant stenosis.  Echocardiogram pending.  A1c pending.  Stroke Risk Factors - hyperlipidemia and hypertension  Plan: 1. Fasting lipid panel 2. PT consult, OT consult, Speech consult 3. Echocardiogram pending 4. Prophylactic therapy-Antiplatelet med: Aspirin - dose 81mg  daily.  Patient on a statin. 5. NPO until RN stroke swallow screen 6. Telemetry monitoring 7. Frequent neuro checks   Thana Farr, MD Neurology [phone] 01/27/2018, 11:02 AM

## 2018-01-27 NOTE — NC FL2 (Signed)
[place] MEDICAID FL2 LEVEL OF CARE SCREENING TOOL     IDENTIFICATION  Patient Name: Shawna [name] Birthdate: [birth year] Sex: female Admission Date (Current Location): 01/26/2018  [place] and [place] Number:  Chiropodist and Address:  [facility], [address], [place], [place] [address]      Provider Number: [id number]  Attending Physician Name and Address:  Ihor Austin, MD  Relative Name and Phone Number:       Current Level of Care: Hospital Recommended Level of Care: Skilled Nursing Facility Prior Approval Number:    Date Approved/Denied:   PASRR Number: [id number]  Discharge Plan: SNF    Current Diagnoses: Patient Active Problem List   Diagnosis Date Noted  . Stroke (HCC) 01/26/2018  . Diastolic congestive heart failure (HCC) 07/28/2017  . Troponin I above reference range 06/15/2017  . Acute CVA (cerebrovascular accident) (HCC) 06/15/2017  . Otalgia of both ears 06/08/2017  . Right leg pain 06/08/2017  . Dizzy 06/08/2017    Orientation RESPIRATION BLADDER Height & Weight     Self, Time, Situation, Place  Normal Continent Weight: 218 lb 3.2 oz (99 kg) Height:   (167.6 cm)  BEHAVIORAL SYMPTOMS/MOOD NEUROLOGICAL BOWEL NUTRITION STATUS  (None) (CVA) Continent Diet(Heart Healthy )  AMBULATORY STATUS COMMUNICATION OF NEEDS Skin   Extensive Assist Verbally Normal                       Personal Care Assistance Level of Assistance  Bathing, Feeding, Dressing Bathing Assistance: Limited assistance Feeding assistance: Independent Dressing Assistance: Limited assistance     Functional Limitations Info  Sight, Hearing, Speech Sight Info: Adequate Hearing Info: Adequate Speech Info: Adequate    SPECIAL CARE FACTORS FREQUENCY  PT (By licensed PT), OT (By licensed OT)     PT Frequency: 5x/week OT Frequency: 5x/week            Contractures Contractures Info: Not present    Additional Factors Info   Code Status, Allergies Code Status Info: Full Code  Allergies Info: NKA           Current Medications (01/27/2018):  This is the current hospital active medication list Current Facility-Administered Medications  Medication Dose Route Frequency Provider Last Rate Last Dose  . acetaminophen (TYLENOL) tablet 650 mg  650 mg Oral Q4H PRN Alford Highland, MD       Or  . acetaminophen (TYLENOL) solution 650 mg  650 mg Per Tube Q4H PRN Wieting, Richard, MD       Or  . acetaminophen (TYLENOL) suppository 650 mg  650 mg Rectal Q4H PRN Wieting, Richard, MD      . aspirin EC tablet 81 mg  81 mg Oral Daily Alford Highland, MD   81 mg at 01/27/18 1037  . atorvastatin (LIPITOR) tablet 80 mg  80 mg Oral q1800 Wieting, Richard, MD      . cloNIDine (CATAPRES - Dosed in mg/24 hr) patch 0.3 mg  0.3 mg Transdermal Weekly Wieting, Richard, MD      . clopidogrel (PLAVIX) tablet 75 mg  75 mg Oral Daily Alford Highland, MD   75 mg at 01/27/18 1037  . enoxaparin (LOVENOX) injection 40 mg  40 mg Subcutaneous Q24H Alford Highland, MD   40 mg at 01/26/18 2138  . lisinopril (PRINIVIL,ZESTRIL) tablet 20 mg  20 mg Oral Daily Alford Highland, MD   20 mg at 01/27/18 1037   And  . hydrochlorothiazide (HYDRODIURIL) tablet 25 mg  25 mg Oral Daily Alford Highland, MD   25 mg at 01/27/18 1037  . latanoprost (XALATAN) 0.005 % ophthalmic solution 1 drop  1 drop Both Eyes QHS Alford Highland, MD   1 drop at 01/26/18 2138  . levothyroxine (SYNTHROID, LEVOTHROID) tablet 88 mcg  88 mcg Oral QAC breakfast Alford Highland, MD   88 mcg at 01/27/18 1610  . mirtazapine (REMERON) tablet 45 mg  45 mg Oral QHS Alford Highland, MD   45 mg at 01/26/18 2137  . polyethylene glycol (MIRALAX / GLYCOLAX) packet 17 g  17 g Oral Daily PRN Wieting, Richard, MD      . Vilazodone HCl TABS 40 mg  40 mg Oral Ellwood Dense, Richard, MD   40 mg at 01/27/18 0808     Discharge Medications: Please see discharge summary for a list of discharge  medications.  Relevant Imaging Results:  Relevant Lab Results:   Additional Information SSN [id number]  Ruthe Mannan, [place]

## 2018-01-27 NOTE — Clinical Social Work Note (Signed)
Clinical Social Work Assessment  Patient Details  Name: Shawna [name] MRN: [id number] Date of Birth: Mar 12, 1936  Date of referral:  01/27/18               Reason for consult:  Facility Placement                Permission sought to share information with:  Case Manager, Customer service manager, Family Supports Permission granted to share information::  Yes, Verbal Permission Granted  Name::      SNF  Agency::   [facility]   Relationship::     Contact Information:     Housing/Transportation Living arrangements for the past 2 months:  Single Family Home Source of Information:  Patient Patient Interpreter Needed:  None Criminal Activity/Legal Involvement Pertinent to Current Situation/Hospitalization:  No - Comment as needed Significant Relationships:  Adult Children, Spouse, Other Family Members Lives with:  Spouse Do you feel safe going back to the place where you live?  Yes Need for family participation in patient care:  Yes (Comment)  Care giving concerns:  Patient lives at home with husband.    Social Worker assessment / plan:  CSW consulted for facility placement. CSW met with patient, [name] and [name] at bedside. CSW explained role and that therapy has recommended SNF. CSW explained difference between SNF and home care. Patient states that she has a caregiver that comes a couple times a week but feels that SNF would be beneficial. Husband and daughter both agreed. CSW gave patient SNF list and explained the bed search process. Patient reports that she has been to [facility] in the past. Patient is in agreement with bed search. CSW initiated bed search and will give bed offers once received. CSW will continue to follow for discharge planning.   Employment status:  Retired Nurse, adult PT Recommendations:  [place] / Referral to community resources:  Cut Off  Patient/Family's Response to care:  Patient is in agreement with discharge plans   Patient/Family's Understanding of and Emotional Response to Diagnosis, Current Treatment, and Prognosis:  Family is very supportive. Patient and family are in agreement with SNF placement.   Emotional Assessment Appearance:  Appears stated age Attitude/Demeanor/Rapport:  Engaged Affect (typically observed):  Accepting, Appropriate Orientation:  Oriented to Self, Oriented to Place, Oriented to  Time, Oriented to Situation Alcohol / Substance use:  Not Applicable Psych involvement (Current and /or in the community):  No (Comment)  Discharge Needs  Concerns to be addressed:  Discharge Planning Concerns Readmission within the last 30 days:  No Current discharge risk:  None Barriers to Discharge:  Continued Medical Work up   [employer], [place] 01/27/2018, 2:42 PM

## 2018-01-27 NOTE — Progress Notes (Addendum)
[employer] -  at [facility]   PATIENT NAME: Shawna [name]    MR#:  [id number]  DATE OF BIRTH:  1936-05-05  SUBJECTIVE:  Patient seen and evaluated today Has some difficulty in speech Has left facial droop No chest pain   REVIEW OF SYSTEMS:    ROS  CONSTITUTIONAL: No documented fever. No fatigue, weakness. No weight gain, no weight loss.  EYES: No blurry or double vision.  ENT: No tinnitus. No postnasal drip. No redness of the oropharynx.  RESPIRATORY: No cough, no wheeze, no hemoptysis. No dyspnea.  CARDIOVASCULAR: No chest pain. No orthopnea. No palpitations. No syncope.  GASTROINTESTINAL: No nausea, no vomiting or diarrhea. No abdominal pain. No melena or hematochezia.  GENITOURINARY: No dysuria or hematuria.  ENDOCRINE: No polyuria or nocturia. No heat or cold intolerance.  HEMATOLOGY: No anemia. No bruising. No bleeding.  INTEGUMENTARY: No rashes. No lesions.  MUSCULOSKELETAL: No arthritis. No swelling. No gout.  NEUROLOGIC: No numbness, tingling, or ataxia. No seizure-type activity.  Left facial droop PSYCHIATRIC: No anxiety. No insomnia. No ADD.   DRUG ALLERGIES:  No Known Allergies  VITALS:  Blood pressure (!) 162/81, pulse 62, temperature 98.1 F (36.7 C), temperature source Oral, resp. rate 18, height 5\' 6"  (1.676 m), weight 99 kg (218 lb 3.2 oz), SpO2 96 %.  PHYSICAL EXAMINATION:   Physical Exam  GENERAL:  82 y.o.-year-old patient lying in the bed with no acute distress.  EYES: Pupils equal, round, reactive to light and accommodation. No scleral icterus. Extraocular muscles intact.  HEENT: Head atraumatic, normocephalic. Oropharynx and nasopharynx clear.  NECK:  Supple, no jugular venous distention. No thyroid enlargement, no tenderness.  LUNGS: Normal breath sounds bilaterally, no wheezing, rales, rhonchi. No use of accessory muscles of respiration.  CARDIOVASCULAR: S1, S2 normal. No murmurs, rubs, or gallops.  ABDOMEN: Soft,  nontender, nondistended. Bowel sounds present. No organomegaly or mass.  EXTREMITIES: No cyanosis, clubbing or edema b/l.    NEUROLOGIC: Has left facial droop Difficulty in speech Moves all extremities Power 5/5 by for all extremities Sensory system intact PSYCHIATRIC: The patient is alert and oriented x 3.  SKIN: No obvious rash, lesion, or ulcer.   LABORATORY PANEL:   CBC Recent Labs  Lab 01/26/18 1510  WBC 6.1  HGB 14.4  HCT 42.4  PLT 114*   ------------------------------------------------------------------------------------------------------------------ Chemistries  Recent Labs  Lab 01/26/18 1510  NA 141  K 3.4*  CL 101  CO2 27  GLUCOSE 134*  BUN 32*  CREATININE 1.28*  CALCIUM 9.6  AST 24  ALT 13*  ALKPHOS 84  BILITOT 0.9   ------------------------------------------------------------------------------------------------------------------  Cardiac Enzymes Recent Labs  Lab 01/26/18 1510  TROPONINI <0.03   ------------------------------------------------------------------------------------------------------------------  RADIOLOGY:  Ct Head Wo Contrast  Result Date: 01/26/2018 CLINICAL DATA:  Patient presents to the ED with obvious right sided facial droop and vision changes. Per family, patient's balance is "way off". Grip strength is equal and no drift. No obvious slurred speech at this time. Patient was seen well at 9:30 when husband left for the dentist. Husband returned at 12:30 and patient was having symptoms. Patient states it was approx. 5 min after husband left that symptoms began EXAM: CT HEAD WITHOUT CONTRAST TECHNIQUE: Contiguous axial images were obtained from the base of the skull through the vertex without intravenous contrast. COMPARISON:  06/12/2017 FINDINGS: Brain: No evidence of acute infarction, hemorrhage, hydrocephalus, extra-axial collection or mass lesion/mass effect. There is ventricular greater than sulcal enlargement reflecting a  predominance of  central volume loss, stable from prior CT. Mild encephalomalacia is noted along the anterior right temporal lobe, with another larger area of encephalomalacia along the posterior right frontal and adjacent parietal lobe, stable from the prior study. Vascular: No hyperdense vessel or unexpected calcification. Skull: Right frontoparietal craniectomy defect, unchanged from the prior study. No acute fracture or skull lesion. Sinuses/Orbits: Visualized globes and orbits are unremarkable. Visualized sinuses and mastoid air cells are clear. Other: None. IMPRESSION: 1. No acute intracranial abnormalities. 2. Stable postoperative changes on the right.  Stable atrophy. Electronically Signed   By: Amie Portland M.D.   On: 01/26/2018 15:29   Mr Laqueta Jean XB Contrast  Result Date: 01/26/2018 CLINICAL DATA:  82 y/o F; facial droop, blurred vision, altered mental status, instability. EXAM: MRI HEAD WITHOUT AND WITH CONTRAST TECHNIQUE: Multiplanar, multiecho pulse sequences of the brain and surrounding structures were obtained without and with intravenous contrast. CONTRAST:  20mL MULTIHANCE GADOBENATE DIMEGLUMINE 529 MG/ML IV SOLN COMPARISON:  01/26/2018 CT head.  06/13/2017 MRI head. FINDINGS: Brain: Foci of reduced diffusion within the left lateral thalamus and the left mid hippocampus compatible with acute/early subacute infarction. No associated hemorrhage or mass effect. Very small chronic infarcts are present within the right cerebellar hemisphere and the right hemi pons. There are small chronic lacunar infarcts within the right thalamus and the left caudate head. There is a chronic infarction within the right frontoparietal junction. Nonspecific foci of T2 FLAIR hyperintensity in white matter are compatible with mild chronic microvascular ischemic changes and there is moderate diffuse brain parenchymal volume loss. No new susceptibility hypointensity to indicate interval intracranial hemorrhage. No focal  mass effect, hydrocephalus, effacement of basilar cisterns, or herniation. After administration of intravenous contrast there is NO abnormal enhancement of the brain. Vascular: Normal flow voids. Skull and upper cervical spine: Large right convexity cranioplasty chronic postsurgical changes. Stable small right parietal bone hemangioma. Sinuses/Orbits: Negative. Other: None. IMPRESSION: 1. Foci of acute/early subacute infarction within the left lateral thalamus and the left mid hippocampus. No associated hemorrhage or mass effect. 2. Stable background of chronic infarctions, microvascular ischemic changes, and parenchymal volume loss of the brain. 3. Stable postsurgical changes related to a right convexity cranioplasty. These results will be called to the ordering clinician or representative by the Radiologist Assistant, and communication documented in the PACS or zVision Dashboard. Electronically Signed   By: Mitzi Hansen M.D.   On: 01/26/2018 23:58   US Carotid Bilateral (at [facility] Only)  Result Date: 01/27/2018 CLINICAL DATA:  Small acute left thalamus and hippocampus infarcts EXAM: BILATERAL CAROTID DUPLEX ULTRASOUND TECHNIQUE: Wallace Cullens scale imaging, color Doppler and duplex ultrasound were performed of bilateral carotid and vertebral arteries in the neck. COMPARISON:  01/26/2018, 06/13/2017 FINDINGS: Criteria: Quantification of carotid stenosis is based on velocity parameters that correlate the residual internal carotid diameter with NASCET-based stenosis levels, using the diameter of the distal internal carotid lumen as the denominator for stenosis measurement. The following velocity measurements were obtained: RIGHT ICA: 118/38 cm/sec CCA: 86/16 cm/sec SYSTOLIC ICA/CCA RATIO:  1.4 ECA:  90 cm/sec LEFT ICA: 123/35 cm/sec CCA: 109/24 cm/sec SYSTOLIC ICA/CCA RATIO:  1.1 ECA:  99 cm/sec RIGHT CAROTID ARTERY: Minor echogenic shadowing plaque formation. No hemodynamically significant right ICA  stenosis, velocity elevation, or turbulent flow. Degree of narrowing less than 50%. RIGHT VERTEBRAL ARTERY:  Antegrade LEFT CAROTID ARTERY: Similar scattered minor echogenic plaque formation. No hemodynamically significant left ICA stenosis, velocity elevation, or turbulent flow. LEFT VERTEBRAL ARTERY:  Antegrade IMPRESSION: Minor carotid atherosclerosis and tortuosity. No hemodynamically significant ICA stenosis. Degree of narrowing less than 50% bilaterally by ultrasound criteria. Patent antegrade vertebral flow bilaterally Electronically Signed   By: Judie PetitM.  Shick M.D.   On: 01/27/2018 09:30     ASSESSMENT AND PLAN:  82 year old female patient with history of subdural hematoma requiring craniotomy, CVA, chronic kidney disease stage III, hyperlipidemia, hypertension, osteoarthritis, seizures currently under hospitalist service for new onset stroke  -Acute thalamic and hippocampal CVA Aspirin 325 mg daily Discontinue Plavix Appreciate neurology follow-up Carotid ultrasound stenosis less than 50% MRI brain reviewed Follow-up echocardiogram report Physical therapy, speech therapy and occupational therapy evaluation  -Hypertension medical management  -Hyperlipidemia Continue Lipitor  -Chronic kidney disease stage III Monitor renal function  -DVT prophylaxis With subcu Lovenox daily   All the records are reviewed and case discussed with Care Management/Social Worker. Management plans discussed with the patient, family and they are in agreement.  CODE STATUS: Full code  DVT Prophylaxis: SCDs  TOTAL TIME TAKING CARE OF THIS PATIENT: 35 minutes.   POSSIBLE D/C IN 2 to 3 DAYS, DEPENDING ON CLINICAL CONDITION.  Ihor AustinPavan Pyreddy M.D on 01/27/2018 at 2:45 PM  Between 7am to 6pm - Pager - [phone]  After 6pm go to [email or web address] - password [email or web address] [facility]  [facility] [place] [facility]  Office  [phone]  CC: Primary care physician; Lauro RegulusAnderson, Marshall W, MD  Note: This dictation was  prepared with Dragon dictation along with smaller phrase technology. Any transcriptional errors that result from this process are unintentional.

## 2018-01-27 NOTE — Evaluation (Signed)
Physical Therapy Evaluation Patient Details Name: Shawna [name] MRN: [id number] DOB: 27-Oct-1935 Today's Date: 01/27/2018   History of Present Illness  Pt. is a 82 y.o. female who was admitted to [facility] with facial droop, and blurred vision. Imaging revealed acute/early subacute infarction within the Left lateral Thalamus. pt. PMHx includes: History of Subdural Hemtoma, with Temporal Frontoparietal Craniotomy., CKD, Depression, DJD, Edema, Hyperlipidemia, Urinary Incontinence, Hypothroidism, Paratyroid Abnormality, Seizures, TMJ  Clinical Impression  Upon evaluation, patient alert and oriented to self, location; follows simple, one-step commands, but demonstrates mild/mod deficits in processing, sequencing and STM.  Baseline L facial doop from previous CVA; unchanged with this admission per patient/family.  L UE with mild/mod ataxia, dysmetria (baseline); R UE with delay in speed and force of activation.  R LE with mod deficits in coordination, motor control.  Currently requiring min/mod assist for bed mobility, sit/stand, basic transfers and short-distance gait (5') with RW.  Assist for R UE/LE placement with all standing and gait efforts; poor control and inconsistent placement noted.  Unsafe to attempt without RW and +1 at all times; very high fall risk. Would benefit from skilled PT to address above deficits and promote optimal return to PLOF; recommend transition to STR upon discharge from acute hospitalization.      Follow Up Recommendations SNF    Equipment Recommendations       Recommendations for Other Services       Precautions / Restrictions Precautions Precautions: Fall Restrictions Weight Bearing Restrictions: No      Mobility  Bed Mobility Overal bed mobility: Needs Assistance Bed Mobility: Supine to Sit     Supine to sit: Min assist        Transfers Overall transfer level: Needs assistance Equipment used: Rolling walker (2 wheeled) Transfers: Sit to/from  Stand Sit to Stand: Min assist;Mod assist         General transfer comment: assist for R LE foot placement, lift off, anterior weight translation and standing balance  Ambulation/Gait Ambulation/Gait assistance: Min assist;Mod assist Ambulation Distance (Feet): 5 Feet(bed/chair, forward/backward) Assistive device: Rolling walker (2 wheeled)       General Gait Details: broad BOS with choppy steps; inconsistent R foot placement adn control.  Poor balance, high fall risk.  Limited ability to safely position and negotiate RW  Stairs            Wheelchair Mobility    Modified Rankin (Stroke Patients Only)       Balance Overall balance assessment: Needs assistance Sitting-balance support: No upper extremity supported;Feet supported Sitting balance-Leahy Scale: Good     Standing balance support: Bilateral upper extremity supported Standing balance-Leahy Scale: Poor                               Pertinent Vitals/Pain Pain Assessment: No/denies pain    Home Living Family/patient expects to be discharged to:: Private residence Living Arrangements: Spouse/significant other Available Help at Discharge: Family Type of Home: House Home Access: Stairs to enter   Secretary/administrator of Steps: 1 Home Layout: One level Home Equipment: Environmental consultant - 2 wheels;Cane - single point;Hand held shower head;Grab bars - tub/shower;Shower seat - built in;Grab bars - toilet;Adaptive equipment Additional Comments: Pt will have 24/7 supervision available from her husband but he is unable to provide level of physical assist the pt currently requires    Prior Function Level of Independence: Needs assistance         Comments: Ambulatory  without assist device for household distances ("furniture walks"), SPC for limited community distances     Hand Dominance   Dominant Hand: Right    Extremity/Trunk Assessment   Upper Extremity Assessment Upper Extremity Assessment:  (grossly 4-/5 throughout; L UE with mild/mod ataxia, dysmetria; R UE with decreased speed.  Denies sensory deficit)    Lower Extremity Assessment Lower Extremity Assessment: Generalized weakness(grossly 4-/5 throughout; mod deficits in R LE coordination and motor planning.  Denies sensory deficit)       Communication   Communication: (mild dysarthria, L facial droop)  Cognition Arousal/Alertness: Awake/alert Behavior During Therapy: WFL for tasks assessed/performed Overall Cognitive Status: Impaired/Different from baseline                                 General Comments: disoriented to date; noted deficits in STM; decreased safety awarenes, insight; delay in processing      General Comments      Exercises     Assessment/Plan    PT Assessment Patient needs continued PT services  PT Problem List Decreased strength;Decreased activity tolerance;Decreased balance;Decreased range of motion;Decreased coordination;Decreased knowledge of use of DME;Decreased knowledge of precautions;Decreased mobility;Decreased cognition;Decreased safety awareness       PT Treatment Interventions DME instruction;Stair training;Therapeutic activities;Balance training;Cognitive remediation;Gait training;Functional mobility training;Therapeutic exercise;Neuromuscular re-education;Patient/family education    PT Goals (Current goals can be found in the Care Plan section)  Acute Rehab PT Goals Patient Stated Goal: to get stronger PT Goal Formulation: With patient/family Time For Goal Achievement: 02/10/18 Potential to Achieve Goals: Good    Frequency 7X/week   Barriers to discharge Decreased caregiver support      Co-evaluation               AM-PAC PT "6 Clicks" Daily Activity  Outcome Measure Difficulty turning over in bed (including adjusting bedclothes, sheets and blankets)?: Unable Difficulty moving from lying on back to sitting on the side of the bed? : Unable Difficulty  sitting down on and standing up from a chair with arms (e.g., wheelchair, bedside commode, etc,.)?: Unable Help needed moving to and from a bed to chair (including a wheelchair)?: A Little Help needed walking in hospital room?: A Lot Help needed climbing 3-5 steps with a railing? : A Lot 6 Click Score: 10    End of Session Equipment Utilized During Treatment: Gait belt Activity Tolerance: Patient tolerated treatment well Patient left: in chair;with chair alarm set;with family/visitor present;with call bell/phone within reach Nurse Communication: Mobility status PT Visit Diagnosis: Muscle weakness (generalized) (M62.81);Difficulty in walking, not elsewhere classified (R26.2);Ataxic gait (R26.0);Unsteadiness on feet (R26.81)    Time: 1610-9604 PT Time Calculation (min) (ACUTE ONLY): 25 min   Charges:   PT Evaluation $PT Eval Moderate Complexity: 1 Mod PT Treatments $Therapeutic Activity: 8-22 mins   PT G Codes:        Mansel Strother H. Manson Passey, PT, DPT, NCS 01/27/18, 9:41 PM [phone]

## 2018-01-27 NOTE — Evaluation (Addendum)
Occupational Therapy Evaluation Patient Details Name: Shawna [name] MRN: [id number] DOB: [birth year] Today's Date: 01/27/2018    History of Present Illness Pt. is a 82 y.o. female who was admitted to [facility] with facial droop, and blurred vision. Imaging revealed acute/early subacute infarction within the Left lateral Thalamus. pt. PMHx includes: History of Subdural Hemtoma, with Temporal Frontoparietal Craniotomy., CKD, Depression, DJD, Edema, Hyperlipidemia, Urinary Incontinence, Hypothroidism, Paratyroid Abnormality, Seizures, TMJ   Clinical Impression   Pt. Presents with weakness, limited activity tolerance,limited cognition, and limited functional mobility which limits the ability to complete basic ADL and IADL functioning. Pt. Resides at home with her husband.   Pt. Required assist with ADL, and IADL tasks from her husband, and personal caregivers. Pt. Required assist with meal preparation, transportation, and performing home management, and household tasks. Pt. Reports independence with managing her medication. Pt. Could benefit from OT services for ADL training, A/E training, and pt. Education about home modification, and DME. Pt. would benefitt from SNF level of care upon discharge. Pt. Could benefit from follow-up OT services at discharge.    Follow Up Recommendations  SNF    Equipment Recommendations       Recommendations for Other Services       Precautions / Restrictions        Mobility Bed Mobility    MAxA x2 pt. Required cues for hand placement.              Transfers    deferred                  Balance                                           ADL either performed or assessed with clinical judgement   ADL Overall ADL's : Needs assistance/impaired Eating/Feeding: Set up;Minimal assistance   Grooming: Set up;Minimal assistance   Upper Body Bathing: Set up;Moderate assistance   Lower Body Bathing: Set up;Maximal assistance    Upper Body Dressing : Set up;Moderate assistance   Lower Body Dressing: Set up;Maximal assistance                       Vision   Vision Assessment?: Vision impaired- to be further tested in functional context     Perception     Praxis      Pertinent Vitals/Pain Pain Assessment: No/denies pain     Hand Dominance Right   Extremity/Trunk Assessment Upper Extremity Assessment Upper Extremity Assessment: Generalized weakness;LUE deficits/detail LUE Deficits / Details: Left shoulder 3-/5, elbow flexion/extension3+/5 LUE Sensation: decreased proprioception LUE Coordination: decreased gross motor;decreased fine motor           Communication     Cognition Arousal/Alertness: Awake/alert Behavior During Therapy: Anxious Overall Cognitive Status: Impaired/Different from baseline                                     General Comments       Exercises     Shoulder Instructions      Home Living Family/patient expects to be discharged to:: Private residence Living Arrangements: Spouse/significant other Available Help at Discharge: Family Type of Home: House Home Access: Level entry     Home Layout: One level     Bathroom Shower/Tub: Psychologist, counselling  Bathroom Accessibility: Yes   Home Equipment: Walker - 2 wheels;Cane - single point;Hand held shower head;Grab bars - tub/shower;Shower seat - built in;Grab bars - toilet;Adaptive equipment Adaptive Equipment: Reacher        Prior Functioning/Environment Level of Independence: Needs assistance    ADL's / Homemaking Assistance Needed: Pt. required assist for ADL/IADL tasks from her husband, and personal caregivers. Pt. reports that she was independent with medication management.             OT Problem List: Decreased strength;Impaired UE functional use;Decreased cognition;Decreased coordination;Decreased activity tolerance;Decreased range of motion      OT Treatment/Interventions:  Self-care/ADL training;Therapeutic exercise;Patient/family education;Therapeutic activities;DME and/or AE instruction    OT Goals(Current goals can be found in the care plan section) Acute Rehab OT Goals OT Goal Formulation: Patient unable to participate in goal setting  OT Frequency: Min 2X/week   Barriers to D/C:            Co-evaluation              AM-PAC PT "6 Clicks" Daily Activity     Outcome Measure Help from another person eating meals?: A Little Help from another person taking care of personal grooming?: A Little Help from another person toileting, which includes using toliet, bedpan, or urinal?: A Lot Help from another person bathing (including washing, rinsing, drying)?: A Lot Help from another person to put on and taking off regular upper body clothing?: A Lot Help from another person to put on and taking off regular lower body clothing?: A Lot 6 Click Score: 14   End of Session    Activity Tolerance: Patient tolerated treatment well Patient left: in bed  OT Visit Diagnosis: Muscle weakness (generalized) (M62.81)                Time: 1610-9604 OT Time Calculation (min): 25 min Charges:  OT General Charges $OT Visit: 1 Visit OT Evaluation $OT Eval Moderate Complexity: 1 Mod G-Codes:    Olegario Messier, MS, OTR/L Olegario Messier 01/27/2018, 12:16 PM

## 2018-01-27 NOTE — Progress Notes (Signed)
*  PRELIMINARY RESULTS* Echocardiogram 2D Echocardiogram has been performed.  Shawna [name] 01/27/2018, 9:49 AM

## 2018-01-28 LAB — LIPID PANEL
CHOLESTEROL: 149 mg/dL (ref 0–200)
HDL: 40 mg/dL — ABNORMAL LOW (ref >40)
LDL Cholesterol: 81 mg/dL (ref 0–99)
TRIGLYCERIDES: 139 mg/dL (ref <150)
Total CHOL/HDL Ratio: 3.7 RATIO
VLDL: 28 mg/dL (ref 0–40)

## 2018-01-28 LAB — BASIC METABOLIC PANEL
ANION GAP: 9 (ref 5–15)
BUN: 19 mg/dL (ref 6–20)
CHLORIDE: 103 mmol/L (ref 101–111)
CO2: 26 mmol/L (ref 22–32)
Calcium: 9.1 mg/dL (ref 8.9–10.3)
Creatinine, Ser: 1 mg/dL (ref 0.44–1.00)
GFR calc Af Amer: >60 mL/min (ref >60)
GFR, EST NON AFRICAN AMERICAN: 52 mL/min — AB (ref >60)
GLUCOSE: 120 mg/dL — AB (ref 65–99)
POTASSIUM: 3.3 mmol/L — AB (ref 3.5–5.1)
Sodium: 138 mmol/L (ref 135–145)

## 2018-01-28 LAB — HEPATITIS C ANTIBODY

## 2018-01-28 MED ORDER — POTASSIUM CHLORIDE CRYS ER 20 MEQ PO TBCR
40.0000 meq | EXTENDED_RELEASE_TABLET | ORAL | Status: AC
  Administered 2018-01-28 (×2): 40 meq via ORAL
  Filled 2018-01-28 (×2): qty 2

## 2018-01-28 NOTE — Clinical Social Work Note (Signed)
The CSW met with the patient and her spouse at bedside to discuss discharge planning. The patient and her spouse agreed on [facility]. The CSW advised that the patient would require prior authorization from [facility]. [facility] is aware and can accept the patient Monday pending authorization. CSW is following.  Santiago Bumpers, MSW, Latanya Presser [phone]

## 2018-01-28 NOTE — Progress Notes (Signed)
[employer] [facility] - [place] at [facility]   PATIENT NAME: Shawna [name]    MR#:  [id number]  DATE OF BIRTH:  07/08/1936  SUBJECTIVE:  CHIEF COMPLAINT:   Chief Complaint  Patient presents with  . Cerebrovascular Accident   -no Confusion noted this morning.  Has chronic right facial droop - PT recommended rehab  REVIEW OF SYSTEMS:  Review of Systems  Constitutional: Negative for chills, fever and malaise/fatigue.  HENT: Negative for congestion, ear discharge, hearing loss and nosebleeds.   Eyes: Negative for blurred vision and double vision.  Respiratory: Negative for cough, shortness of breath and wheezing.   Cardiovascular: Negative for chest pain and palpitations.  Gastrointestinal: Negative for abdominal pain, constipation, diarrhea, nausea and vomiting.  Genitourinary: Negative for dysuria.  Musculoskeletal: Negative for myalgias.  Neurological: Positive for sensory change and focal weakness. Negative for dizziness, seizures and headaches.  Psychiatric/Behavioral: Negative for depression, hallucinations and substance abuse.    DRUG ALLERGIES:  No Known Allergies  VITALS:  Blood pressure 129/76, pulse 74, temperature 98.1 F (36.7 C), temperature source Oral, resp. rate 18, height 5\' 6"  (1.676 m), weight 99 kg (218 lb 3.2 oz), SpO2 94 %.  PHYSICAL EXAMINATION:  Physical Exam  GENERAL:  82 y.o.-year-old patient lying in the bed with no acute distress.  EYES: Pupils equal, round, reactive to light and accommodation. No scleral icterus. Extraocular muscles intact.  HEENT: Head atraumatic, normocephalic. Oropharynx and nasopharynx clear.  NECK:  Supple, no jugular venous distention. No thyroid enlargement, no tenderness.  LUNGS: Normal breath sounds bilaterally, no wheezing, rales,rhonchi or crepitation. No use of accessory muscles of respiration.  CARDIOVASCULAR: S1, S2 normal. No rubs, or gallops. 2/6 systolic murmur present ABDOMEN: Soft, nontender,  nondistended. Bowel sounds present. No organomegaly or mass.  EXTREMITIES: No pedal edema, cyanosis, or clubbing.  NEUROLOGIC: right facial droop, otherwise Cranial nerves II through XII are intact. Muscle strength 5/5 in all extremities. Sensation intact-paresthesias of left upper extremity. Gait not checked.  PSYCHIATRIC: The patient is alert and oriented x 3.  SKIN: No obvious rash, lesion, or ulcer.    LABORATORY PANEL:   CBC Recent Labs  Lab 01/26/18 1510  WBC 6.1  HGB 14.4  HCT 42.4  PLT 114*   ------------------------------------------------------------------------------------------------------------------  Chemistries  Recent Labs  Lab 01/26/18 1510 01/28/18 0414  NA 141 138  K 3.4* 3.3*  CL 101 103  CO2 27 26  GLUCOSE 134* 120*  BUN 32* 19  CREATININE 1.28* 1.00  CALCIUM 9.6 9.1  AST 24  --   ALT 13*  --   ALKPHOS 84  --   BILITOT 0.9  --    ------------------------------------------------------------------------------------------------------------------  Cardiac Enzymes Recent Labs  Lab 01/26/18 1510  TROPONINI <0.03   ------------------------------------------------------------------------------------------------------------------  RADIOLOGY:  Ct Head Wo Contrast  Result Date: 01/26/2018 CLINICAL DATA:  Patient presents to the ED with obvious right sided facial droop and vision changes. Per family, patient's balance is "way off". Grip strength is equal and no drift. No obvious slurred speech at this time. Patient was seen well at 9:30 when husband left for the dentist. Husband returned at 12:30 and patient was having symptoms. Patient states it was approx. 5 min after husband left that symptoms began EXAM: CT HEAD WITHOUT CONTRAST TECHNIQUE: Contiguous axial images were obtained from the base of the skull through the vertex without intravenous contrast. COMPARISON:  06/12/2017 FINDINGS: Brain: No evidence of acute infarction, hemorrhage, hydrocephalus,  extra-axial collection or mass lesion/mass effect. There  is ventricular greater than sulcal enlargement reflecting a predominance of central volume loss, stable from prior CT. Mild encephalomalacia is noted along the anterior right temporal lobe, with another larger area of encephalomalacia along the posterior right frontal and adjacent parietal lobe, stable from the prior study. Vascular: No hyperdense vessel or unexpected calcification. Skull: Right frontoparietal craniectomy defect, unchanged from the prior study. No acute fracture or skull lesion. Sinuses/Orbits: Visualized globes and orbits are unremarkable. Visualized sinuses and mastoid air cells are clear. Other: None. IMPRESSION: 1. No acute intracranial abnormalities. 2. Stable postoperative changes on the right.  Stable atrophy. Electronically Signed   By: Amie Portland M.D.   On: 01/26/2018 15:29   Mr Laqueta Jean ON Contrast  Result Date: 01/26/2018 CLINICAL DATA:  82 y/o F; facial droop, blurred vision, altered mental status, instability. EXAM: MRI HEAD WITHOUT AND WITH CONTRAST TECHNIQUE: Multiplanar, multiecho pulse sequences of the brain and surrounding structures were obtained without and with intravenous contrast. CONTRAST:  20mL MULTIHANCE GADOBENATE DIMEGLUMINE 529 MG/ML IV SOLN COMPARISON:  01/26/2018 CT head.  06/13/2017 MRI head. FINDINGS: Brain: Foci of reduced diffusion within the left lateral thalamus and the left mid hippocampus compatible with acute/early subacute infarction. No associated hemorrhage or mass effect. Very small chronic infarcts are present within the right cerebellar hemisphere and the right hemi pons. There are small chronic lacunar infarcts within the right thalamus and the left caudate head. There is a chronic infarction within the right frontoparietal junction. Nonspecific foci of T2 FLAIR hyperintensity in white matter are compatible with mild chronic microvascular ischemic changes and there is moderate diffuse brain  parenchymal volume loss. No new susceptibility hypointensity to indicate interval intracranial hemorrhage. No focal mass effect, hydrocephalus, effacement of basilar cisterns, or herniation. After administration of intravenous contrast there is NO abnormal enhancement of the brain. Vascular: Normal flow voids. Skull and upper cervical spine: Large right convexity cranioplasty chronic postsurgical changes. Stable small right parietal bone hemangioma. Sinuses/Orbits: Negative. Other: None. IMPRESSION: 1. Foci of acute/early subacute infarction within the left lateral thalamus and the left mid hippocampus. No associated hemorrhage or mass effect. 2. Stable background of chronic infarctions, microvascular ischemic changes, and parenchymal volume loss of the brain. 3. Stable postsurgical changes related to a right convexity cranioplasty. These results will be called to the ordering clinician or representative by the Radiologist Assistant, and communication documented in the PACS or zVision Dashboard. Electronically Signed   By: Mitzi Hansen M.D.   On: 01/26/2018 23:58   US Carotid Bilateral (at [facility] Only)  Result Date: 01/27/2018 CLINICAL DATA:  Small acute left thalamus and hippocampus infarcts EXAM: BILATERAL CAROTID DUPLEX ULTRASOUND TECHNIQUE: Wallace Cullens scale imaging, color Doppler and duplex ultrasound were performed of bilateral carotid and vertebral arteries in the neck. COMPARISON:  01/26/2018, 06/13/2017 FINDINGS: Criteria: Quantification of carotid stenosis is based on velocity parameters that correlate the residual internal carotid diameter with NASCET-based stenosis levels, using the diameter of the distal internal carotid lumen as the denominator for stenosis measurement. The following velocity measurements were obtained: RIGHT ICA: 118/38 cm/sec CCA: 86/16 cm/sec SYSTOLIC ICA/CCA RATIO:  1.4 ECA:  90 cm/sec LEFT ICA: 123/35 cm/sec CCA: 109/24 cm/sec SYSTOLIC ICA/CCA RATIO:  1.1 ECA:  99  cm/sec RIGHT CAROTID ARTERY: Minor echogenic shadowing plaque formation. No hemodynamically significant right ICA stenosis, velocity elevation, or turbulent flow. Degree of narrowing less than 50%. RIGHT VERTEBRAL ARTERY:  Antegrade LEFT CAROTID ARTERY: Similar scattered minor echogenic plaque formation. No hemodynamically significant left ICA  stenosis, velocity elevation, or turbulent flow. LEFT VERTEBRAL ARTERY:  Antegrade IMPRESSION: Minor carotid atherosclerosis and tortuosity. No hemodynamically significant ICA stenosis. Degree of narrowing less than 50% bilaterally by ultrasound criteria. Patent antegrade vertebral flow bilaterally Electronically Signed   By: Judie Petit.  Shick M.D.   On: 01/27/2018 09:30    EKG:   Orders placed or performed during the hospital encounter of 01/26/18  . ED EKG  . ED EKG    ASSESSMENT AND PLAN:   82 year old female patient with history of subdural hematoma requiring craniotomy, CVA, chronic kidney disease stage III, hyperlipidemia, hypertension, osteoarthritis, seizures currently under hospitalist service for new onset stroke  1. Acute thalamic and hippocampal CVA- MRI of the brain confirming acute infarcts in left lateral thalamus and left mid hippocampus. -Appreciate neurology consult. Carotid ultrasound stenosis less than 50% -Echocardiogram with no embolic source noted. -Continue aspirin and statin. Physical therapy, speech therapy and occupational therapy -and will need rehab at discharge  2. Hypertension - continue medical management -On clonidine, lisinopril and hydrochlorothiazide  3. Hyperlipidemia Continue Lipitor  4. Chronic kidney disease stage III Monitor renal function  5. DVT prophylaxis With subcu Lovenox daily  6. Hypokalemia-being replaced     All the records are reviewed and case discussed with Care Management/Social Workerr. Management plans discussed with the patient, family and they are in agreement.  CODE STATUS: Full  code  TOTAL TIME TAKING CARE OF THIS PATIENT: 38 minutes.   POSSIBLE D/C IN 1-2 DAYS, DEPENDING ON CLINICAL CONDITION.   Enid Baas M.D on 01/28/2018 at 1:07 PM  Between 7am to 6pm - Pager - [phone]  After 6pm go to [email or web address] - Social research officer, government  [facility]  Office  [phone]  CC: Primary care physician; Lauro Regulus, MD

## 2018-01-28 NOTE — Progress Notes (Signed)
Physical Therapy Treatment Patient Details Name: Shawna [name] MRN: [id number] DOB: 04-04-1936 Today's Date: 01/28/2018    History of Present Illness Pt. is a 82 y.o. female who was admitted to [facility] with facial droop, and blurred vision. Imaging revealed acute/early subacute infarction within the Left lateral Thalamus. pt. PMHx includes: History of Subdural Hemtoma, with Temporal Frontoparietal Craniotomy., CKD, Depression, DJD, Edema, Hyperlipidemia, Urinary Incontinence, Hypothroidism, Paratyroid Abnormality, Seizures, TMJ    PT Comments    Pt in bed.  Reports she is feeling better.  Unable to recall yesterdays therapy session.  To edge of bed with rail and supervision/min guard for safety.  Once sitting EOB she remained upright without LOB.  Stood with walker with mod verbal cues for hand placements.  Once standing she was quite unsteady on her feet.  She relies on bed for support with post lean.  Hip and ankle strategies used excessively in attempt to maintain balance.  Marching in place x 5 with BLE and generally unsteady requiring min/mod a x 1 to prevent falls.  Return to sitting and second assist was called for safety.  She transferred to recliner at bedside with walker and min a x 2 with balance being primary barrier to further mobility.  Unsafe to advance gait this am.  She denies dizziness.  She was however able to remain standing for several minutes at recliner with +1 assist before fatigue.  Participated in exercises as described below.     Follow Up Recommendations  SNF     Equipment Recommendations       Recommendations for Other Services       Precautions / Restrictions Precautions Precautions: Fall Restrictions Weight Bearing Restrictions: No    Mobility  Bed Mobility Overal bed mobility: Needs Assistance Bed Mobility: Supine to Sit     Supine to sit: Min guard;Supervision        Transfers Overall transfer level: Needs assistance Equipment used: Rolling  walker (2 wheeled) Transfers: Sit to/from Stand Sit to Stand: Min assist         General transfer comment: while she is able to stand with verbal cues and little physcial support, balance remains significantly impaired despite walker use.  Ambulation/Gait Ambulation/Gait assistance: Min assist;+2 physical assistance;+2 safety/equipment Ambulation Distance (Feet): 3 Feet Assistive device: Rolling walker (2 wheeled) Gait Pattern/deviations: Step-to pattern;Decreased step length - right;Decreased step length - left;Leaning posteriorly Gait velocity: decreased   General Gait Details: choppy steps, poor balance - post and unsafe to transfer without walker with +2 assist, +1 assist for stand pivot without walker per nursing to commode at bedside.   Stairs             Wheelchair Mobility    Modified Rankin (Stroke Patients Only)       Balance Overall balance assessment: Needs assistance Sitting-balance support: No upper extremity supported;Feet supported Sitting balance-Leahy Scale: Good     Standing balance support: Bilateral upper extremity supported Standing balance-Leahy Scale: Poor                              Cognition Arousal/Alertness: Awake/alert Behavior During Therapy: WFL for tasks assessed/performed Overall Cognitive Status: Impaired/Different from baseline                                        Exercises Other Exercises Other Exercises: seated AROM  for LAQ, marches, ankle pumps x 10 in both unsupported sitting and in recliner.  marching in place x 10 with walker     General Comments        Pertinent Vitals/Pain Pain Assessment: No/denies pain    Home Living                      Prior Function            PT Goals (current goals can now be found in the care plan section) Progress towards PT goals: Progressing toward goals    Frequency    7X/week      PT Plan Current plan remains appropriate     Co-evaluation              AM-PAC PT "6 Clicks" Daily Activity  Outcome Measure  Difficulty turning over in bed (including adjusting bedclothes, sheets and blankets)?: None Difficulty moving from lying on back to sitting on the side of the bed? : None Difficulty sitting down on and standing up from a chair with arms (e.g., wheelchair, bedside commode, etc,.)?: Unable Help needed moving to and from a bed to chair (including a wheelchair)?: A Lot Help needed walking in hospital room?: A Lot Help needed climbing 3-5 steps with a railing? : A Lot 6 Click Score: 15    End of Session Equipment Utilized During Treatment: Gait belt Activity Tolerance: Patient tolerated treatment well Patient left: in chair;with chair alarm set;with call bell/phone within reach;with nursing/sitter in room Nurse Communication: Mobility status       Time: [id number] PT Time Calculation (min) (ACUTE ONLY): 16 min  Charges:  $Therapeutic Activity: 8-22 mins                    G Codes:       Danielle DessSarah Brie Eppard, PTA 01/28/18, 9:19 AM

## 2018-01-28 NOTE — Plan of Care (Signed)
Pt has some periods of confusion during shift. Pt report no pain but forgot where she was few times and had to be re-oriented. No other signs of distress noted. Will continue to monitor.

## 2018-01-28 NOTE — Evaluation (Signed)
Speech Language Pathology Evaluation Patient Details Name: Shawna [name] MRN: [id number] DOB: 02/02/1936 Today's Date: 01/28/2018 Time: 1191-47820940-1025 SLP Time Calculation (min) (ACUTE ONLY): 45 min  Problem List:  Patient Active Problem List   Diagnosis Date Noted  . Stroke (HCC) 01/26/2018  . Diastolic congestive heart failure (HCC) 07/28/2017  . Troponin I above reference range 06/15/2017  . Acute CVA (cerebrovascular accident) (HCC) 06/15/2017  . Otalgia of both ears 06/08/2017  . Right leg pain 06/08/2017  . Dizzy 06/08/2017   Past Medical History:  Past Medical History:  Diagnosis Date  . Cerebral infarction (HCC)   . CKD (chronic kidney disease), stage III (HCC)   . Depression   . DJD (degenerative joint disease)   . Edema   . High cholesterol   . Hyperlipidemia, unspecified   . Hypertension   . Hypothyroidism   . Malaise   . Morbid obesity (HCC)   . Osteoarthritis   . Parathyroid abnormality (HCC)   . Seizures (HCC)    a. following remote stroke.  . Stroke [facility](HCC)    a. 1964-->residual right arm wkns.  Previously on coumadin - pt says for just a few yrs.  . Subdural hematoma (HCC)    a. 10/2001 [facility] req temporal frontoparietal craniotomy following fall. ? whether or not pt on coumadin @ time.  Notes indicate yes but pt denies.  . Thyroid disease   . TMJ (dislocation of temporomandibular joint)   . Tuberculosis    a. ~ 1950  . Urinary incontinence   . Weight loss    Past Surgical History:  Past Surgical History:  Procedure Laterality Date  . ABDOMINAL HYSTERECTOMY    . BACK SURGERY    . BRAIN SURGERY    . cataract surgery     2014  . LEFT HEART CATH AND CORONARY ANGIOGRAPHY N/A 06/13/2017   Procedure: LEFT HEART CATH AND CORONARY ANGIOGRAPHY;  Surgeon: Iran OuchArida, Muhammad A, MD;  Location: [facility] INVASIVE CV LAB;  Service: Cardiovascular;  Laterality: N/A;  . PARATHYROID ADENOMA REMOVAL     HPI:      Assessment / Plan / Recommendation Clinical Impression  pt  presents with a moderate cognitive linguistic deficit as characterized by poor naming skills, poor visual motor skills, poor recall of new and previously presented information and orientation. pt was not able to state the day of the week, month of the year, the year, or her birthdate during the evaluation. pt was given the [facility] to assess her overall cognitive linguistic function and recieved a score of 7/30 indicating a moderate to severe cognitive linguistic deficit.   the pt is aware of her deficits some as noted when she stated she has a "worse than before memory" however level of deficit does not appear to be evident to the pt.  Cognitive interventions would be useful in assisting pt to return to baseline.     SLP Assessment  SLP Recommendation/Assessment: Patient needs continued Speech Lanaguage Pathology Services SLP Visit Diagnosis: Cognitive communication deficit (R41.841)    Follow Up Recommendations       Frequency and Duration min 3x week  2 weeks      SLP Evaluation Cognition  Overall Cognitive Status: Impaired/Different from baseline Arousal/Alertness: Awake/alert Orientation Level: Oriented to place;Oriented to person Attention: Selective Selective Attention: Impaired Selective Attention Impairment: Verbal basic;Functional basic Memory: Impaired Memory Impairment: Storage deficit;Decreased long term memory;Retrieval deficit;Decreased short term memory;Decreased recall of new information Problem Solving: Impaired Problem Solving Impairment: Verbal basic Executive Function: Reasoning;Sequencing;Self  Correcting Reasoning: Impaired Sequencing: Impaired Self Correcting: Impaired Safety/Judgment: Appears intact Comments: pt was administered the MOCA to assess current cognitive function. pt recieved a score of 7/30 indicating a severe cognitive function defict.        Comprehension  Auditory Comprehension Overall Auditory Comprehension: Impaired Yes/No Questions: Within  Functional Limits Commands: Within Functional Limits Conversation: Simple EffectiveTechniques: Extra processing time;Pausing;Repetition Visual Recognition/Discrimination Discrimination: Exceptions to [facility] Common Objects: Unable to indentify Pictures: Unable to indentify [employer] Drawings: Unable to identify    Expression Expression Primary Mode of Expression: Verbal Verbal Expression Overall Verbal Expression: Impaired Initiation: No impairment Automatic Speech: Name;Social Response;Counting Level of Generative/Spontaneous Verbalization: Phrase Repetition: Impaired Level of Impairment: Sentence level Naming: Impairment Responsive: 51-75% accurate Common Objects: Unable to indentify Pictures: Unable to indentify [employer] Drawings: Unable to identify Verbal Errors: Aware of errors Pragmatics: No impairment Interfering Components: Attention Effective Techniques: Phonemic cues;Open ended questions;Semantic cues Non-Verbal Means of Communication: Not applicable Written Expression Dominant Hand: Right   Oral / Motor  Oral Motor/Sensory Function Overall Oral Motor/Sensory Function: Within functional limits Motor Speech Overall Motor Speech: Appears within functional limits for tasks assessed   GO                    Meredith [facility] 01/28/2018, 10:51 AM

## 2018-01-29 ENCOUNTER — Inpatient Hospital Stay: Payer: Medicare Other

## 2018-01-29 LAB — URINALYSIS, COMPLETE (UACMP) WITH MICROSCOPIC
BACTERIA UA: NONE SEEN
Bilirubin Urine: NEGATIVE
GLUCOSE, UA: NEGATIVE mg/dL
Hgb urine dipstick: NEGATIVE
KETONES UR: NEGATIVE mg/dL
Leukocytes, UA: NEGATIVE
Nitrite: NEGATIVE
PH: 5 (ref 5.0–8.0)
Protein, ur: NEGATIVE mg/dL
Specific Gravity, Urine: 1.019 (ref 1.005–1.030)

## 2018-01-29 LAB — BASIC METABOLIC PANEL
ANION GAP: 7 (ref 5–15)
BUN: 21 mg/dL — ABNORMAL HIGH (ref 6–20)
CHLORIDE: 107 mmol/L (ref 101–111)
CO2: 27 mmol/L (ref 22–32)
Calcium: 9.1 mg/dL (ref 8.9–10.3)
Creatinine, Ser: 1.14 mg/dL — ABNORMAL HIGH (ref 0.44–1.00)
GFR calc Af Amer: 52 mL/min — ABNORMAL LOW (ref >60)
GFR, EST NON AFRICAN AMERICAN: 45 mL/min — AB (ref >60)
Glucose, Bld: 120 mg/dL — ABNORMAL HIGH (ref 65–99)
POTASSIUM: 3.9 mmol/L (ref 3.5–5.1)
SODIUM: 141 mmol/L (ref 135–145)

## 2018-01-29 LAB — GLUCOSE, CAPILLARY: GLUCOSE-CAPILLARY: 107 mg/dL — AB (ref 65–99)

## 2018-01-29 MED ORDER — MIRTAZAPINE 15 MG PO TABS
30.0000 mg | ORAL_TABLET | Freq: Every day | ORAL | Status: DC
  Administered 2018-01-29 – 2018-01-31 (×3): 30 mg via ORAL
  Filled 2018-01-29 (×3): qty 2

## 2018-01-29 NOTE — Plan of Care (Signed)
Pt is still having blurry vision and seems more lethargic than usual. Pt BS was checked and it was 107 and vitals were WDL. No signs of distress noted. Will continue to monitor.

## 2018-01-29 NOTE — Progress Notes (Signed)
Notified by PT that pt difficult to arouse, upon arrival pt able to be aroused by loud voice, pt hard of hearing, pt able to follow commands, assisted OOB to chair but remains disoriented to place time and situation today which is a change from yesterday, family concerned that pt slightly confused today and a little spaced out, Dr Nemiah CommanderKalisetti made aware of all of the above, Dr Nemiah CommanderKalisetti to adjust orders
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
## 2018-01-29 NOTE — Care Management Important Message (Signed)
Important Message  Patient Details  Name: Ernst BreachJanet A Hawker MRN: [id number] Date of Birth: 12/30/1935   Medicare Important Message Given:  Yes    Brennan Karam A, RN 01/29/2018, 3:20 PM

## 2018-01-29 NOTE — Progress Notes (Signed)
[employer] [facility] [facility] [place] [facility]   PATIENT NAME: [name]    MR#:  [id number]  DATE OF BIRTH:  09/02/1935  SUBJECTIVE:  CHIEF COMPLAINT:   Chief Complaint  Patient presents with  . Cerebrovascular Accident   -Is better.  No complaints today. -Urinary retention last night requiring and in and out catheterization  REVIEW OF SYSTEMS:  Review of Systems  Constitutional: Negative for chills, fever and malaise/fatigue.  HENT: Negative for congestion, ear discharge, hearing loss and nosebleeds.   Eyes: Negative for blurred vision and double vision.  Respiratory: Negative for cough, shortness of breath and wheezing.   Cardiovascular: Negative for chest pain and palpitations.  Gastrointestinal: Negative for abdominal pain, constipation, diarrhea, nausea and vomiting.  Genitourinary: Negative for dysuria.  Musculoskeletal: Negative for myalgias.  Neurological: Positive for sensory change and focal weakness. Negative for dizziness, seizures and headaches.  Psychiatric/Behavioral: Negative for depression, hallucinations and substance abuse.    DRUG ALLERGIES:  No Known Allergies  VITALS:  Blood pressure (!) 141/68, pulse 65, temperature 98.6 F (37 C), temperature source Oral, resp. rate 18, height 5\' 6"  (1.676 m), weight 99 kg (218 lb 3.2 oz), SpO2 95 %.  PHYSICAL EXAMINATION:  Physical Exam  GENERAL:  82 y.o.-year-old patient lying in the bed with no acute distress.  EYES: Pupils equal, round, reactive to light and accommodation. No scleral icterus. Extraocular muscles intact.  HEENT: Head atraumatic, normocephalic. Oropharynx and nasopharynx clear.  NECK:  Supple, no jugular venous distention. No thyroid enlargement, no tenderness.  LUNGS: Normal breath sounds bilaterally, no wheezing, rales,rhonchi or crepitation. No use of accessory muscles of respiration.  CARDIOVASCULAR: S1, S2 normal. No rubs, or gallops. 2/6 systolic murmur present ABDOMEN:  Soft, nontender, nondistended. Bowel sounds present. No organomegaly or mass.  EXTREMITIES: No pedal edema, cyanosis, or clubbing.  NEUROLOGIC: left facial droop, left ptosis, otherwise Cranial nerves II through XII are intact. Muscle strength 5/5 in all extremities. Sensation intact-paresthesias of left upper extremity. Gait not checked.  PSYCHIATRIC: The patient is alert and oriented x 3.  SKIN: No obvious rash, lesion, or ulcer.    LABORATORY PANEL:   CBC Recent Labs  Lab 01/26/18 1510  WBC 6.1  HGB 14.4  HCT 42.4  PLT 114*   ------------------------------------------------------------------------------------------------------------------  Chemistries  Recent Labs  Lab 01/26/18 1510  01/29/18 0436  NA 141   < > 141  K 3.4*   < > 3.9  CL 101   < > 107  CO2 27   < > 27  GLUCOSE 134*   < > 120*  BUN 32*   < > 21*  CREATININE 1.28*   < > 1.14*  CALCIUM 9.6   < > 9.1  AST 24  --   --   ALT 13*  --   --   ALKPHOS 84  --   --   BILITOT 0.9  --   --    < > = values in this interval not displayed.   ------------------------------------------------------------------------------------------------------------------  Cardiac Enzymes Recent Labs  Lab 01/26/18 1510  TROPONINI <0.03   ------------------------------------------------------------------------------------------------------------------  RADIOLOGY:  No results found.  EKG:   Orders placed or performed during the hospital encounter of 01/26/18  . ED EKG  . ED EKG    ASSESSMENT AND PLAN:   82 year old female patient with history of subdural hematoma requiring craniotomy, CVA, chronic kidney disease stage III, hyperlipidemia, hypertension, osteoarthritis, seizures currently under hospitalist service for new onset stroke  1.  Acute thalamic and hippocampal CVA- MRI of the brain confirming acute infarcts in left lateral thalamus and left mid hippocampus. -Appreciate neurology consult. Carotid ultrasound  stenosis less than 50% -Echocardiogram with no embolic source noted. -Continue aspirin and statin. Physical therapy, speech therapy and occupational therapy -and will need rehab at discharge  2. Hypertension - continue medical management -On clonidine, lisinopril and hydrochlorothiazide  3. Hyperlipidemia Continue Lipitor  4. Chronic kidney disease stage III Monitor renal function  5. DVT prophylaxis With subcu Lovenox daily  6. Hypokalemia- replaced  To [facility] tomorrow   All the records are reviewed and case discussed with Care Management/Social Workerr. Management plans discussed with the patient, family and they are in agreement.  CODE STATUS: Full code  TOTAL TIME TAKING CARE OF THIS PATIENT: 38 minutes.   POSSIBLE D/C IN 1-2 DAYS, DEPENDING ON CLINICAL CONDITION.   Enid Baas M.D on 01/29/2018 at 10:38 AM  Between 7am to 6pm - Pager - [phone]  After 6pm go to [email or web address] - Social research officer, government  [facility]  Office  [phone]  CC: Primary care physician; Lauro Regulus, MD

## 2018-01-29 NOTE — Progress Notes (Signed)
Pt bladder scan showed in bladder. MD called and in and out cath ordered and administered. Pending urine sample collected and a total of obtained. No other signs of distress noted. Will continue to monitor.

## 2018-01-29 NOTE — Progress Notes (Signed)
[employer] - [place] at [facility]   PATIENT NAME: Shawna [name]    MR#:  [id number]  DATE OF BIRTH:  02/22/1936  SUBJECTIVE:  CHIEF COMPLAINT:   Chief Complaint  Patient presents with  . Cerebrovascular Accident   -Is better.  No complaints today. -Urinary retention last night requiring and in and out catheterization  REVIEW OF SYSTEMS:  Review of Systems  Constitutional: Negative for chills, fever and malaise/fatigue.  HENT: Negative for congestion, ear discharge, hearing loss and nosebleeds.   Eyes: Negative for blurred vision and double vision.  Respiratory: Negative for cough, shortness of breath and wheezing.   Cardiovascular: Negative for chest pain and palpitations.  Gastrointestinal: Negative for abdominal pain, constipation, diarrhea, nausea and vomiting.  Genitourinary: Negative for dysuria.  Musculoskeletal: Negative for myalgias.  Neurological: Positive for sensory change and focal weakness. Negative for dizziness, seizures and headaches.  Psychiatric/Behavioral: Negative for depression, hallucinations and substance abuse.    DRUG ALLERGIES:  No Known Allergies  VITALS:  Blood pressure (!) 141/68, pulse 65, temperature 98.6 F (37 C), temperature source Oral, resp. rate 18, height 5\' 6"  (1.676 m), weight 99 kg (218 lb 3.2 oz), SpO2 95 %.  PHYSICAL EXAMINATION:  Physical Exam  GENERAL:  82 y.o.-year-old patient lying in the bed with no acute distress.  EYES: Pupils equal, round, reactive to light and accommodation. No scleral icterus. Extraocular muscles intact.  HEENT: Head atraumatic, normocephalic. Oropharynx and nasopharynx clear.  NECK:  Supple, no jugular venous distention. No thyroid enlargement, no tenderness.  LUNGS: Normal breath sounds bilaterally, no wheezing, rales,rhonchi or crepitation. No use of accessory muscles of respiration.  CARDIOVASCULAR: S1, S2 normal. No rubs, or gallops. 2/6 systolic murmur present ABDOMEN:  Soft, nontender, nondistended. Bowel sounds present. No organomegaly or mass.  EXTREMITIES: No pedal edema, cyanosis, or clubbing.  NEUROLOGIC: left facial droop, left ptosis, otherwise Cranial nerves II through XII are intact. Muscle strength 5/5 in all extremities. Sensation intact-paresthesias of left upper extremity. Gait not checked.  PSYCHIATRIC: The patient is alert and oriented x 3.  SKIN: No obvious rash, lesion, or ulcer.    LABORATORY PANEL:   CBC Recent Labs  Lab 01/26/18 1510  WBC 6.1  HGB 14.4  HCT 42.4  PLT 114*   ------------------------------------------------------------------------------------------------------------------  Chemistries  Recent Labs  Lab 01/26/18 1510  01/29/18 0436  NA 141   < > 141  K 3.4*   < > 3.9  CL 101   < > 107  CO2 27   < > 27  GLUCOSE 134*   < > 120*  BUN 32*   < > 21*  CREATININE 1.28*   < > 1.14*  CALCIUM 9.6   < > 9.1  AST 24  --   --   ALT 13*  --   --   ALKPHOS 84  --   --   BILITOT 0.9  --   --    < > = values in this interval not displayed.   ------------------------------------------------------------------------------------------------------------------  Cardiac Enzymes Recent Labs  Lab 01/26/18 1510  TROPONINI <0.03   ------------------------------------------------------------------------------------------------------------------  RADIOLOGY:  No results found.  EKG:   Orders placed or performed during the hospital encounter of 01/26/18  . ED EKG  . ED EKG    ASSESSMENT AND PLAN:   82 year old female patient with history of subdural hematoma requiring craniotomy, CVA, chronic kidney disease stage III, hyperlipidemia, hypertension, osteoarthritis, seizures currently under hospitalist service for new onset stroke  1.  Acute thalamic and hippocampal CVA- MRI of the brain confirming acute infarcts in left lateral thalamus and left mid hippocampus. -Appreciate neurology consult. Carotid ultrasound  stenosis less than 50% -Echocardiogram with no embolic source noted. -Continue aspirin and statin. Physical therapy, speech therapy and occupational therapy -and will need rehab at discharge  2. Hypertension - continue medical management -On clonidine, lisinopril and hydrochlorothiazide  3. Hyperlipidemia Continue Lipitor  4. Chronic kidney disease stage III Monitor renal function  5. DVT prophylaxis With subcu Lovenox daily  6. Hypokalemia- replaced     All the records are reviewed and case discussed with Care Management/Social Workerr. Management plans discussed with the patient, family and they are in agreement.  CODE STATUS: Full code  TOTAL TIME TAKING CARE OF THIS PATIENT: 38 minutes.   POSSIBLE D/C IN 1-2 DAYS, DEPENDING ON CLINICAL CONDITION.   Enid Baas M.D on 01/29/2018 at 10:35 AM  Between 7am to 6pm - Pager - [phone]  After 6pm go to [email or web address] - Social research officer, government  [facility]  Office  [phone]  CC: Primary care physician; Lauro Regulus, MD

## 2018-01-30 ENCOUNTER — Inpatient Hospital Stay: Payer: Medicare Other

## 2018-01-30 ENCOUNTER — Encounter: Payer: Self-pay | Admitting: Radiology

## 2018-01-30 MED ORDER — CLOPIDOGREL BISULFATE 75 MG PO TABS
75.0000 mg | ORAL_TABLET | Freq: Every day | ORAL | Status: DC
  Administered 2018-01-30 – 2018-02-01 (×3): 75 mg via ORAL
  Filled 2018-01-30 (×3): qty 1

## 2018-01-30 MED ORDER — IOPAMIDOL (ISOVUE-370) INJECTION 76%
75.0000 mL | Freq: Once | INTRAVENOUS | Status: AC | PRN
  Administered 2018-01-30: 14:00:00 75 mL via INTRAVENOUS

## 2018-01-30 NOTE — Progress Notes (Signed)
OT Cancellation Note  Patient Details Name: Shawna [name] MRN: [id number] DOB: 09/09/1935   Cancelled Treatment:    Reason Eval/Treat Not Completed: Patient at procedure or test/ unavailable. Upon attempt, pt unavailable for OT tx - CT angio head/neck. Will re-attempt at later date/time as pt is available.  Richrd PrimeJamie Stiller, MPH, MS, OTR/L ascom [phone] 01/30/18, 2:33 PM

## 2018-01-30 NOTE — Progress Notes (Signed)
[employer] - [place] at [facility]   PATIENT NAME: Shawna [name]    MR#:  [id number]  DATE OF BIRTH:  04/11/1936  SUBJECTIVE:  CHIEF COMPLAINT:   Chief Complaint  Patient presents with  . Cerebrovascular Accident   - more alert today, a little emotional this AM -Blurred vision worsening today, unable to watch television.  REVIEW OF SYSTEMS:  Review of Systems  Constitutional: Negative for chills, fever and malaise/fatigue.  HENT: Negative for congestion, ear discharge, hearing loss and nosebleeds.   Eyes: Positive for blurred vision. Negative for double vision.  Respiratory: Negative for cough, shortness of breath and wheezing.   Cardiovascular: Negative for chest pain and palpitations.  Gastrointestinal: Negative for abdominal pain, constipation, diarrhea, nausea and vomiting.  Genitourinary: Negative for dysuria.  Musculoskeletal: Negative for myalgias.  Neurological: Positive for sensory change and focal weakness. Negative for dizziness, seizures and headaches.  Psychiatric/Behavioral: Negative for depression, hallucinations and substance abuse.    DRUG ALLERGIES:  No Known Allergies  VITALS:  Blood pressure 120/66, pulse 72, temperature 98.6 F (37 C), temperature source Oral, resp. rate 18, height 5\' 6"  (1.676 m), weight 99 kg (218 lb 3.2 oz), SpO2 94 %.  PHYSICAL EXAMINATION:  Physical Exam  GENERAL:  82 y.o.-year-old patient lying in the bed with no acute distress.  EYES: Pupils equal, round, reactive to light and accommodation. No scleral icterus. Extraocular muscles intact.  HEENT: Head atraumatic, normocephalic. Oropharynx and nasopharynx clear.  NECK:  Supple, no jugular venous distention. No thyroid enlargement, no tenderness.  LUNGS: Normal breath sounds bilaterally, no wheezing, rales,rhonchi or crepitation. No use of accessory muscles of respiration.  CARDIOVASCULAR: S1, S2 normal. No rubs, or gallops. 2/6 systolic murmur  present ABDOMEN: Soft, nontender, nondistended. Bowel sounds present. No organomegaly or mass.  EXTREMITIES: No pedal edema, cyanosis, or clubbing.  NEUROLOGIC: left facial droop, left ptosis, otherwise Cranial nerves II through XII are intact.  Significantly decreased visual acuity in both eyes today especially central vision.  Muscle strength 5/5 in all extremities. Sensation intact-paresthesias of left upper extremity. Gait not checked.  PSYCHIATRIC: The patient is alert and oriented x 3.  SKIN: No obvious rash, lesion, or ulcer.    LABORATORY PANEL:   CBC Recent Labs  Lab 01/26/18 1510  WBC 6.1  HGB 14.4  HCT 42.4  PLT 114*   ------------------------------------------------------------------------------------------------------------------  Chemistries  Recent Labs  Lab 01/26/18 1510  01/29/18 0436  NA 141   < > 141  K 3.4*   < > 3.9  CL 101   < > 107  CO2 27   < > 27  GLUCOSE 134*   < > 120*  BUN 32*   < > 21*  CREATININE 1.28*   < > 1.14*  CALCIUM 9.6   < > 9.1  AST 24  --   --   ALT 13*  --   --   ALKPHOS 84  --   --   BILITOT 0.9  --   --    < > = values in this interval not displayed.   ------------------------------------------------------------------------------------------------------------------  Cardiac Enzymes Recent Labs  Lab 01/26/18 1510  TROPONINI <0.03   ------------------------------------------------------------------------------------------------------------------  RADIOLOGY:  Ct Head Wo Contrast  Result Date: 01/29/2018 CLINICAL DATA:  Worsening confusion today. Facial droop and blurred vision. Left thalamic and temporal lobe infarctions. EXAM: CT HEAD WITHOUT CONTRAST TECHNIQUE: Contiguous axial images were obtained from the base of the skull through the vertex without intravenous contrast. COMPARISON:  MRI 01/26/2018 FINDINGS: Brain: No discernible change. Chronic small-vessel ischemic changes affect the pons. Cerebellar atrophy. Old right  frontoparietal craniectomy with atrophy and encephalomalacia of the right temporal lobe and frontoparietal brain. Low-density in the left thalamus and mesial temporal lobe on the left consistent with the recent infarction. No evidence of extension. No hemorrhage or swelling. No midline shift. No obstructive hydrocephalus. No extra-axial collection. Vascular: There is atherosclerotic calcification of the major vessels at the base of the brain. Skull: Otherwise negative. Sinuses/Orbits: Clear/normal. Other: None IMPRESSION: No acute finding by CT. Low-density in the left thalamus and mesial temporal lobe consistent with acute infarction shown in that area by CT. No evidence of extension or hemorrhage. Previous right craniotomy and cranioplasty with atrophy and encephalomalacia of the right temporal lobe and right frontoparietal brain. Electronically Signed   By: Paulina Fusi M.D.   On: 01/29/2018 14:50    EKG:   Orders placed or performed during the hospital encounter of 01/26/18  . ED EKG  . ED EKG    ASSESSMENT AND PLAN:   82 year old female patient with history of subdural hematoma requiring craniotomy, CVA, chronic kidney disease stage III, hyperlipidemia, hypertension, osteoarthritis, seizures currently under hospitalist service for new onset stroke  1. Acute thalamic and hippocampal CVA- MRI of the brain confirming acute infarcts in left lateral thalamus and left mid hippocampus. -Appreciate neurology consult. Carotid ultrasound stenosis less than 50% -Echocardiogram with no embolic source noted. -Continue aspirin and statin. Physical therapy, speech therapy and occupational therapy -and will need rehab at discharge  2. Hypertension - continue medical management -On clonidine, lisinopril and hydrochlorothiazide  3. Hyperlipidemia Continue Lipitor  4. Chronic kidney disease stage III Monitor renal function  5.  Altered mental status-was more sleepy yesterday.  Remeron at bedtime  decreased last night.  More alert and at baseline this morning.  Slightly emotional according to RN. -However, complains of decreased vision especially central vision.  Will repeat MRI brain -CT head done yesterday not showing any acute findings  6. DVT prophylaxis With subcu Lovenox daily   To [facility] whenever medically ready   All the records are reviewed and case discussed with Care Management/Social Workerr. Management plans discussed with the patient, family and they are in agreement.  CODE STATUS: Full code  TOTAL TIME TAKING CARE OF THIS PATIENT: 39 minutes.   POSSIBLE D/C IN 1-2 DAYS, DEPENDING ON CLINICAL CONDITION.   Enid Baas M.D on 01/30/2018 at 10:58 AM  Between 7am to 6pm - Pager - [phone]  After 6pm go to [email or web address] - Social research officer, government  [facility]  Office  [phone]  CC: Primary care physician; Lauro Regulus, MD

## 2018-01-30 NOTE — Progress Notes (Signed)
Subjective: Notified by Dr. Nemiah Commander today that patient with new visual complaints MRI of the brain performed in follow up of these complaints.  Objective: Current vital signs: BP 140/68 (BP Location: Left Arm)   Pulse 78   Temp 98.6 F (37 C) (Oral)   Resp 16   Ht 5\' 6"  (1.676 m)   Wt 99 kg (218 lb 3.2 oz)   SpO2 94%   BMI 35.22 kg/m  Vital signs in last 24 hours: Temp:  [98.6 F (37 C)-99.1 F (37.3 C)] 98.6 F (37 C) (06/03 1247) Pulse Rate:  [63-78] 78 (06/03 1247) Resp:  [16-18] 16 (06/03 1247) BP: (119-140)/(62-68) 140/68 (06/03 1247) SpO2:  [94 %] 94 % (06/03 0800)  Intake/Output from previous day: 06/02 0701 - 06/03 0700 In: 240 [P.O.:240] Out: 500 [Urine:500] Intake/Output this shift: Total I/O In: 240 [P.O.:240] Out: -  Nutritional status:  Diet Order           Diet heart healthy/carb modified Room service appropriate? Yes; Fluid consistency: Thin  Diet effective now          Neurologic Exam: Patient unable to be evaluated due to being away at testing.  Family spoken to at length.    Lab Results: Basic Metabolic Panel: Recent Labs  Lab 01/26/18 1510 01/28/18 0414 01/29/18 0436  NA 141 138 141  K 3.4* 3.3* 3.9  CL 101 103 107  CO2 27 26 27   GLUCOSE 134* 120* 120*  BUN 32* 19 21*  CREATININE 1.28* 1.00 1.14*  CALCIUM 9.6 9.1 9.1    Liver Function Tests: Recent Labs  Lab 01/26/18 1510  AST 24  ALT 13*  ALKPHOS 84  BILITOT 0.9  PROT 8.2*  ALBUMIN 4.8   No results for input(s): LIPASE, AMYLASE in the last 168 hours. No results for input(s): AMMONIA in the last 168 hours.  CBC: Recent Labs  Lab 01/26/18 1510  WBC 6.1  NEUTROABS 4.4  HGB 14.4  HCT 42.4  MCV 95.1  PLT 114*    Cardiac Enzymes: Recent Labs  Lab 01/26/18 1510  TROPONINI <0.03    Lipid Panel: Recent Labs  Lab 01/28/18 0414  CHOL 149  TRIG 139  HDL 40*  CHOLHDL 3.7  VLDL 28  LDLCALC 81    CBG: Recent Labs  Lab 01/26/18 1455 01/27/18 2145  01/29/18 0040  GLUCAP 134* 105* 107*    Microbiology: No results found for this or any previous visit.  Coagulation Studies: No results for input(s): LABPROT, INR in the last 72 hours.  Imaging: Ct Angio Head W Or Wo Contrast  Result Date: 01/30/2018 CLINICAL DATA:  Left PCA territory stroke. EXAM: CT ANGIOGRAPHY HEAD AND NECK TECHNIQUE: Multidetector CT imaging of the head and neck was performed using the standard protocol during bolus administration of intravenous contrast. Multiplanar CT image reconstructions and MIPs were obtained to evaluate the vascular anatomy. Carotid stenosis measurements (when applicable) are obtained utilizing NASCET criteria, using the distal internal carotid diameter as the denominator. CONTRAST:  75mL ISOVUE-370 IOPAMIDOL (ISOVUE-370) INJECTION 76% COMPARISON:  MRI same day.  CT yesterday. FINDINGS: CTA NECK FINDINGS Aortic arch: No aortic atherosclerotic changes seen. Branching pattern of the brachiocephalic vessels is normal with wide patency of the origins. Right carotid system: Common carotid artery widely patent to the bifurcation. Mild atherosclerotic calcification at the carotid bifurcation but no stenosis or irregularity. Cervical ICA is widely patent. Left carotid system: Common carotid artery widely patent to the bifurcation. Minimal atherosclerotic plaque at the carotid  bifurcation but no stenosis or irregularity. Cervical ICA is widely patent. Vertebral arteries: Vertebral artery origin detail is limited by chest motion. There is no calcified plaque. There does appear to be some narrowing at the vertebral artery origins, but this may be artifactual. Beyond the origins, the vessels are widely patent through the cervical region. Right vertebral artery is tortuous in the midportion and both are tortuous at the C3 level. There may be mild fibromuscular change of the distal left vertebral artery but I do not see any flow limiting stenosis or evidence of dissection.  Skeleton: Cervicothoracic curvature with cervical spondylosis. Other neck: No mass or lymphadenopathy. Upper chest: Normal Review of the MIP images confirms the above findings CTA HEAD FINDINGS Anterior circulation: Both internal carotid arteries are patent through the skull base and siphon regions. Remarkable lack of siphon atherosclerotic calcification for a person of this age. No stenosis. The anterior and middle cerebral vessels are patent without proximal stenosis, aneurysm or vascular malformation. Dominant A1 segment on the right. Posterior circulation: Both vertebral arteries are patent through the foramen magnum with the left being dominant. No distal vertebral stenosis. Both vertebral arteries contribute to the basilar. The basilar is tortuous but widely patent. Posterior circulation branch vessels are patent proximally, but the left PCA shows occlusion of the major branch vessel at the bifurcation 2-3 cm from its origin. Venous sinuses: Patent and normal. Anatomic variants: None significant. Delayed phase: No abnormal enhancement. Review of the MIP images confirms the above findings IMPRESSION: Relative lack of atherosclerotic disease in general for a patient of this age. The vessels are somewhat tortuous, suggesting a possible history of hypertension. No stenotic disease at the brachiocephalic vessel origins or at either carotid bifurcation. Neither vertebral artery origin is well seen. There is definitely not calcified plaque at either vertebral origin. I suspect that this is artifactual, secondary to chest breathing motion. Apparent occlusion of a major left PCA branch at the bifurcation 2-3 cm from its origin. This is consistent with embolic occlusion. Electronically Signed   By: Paulina FusiMark  Shogry M.D.   On: 01/30/2018 14:31   Ct Head Wo Contrast  Result Date: 01/29/2018 CLINICAL DATA:  Worsening confusion today. Facial droop and blurred vision. Left thalamic and temporal lobe infarctions. EXAM: CT HEAD  WITHOUT CONTRAST TECHNIQUE: Contiguous axial images were obtained from the base of the skull through the vertex without intravenous contrast. COMPARISON:  MRI 01/26/2018 FINDINGS: Brain: No discernible change. Chronic small-vessel ischemic changes affect the pons. Cerebellar atrophy. Old right frontoparietal craniectomy with atrophy and encephalomalacia of the right temporal lobe and frontoparietal brain. Low-density in the left thalamus and mesial temporal lobe on the left consistent with the recent infarction. No evidence of extension. No hemorrhage or swelling. No midline shift. No obstructive hydrocephalus. No extra-axial collection. Vascular: There is atherosclerotic calcification of the major vessels at the base of the brain. Skull: Otherwise negative. Sinuses/Orbits: Clear/normal. Other: None IMPRESSION: No acute finding by CT. Low-density in the left thalamus and mesial temporal lobe consistent with acute infarction shown in that area by CT. No evidence of extension or hemorrhage. Previous right craniotomy and cranioplasty with atrophy and encephalomalacia of the right temporal lobe and right frontoparietal brain. Electronically Signed   By: Paulina FusiMark  Shogry M.D.   On: 01/29/2018 14:50   Ct Angio Neck W Or Wo Contrast  Result Date: 01/30/2018 CLINICAL DATA:  Left PCA territory stroke. EXAM: CT ANGIOGRAPHY HEAD AND NECK TECHNIQUE: Multidetector CT imaging of the head and neck was  performed using the standard protocol during bolus administration of intravenous contrast. Multiplanar CT image reconstructions and MIPs were obtained to evaluate the vascular anatomy. Carotid stenosis measurements (when applicable) are obtained utilizing NASCET criteria, using the distal internal carotid diameter as the denominator. CONTRAST:  75mL ISOVUE-370 IOPAMIDOL (ISOVUE-370) INJECTION 76% COMPARISON:  MRI same day.  CT yesterday. FINDINGS: CTA NECK FINDINGS Aortic arch: No aortic atherosclerotic changes seen. Branching pattern  of the brachiocephalic vessels is normal with wide patency of the origins. Right carotid system: Common carotid artery widely patent to the bifurcation. Mild atherosclerotic calcification at the carotid bifurcation but no stenosis or irregularity. Cervical ICA is widely patent. Left carotid system: Common carotid artery widely patent to the bifurcation. Minimal atherosclerotic plaque at the carotid bifurcation but no stenosis or irregularity. Cervical ICA is widely patent. Vertebral arteries: Vertebral artery origin detail is limited by chest motion. There is no calcified plaque. There does appear to be some narrowing at the vertebral artery origins, but this may be artifactual. Beyond the origins, the vessels are widely patent through the cervical region. Right vertebral artery is tortuous in the midportion and both are tortuous at the C3 level. There may be mild fibromuscular change of the distal left vertebral artery but I do not see any flow limiting stenosis or evidence of dissection. Skeleton: Cervicothoracic curvature with cervical spondylosis. Other neck: No mass or lymphadenopathy. Upper chest: Normal Review of the MIP images confirms the above findings CTA HEAD FINDINGS Anterior circulation: Both internal carotid arteries are patent through the skull base and siphon regions. Remarkable lack of siphon atherosclerotic calcification for a person of this age. No stenosis. The anterior and middle cerebral vessels are patent without proximal stenosis, aneurysm or vascular malformation. Dominant A1 segment on the right. Posterior circulation: Both vertebral arteries are patent through the foramen magnum with the left being dominant. No distal vertebral stenosis. Both vertebral arteries contribute to the basilar. The basilar is tortuous but widely patent. Posterior circulation branch vessels are patent proximally, but the left PCA shows occlusion of the major branch vessel at the bifurcation 2-3 cm from its  origin. Venous sinuses: Patent and normal. Anatomic variants: None significant. Delayed phase: No abnormal enhancement. Review of the MIP images confirms the above findings IMPRESSION: Relative lack of atherosclerotic disease in general for a patient of this age. The vessels are somewhat tortuous, suggesting a possible history of hypertension. No stenotic disease at the brachiocephalic vessel origins or at either carotid bifurcation. Neither vertebral artery origin is well seen. There is definitely not calcified plaque at either vertebral origin. I suspect that this is artifactual, secondary to chest breathing motion. Apparent occlusion of a major left PCA branch at the bifurcation 2-3 cm from its origin. This is consistent with embolic occlusion. Electronically Signed   By: Paulina Fusi M.D.   On: 01/30/2018 14:31   Mr Brain Wo Contrast  Result Date: 01/30/2018 CLINICAL DATA:  Focal neuro deficit, less than 6 hours, stroke suspected. Personal history of subdural hematoma. New onset facial droop and blurred vision. EXAM: MRI HEAD WITHOUT CONTRAST TECHNIQUE: Multiplanar, multiecho pulse sequences of the brain and surrounding structures were obtained without intravenous contrast. COMPARISON:  CT head without contrast 01/29/2018 and MRI brain 01/26/2018. FINDINGS: Brain: Previously noted infarct has increased in size. Acute nonhemorrhagic infarct involves the mesial left temporal lobe. There is also progressive infarct involving the lateral left thalamus and posterior limb internal capsule. Medial left occipital lobe infarct is now present. Remote lacunar infarcts  are present within the left caudate head. No associated hemorrhage is present. There is some effacement of the sulci. Remote right parietal encephalomalacia is again seen. Dilated lateral ventricles are stable. Periaqueductal T2 hyperintensities extend along the aqueduct of Sylvius into the fourth ventricle. White matter changes in the left corona radiata  are stable. Remote lacunar infarcts are present within the right cerebellum and bilateral thalami. Vascular: Flow is present in the major intracranial arteries. Skull and upper cervical spine: The craniocervical junction is normal. Upper cervical spine is unremarkable. Marrow signal is normal. Previous right-sided craniotomy is noted. Sinuses/Orbits: Mild mucosal thickening is present inferiorly in the maxillary sinuses bilaterally. The remaining paranasal sinuses and the mastoid air cells are clear. Bilateral lens replacements are present. Globes and orbits are otherwise within normal limits. IMPRESSION: 1. Progressive left PCA territory infarcts involving the mesial left temporal lobe, medial left occipital lobe, and progressive infarct involving the lateral left thalamus and posterior limb internal capsule. 2. Remote encephalomalacia of the right parietal lobe. 3. Remote lacunar infarcts involving the left caudate head, thalami bilaterally, and right cerebellum. 4. Moderate atrophy and white matter disease otherwise compatible with chronic microvascular ischemia. Electronically Signed   By: Marin Roberts M.D.   On: 01/30/2018 12:14    Medications:  I have reviewed the patient's current medications. Scheduled: . aspirin  325 mg Oral Daily  . atorvastatin  80 mg Oral q1800  . cloNIDine  0.3 mg Transdermal Weekly  . clopidogrel  75 mg Oral Daily  . enoxaparin (LOVENOX) injection  40 mg Subcutaneous Q24H  . lisinopril  20 mg Oral Daily   And  . hydrochlorothiazide  25 mg Oral Daily  . latanoprost  1 drop Both Eyes QHS  . levothyroxine  88 mcg Oral QAC breakfast  . mirtazapine  30 mg Oral QHS  . potassium chloride  20 mEq Oral Once  . Vilazodone HCl  40 mg Oral BH-[id number]    Assessment/Plan: Patient with new neurological complaints.  Family reports patient having difficulty seeing to the right.  MRI of the brain performed nad reviewed.  Findings consistent with complaints and patient with a  new PCA infarct.  Although carotid dopplers unremarkable, at this point concerned about the posterior circulation.  Echocardiogram performed last week was unremarkable with a EF of 50-55%.  Recommendations: 1. CTA of the head and neck 2. Plavix to be started and continued along with ASA at 81mg  3. If CTA unremarkable patient to have TEE 4. Continue tele 5. Continue frequent neuro checks   LOS: 4 days   Thana Farr, MD Neurology [phone] 01/30/2018  6:24 PM  Addendum: CTA shows apparent occlusion of the left PCA.  Concern is for embolic etiology.    Recommend TEE.  Will place patient NPO after midnight.    Thana Farr, MD Neurology [phone]

## 2018-01-30 NOTE — Progress Notes (Signed)
PT Cancellation Note  Patient Details Name: Shawna [name] MRN: [id number] DOB: 11/30/1935   Cancelled Treatment:    Reason Eval/Treat Not Completed: Patient at procedure or test/unavailable. Treatment attempted; pt out of room this morning. Re attempt at a later time/date, as the schedule allows.    Scot DockHeidi E Barnes, PTA 01/30/2018, 11:44 AM

## 2018-01-30 NOTE — Progress Notes (Signed)
PT Cancellation Note  Patient Details Name: Shawna [name] MRN: [id number] DOB: 07/28/1936   Cancelled Treatment:    Reason Eval/Treat Not Completed: Patient at procedure or test/unavailable. Pt continues unavailable; MD in room with family. Re attempt tomorrow.    Scot DockHeidi E Saketh Daubert, PTA 01/30/2018, 2:25 PM

## 2018-01-30 NOTE — Progress Notes (Signed)
Patient neuro exam consistent with repeat infarcts- and MRI positive for new infarcts Neurology updated.  CT Angio of head and neck ordered

## 2018-01-31 ENCOUNTER — Encounter
Admission: EM | Disposition: A | Discharge status: Skilled Nursing Facility | Payer: Self-pay | Source: Home / Self Care | Attending: Internal Medicine

## 2018-01-31 ENCOUNTER — Inpatient Hospital Stay: Admit: 2018-01-31 | Payer: Medicare Other

## 2018-01-31 ENCOUNTER — Inpatient Hospital Stay: Payer: Medicare Other

## 2018-01-31 HISTORY — PX: TEE WITHOUT CARDIOVERSION: SHX5443

## 2018-01-31 LAB — CBC
HEMATOCRIT: 38.5 % (ref 35.0–47.0)
HEMOGLOBIN: 12.9 g/dL (ref 12.0–16.0)
MCH: 31.4 pg (ref 26.0–34.0)
MCHC: 33.4 g/dL (ref 32.0–36.0)
MCV: 94 fL (ref 80.0–100.0)
Platelets: 110 10*3/uL — ABNORMAL LOW (ref 150–440)
RBC: 4.09 MIL/uL (ref 3.80–5.20)
RDW: 13.1 % (ref 11.5–14.5)
WBC: 8.2 10*3/uL (ref 3.6–11.0)

## 2018-01-31 SURGERY — ECHOCARDIOGRAM, TRANSESOPHAGEAL
Anesthesia: Choice

## 2018-01-31 MED ORDER — FENTANYL CITRATE (PF) 100 MCG/2ML IJ SOLN
INTRAMUSCULAR | Status: AC
  Filled 2018-01-31: qty 2

## 2018-01-31 MED ORDER — SODIUM CHLORIDE FLUSH 0.9 % IV SOLN
INTRAVENOUS | Status: AC
Start: 2018-01-31 — End: 2018-02-01
  Filled 2018-01-31: qty 10

## 2018-01-31 MED ORDER — FENTANYL CITRATE (PF) 100 MCG/2ML IJ SOLN
INTRAMUSCULAR | Status: AC | PRN
  Administered 2018-01-31 (×2): 25 ug via INTRAVENOUS

## 2018-01-31 MED ORDER — ORAL CARE MOUTH RINSE
15.0000 mL | Freq: Two times a day (BID) | OROMUCOSAL | Status: DC
  Administered 2018-01-31 – 2018-02-01 (×2): 15 mL via OROMUCOSAL

## 2018-01-31 MED ORDER — LIDOCAINE VISCOUS HCL 2 % MT SOLN
OROMUCOSAL | Status: AC
  Filled 2018-01-31: qty 15

## 2018-01-31 MED ORDER — SODIUM CHLORIDE 0.9 % IV SOLN
INTRAVENOUS | Status: DC
  Administered 2018-01-31: 13:00:00 via INTRAVENOUS

## 2018-01-31 MED ORDER — BUTAMBEN-TETRACAINE-BENZOCAINE 2-2-14 % EX AERO
INHALATION_SPRAY | CUTANEOUS | Status: AC
  Filled 2018-01-31: qty 5

## 2018-01-31 MED ORDER — MIDAZOLAM HCL 5 MG/5ML IJ SOLN
INTRAMUSCULAR | Status: AC
  Filled 2018-01-31: qty 5

## 2018-01-31 MED ORDER — MIDAZOLAM HCL 2 MG/2ML IJ SOLN
INTRAMUSCULAR | Status: AC | PRN
  Administered 2018-01-31 (×2): 1 mg via INTRAVENOUS

## 2018-01-31 NOTE — Progress Notes (Signed)
    [facility] has been requested to perform a transesophageal echocardiogram on Shawna BreachJanet A Harrower for stroke.  After careful review of history and examination, the risks and benefits of transesophageal echocardiogram have been explained including risks of esophageal damage, perforation (1:10,000 risk), bleeding, pharyngeal hematoma as well as other potential complications associated with conscious sedation including aspiration, arrhythmia, respiratory failure and death. Alternatives to treatment were discussed, questions were answered. Patient is willing to proceed. Dtr at bedside.  Questions answered.  Nicolasa Duckinghristopher Berge, NP  01/31/2018 8:23 AM

## 2018-01-31 NOTE — Progress Notes (Signed)
[facility] Jomarie Long[facility] can D/C to [facility] when medically stable. Patient and her daughter Annice PihJackie are aware of above.   Baker Hughes IncorporatedBailey Akhilesh Sassone, LCSW [phone]

## 2018-01-31 NOTE — Progress Notes (Signed)
Subjective: No new neurological complaints.    Objective: Current vital signs: BP (!) 112/57 (BP Location: Left Arm)   Pulse 71   Temp 98.8 F (37.1 C) (Oral)   Resp 18   Ht 5\' 6"  (1.676 m)   Wt 99 kg (218 lb 3.2 oz)   SpO2 90%   BMI 35.22 kg/m  Vital signs in last 24 hours: Temp:  [98.3 F (36.8 C)-99.6 F (37.6 C)] 98.8 F (37.1 C) (06/04 0412) Pulse Rate:  [70-78] 71 (06/04 0927) Resp:  [16-18] 18 (06/04 0412) BP: (112-140)/(57-74) 112/57 (06/04 0927) SpO2:  [90 %-96 %] 90 % (06/04 0927)  Intake/Output from previous day: 06/03 0701 - 06/04 0700 In: 340 [P.O.:340] Out: 850 [Urine:850] Intake/Output this shift: No intake/output data recorded. Nutritional status:  Diet Order           Diet NPO time specified  Diet effective midnight          Neurologic Exam: Mental Status: Alert, oriented, thought content appropriate.  Speech fluent without evidence of aphasia.  Able to follow 3 step commands without difficulty. Cranial Nerves: II: Discs flat bilaterally; RHH, pupils equal, round, reactive to light and accommodation III,IV, VI: ptosis not present, extra-ocular motions intact bilaterally V,VII: right facial droop VIII: hearing normal bilaterally IX,X: gag reflex present XI: bilateral shoulder shrug XII: midline tongue extension Motor: Lifts all extremities against gravity Sensory: Pinprick and light touch intact throughout, bilaterally  Lab Results: Basic Metabolic Panel: Recent Labs  Lab 01/26/18 1510 01/28/18 0414 01/29/18 0436  NA 141 138 141  K 3.4* 3.3* 3.9  CL 101 103 107  CO2 27 26 27   GLUCOSE 134* 120* 120*  BUN 32* 19 21*  CREATININE 1.28* 1.00 1.14*  CALCIUM 9.6 9.1 9.1    Liver Function Tests: Recent Labs  Lab 01/26/18 1510  AST 24  ALT 13*  ALKPHOS 84  BILITOT 0.9  PROT 8.2*  ALBUMIN 4.8   No results for input(s): LIPASE, AMYLASE in the last 168 hours. No results for input(s): AMMONIA in the last 168 hours.  CBC: Recent  Labs  Lab 01/26/18 1510 01/31/18 0430  WBC 6.1 8.2  NEUTROABS 4.4  --   HGB 14.4 12.9  HCT 42.4 38.5  MCV 95.1 94.0  PLT 114* 110*    Cardiac Enzymes: Recent Labs  Lab 01/26/18 1510  TROPONINI <0.03    Lipid Panel: Recent Labs  Lab 01/28/18 0414  CHOL 149  TRIG 139  HDL 40*  CHOLHDL 3.7  VLDL 28  LDLCALC 81    CBG: Recent Labs  Lab 01/26/18 1455 01/27/18 2145 01/29/18 0040  GLUCAP 134* 105* 107*    Microbiology: No results found for this or any previous visit.  Coagulation Studies: No results for input(s): LABPROT, INR in the last 72 hours.  Imaging: Ct Angio Head W Or Wo Contrast  Result Date: 01/30/2018 CLINICAL DATA:  Left PCA territory stroke. EXAM: CT ANGIOGRAPHY HEAD AND NECK TECHNIQUE: Multidetector CT imaging of the head and neck was performed using the standard protocol during bolus administration of intravenous contrast. Multiplanar CT image reconstructions and MIPs were obtained to evaluate the vascular anatomy. Carotid stenosis measurements (when applicable) are obtained utilizing NASCET criteria, using the distal internal carotid diameter as the denominator. CONTRAST:  75mL ISOVUE-370 IOPAMIDOL (ISOVUE-370) INJECTION 76% COMPARISON:  MRI same day.  CT yesterday. FINDINGS: CTA NECK FINDINGS Aortic arch: No aortic atherosclerotic changes seen. Branching pattern of the brachiocephalic vessels is normal with wide patency of  the origins. Right carotid system: Common carotid artery widely patent to the bifurcation. Mild atherosclerotic calcification at the carotid bifurcation but no stenosis or irregularity. Cervical ICA is widely patent. Left carotid system: Common carotid artery widely patent to the bifurcation. Minimal atherosclerotic plaque at the carotid bifurcation but no stenosis or irregularity. Cervical ICA is widely patent. Vertebral arteries: Vertebral artery origin detail is limited by chest motion. There is no calcified plaque. There does appear to  be some narrowing at the vertebral artery origins, but this may be artifactual. Beyond the origins, the vessels are widely patent through the cervical region. Right vertebral artery is tortuous in the midportion and both are tortuous at the C3 level. There may be mild fibromuscular change of the distal left vertebral artery but I do not see any flow limiting stenosis or evidence of dissection. Skeleton: Cervicothoracic curvature with cervical spondylosis. Other neck: No mass or lymphadenopathy. Upper chest: Normal Review of the MIP images confirms the above findings CTA HEAD FINDINGS Anterior circulation: Both internal carotid arteries are patent through the skull base and siphon regions. Remarkable lack of siphon atherosclerotic calcification for a person of this age. No stenosis. The anterior and middle cerebral vessels are patent without proximal stenosis, aneurysm or vascular malformation. Dominant A1 segment on the right. Posterior circulation: Both vertebral arteries are patent through the foramen magnum with the left being dominant. No distal vertebral stenosis. Both vertebral arteries contribute to the basilar. The basilar is tortuous but widely patent. Posterior circulation branch vessels are patent proximally, but the left PCA shows occlusion of the major branch vessel at the bifurcation 2-3 cm from its origin. Venous sinuses: Patent and normal. Anatomic variants: None significant. Delayed phase: No abnormal enhancement. Review of the MIP images confirms the above findings IMPRESSION: Relative lack of atherosclerotic disease in general for a patient of this age. The vessels are somewhat tortuous, suggesting a possible history of hypertension. No stenotic disease at the brachiocephalic vessel origins or at either carotid bifurcation. Neither vertebral artery origin is well seen. There is definitely not calcified plaque at either vertebral origin. I suspect that this is artifactual, secondary to chest  breathing motion. Apparent occlusion of a major left PCA branch at the bifurcation 2-3 cm from its origin. This is consistent with embolic occlusion. Electronically Signed   By: Paulina Fusi M.D.   On: 01/30/2018 14:31   Ct Head Wo Contrast  Result Date: 01/29/2018 CLINICAL DATA:  Worsening confusion today. Facial droop and blurred vision. Left thalamic and temporal lobe infarctions. EXAM: CT HEAD WITHOUT CONTRAST TECHNIQUE: Contiguous axial images were obtained from the base of the skull through the vertex without intravenous contrast. COMPARISON:  MRI 01/26/2018 FINDINGS: Brain: No discernible change. Chronic small-vessel ischemic changes affect the pons. Cerebellar atrophy. Old right frontoparietal craniectomy with atrophy and encephalomalacia of the right temporal lobe and frontoparietal brain. Low-density in the left thalamus and mesial temporal lobe on the left consistent with the recent infarction. No evidence of extension. No hemorrhage or swelling. No midline shift. No obstructive hydrocephalus. No extra-axial collection. Vascular: There is atherosclerotic calcification of the major vessels at the base of the brain. Skull: Otherwise negative. Sinuses/Orbits: Clear/normal. Other: None IMPRESSION: No acute finding by CT. Low-density in the left thalamus and mesial temporal lobe consistent with acute infarction shown in that area by CT. No evidence of extension or hemorrhage. Previous right craniotomy and cranioplasty with atrophy and encephalomalacia of the right temporal lobe and right frontoparietal brain. Electronically Signed  By: Paulina Fusi M.D.   On: 01/29/2018 14:50   Ct Angio Neck W Or Wo Contrast  Result Date: 01/30/2018 CLINICAL DATA:  Left PCA territory stroke. EXAM: CT ANGIOGRAPHY HEAD AND NECK TECHNIQUE: Multidetector CT imaging of the head and neck was performed using the standard protocol during bolus administration of intravenous contrast. Multiplanar CT image reconstructions and MIPs  were obtained to evaluate the vascular anatomy. Carotid stenosis measurements (when applicable) are obtained utilizing NASCET criteria, using the distal internal carotid diameter as the denominator. CONTRAST:  75mL ISOVUE-370 IOPAMIDOL (ISOVUE-370) INJECTION 76% COMPARISON:  MRI same day.  CT yesterday. FINDINGS: CTA NECK FINDINGS Aortic arch: No aortic atherosclerotic changes seen. Branching pattern of the brachiocephalic vessels is normal with wide patency of the origins. Right carotid system: Common carotid artery widely patent to the bifurcation. Mild atherosclerotic calcification at the carotid bifurcation but no stenosis or irregularity. Cervical ICA is widely patent. Left carotid system: Common carotid artery widely patent to the bifurcation. Minimal atherosclerotic plaque at the carotid bifurcation but no stenosis or irregularity. Cervical ICA is widely patent. Vertebral arteries: Vertebral artery origin detail is limited by chest motion. There is no calcified plaque. There does appear to be some narrowing at the vertebral artery origins, but this may be artifactual. Beyond the origins, the vessels are widely patent through the cervical region. Right vertebral artery is tortuous in the midportion and both are tortuous at the C3 level. There may be mild fibromuscular change of the distal left vertebral artery but I do not see any flow limiting stenosis or evidence of dissection. Skeleton: Cervicothoracic curvature with cervical spondylosis. Other neck: No mass or lymphadenopathy. Upper chest: Normal Review of the MIP images confirms the above findings CTA HEAD FINDINGS Anterior circulation: Both internal carotid arteries are patent through the skull base and siphon regions. Remarkable lack of siphon atherosclerotic calcification for a person of this age. No stenosis. The anterior and middle cerebral vessels are patent without proximal stenosis, aneurysm or vascular malformation. Dominant A1 segment on the  right. Posterior circulation: Both vertebral arteries are patent through the foramen magnum with the left being dominant. No distal vertebral stenosis. Both vertebral arteries contribute to the basilar. The basilar is tortuous but widely patent. Posterior circulation branch vessels are patent proximally, but the left PCA shows occlusion of the major branch vessel at the bifurcation 2-3 cm from its origin. Venous sinuses: Patent and normal. Anatomic variants: None significant. Delayed phase: No abnormal enhancement. Review of the MIP images confirms the above findings IMPRESSION: Relative lack of atherosclerotic disease in general for a patient of this age. The vessels are somewhat tortuous, suggesting a possible history of hypertension. No stenotic disease at the brachiocephalic vessel origins or at either carotid bifurcation. Neither vertebral artery origin is well seen. There is definitely not calcified plaque at either vertebral origin. I suspect that this is artifactual, secondary to chest breathing motion. Apparent occlusion of a major left PCA branch at the bifurcation 2-3 cm from its origin. This is consistent with embolic occlusion. Electronically Signed   By: Paulina Fusi M.D.   On: 01/30/2018 14:31   Mr Brain Wo Contrast  Result Date: 01/30/2018 CLINICAL DATA:  Focal neuro deficit, less than 6 hours, stroke suspected. Personal history of subdural hematoma. New onset facial droop and blurred vision. EXAM: MRI HEAD WITHOUT CONTRAST TECHNIQUE: Multiplanar, multiecho pulse sequences of the brain and surrounding structures were obtained without intravenous contrast. COMPARISON:  CT head without contrast 01/29/2018 and MRI  brain 01/26/2018. FINDINGS: Brain: Previously noted infarct has increased in size. Acute nonhemorrhagic infarct involves the mesial left temporal lobe. There is also progressive infarct involving the lateral left thalamus and posterior limb internal capsule. Medial left occipital lobe  infarct is now present. Remote lacunar infarcts are present within the left caudate head. No associated hemorrhage is present. There is some effacement of the sulci. Remote right parietal encephalomalacia is again seen. Dilated lateral ventricles are stable. Periaqueductal T2 hyperintensities extend along the aqueduct of Sylvius into the fourth ventricle. White matter changes in the left corona radiata are stable. Remote lacunar infarcts are present within the right cerebellum and bilateral thalami. Vascular: Flow is present in the major intracranial arteries. Skull and upper cervical spine: The craniocervical junction is normal. Upper cervical spine is unremarkable. Marrow signal is normal. Previous right-sided craniotomy is noted. Sinuses/Orbits: Mild mucosal thickening is present inferiorly in the maxillary sinuses bilaterally. The remaining paranasal sinuses and the mastoid air cells are clear. Bilateral lens replacements are present. Globes and orbits are otherwise within normal limits. IMPRESSION: 1. Progressive left PCA territory infarcts involving the mesial left temporal lobe, medial left occipital lobe, and progressive infarct involving the lateral left thalamus and posterior limb internal capsule. 2. Remote encephalomalacia of the right parietal lobe. 3. Remote lacunar infarcts involving the left caudate head, thalami bilaterally, and right cerebellum. 4. Moderate atrophy and white matter disease otherwise compatible with chronic microvascular ischemia. Electronically Signed   By: Marin Robertshristopher  Mattern M.D.   On: 01/30/2018 12:14    Medications:  I have reviewed the patient's current medications. Scheduled: . aspirin  325 mg Oral Daily  . atorvastatin  80 mg Oral q1800  . cloNIDine  0.3 mg Transdermal Weekly  . clopidogrel  75 mg Oral Daily  . enoxaparin (LOVENOX) injection  40 mg Subcutaneous Q24H  . lisinopril  20 mg Oral Daily   And  . hydrochlorothiazide  25 mg Oral Daily  . latanoprost   1 drop Both Eyes QHS  . levothyroxine  88 mcg Oral QAC breakfast  . mirtazapine  30 mg Oral QHS  . potassium chloride  20 mEq Oral Once  . Vilazodone HCl  40 mg Oral BH-q7a    Assessment/Plan: Patient currently stable.  CTA of the head and neck significant for left PCA occlusion, consistent with new MR findings.  TEE scheduled for today.    Recommendations: 1. Continue ASA and Plavix   LOS: 5 days   Thana FarrLeslie Romana Deaton, MD Neurology [phone] 01/31/2018  11:54 AM

## 2018-01-31 NOTE — Care Management Important Message (Signed)
Copy of signed IM left with patient in room.  

## 2018-01-31 NOTE — Progress Notes (Signed)
Physical Therapy Treatment Patient Details Name: Shawna [name] MRN: [id number] DOB: 1936-04-06 Today's Date: 01/31/2018    History of Present Illness Pt. is a 82 y.o. female who was admitted to [facility] with facial droop, and blurred vision. Imaging revealed acute/early subacute infarction within the Left lateral Thalamus. pt. PMHx includes: History of Subdural Hemtoma, with Temporal Frontoparietal Craniotomy., CKD, Depression, DJD, Edema, Hyperlipidemia, Urinary Incontinence, Hypothroidism, Paratyroid Abnormality, Seizures, TMJ    PT Comments    Pt in bed, agrees to session.  Awaiting TEE.  Pt with overall limited tolerance of activity during session today.  Pt unable to assist with scooting up in bed.  Max assist.  She complained of abdominal pain with movement and pain in RLE.  While she was able to complete some exercises on LLE with verbal cues, she was unable to stay focused to finish a full set.  She generally keeps RLE stiff and allows limited motion.  Mobility skills were deferred due to pain, difficulty tolerating exercises.  Daughter in during session.  Primary nurse notified of pain complaints.   Follow Up Recommendations  SNF     Equipment Recommendations  None recommended by PT    Recommendations for Other Services       Precautions / Restrictions Precautions Precautions: Fall Restrictions Weight Bearing Restrictions: No    Mobility  Bed Mobility               General bed mobility comments: deferred  Transfers                    Ambulation/Gait                 Stairs             Wheelchair Mobility    Modified Rankin (Stroke Patients Only)       Balance       Sitting balance - Comments: deferred                                    Cognition Arousal/Alertness: Awake/alert Behavior During Therapy: WFL for tasks assessed/performed Overall Cognitive Status: Impaired/Different from baseline                                         Exercises Other Exercises Other Exercises: BLE A/AAROM for ankle pumps, heel slides, ab/add and SLR x 10 for ranges tolerated    General Comments        Pertinent Vitals/Pain Pain Assessment: 0-10 Pain Score: 10-Worst pain ever Pain Location: stomach and right leg with attempts at exercise Pain Descriptors / Indicators: Moaning Pain Intervention(s): Limited activity within patient's tolerance;Monitored during session    Home Living                      Prior Function            PT Goals (current goals can now be found in the care plan section) Progress towards PT goals: Not progressing toward goals - comment    Frequency    7X/week      PT Plan Current plan remains appropriate    Co-evaluation              AM-PAC PT "6 Clicks" Daily Activity  Outcome Measure  Difficulty turning over in  bed (including adjusting bedclothes, sheets and blankets)?: Unable Difficulty moving from lying on back to sitting on the side of the bed? : Unable Difficulty sitting down on and standing up from a chair with arms (e.g., wheelchair, bedside commode, etc,.)?: Unable Help needed moving to and from a bed to chair (including a wheelchair)?: A Lot Help needed walking in hospital room?: A Lot Help needed climbing 3-5 steps with a railing? : Total 6 Click Score: 8    End of Session   Activity Tolerance: Patient limited by pain;Other (comment) Patient left: in bed;with call bell/phone within reach;with family/visitor present Nurse Communication: Other (comment)       Time: 1610-9604: 0828-0839 PT Time Calculation (min) (ACUTE ONLY): 11 min  Charges:  $Therapeutic Exercise: 8-22 mins                    G Codes:       Danielle DessSarah Marki Frede, PTA 01/31/18, 9:37 AM

## 2018-01-31 NOTE — Sedation Documentation (Signed)
Patient arousable to voice once sedation administered, developed shallow breathing with corresponding decrease in oxygen saturation. Replaced nasal cannula with non-rebreather with improvement. Procedure aborted per MD. Patient in recovery, responsive to voice, oxygen saturation WNL on non-rebreather. Will wean as tolerated.

## 2018-01-31 NOTE — Progress Notes (Signed)
[facility] - [place] [facility]   PATIENT NAME: Shawna [name]    MR#:  [id number]  DATE OF BIRTH:  1936/07/07  SUBJECTIVE:  CHIEF COMPLAINT:   Chief Complaint  Patient presents with  . Cerebrovascular Accident   -continues to have decreased vision, MRI with worsening infarcts and new infarcts - for TEE today  REVIEW OF SYSTEMS:  Review of Systems  Constitutional: Negative for chills, fever and malaise/fatigue.  HENT: Negative for congestion, ear discharge, hearing loss and nosebleeds.   Eyes: Positive for blurred vision. Negative for double vision.  Respiratory: Negative for cough, shortness of breath and wheezing.   Cardiovascular: Negative for chest pain and palpitations.  Gastrointestinal: Negative for abdominal pain, constipation, diarrhea, nausea and vomiting.  Genitourinary: Negative for dysuria.  Musculoskeletal: Negative for myalgias.  Neurological: Positive for sensory change and focal weakness. Negative for dizziness, seizures and headaches.  Psychiatric/Behavioral: Negative for depression, hallucinations and substance abuse.    DRUG ALLERGIES:  No Known Allergies  VITALS:  Blood pressure (!) 102/56, pulse 65, temperature 98.2 F (36.8 C), temperature source Oral, resp. rate 18, height 5\' 6"  (1.676 m), weight 99 kg (218 lb 3.2 oz), SpO2 97 %.  PHYSICAL EXAMINATION:  Physical Exam  GENERAL:  82 y.o.-year-old patient lying in the bed with no acute distress.  EYES: Pupils equal, round, reactive to light and accommodation. No scleral icterus. Extraocular muscles intact.  HEENT: Head atraumatic, normocephalic. Oropharynx and nasopharynx clear.  NECK:  Supple, no jugular venous distention. No thyroid enlargement, no tenderness.  LUNGS: Normal breath sounds bilaterally, no wheezing, rales,rhonchi or crepitation. No use of accessory muscles of respiration.  CARDIOVASCULAR: S1, S2 normal. No rubs, or gallops. 2/6 systolic murmur present ABDOMEN:  Soft, nontender, nondistended. Bowel sounds present. No organomegaly or mass.  EXTREMITIES: No pedal edema, cyanosis, or clubbing.  NEUROLOGIC: left facial droop, left ptosis, otherwise Cranial nerves II through XII are intact.  Significantly decreased visual acuity in both eyes.  Muscle strength 5/5 in all extremities. Sensation intact-paresthesias of left upper extremity. Gait not checked.  PSYCHIATRIC: The patient is alert and oriented x 3.  SKIN: No obvious rash, lesion, or ulcer.    LABORATORY PANEL:   CBC Recent Labs  Lab 01/31/18 0430  WBC 8.2  HGB 12.9  HCT 38.5  PLT 110*   ------------------------------------------------------------------------------------------------------------------  Chemistries  Recent Labs  Lab 01/26/18 1510  01/29/18 0436  NA 141   < > 141  K 3.4*   < > 3.9  CL 101   < > 107  CO2 27   < > 27  GLUCOSE 134*   < > 120*  BUN 32*   < > 21*  CREATININE 1.28*   < > 1.14*  CALCIUM 9.6   < > 9.1  AST 24  --   --   ALT 13*  --   --   ALKPHOS 84  --   --   BILITOT 0.9  --   --    < > = values in this interval not displayed.   ------------------------------------------------------------------------------------------------------------------  Cardiac Enzymes Recent Labs  Lab 01/26/18 1510  TROPONINI <0.03   ------------------------------------------------------------------------------------------------------------------  RADIOLOGY:  Ct Angio Head W Or Wo Contrast  Result Date: 01/30/2018 CLINICAL DATA:  Left PCA territory stroke. EXAM: CT ANGIOGRAPHY HEAD AND NECK TECHNIQUE: Multidetector CT imaging of the head and neck was performed using the standard protocol during bolus administration of intravenous contrast. Multiplanar CT image reconstructions and MIPs were obtained  to evaluate the vascular anatomy. Carotid stenosis measurements (when applicable) are obtained utilizing NASCET criteria, using the distal internal carotid diameter as the  denominator. CONTRAST:  75mL ISOVUE-370 IOPAMIDOL (ISOVUE-370) INJECTION 76% COMPARISON:  MRI same day.  CT yesterday. FINDINGS: CTA NECK FINDINGS Aortic arch: No aortic atherosclerotic changes seen. Branching pattern of the brachiocephalic vessels is normal with wide patency of the origins. Right carotid system: Common carotid artery widely patent to the bifurcation. Mild atherosclerotic calcification at the carotid bifurcation but no stenosis or irregularity. Cervical ICA is widely patent. Left carotid system: Common carotid artery widely patent to the bifurcation. Minimal atherosclerotic plaque at the carotid bifurcation but no stenosis or irregularity. Cervical ICA is widely patent. Vertebral arteries: Vertebral artery origin detail is limited by chest motion. There is no calcified plaque. There does appear to be some narrowing at the vertebral artery origins, but this may be artifactual. Beyond the origins, the vessels are widely patent through the cervical region. Right vertebral artery is tortuous in the midportion and both are tortuous at the C3 level. There may be mild fibromuscular change of the distal left vertebral artery but I do not see any flow limiting stenosis or evidence of dissection. Skeleton: Cervicothoracic curvature with cervical spondylosis. Other neck: No mass or lymphadenopathy. Upper chest: Normal Review of the MIP images confirms the above findings CTA HEAD FINDINGS Anterior circulation: Both internal carotid arteries are patent through the skull base and siphon regions. Remarkable lack of siphon atherosclerotic calcification for a person of this age. No stenosis. The anterior and middle cerebral vessels are patent without proximal stenosis, aneurysm or vascular malformation. Dominant A1 segment on the right. Posterior circulation: Both vertebral arteries are patent through the foramen magnum with the left being dominant. No distal vertebral stenosis. Both vertebral arteries contribute to  the basilar. The basilar is tortuous but widely patent. Posterior circulation branch vessels are patent proximally, but the left PCA shows occlusion of the major branch vessel at the bifurcation 2-3 cm from its origin. Venous sinuses: Patent and normal. Anatomic variants: None significant. Delayed phase: No abnormal enhancement. Review of the MIP images confirms the above findings IMPRESSION: Relative lack of atherosclerotic disease in general for a patient of this age. The vessels are somewhat tortuous, suggesting a possible history of hypertension. No stenotic disease at the brachiocephalic vessel origins or at either carotid bifurcation. Neither vertebral artery origin is well seen. There is definitely not calcified plaque at either vertebral origin. I suspect that this is artifactual, secondary to chest breathing motion. Apparent occlusion of a major left PCA branch at the bifurcation 2-3 cm from its origin. This is consistent with embolic occlusion. Electronically Signed   By: Paulina FusiMark  Shogry M.D.   On: 01/30/2018 14:31   Ct Head Wo Contrast  Result Date: 01/29/2018 CLINICAL DATA:  Worsening confusion today. Facial droop and blurred vision. Left thalamic and temporal lobe infarctions. EXAM: CT HEAD WITHOUT CONTRAST TECHNIQUE: Contiguous axial images were obtained from the base of the skull through the vertex without intravenous contrast. COMPARISON:  MRI 01/26/2018 FINDINGS: Brain: No discernible change. Chronic small-vessel ischemic changes affect the pons. Cerebellar atrophy. Old right frontoparietal craniectomy with atrophy and encephalomalacia of the right temporal lobe and frontoparietal brain. Low-density in the left thalamus and mesial temporal lobe on the left consistent with the recent infarction. No evidence of extension. No hemorrhage or swelling. No midline shift. No obstructive hydrocephalus. No extra-axial collection. Vascular: There is atherosclerotic calcification of the major vessels at the  base of the brain. Skull: Otherwise negative. Sinuses/Orbits: Clear/normal. Other: None IMPRESSION: No acute finding by CT. Low-density in the left thalamus and mesial temporal lobe consistent with acute infarction shown in that area by CT. No evidence of extension or hemorrhage. Previous right craniotomy and cranioplasty with atrophy and encephalomalacia of the right temporal lobe and right frontoparietal brain. Electronically Signed   By: Paulina Fusi M.D.   On: 01/29/2018 14:50   Ct Angio Neck W Or Wo Contrast  Result Date: 01/30/2018 CLINICAL DATA:  Left PCA territory stroke. EXAM: CT ANGIOGRAPHY HEAD AND NECK TECHNIQUE: Multidetector CT imaging of the head and neck was performed using the standard protocol during bolus administration of intravenous contrast. Multiplanar CT image reconstructions and MIPs were obtained to evaluate the vascular anatomy. Carotid stenosis measurements (when applicable) are obtained utilizing NASCET criteria, using the distal internal carotid diameter as the denominator. CONTRAST:  75mL ISOVUE-370 IOPAMIDOL (ISOVUE-370) INJECTION 76% COMPARISON:  MRI same day.  CT yesterday. FINDINGS: CTA NECK FINDINGS Aortic arch: No aortic atherosclerotic changes seen. Branching pattern of the brachiocephalic vessels is normal with wide patency of the origins. Right carotid system: Common carotid artery widely patent to the bifurcation. Mild atherosclerotic calcification at the carotid bifurcation but no stenosis or irregularity. Cervical ICA is widely patent. Left carotid system: Common carotid artery widely patent to the bifurcation. Minimal atherosclerotic plaque at the carotid bifurcation but no stenosis or irregularity. Cervical ICA is widely patent. Vertebral arteries: Vertebral artery origin detail is limited by chest motion. There is no calcified plaque. There does appear to be some narrowing at the vertebral artery origins, but this may be artifactual. Beyond the origins, the vessels  are widely patent through the cervical region. Right vertebral artery is tortuous in the midportion and both are tortuous at the C3 level. There may be mild fibromuscular change of the distal left vertebral artery but I do not see any flow limiting stenosis or evidence of dissection. Skeleton: Cervicothoracic curvature with cervical spondylosis. Other neck: No mass or lymphadenopathy. Upper chest: Normal Review of the MIP images confirms the above findings CTA HEAD FINDINGS Anterior circulation: Both internal carotid arteries are patent through the skull base and siphon regions. Remarkable lack of siphon atherosclerotic calcification for a person of this age. No stenosis. The anterior and middle cerebral vessels are patent without proximal stenosis, aneurysm or vascular malformation. Dominant A1 segment on the right. Posterior circulation: Both vertebral arteries are patent through the foramen magnum with the left being dominant. No distal vertebral stenosis. Both vertebral arteries contribute to the basilar. The basilar is tortuous but widely patent. Posterior circulation branch vessels are patent proximally, but the left PCA shows occlusion of the major branch vessel at the bifurcation 2-3 cm from its origin. Venous sinuses: Patent and normal. Anatomic variants: None significant. Delayed phase: No abnormal enhancement. Review of the MIP images confirms the above findings IMPRESSION: Relative lack of atherosclerotic disease in general for a patient of this age. The vessels are somewhat tortuous, suggesting a possible history of hypertension. No stenotic disease at the brachiocephalic vessel origins or at either carotid bifurcation. Neither vertebral artery origin is well seen. There is definitely not calcified plaque at either vertebral origin. I suspect that this is artifactual, secondary to chest breathing motion. Apparent occlusion of a major left PCA branch at the bifurcation 2-3 cm from its origin. This is  consistent with embolic occlusion. Electronically Signed   By: Paulina Fusi M.D.   On: 01/30/2018 14:31  Mr Brain Wo Contrast  Result Date: 01/30/2018 CLINICAL DATA:  Focal neuro deficit, less than 6 hours, stroke suspected. Personal history of subdural hematoma. New onset facial droop and blurred vision. EXAM: MRI HEAD WITHOUT CONTRAST TECHNIQUE: Multiplanar, multiecho pulse sequences of the brain and surrounding structures were obtained without intravenous contrast. COMPARISON:  CT head without contrast 01/29/2018 and MRI brain 01/26/2018. FINDINGS: Brain: Previously noted infarct has increased in size. Acute nonhemorrhagic infarct involves the mesial left temporal lobe. There is also progressive infarct involving the lateral left thalamus and posterior limb internal capsule. Medial left occipital lobe infarct is now present. Remote lacunar infarcts are present within the left caudate head. No associated hemorrhage is present. There is some effacement of the sulci. Remote right parietal encephalomalacia is again seen. Dilated lateral ventricles are stable. Periaqueductal T2 hyperintensities extend along the aqueduct of Sylvius into the fourth ventricle. White matter changes in the left corona radiata are stable. Remote lacunar infarcts are present within the right cerebellum and bilateral thalami. Vascular: Flow is present in the major intracranial arteries. Skull and upper cervical spine: The craniocervical junction is normal. Upper cervical spine is unremarkable. Marrow signal is normal. Previous right-sided craniotomy is noted. Sinuses/Orbits: Mild mucosal thickening is present inferiorly in the maxillary sinuses bilaterally. The remaining paranasal sinuses and the mastoid air cells are clear. Bilateral lens replacements are present. Globes and orbits are otherwise within normal limits. IMPRESSION: 1. Progressive left PCA territory infarcts involving the mesial left temporal lobe, medial left occipital  lobe, and progressive infarct involving the lateral left thalamus and posterior limb internal capsule. 2. Remote encephalomalacia of the right parietal lobe. 3. Remote lacunar infarcts involving the left caudate head, thalami bilaterally, and right cerebellum. 4. Moderate atrophy and white matter disease otherwise compatible with chronic microvascular ischemia. Electronically Signed   By: Marin Roberts M.D.   On: 01/30/2018 12:14    EKG:   Orders placed or performed during the hospital encounter of 01/26/18  . ED EKG  . ED EKG    ASSESSMENT AND PLAN:   82 year old female patient with history of subdural hematoma requiring craniotomy, CVA, chronic kidney disease stage III, hyperlipidemia, hypertension, osteoarthritis, seizures currently under hospitalist service for new onset stroke  1. Acute thalamic and hippocampal CVA- MRI of the brain confirming acute infarcts in left lateral thalamus and left mid hippocampus. - Due to worsening neurological findings, repeat MRI on 01/30/18 confirming worsening of left PCA infarcts involving left temporal lobe, left occipital, left thalamus and post limb of internal capsule. -Appreciate neurology consult. Carotid ultrasound stenosis less than 50% - CT angiogram of head and neck with no hemodynamically significant stenosis -Echocardiogram with no embolic source noted. -on aspirin and statin. Plavix added on 01/30/18 - Neurology recommended TEE for embolic source- if negative- long term cardiac monitor as outpatient Physical therapy, speech therapy and occupational therapy -and will need rehab at discharge  2. Hypertension - continue medical management -On clonidine, lisinopril and hydrochlorothiazide  3. Hyperlipidemia Continue Lipitor  4. Chronic kidney disease stage III Monitor renal function  5. Hypoxia- during procedure  - could be from underlying strokes, age- CXR ordered - If TEE needed- needs to be done under  anaesthesia -appreciate cardiology consult  6. DVT prophylaxis With subcu Lovenox daily   To [facility] whenever medically ready Updated daughter at bedside   All the records are reviewed and case discussed with Care Management/Social Workerr. Management plans discussed with the patient, family and they are in agreement.  CODE STATUS: Full code  TOTAL TIME TAKING CARE OF THIS PATIENT: 34 minutes.   POSSIBLE D/C IN 1-2 DAYS, DEPENDING ON CLINICAL CONDITION.   Enid Baas M.D on 01/31/2018 at 2:14 PM  Between 7am to 6pm - Pager - [phone]  After 6pm go to [email or web address] - Social research officer, government  [facility]  Office  [phone]  CC: Primary care physician; Lauro Regulus, MD

## 2018-01-31 NOTE — Care Management (Signed)
Developed neurological sx yesterday and MRI show progression of cva

## 2018-01-31 NOTE — Interval H&P Note (Signed)
History and Physical Interval Note:  01/31/2018 12:48 PM  Shawna [name]  has presented today for TEE, with the diagnosis of stroke. The various methods of treatment have been discussed with the patient and family. After consideration of risks, benefits and other options for treatment, the patient has consented to  Procedure(s): TRANSESOPHAGEAL ECHOCARDIOGRAM (TEE) (N/A) as a surgical intervention .  The patient's history has been reviewed, patient examined, no change in status, stable for surgery.  I have reviewed the patient's chart and labs.  Questions were answered to the patient's satisfaction.     Alantra Popoca

## 2018-01-31 NOTE — CV Procedure (Signed)
    Transesophageal Echocardiogram Note  Shawna [name] [id number] 10/16/1935  Procedure: Transesophageal Echocardiogram Indications: Recurrent Stroke  Procedure Details Consent: Obtained Time Out: Verified patient identification, verified procedure, site/side was marked, verified correct patient position, special equipment/implants available, Radiology Safety Procedures followed,  medications/allergies/relevent history reviewed, required imaging and test results available.  Performed  Medications:  During this procedure the patient is administered a total of Versed 2 mg and Fentanyl 50 mcg  to achieve and maintain moderate conscious sedation.  The patient's heart rate, blood pressure, and oxygen saturation are monitored continuously during the procedure. The period of conscious sedation is 10 minutes, of which I was present face-to-face 100% of this time.  During attempted intubation of the esophagus, the patient desaturated with oxygen saturation reaching a nadir of 64%.  With removal of the TEE probe, jaw thrust, and placement of NRB mask, oxygen saturation quickly returned to normal and has remained >95%.  No further attempts were made to intubate the esophagus.  Recommend TEE with anesthesia for airway management.  Complications: No apparent complications Patient did not tolerate procedure well, as outlined above.   Yvonne Kendallhristopher Reise Hietala, MD 01/31/2018, 1:22 PM

## 2018-01-31 NOTE — H&P (View-Only) (Signed)
    [facility] has been requested to perform a transesophageal echocardiogram on Shawna [name] for stroke.  After careful review of history and examination, the risks and benefits of transesophageal echocardiogram have been explained including risks of esophageal damage, perforation (1:10,000 risk), bleeding, pharyngeal hematoma as well as other potential complications associated with conscious sedation including aspiration, arrhythmia, respiratory failure and death. Alternatives to treatment were discussed, questions were answered. Patient is willing to proceed. Dtr at bedside.  Questions answered.  Naziah Weckerly, NP  01/31/2018 8:23 AM   

## 2018-01-31 NOTE — Progress Notes (Addendum)
Occupational Therapy Treatment Patient Details Name: Shawna [name] MRN: [id number] DOB: 08/19/1936 Today's Date: 01/31/2018    History of present illness Pt. is a 82 y.o. female who [facility] with facial droop, and blurred vision. Imaging revealed acute/early subacute infarction within the Left lateral Thalamus. pt. PMHx includes: History of Subdural Hemtoma, with Temporal Frontoparietal Craniotomy., CKD, Depression, DJD, Edema, Hyperlipidemia, Urinary Incontinence, Hypothroidism, Paratyroid Abnormality, Seizures, TMJ   OT comments  Pt. Is preparing to have a procedure today. Pt. Tolerated PROM, and AAROM to the RUE, and AROM to the LUE with PROM to the end ranges. Pt.'s daughter was present. Pt. and daughter were provided with education about RUE ROM, and positioning of the RUE. Pt. Presented with tone, and tightness initially in the RUE, however this decreased as ROM progressed. Pt. worked on hand to face patterns with hand-over hand assist, and cues for motor planning. When attempting movements with her RUE, pt. moves her LUE. Pt. Requires verbal cues to attempt with her RUE. Pt. continues to benefit from OT services for ADL training, A/E training, neuro muscular re-education, and pt. education about home modification. Pt. would benefit from SNF level of care upon discharge. Pt. Could benefit from follow-up OT services at discharge.   Follow Up Recommendations  SNF    Equipment Recommendations       Recommendations for Other Services      Precautions / Restrictions Precautions Precautions: Fall Restrictions Weight Bearing Restrictions: No       Mobility Bed Mobility    Pt. Seen at bed level           General bed mobility comments: deferred  Transfers  Pt. Seen at bed level                    Balance       Sitting balance - Comments: deferred                                   ADL either performed or assessed with clinical judgement    ADL Overall ADL's : Needs assistance/impaired Eating/Feeding: NPO(Pt. is preparing for a procedure today.)   Grooming: Set up;Minimal assistance(verbal cues, and assist needed)   Upper Body Bathing: Maximal assistance   Lower Body Bathing: Set up;Maximal assistance   Upper Body Dressing : Maximal assistance   Lower Body Dressing: Set up;Maximal assistance                       Vision       Perception     Praxis      Cognition Arousal/Alertness: Awake/alert Behavior During Therapy: WFL for tasks assessed/performed Overall Cognitive Status: Impaired/Different from baseline Area of Impairment: Attention                   Current Attention Level: Alternating           General Comments: Delay in processing        Exercises Other Exercises Other Exercises: BUEs AROM/AAROM/PROM    Shoulder Instructions       General Comments      Pertinent Vitals/ Pain       Pain Assessment: No/denies pain(Initially reports  right shoulder pain, however declined any further reports of pain.) Pain Score: 10-Worst pain ever Pain Location: stomach and right leg with attempts at exercise Pain Descriptors / Indicators: Moaning Pain Intervention(s):  Limited activity within patient's tolerance;Monitored during session  Home Living                                          Prior Functioning/Environment              Frequency  Min 2X/week        Progress Toward Goals  OT Goals(current goals can now be found in the care plan section)     Acute Rehab OT Goals Patient Stated Goal: To get stronger OT Goal Formulation: Patient unable to participate in goal setting  Plan      Co-evaluation                 AM-PAC PT "6 Clicks" Daily Activity     Outcome Measure   Help from another person eating meals?: (NPO for procedure today) Help from another person taking care of personal grooming?: A Little Help from another person  toileting, which includes using toliet, bedpan, or urinal?: A Lot Help from another person bathing (including washing, rinsing, drying)?: A Lot Help from another person to put on and taking off regular upper body clothing?: A Lot Help from another person to put on and taking off regular lower body clothing?: A Lot 6 Click Score: 11    End of Session    OT Visit Diagnosis: Muscle weakness (generalized) (M62.81)   Activity Tolerance Patient tolerated treatment well   Patient Left in bed   Nurse Communication          Time: 1030-1053 OT Time Calculation (min): 23 min  Charges: OT General Charges $OT Visit: 1 Visit OT Treatments $Self Care/Home Management : 23-37 mins  Olegario Messier, MS, OTR/L  Olegario Messier 01/31/2018, 11:02 AM

## 2018-02-01 ENCOUNTER — Inpatient Hospital Stay: Payer: Medicare Other | Admitting: Certified Registered"

## 2018-02-01 ENCOUNTER — Encounter
Admission: EM | Disposition: A | Discharge status: Skilled Nursing Facility | Payer: Self-pay | Source: Home / Self Care | Attending: Internal Medicine

## 2018-02-01 ENCOUNTER — Encounter: Payer: Self-pay | Admitting: Certified Registered Nurse Anesthetist

## 2018-02-01 ENCOUNTER — Inpatient Hospital Stay (HOSPITAL_COMMUNITY)
Admit: 2018-02-01 | Discharge: 2018-02-01 | Disposition: A | Discharge status: Home / Self Care | Payer: Medicare Other | Attending: Internal Medicine | Admitting: Internal Medicine

## 2018-02-01 ENCOUNTER — Encounter: Payer: Self-pay | Admitting: *Deleted

## 2018-02-01 DIAGNOSIS — I6389 Other cerebral infarction: Secondary | ICD-10-CM

## 2018-02-01 HISTORY — PX: TEE WITHOUT CARDIOVERSION: SHX5443

## 2018-02-01 LAB — GLUCOSE, CAPILLARY: Glucose-Capillary: 125 mg/dL — ABNORMAL HIGH (ref 65–99)

## 2018-02-01 SURGERY — ECHOCARDIOGRAM, TRANSESOPHAGEAL
Anesthesia: General

## 2018-02-01 MED ORDER — TAMSULOSIN HCL 0.4 MG PO CAPS: 0.4000 mg | ORAL_CAPSULE | Freq: Every day | ORAL | 1 refills | Status: DC

## 2018-02-01 MED ORDER — SODIUM CHLORIDE 0.9 % IV SOLN: INTRAVENOUS | Status: DC

## 2018-02-01 MED ORDER — KLOR-CON M20 20 MEQ PO TBCR: 20.0000 meq | EXTENDED_RELEASE_TABLET | Freq: Every day | ORAL | 2 refills | Status: DC | PRN

## 2018-02-01 MED ORDER — CLOPIDOGREL BISULFATE 75 MG PO TABS: 75.0000 mg | ORAL_TABLET | Freq: Every day | ORAL | 2 refills | Status: AC

## 2018-02-01 MED ORDER — ASPIRIN 325 MG PO TABS: 325.0000 mg | ORAL_TABLET | Freq: Every day | ORAL | 2 refills | Status: DC

## 2018-02-01 MED ORDER — LIDOCAINE VISCOUS HCL 2 % MT SOLN
OROMUCOSAL | Status: AC
  Filled 2018-02-01: qty 15

## 2018-02-01 MED ORDER — BUTAMBEN-TETRACAINE-BENZOCAINE 2-2-14 % EX AERO
INHALATION_SPRAY | CUTANEOUS | Status: AC
  Filled 2018-02-01: qty 5

## 2018-02-01 MED ORDER — PROPOFOL 10 MG/ML IV BOLUS
INTRAVENOUS | Status: DC | PRN
  Administered 2018-02-01 (×3): 20 mg via INTRAVENOUS
  Administered 2018-02-01: 30 mg via INTRAVENOUS
  Administered 2018-02-01: 40 mg via INTRAVENOUS

## 2018-02-01 MED ORDER — ATORVASTATIN CALCIUM 80 MG PO TABS: 80.0000 mg | ORAL_TABLET | Freq: Every day | ORAL | 2 refills | Status: DC

## 2018-02-01 MED ORDER — HALOPERIDOL LACTATE 5 MG/ML IJ SOLN
1.0000 mg | Freq: Once | INTRAMUSCULAR | Status: AC
  Administered 2018-02-01: 1 mg via INTRAVENOUS
  Filled 2018-02-01: qty 1

## 2018-02-01 MED ORDER — FUROSEMIDE 20 MG PO TABS: 20.0000 mg | ORAL_TABLET | Freq: Every day | ORAL | Status: DC | PRN

## 2018-02-01 MED ORDER — PROPOFOL 10 MG/ML IV BOLUS
INTRAVENOUS | Status: AC
  Filled 2018-02-01: qty 20

## 2018-02-01 MED ORDER — HALOPERIDOL LACTATE 5 MG/ML IJ SOLN: 1.0000 mg | Freq: Four times a day (QID) | INTRAMUSCULAR | Status: DC | PRN

## 2018-02-01 NOTE — Progress Notes (Signed)
OT Cancellation Note  Patient Details Name: Ernst BreachJanet A Arakawa MRN: [id number] DOB: 06/19/1936   Cancelled Treatment:    Reason Eval/Treat Not Completed: Patient at procedure or test/ unavailable. Pt out of room for procedure. Unavailable for OT tx this am. Will re-attempt at later date/time as pt is available.  Richrd PrimeJamie Stiller, MPH, MS, OTR/L ascom [phone] 02/01/18, 7:43 AM

## 2018-02-01 NOTE — Discharge Summary (Signed)
[employer] - [employer] at [facility]   PATIENT NAME: Shawna [name]    MR#:  [id number]  DATE OF BIRTH:  11-09-1935  DATE OF ADMISSION:  01/26/2018   ADMITTING PHYSICIAN: Alford Highland, MD  DATE OF DISCHARGE: 02/01/18  PRIMARY CARE PHYSICIAN: Lauro Regulus, MD   ADMISSION DIAGNOSIS:   Stroke (cerebrum) [facility]) [I63.9] Cerebrovascular accident (CVA), unspecified mechanism (HCC) [I63.9]  DISCHARGE DIAGNOSIS:   Active Problems:   Stroke [facility])   SECONDARY DIAGNOSIS:   Past Medical History:  Diagnosis Date  . Cerebral infarction (HCC)   . CKD (chronic kidney disease), stage III (HCC)   . Depression   . DJD (degenerative joint disease)   . Edema   . High cholesterol   . Hyperlipidemia, unspecified   . Hypertension   . Hypothyroidism   . Malaise   . Morbid obesity (HCC)   . Osteoarthritis   . Parathyroid abnormality (HCC)   . Seizures (HCC)    a. following remote stroke.  . Stroke [facility])    a. 1964-->residual right arm wkns.  Previously on coumadin - pt says for just a few yrs.  . Subdural hematoma (HCC)    a. 10/2001 [facility] req temporal frontoparietal craniotomy following fall. ? whether or not pt on coumadin @ time.  Notes indicate yes but pt denies.  . Thyroid disease   . TMJ (dislocation of temporomandibular joint)   . Tuberculosis    a. ~ 1950  . Urinary incontinence   . Weight loss     HOSPITAL COURSE:   82 year old female patient with history of subdural hematoma requiring craniotomy, CVA, chronic kidney disease stage III, hyperlipidemia, hypertension, osteoarthritis, seizures currently under hospitalist service for new onset stroke  1. Acute thalamic and hippocampal CVA- MRI of the brain confirming acute infarcts in left lateral thalamus and left mid hippocampus on admission. - Due to worsening neurological findings, repeat MRI on 01/30/18 confirming worsening of left PCA infarcts involving left temporal lobe, left occipital, left  thalamus and post limb of internal capsule. -Appreciate neurology consult. Carotid ultrasound stenosis less than 50% - CT angiogram of head and neck with no hemodynamically significant stenosis -Echocardiogram with no embolic source noted. -on aspirin and statin. Plavix added on 01/30/18 - Neurology recommended TEE for embolic source- but TEE negative for any mural thrombus, PFO or cardiac emboli -Continue aspirin and Plavix at discharge.  Recommend outpatient cardiac monitoring. Physical therapy, speech therapy and occupational therapy -and will need rehab at discharge  2. Hypertension - continue medical management -On clonidine patch, lisinopril and hydrochlorothiazide  3. Hyperlipidemia Continue Lipitor  4. Chronic kidney disease stage III Monitor renal function  5.   Urinary retention-patient developed acute urinary retention in the hospital.  Foley catheter has been placed after 3 attempts of in and out catheterizations.  An attempt to remove Foley is being done prior to discharge.,  If unsuccessful and voiding by herself-will be discharged with the Foley catheter.  Flomax started. -Reattempt voiding trial in 1 week    To [facility] today Updated daughter and Husband at bedside    DISCHARGE CONDITIONS:   Guarded  CONSULTS OBTAINED:   Treatment Team:  Kym Groom, MD End, Cristal Deer, MD  DRUG ALLERGIES:   No Known Allergies DISCHARGE MEDICATIONS:   Allergies as of 02/01/2018   No Known Allergies     Medication List    STOP taking these medications   dipyridamole-aspirin 200-25 MG 12hr capsule Commonly known as:  AGGRENOX  oxybutynin 10 MG 24 hr tablet Commonly known as:  DITROPAN-XL   simvastatin 80 MG tablet Commonly known as:  ZOCOR     TAKE these medications   acetaminophen 325 MG tablet Commonly known as:  TYLENOL Take 650 mg by mouth every 4 (four) hours as needed for mild pain or moderate pain.   aspirin 325 MG tablet Take 1  tablet (325 mg total) by mouth daily. Start taking on:  02/02/2018   atorvastatin 80 MG tablet Commonly known as:  LIPITOR Take 1 tablet (80 mg total) by mouth daily at 6 PM.   cloNIDine 0.3 mg/24hr patch Commonly known as:  CATAPRES - Dosed in mg/24 hr Place 1 patch onto the skin once a week.   clopidogrel 75 MG tablet Commonly known as:  PLAVIX Take 1 tablet (75 mg total) by mouth daily. Start taking on:  02/02/2018   furosemide 20 MG tablet Commonly known as:  LASIX Take 1 tablet (20 mg total) by mouth daily as needed for fluid or edema. What changed:    when to take this  reasons to take this   KLOR-CON M20 20 MEQ tablet Generic drug:  potassium chloride SA Take 1 tablet (20 mEq total) by mouth daily as needed. While on lasix What changed:    when to take this  reasons to take this  additional instructions   latanoprost 0.005 % ophthalmic solution Commonly known as:  XALATAN Place 1 drop into both eyes at bedtime.   levothyroxine 88 MCG tablet Commonly known as:  SYNTHROID, LEVOTHROID Take 1 tablet by mouth daily.   lisinopril-hydrochlorothiazide 20-25 MG tablet Commonly known as:  PRINZIDE,ZESTORETIC Take 1 tablet by mouth daily.   mirtazapine 45 MG tablet Commonly known as:  REMERON Take 45 mg by mouth at bedtime.   polyethylene glycol packet Commonly known as:  MIRALAX / GLYCOLAX Take 17 g by mouth daily as needed for mild constipation.   tamsulosin 0.4 MG Caps capsule Commonly known as:  FLOMAX Take 1 capsule (0.4 mg total) by mouth daily.   VIIBRYD 40 MG Tabs Generic drug:  Vilazodone HCl Take 40 mg by mouth every morning.        DISCHARGE INSTRUCTIONS:   1. Cardiology f/u in 1 week with Dr. Mariah Milling for cardiac monitoring set up 2. PCP f/u in 1 week  DIET:   Cardiac diet  ACTIVITY:   Activity as tolerated  OXYGEN:   Home Oxygen: Yes.    Oxygen Delivery: 1-2 liters/min via Patient connected to nasal cannula oxygen  DISCHARGE  LOCATION:   nursing home   If you experience worsening of your admission symptoms, develop shortness of breath, life threatening emergency, suicidal or homicidal thoughts you must seek medical attention immediately by calling 911 or calling your MD immediately  if symptoms less severe.  You Must read complete instructions/literature along with all the possible adverse reactions/side effects for all the Medicines you take and that have been prescribed to you. Take any new Medicines after you have completely understood and accpet all the possible adverse reactions/side effects.   Please note  You were cared for by a hospitalist during your hospital stay. If you have any questions about your discharge medications or the care you received while you were in the hospital after you are discharged, you can call the unit and asked to speak with the hospitalist on call if the hospitalist that took care of you is not available. Once you are discharged, your primary care physician  will handle any further medical issues. Please note that NO REFILLS for any discharge medications will be authorized once you are discharged, as it is imperative that you return to your primary care physician (or establish a relationship with a primary care physician if you do not have one) for your aftercare needs so that they can reassess your need for medications and monitor your lab values.    On the day of Discharge:  VITAL SIGNS:   Blood pressure (!) 110/56, pulse 75, temperature 99.3 F (37.4 C), temperature source Oral, resp. rate 18, height 5\' 6"  (1.676 m), weight 98.9 kg (218 lb), SpO2 94 %.  PHYSICAL EXAMINATION:   GENERAL:  82 y.o.-year-old patient lying in the bed with no acute distress.  EYES: Pupils equal, round, reactive to light and accommodation. No scleral icterus. Extraocular muscles intact.  HEENT: Head atraumatic, normocephalic. Oropharynx and nasopharynx clear.  NECK:  Supple, no jugular venous  distention. No thyroid enlargement, no tenderness.  LUNGS: Normal breath sounds bilaterally, no wheezing, rales,rhonchi or crepitation. No use of accessory muscles of respiration.  CARDIOVASCULAR: S1, S2 normal. No rubs, or gallops. 2/6 systolic murmur present ABDOMEN: Soft, nontender, nondistended. Bowel sounds present. No organomegaly or mass.  EXTREMITIES: No pedal edema, cyanosis, or clubbing.  NEUROLOGIC: left facial droop, left ptosis, otherwise Cranial nerves II through XII are intact.  Significantly decreased visual acuity in both eyes especially on the right.  Muscle strength 5/5 in all extremities. Sensation intact-paresthesias of left upper extremity. Gait not checked.  PSYCHIATRIC: The patient is alert and oriented x 2-3.  SKIN: No obvious rash, lesion, or ulcer.   DATA REVIEW:   CBC Recent Labs  Lab 01/31/18 0430  WBC 8.2  HGB 12.9  HCT 38.5  PLT 110*    Chemistries  Recent Labs  Lab 01/26/18 1510  01/29/18 0436  NA 141   < > 141  K 3.4*   < > 3.9  CL 101   < > 107  CO2 27   < > 27  GLUCOSE 134*   < > 120*  BUN 32*   < > 21*  CREATININE 1.28*   < > 1.14*  CALCIUM 9.6   < > 9.1  AST 24  --   --   ALT 13*  --   --   ALKPHOS 84  --   --   BILITOT 0.9  --   --    < > = values in this interval not displayed.     Microbiology Results  No results found for this or any previous visit.  RADIOLOGY:  Dg Chest Port 1 View  Result Date: 01/31/2018 CLINICAL DATA:  Hypoxia.  Recent CVA. EXAM: PORTABLE CHEST 1 VIEW COMPARISON:  Chest x-ray of June 12, 2017 FINDINGS: There is chronic mild elevation of the left hemidiaphragm. The lungs are reasonably well inflated. The left perihilar lung markings are mildly increased. The cardiopericardial silhouette is mildly enlarged. The pulmonary vascularity is not engorged. The bony thorax exhibits no acute abnormality. IMPRESSION: Subsegmental atelectasis in the left perihilar region. No alveolar pneumonia nor pulmonary edema.  Electronically Signed   By: David  SwazilandJordan M.D.   On: 01/31/2018 15:10     Management plans discussed with the patient, family and they are in agreement.  CODE STATUS:     Code Status Orders  (From admission, onward)        Start     Ordered   01/26/18 1908  Full code  Continuous  01/26/18 1909    Code Status History    Date Active Date Inactive Code Status Order ID Comments User Context   06/10/2017 1103 06/15/2017 1746 Full Code [id number]  Milagros Loll, MD ED    Advance Directive Documentation     Most Recent Value  Type of Advance Directive  Healthcare Power of Attorney, Living will  Pre-existing out of facility DNR order (yellow form or pink MOST form)  -  "MOST" Form in Place?  -      TOTAL TIME TAKING CARE OF THIS PATIENT: 38 minutes.    Shatoria Stooksbury M.D on 02/01/2018 at 1:56 PM  Between 7am to 6pm - Pager - [phone]  After 6pm go to [email or web address] - Scientist, research (life sciences) [facility]  Office  [phone]  CC: Primary care physician; Lauro Regulus, MD   Note: This dictation was prepared with Dragon dictation along with smaller phrase technology. Any transcriptional errors that result from this process are unintentional.

## 2018-02-01 NOTE — Anesthesia Post-op Follow-up Note (Signed)
Anesthesia QCDR form completed.        

## 2018-02-01 NOTE — Clinical Social Work Note (Signed)
Patient is medically ready for discharge to [facility] today. CSW notified patient and daughter Annice PihJackie [phone] of above. CSW notified Jomarie LongsJoseph, liaison at [facility] of above as well. RN to call report and call for transport.   Ruthe Mannanandace Jeily Guthridge MSW, [address] RdCSWA [phone]

## 2018-02-01 NOTE — Plan of Care (Signed)
  Problem: Education: Goal: Knowledge of disease or condition will improve Outcome: Not Progressing Goal: Knowledge of secondary prevention will improve Outcome: Not Progressing Goal: Knowledge of patient specific risk factors addressed and post discharge goals established will improve Outcome: Not Progressing   Problem: Coping: Goal: Will verbalize positive feelings about self Outcome: Not Progressing   Problem: Self-Care: Goal: Ability to participate in self-care as condition permits will improve Outcome: Not Progressing Goal: Ability to communicate needs accurately will improve Outcome: Not Progressing   Problem: Nutrition: Goal: Risk of aspiration will decrease Outcome: Not Progressing   Problem: Ischemic Stroke/TIA Tissue Perfusion: Goal: Complications of ischemic stroke/TIA will be minimized Outcome: Not Progressing   Problem: Clinical Measurements: Goal: Ability to maintain clinical measurements within normal limits will improve Outcome: Not Progressing Goal: Will remain free from infection Outcome: Progressing Goal: Diagnostic test results will improve Outcome: Not Progressing Goal: Respiratory complications will improve Outcome: Not Progressing Goal: Cardiovascular complication will be avoided Outcome: Not Progressing   Problem: Pain Managment: Goal: General experience of comfort will improve Outcome: Progressing   Problem: Safety: Goal: Ability to remain free from injury will improve Outcome: Progressing   Problem: Skin Integrity: Goal: Risk for impaired skin integrity will decrease Outcome: Progressing

## 2018-02-01 NOTE — Progress Notes (Signed)
Transesophageal Echocardiogram   Indication: acute CVA Requesting/ordering  physician: Thana Farreynolds, Leslie  Procedure: Benzocaine spray x2 and 2 mls x 2 of viscous lidocaine were given orally to provide local anesthesia to the oropharynx. The patient was positioned supine on the left side, bite block provided. The patient was moderately sedated with the doses of versed and fentanyl as detailed below.  Using digital technique an omniplane probe was advanced into the distal esophagus without incident.   Moderate/general sedation:  Sedation used:  Propofol, given by anesthesia   See report in EPIC  for complete details: In brief, transgastric imaging revealed normal LV function with no RWMAs and no mural apical thrombus.  .  Estimated ejection fraction was 55%.  Right sided cardiac chambers were normal with no evidence of pulmonary hypertension.  Imaging of the septum showed no ASD or VSD Bubble study was negative for shunt 2D and color flow confirmed no PFO  The LA was well visualized in orthogonal views.  Mildly dilated. There was no spontaneous contrast and no thrombus in the LA and LA appendage   The descending thoracic aorta had mild aortic atherosclerosis with no evidence of aneurysmal dilation or disection   Julien Nordmannimothy Madaline Lefeber 02/01/2018 8:27 AM

## 2018-02-01 NOTE — Anesthesia Procedure Notes (Signed)
Performed by: Jerae Izard, CRNA Pre-anesthesia Checklist: Patient identified, Emergency Drugs available, Suction available, Patient being monitored and Timeout performed Patient Re-evaluated:Patient Re-evaluated prior to induction Oxygen Delivery Method: Nasal cannula       

## 2018-02-01 NOTE — Progress Notes (Signed)
PT Cancellation Note  Patient Details Name: Shawna BreachJanet A Weimar MRN: [id number] DOB: 12/07/1935   Cancelled Treatment:    Reason Eval/Treat Not Completed: Patient at procedure or test/unavailable. Re attempt at a later time/date, as pt available.    Scot DockHeidi E Barnes, PTA 02/01/2018, 10:14 AM

## 2018-02-01 NOTE — Progress Notes (Signed)
Subjective: Patient alert and awake s/p TEE this morning.    Objective: Current vital signs: BP (!) 100/59 (BP Location: Left Arm)   Pulse 75   Temp 99.4 F (37.4 C) (Oral)   Resp 12   Ht 5\' 6"  (1.676 m)   Wt 98.9 kg (218 lb)   SpO2 95%   BMI 35.19 kg/m  Vital signs in last 24 hours: Temp:  [98.2 F (36.8 C)-100 F (37.8 C)] 99.4 F (37.4 C) (06/05 1105) Pulse Rate:  [65-91] 75 (06/05 1047) Resp:  [12-29] 12 (06/05 0920) BP: (100-152)/(38-98) 100/59 (06/05 1047) SpO2:  [2 %-100 %] 95 % (06/05 0920) Weight:  [98.9 kg (218 lb)] 98.9 kg (218 lb) (06/05 0738)  Intake/Output from previous day: 06/04 0701 - 06/05 0700 In: 240 [P.O.:240] Out: 500 [Urine:500] Intake/Output this shift: No intake/output data recorded. Nutritional status:  Diet Order           Diet Heart Room service appropriate? Yes; Fluid consistency: Thin  Diet effective now          Neurologic Exam: Mental Status: Alert, oriented, thought content appropriate.  Speech fluent without evidence of aphasia.  Able to follow 3 step commands without difficulty. Cranial Nerves: II: Discs flat bilaterally; RHH, pupils equal, round, reactive to light and accommodation III,IV, VI: ptosis not present, extra-ocular motions intact bilaterally V,VII: right facial droop VIII: hearing normal bilaterally IX,X: gag reflex present XI: bilateral shoulder shrug XII: midline tongue extension Motor: Lifts all extremities against gravity   Lab Results: Basic Metabolic Panel: Recent Labs  Lab 01/26/18 1510 01/28/18 0414 01/29/18 0436  NA 141 138 141  K 3.4* 3.3* 3.9  CL 101 103 107  CO2 27 26 27   GLUCOSE 134* 120* 120*  BUN 32* 19 21*  CREATININE 1.28* 1.00 1.14*  CALCIUM 9.6 9.1 9.1    Liver Function Tests: Recent Labs  Lab 01/26/18 1510  AST 24  ALT 13*  ALKPHOS 84  BILITOT 0.9  PROT 8.2*  ALBUMIN 4.8   No results for input(s): LIPASE, AMYLASE in the last 168 hours. No results for input(s): AMMONIA  in the last 168 hours.  CBC: Recent Labs  Lab 01/26/18 1510 01/31/18 0430  WBC 6.1 8.2  NEUTROABS 4.4  --   HGB 14.4 12.9  HCT 42.4 38.5  MCV 95.1 94.0  PLT 114* 110*    Cardiac Enzymes: Recent Labs  Lab 01/26/18 1510  TROPONINI <0.03    Lipid Panel: Recent Labs  Lab 01/28/18 0414  CHOL 149  TRIG 139  HDL 40*  CHOLHDL 3.7  VLDL 28  LDLCALC 81    CBG: Recent Labs  Lab 01/26/18 1455 01/27/18 2145 01/29/18 0040  GLUCAP 134* 105* 107*    Microbiology: No results found for this or any previous visit.  Coagulation Studies: No results for input(s): LABPROT, INR in the last 72 hours.  Imaging: Ct Angio Head W Or Wo Contrast  Result Date: 01/30/2018 CLINICAL DATA:  Left PCA territory stroke. EXAM: CT ANGIOGRAPHY HEAD AND NECK TECHNIQUE: Multidetector CT imaging of the head and neck was performed using the standard protocol during bolus administration of intravenous contrast. Multiplanar CT image reconstructions and MIPs were obtained to evaluate the vascular anatomy. Carotid stenosis measurements (when applicable) are obtained utilizing NASCET criteria, using the distal internal carotid diameter as the denominator. CONTRAST:  75mL ISOVUE-370 IOPAMIDOL (ISOVUE-370) INJECTION 76% COMPARISON:  MRI same day.  CT yesterday. FINDINGS: CTA NECK FINDINGS Aortic arch: No aortic atherosclerotic changes seen.  Branching pattern of the brachiocephalic vessels is normal with wide patency of the origins. Right carotid system: Common carotid artery widely patent to the bifurcation. Mild atherosclerotic calcification at the carotid bifurcation but no stenosis or irregularity. Cervical ICA is widely patent. Left carotid system: Common carotid artery widely patent to the bifurcation. Minimal atherosclerotic plaque at the carotid bifurcation but no stenosis or irregularity. Cervical ICA is widely patent. Vertebral arteries: Vertebral artery origin detail is limited by chest motion. There is no  calcified plaque. There does appear to be some narrowing at the vertebral artery origins, but this may be artifactual. Beyond the origins, the vessels are widely patent through the cervical region. Right vertebral artery is tortuous in the midportion and both are tortuous at the C3 level. There may be mild fibromuscular change of the distal left vertebral artery but I do not see any flow limiting stenosis or evidence of dissection. Skeleton: Cervicothoracic curvature with cervical spondylosis. Other neck: No mass or lymphadenopathy. Upper chest: Normal Review of the MIP images confirms the above findings CTA HEAD FINDINGS Anterior circulation: Both internal carotid arteries are patent through the skull base and siphon regions. Remarkable lack of siphon atherosclerotic calcification for a person of this age. No stenosis. The anterior and middle cerebral vessels are patent without proximal stenosis, aneurysm or vascular malformation. Dominant A1 segment on the right. Posterior circulation: Both vertebral arteries are patent through the foramen magnum with the left being dominant. No distal vertebral stenosis. Both vertebral arteries contribute to the basilar. The basilar is tortuous but widely patent. Posterior circulation branch vessels are patent proximally, but the left PCA shows occlusion of the major branch vessel at the bifurcation 2-3 cm from its origin. Venous sinuses: Patent and normal. Anatomic variants: None significant. Delayed phase: No abnormal enhancement. Review of the MIP images confirms the above findings IMPRESSION: Relative lack of atherosclerotic disease in general for a patient of this age. The vessels are somewhat tortuous, suggesting a possible history of hypertension. No stenotic disease at the brachiocephalic vessel origins or at either carotid bifurcation. Neither vertebral artery origin is well seen. There is definitely not calcified plaque at either vertebral origin. I suspect that this is  artifactual, secondary to chest breathing motion. Apparent occlusion of a major left PCA branch at the bifurcation 2-3 cm from its origin. This is consistent with embolic occlusion. Electronically Signed   By: Paulina Fusi M.D.   On: 01/30/2018 14:31   Ct Angio Neck W Or Wo Contrast  Result Date: 01/30/2018 CLINICAL DATA:  Left PCA territory stroke. EXAM: CT ANGIOGRAPHY HEAD AND NECK TECHNIQUE: Multidetector CT imaging of the head and neck was performed using the standard protocol during bolus administration of intravenous contrast. Multiplanar CT image reconstructions and MIPs were obtained to evaluate the vascular anatomy. Carotid stenosis measurements (when applicable) are obtained utilizing NASCET criteria, using the distal internal carotid diameter as the denominator. CONTRAST:  75mL ISOVUE-370 IOPAMIDOL (ISOVUE-370) INJECTION 76% COMPARISON:  MRI same day.  CT yesterday. FINDINGS: CTA NECK FINDINGS Aortic arch: No aortic atherosclerotic changes seen. Branching pattern of the brachiocephalic vessels is normal with wide patency of the origins. Right carotid system: Common carotid artery widely patent to the bifurcation. Mild atherosclerotic calcification at the carotid bifurcation but no stenosis or irregularity. Cervical ICA is widely patent. Left carotid system: Common carotid artery widely patent to the bifurcation. Minimal atherosclerotic plaque at the carotid bifurcation but no stenosis or irregularity. Cervical ICA is widely patent. Vertebral arteries: Vertebral artery  origin detail is limited by chest motion. There is no calcified plaque. There does appear to be some narrowing at the vertebral artery origins, but this may be artifactual. Beyond the origins, the vessels are widely patent through the cervical region. Right vertebral artery is tortuous in the midportion and both are tortuous at the C3 level. There may be mild fibromuscular change of the distal left vertebral artery but I do not see any  flow limiting stenosis or evidence of dissection. Skeleton: Cervicothoracic curvature with cervical spondylosis. Other neck: No mass or lymphadenopathy. Upper chest: Normal Review of the MIP images confirms the above findings CTA HEAD FINDINGS Anterior circulation: Both internal carotid arteries are patent through the skull base and siphon regions. Remarkable lack of siphon atherosclerotic calcification for a person of this age. No stenosis. The anterior and middle cerebral vessels are patent without proximal stenosis, aneurysm or vascular malformation. Dominant A1 segment on the right. Posterior circulation: Both vertebral arteries are patent through the foramen magnum with the left being dominant. No distal vertebral stenosis. Both vertebral arteries contribute to the basilar. The basilar is tortuous but widely patent. Posterior circulation branch vessels are patent proximally, but the left PCA shows occlusion of the major branch vessel at the bifurcation 2-3 cm from its origin. Venous sinuses: Patent and normal. Anatomic variants: None significant. Delayed phase: No abnormal enhancement. Review of the MIP images confirms the above findings IMPRESSION: Relative lack of atherosclerotic disease in general for a patient of this age. The vessels are somewhat tortuous, suggesting a possible history of hypertension. No stenotic disease at the brachiocephalic vessel origins or at either carotid bifurcation. Neither vertebral artery origin is well seen. There is definitely not calcified plaque at either vertebral origin. I suspect that this is artifactual, secondary to chest breathing motion. Apparent occlusion of a major left PCA branch at the bifurcation 2-3 cm from its origin. This is consistent with embolic occlusion. Electronically Signed   By: Paulina Fusi M.D.   On: 01/30/2018 14:31   Mr Brain Wo Contrast  Result Date: 01/30/2018 CLINICAL DATA:  Focal neuro deficit, less than 6 hours, stroke suspected. Personal  history of subdural hematoma. New onset facial droop and blurred vision. EXAM: MRI HEAD WITHOUT CONTRAST TECHNIQUE: Multiplanar, multiecho pulse sequences of the brain and surrounding structures were obtained without intravenous contrast. COMPARISON:  CT head without contrast 01/29/2018 and MRI brain 01/26/2018. FINDINGS: Brain: Previously noted infarct has increased in size. Acute nonhemorrhagic infarct involves the mesial left temporal lobe. There is also progressive infarct involving the lateral left thalamus and posterior limb internal capsule. Medial left occipital lobe infarct is now present. Remote lacunar infarcts are present within the left caudate head. No associated hemorrhage is present. There is some effacement of the sulci. Remote right parietal encephalomalacia is again seen. Dilated lateral ventricles are stable. Periaqueductal T2 hyperintensities extend along the aqueduct of Sylvius into the fourth ventricle. White matter changes in the left corona radiata are stable. Remote lacunar infarcts are present within the right cerebellum and bilateral thalami. Vascular: Flow is present in the major intracranial arteries. Skull and upper cervical spine: The craniocervical junction is normal. Upper cervical spine is unremarkable. Marrow signal is normal. Previous right-sided craniotomy is noted. Sinuses/Orbits: Mild mucosal thickening is present inferiorly in the maxillary sinuses bilaterally. The remaining paranasal sinuses and the mastoid air cells are clear. Bilateral lens replacements are present. Globes and orbits are otherwise within normal limits. IMPRESSION: 1. Progressive left PCA territory infarcts involving the  mesial left temporal lobe, medial left occipital lobe, and progressive infarct involving the lateral left thalamus and posterior limb internal capsule. 2. Remote encephalomalacia of the right parietal lobe. 3. Remote lacunar infarcts involving the left caudate head, thalami bilaterally, and  right cerebellum. 4. Moderate atrophy and white matter disease otherwise compatible with chronic microvascular ischemia. Electronically Signed   By: Marin Roberts M.D.   On: 01/30/2018 12:14   Dg Chest Port 1 View  Result Date: 01/31/2018 CLINICAL DATA:  Hypoxia.  Recent CVA. EXAM: PORTABLE CHEST 1 VIEW COMPARISON:  Chest x-ray of June 12, 2017 FINDINGS: There is chronic mild elevation of the left hemidiaphragm. The lungs are reasonably well inflated. The left perihilar lung markings are mildly increased. The cardiopericardial silhouette is mildly enlarged. The pulmonary vascularity is not engorged. The bony thorax exhibits no acute abnormality. IMPRESSION: Subsegmental atelectasis in the left perihilar region. No alveolar pneumonia nor pulmonary edema. Electronically Signed   By: David  [place] M.D.   On: 01/31/2018 15:10    Medications:  I have reviewed the patient's current medications. Scheduled: . aspirin  325 mg Oral Daily  . atorvastatin  80 mg Oral q1800  . cloNIDine  0.3 mg Transdermal Weekly  . clopidogrel  75 mg Oral Daily  . enoxaparin (LOVENOX) injection  40 mg Subcutaneous Q24H  . lisinopril  20 mg Oral Daily   And  . hydrochlorothiazide  25 mg Oral Daily  . latanoprost  1 drop Both Eyes QHS  . levothyroxine  88 mcg Oral QAC breakfast  . mouth rinse  15 mL Mouth Rinse BID  . mirtazapine  30 mg Oral QHS  . potassium chloride  20 mEq Oral Once  . Vilazodone HCl  40 mg Oral BH-q7a    Assessment/Plan: No new neurological complaints.  TEE unremarkable.  Patient remains on ASA and Plavix.    Recommendations: 1. Continue therapy 2. Consider long term cardiac monitoring as an outpatient 3. Follow up with neurology as an outpatient  No further neurologic intervention is recommended at this time.  If further questions arise, please call or page at that time.  Thank you for allowing neurology to participate in the care of this patient.    LOS: 6 days   Thana Farr, MD Neurology [phone] 02/01/2018  11:23 AM

## 2018-02-01 NOTE — Anesthesia Preprocedure Evaluation (Addendum)
Anesthesia Evaluation  Patient identified by MRN, date of birth, ID band Patient awake    Reviewed: Allergy & Precautions, H&P , NPO status , reviewed documented beta blocker date and time   Airway Mallampati: III  TM Distance: >3 FB Neck ROM: limited  Mouth opening: Limited Mouth Opening  Dental  (+) Chipped   Pulmonary           Cardiovascular hypertension, +CHF  Normal cardiovascular exam  ECHO 05/2017  Study Conclusions  - Left ventricle: The cavity size was normal. Systolic function was   normal. The estimated ejection fraction was in the range of 55%   to 60%. Wall motion was normal; there were no regional wall   motion abnormalities. Doppler parameters are consistent with   abnormal left ventricular relaxation (grade 1 diastolic   dysfunction). - Left atrium: The atrium was normal in size. - Right ventricle: Systolic function was normal. - Pulmonary arteries: Systolic pressure was within the normal   range.   Neuro/Psych Seizures -,  PSYCHIATRIC DISORDERS Depression CVA    GI/Hepatic   Endo/Other  Hypothyroidism   Renal/GU Renal disease     Musculoskeletal  (+) Arthritis ,   Abdominal   Peds  Hematology   Anesthesia Other Findings Past Medical History: No date: Cerebral infarction (HCC) No date: CKD (chronic kidney disease), stage III (HCC) No date: Depression No date: DJD (degenerative joint disease) No date: Edema No date: High cholesterol No date: Hyperlipidemia, unspecified No date: Hypertension No date: Hypothyroidism No date: Malaise No date: Morbid obesity (HCC) No date: Osteoarthritis No date: Parathyroid abnormality (HCC) No date: Seizures (HCC)     Comment:  a. following remote stroke. No date: [facility]roke [facility])     Comment:  a. 1964-->residual right arm wkns.  Previously on               coumadin - pt says for just a few yrs. No date: Subdural hematoma (HCC)     Comment:  a. 10/2001  [facility] req temporal frontoparietal craniotomy               following fall. ? whether or not pt on coumadin @ time.                Notes indicate yes but pt denies. No date: Thyroid disease No date: TMJ (dislocation of temporomandibular joint) No date: Tuberculosis     Comment:  a. ~ 1950 No date: Urinary incontinence No date: Weight loss  Past Surgical History: No date: ABDOMINAL HYSTERECTOMY No date: BACK SURGERY No date: BRAIN SURGERY No date: cataract surgery     Comment:  2014 06/13/2017: LEFT HEART CATH AND CORONARY ANGIOGRAPHY; N/A     Comment:  Procedure: LEFT HEART CATH AND CORONARY ANGIOGRAPHY;                Surgeon: Iran Ouch, MD;  Location: [facility] INVASIVE               CV LAB;  Service: Cardiovascular;  Laterality: N/A; No date: PARATHYROID ADENOMA REMOVAL  BMI    Body Mass Index:  35.19 kg/m      Reproductive/Obstetrics                            Anesthesia Physical Anesthesia Plan  ASA: IV  Anesthesia Plan: General   Post-op Pain Management:    Induction:   PONV Risk Score and Plan: 3 and Treatment may  vary due to age or medical condition and TIVA  Airway Management Planned:   Additional Equipment:   Intra-op Plan:   Post-operative Plan:   Informed Consent: I have reviewed the patients History and Physical, chart, labs and discussed the procedure including the risks, benefits and alternatives for the proposed anesthesia with the patient or authorized representative who has indicated his/her understanding and acceptance.   Dental Advisory Given  Plan Discussed with: CRNA  Anesthesia Plan Comments:        Anesthesia Quick Evaluation

## 2018-02-01 NOTE — Progress Notes (Signed)
Pt Vs. Stable. A&O to self. Pt IVs removed. Pt bladder scanned revealing only 59 mL at 1700 since 12:30pm Dr. Nemiah CommanderKalisetti aware. Unable to determine if pt has voided. Report called to RN at [facility]. [facility] to [facility] to [facility]. Daughter and husband aware of discharge.  Kinnie ScalesKim Malaki Koury, LPN

## 2018-02-01 NOTE — Progress Notes (Signed)
*  PRELIMINARY RESULTS* Echocardiogram Echocardiogram Transesophageal has been performed.  Joanette GulaJoan M [name] 02/01/2018, 8:42 AM

## 2018-02-01 NOTE — Progress Notes (Signed)
SLP Cancellation Note  Patient Details Name: Shawna [name] MRN: [id number] DOB: 12/07/1935   Cancelled treatment:       Reason Eval/Treat Not Completed: Patient at procedure or test/unavailable(chart reviewed). Pt is having a TEE this morning. Per MD notes and MR of the Brain, pt has had "Progressive left PCA territory infarcts involving the mesial left temporal lobe, medial left occipital lobe, and progressive infarct involving the lateral left thalamus and posterior limb internal capsule; Moderate atrophy and white matter disease otherwise compatible with chronic microvascular ischemia; Remote encephalomalacia of the right parietal lobe" - described in the MRI on 01/30/2018 from the previous on 01/26/2018.  Due to pt being moderately sedated (per MD note) for this procedure this morning, ST services will hold on any Cognitive tx. Due to pt's presentation during initial assessment on 01/28/2018, recommend f/u skilled ST services upon discharge to address and enhance cognitive-communication skills in ADLs. SW/CM updated.    Jerilynn SomKatherine Watson, MS, CCC-SLP Watson,Katherine 02/01/2018, 9:00 AM

## 2018-02-01 NOTE — Anesthesia Postprocedure Evaluation (Signed)
Anesthesia Post Note  Patient: Shawna [name]  Procedure(s) Performed: TRANSESOPHAGEAL ECHOCARDIOGRAM (TEE) (N/A )  Patient location during evaluation: Cath Lab Anesthesia Type: General Level of consciousness: awake and alert Pain management: pain level controlled Vital Signs Assessment: post-procedure vital signs reviewed and stable Respiratory status: spontaneous breathing, nonlabored ventilation and respiratory function stable Cardiovascular status: blood pressure returned to baseline and stable Postop Assessment: no apparent nausea or vomiting Anesthetic complications: no     Last Vitals:  Vitals:   02/01/18 0900 02/01/18 0920  BP: (!) 136/56 (!) 120/50  Pulse: 78 70  Resp: 15 12  Temp:  37.6 C  SpO2: 94% 95%    Last Pain:  Vitals:   02/01/18 0920  TempSrc: Oral  PainSc:                  Christia ReadingScott T Kaleena Corrow

## 2018-02-01 NOTE — Transfer of Care (Signed)
Immediate Anesthesia Transfer of Care Note  Patient: Shawna [name]  Procedure(s) Performed: TRANSESOPHAGEAL ECHOCARDIOGRAM (TEE) (N/A )  Patient Location: PACU  Anesthesia Type:General  Level of Consciousness: sedated and responds to stimulation  Airway & Oxygen Therapy: Patient Spontanous Breathing and Patient connected to nasal cannula oxygen  Post-op Assessment: Report given to RN and Post -op Vital signs reviewed and stable  Post vital signs: Reviewed and stable  Last Vitals:  Vitals Value Taken Time  BP 117/54 02/01/2018  8:28 AM  Temp    Pulse 82 02/01/2018  8:28 AM  Resp 25 02/01/2018  8:28 AM  SpO2 94 % 02/01/2018  8:28 AM  Vitals shown include unvalidated device data.  Last Pain:  Vitals:   02/01/18 0738  TempSrc: Axillary  PainSc: 0-No pain         Complications: No apparent anesthesia complications

## 2018-02-01 NOTE — Progress Notes (Signed)
Pt Foley cath removed per order pending urination for discharge. Encouraging oral fluids. Kinnie ScalesKim Aloise Copus, LPN

## 2018-02-09 ENCOUNTER — Telehealth: Payer: Self-pay | Admitting: Cardiovascular Disease

## 2018-02-09 NOTE — Telephone Encounter (Signed)
[facility] calling to schedule follow up Patient was seen by Dr. Okey DupreEnd, Dr. Mariah MillingGollan and Flavia Shipper. [place] for a TEE cath procedure Paperwork indicates at 1 week f/u with Dr. Mariah MillingGollan Scheduled for 03/08/18 with Dr. Mariah MillingGollan Please advise or call Marylu LundJanet at [phone]

## 2018-02-09 NOTE — Telephone Encounter (Signed)
Spoke with [place] at the facility and advised that we have her scheduled for 03/08/18 and also on our waiting list if something should open sooner. She verbalized understanding with no further questions.

## 2018-02-12 ENCOUNTER — Emergency Department: Payer: Medicare Other

## 2018-02-12 ENCOUNTER — Other Ambulatory Visit: Payer: Self-pay

## 2018-02-12 ENCOUNTER — Emergency Department
Admission: EM | Admit: 2018-02-12 | Discharge: 2018-02-12 | Disposition: A | Discharge status: Home / Self Care | Payer: Medicare Other | Attending: Emergency Medicine | Admitting: Emergency Medicine

## 2018-02-12 DIAGNOSIS — F22 Delusional disorders: Secondary | ICD-10-CM

## 2018-02-12 DIAGNOSIS — E039 Hypothyroidism, unspecified: Secondary | ICD-10-CM | POA: Insufficient documentation

## 2018-02-12 DIAGNOSIS — I5030 Unspecified diastolic (congestive) heart failure: Secondary | ICD-10-CM | POA: Diagnosis not present

## 2018-02-12 DIAGNOSIS — I130 Hypertensive heart and chronic kidney disease with heart failure and stage 1 through stage 4 chronic kidney disease, or unspecified chronic kidney disease: Secondary | ICD-10-CM | POA: Insufficient documentation

## 2018-02-12 DIAGNOSIS — R4182 Altered mental status, unspecified: Secondary | ICD-10-CM | POA: Insufficient documentation

## 2018-02-12 DIAGNOSIS — F329 Major depressive disorder, single episode, unspecified: Secondary | ICD-10-CM | POA: Insufficient documentation

## 2018-02-12 DIAGNOSIS — E876 Hypokalemia: Secondary | ICD-10-CM | POA: Diagnosis not present

## 2018-02-12 DIAGNOSIS — Z8673 Personal history of transient ischemic attack (TIA), and cerebral infarction without residual deficits: Secondary | ICD-10-CM | POA: Insufficient documentation

## 2018-02-12 DIAGNOSIS — G4751 Confusional arousals: Secondary | ICD-10-CM | POA: Diagnosis present

## 2018-02-12 DIAGNOSIS — N183 Chronic kidney disease, stage 3 (moderate): Secondary | ICD-10-CM | POA: Diagnosis not present

## 2018-02-12 LAB — URINALYSIS, COMPLETE (UACMP) WITH MICROSCOPIC
BILIRUBIN URINE: NEGATIVE
GLUCOSE, UA: NEGATIVE mg/dL
HGB URINE DIPSTICK: NEGATIVE
KETONES UR: NEGATIVE mg/dL
Leukocytes, UA: NEGATIVE
NITRITE: NEGATIVE
PROTEIN: NEGATIVE mg/dL
Specific Gravity, Urine: 1.013 (ref 1.005–1.030)
pH: 6 (ref 5.0–8.0)

## 2018-02-12 LAB — COMPREHENSIVE METABOLIC PANEL
ALBUMIN: 3.7 g/dL (ref 3.5–5.0)
ALT: 21 U/L (ref 14–54)
AST: 28 U/L (ref 15–41)
Alkaline Phosphatase: 73 U/L (ref 38–126)
Anion gap: 8 (ref 5–15)
BILIRUBIN TOTAL: 0.6 mg/dL (ref 0.3–1.2)
BUN: 26 mg/dL — ABNORMAL HIGH (ref 6–20)
CHLORIDE: 101 mmol/L (ref 101–111)
CO2: 31 mmol/L (ref 22–32)
CREATININE: 1.03 mg/dL — AB (ref 0.44–1.00)
Calcium: 9.8 mg/dL (ref 8.9–10.3)
GFR calc Af Amer: 58 mL/min — ABNORMAL LOW (ref >60)
GFR, EST NON AFRICAN AMERICAN: 50 mL/min — AB (ref >60)
GLUCOSE: 141 mg/dL — AB (ref 65–99)
POTASSIUM: 3.1 mmol/L — AB (ref 3.5–5.1)
Sodium: 140 mmol/L (ref 135–145)
Total Protein: 7.3 g/dL (ref 6.5–8.1)

## 2018-02-12 LAB — CBC
HCT: 38.9 % (ref 35.0–47.0)
Hemoglobin: 13.1 g/dL (ref 12.0–16.0)
MCH: 31.8 pg (ref 26.0–34.0)
MCHC: 33.8 g/dL (ref 32.0–36.0)
MCV: 94.1 fL (ref 80.0–100.0)
PLATELETS: 235 10*3/uL (ref 150–440)
RBC: 4.13 MIL/uL (ref 3.80–5.20)
RDW: 13.1 % (ref 11.5–14.5)
WBC: 8.2 10*3/uL (ref 3.6–11.0)

## 2018-02-12 LAB — PROTIME-INR
INR: 1
PROTHROMBIN TIME: 13.1 s (ref 11.4–15.2)

## 2018-02-12 LAB — APTT: aPTT: 30 seconds (ref 24–36)

## 2018-02-12 LAB — GLUCOSE, CAPILLARY: Glucose-Capillary: 144 mg/dL — ABNORMAL HIGH (ref 65–99)

## 2018-02-12 MED ORDER — POTASSIUM CHLORIDE CRYS ER 20 MEQ PO TBCR
40.0000 meq | EXTENDED_RELEASE_TABLET | Freq: Once | ORAL | Status: AC
  Administered 2018-02-12: 40 meq via ORAL

## 2018-02-12 MED ORDER — POTASSIUM CHLORIDE CRYS ER 20 MEQ PO TBCR
EXTENDED_RELEASE_TABLET | ORAL | Status: AC
  Filled 2018-02-12: qty 2

## 2018-02-12 NOTE — ED Triage Notes (Signed)
Pt arrives via ems from [facility], pt was being changed this am and started becoming confused and her eye lids were fluttering according to the staff. Staff concerned that due to her cva hx she maybe having another stroke event

## 2018-02-12 NOTE — Discharge Instructions (Signed)
These continue all of your medications as prescribed.  Please return to the emergency department if you develop severe pain, and changes in mental status, fever, or any other symptoms concerning to you.

## 2018-02-12 NOTE — ED Notes (Signed)
[facility]  [phone]

## 2018-02-12 NOTE — ED Provider Notes (Signed)
[facility] Emergency Department Provider Note  ____________________________________________  Time seen: Approximately 10:15 AM  I have reviewed the triage vital signs and the nursing notes.   HISTORY  Chief Complaint Extremity Weakness    HPI Shawna [name] is a 81 y.o. female a history of CVA on aspirin and Plavix, remote seizures w/ prior CVA (not on AED's), HTN, HL brought by EMS from [facility] for acute confusion.  The patient is unable to give any history due to altered mental status.  EMS reports that the patient was being changed in her bed this morning when her eyes "rolled back" her eyelids flutter, and she became acutely confused.  There is no report of tonic-clonic activity although my exam is limited as the staff who witnessed this is not with the patient.  I am unable to obtain any additional history but the patient denies any pain at this time.   Past Medical History:  Diagnosis Date  . Cerebral infarction (HCC)   . CKD (chronic kidney disease), stage III (HCC)   . Depression   . DJD (degenerative joint disease)   . Edema   . High cholesterol   . Hyperlipidemia, unspecified   . Hypertension   . Hypothyroidism   . Malaise   . Morbid obesity (HCC)   . Osteoarthritis   . Parathyroid abnormality (HCC)   . Seizures (HCC)    a. following remote stroke.  . Stroke [facility])    a. 1964-->residual right arm wkns.  Previously on coumadin - pt says for just a few yrs.  . Subdural hematoma (HCC)    a. 10/2001 [facility] req temporal frontoparietal craniotomy following fall. ? whether or not pt on coumadin @ time.  Notes indicate yes but pt denies.  . Thyroid disease   . TMJ (dislocation of temporomandibular joint)   . Tuberculosis    a. ~ 1950  . Urinary incontinence   . Weight loss     Patient Active Problem List   Diagnosis Date Noted  . Stroke (HCC) 01/26/2018  . Diastolic congestive heart failure (HCC) 07/28/2017  . Troponin I above  reference range 06/15/2017  . Acute CVA (cerebrovascular accident) (HCC) 06/15/2017  . Otalgia of both ears 06/08/2017  . Right leg pain 06/08/2017  . Dizzy 06/08/2017    Past Surgical History:  Procedure Laterality Date  . ABDOMINAL HYSTERECTOMY    . BACK SURGERY    . BRAIN SURGERY    . cataract surgery     2014  . LEFT HEART CATH AND CORONARY ANGIOGRAPHY N/A 06/13/2017   Procedure: LEFT HEART CATH AND CORONARY ANGIOGRAPHY;  Surgeon: Iran Ouch, MD;  Location: [facility] INVASIVE CV LAB;  Service: Cardiovascular;  Laterality: N/A;  . PARATHYROID ADENOMA REMOVAL    . TEE WITHOUT CARDIOVERSION N/A 01/31/2018   Procedure: TRANSESOPHAGEAL ECHOCARDIOGRAM (TEE);  Surgeon: Yvonne Kendall, MD;  Location: [facility] ORS;  Service: Cardiovascular;  Laterality: N/A;  . TEE WITHOUT CARDIOVERSION N/A 02/01/2018   Procedure: TRANSESOPHAGEAL ECHOCARDIOGRAM (TEE);  Surgeon: Antonieta Iba, MD;  Location: [facility] ORS;  Service: Cardiovascular;  Laterality: N/A;    Current Outpatient Rx  . Order #: [id number] Class: Historical Med  . Order #: [id number] Class: Print  . Order #: [id number] Class: Print  . Order #: [id number] Class: Historical Med  . Order #: [id number] Class: Print  . Order #: [id number] Class: No Print  . Order #: [id number] Class: Print  . Order #: [id number] Class: Historical Med  . Order #: [id number] Class: Historical Med  . Order #:  [id number] Class: Historical Med  . Order #: [id number] Class: Historical Med  . Order #: [id number] Class: No Print  . Order #: [id number] Class: Print  . Order #: [id number] Class: Historical Med    Allergies Patient has no known allergies.  Family History  Problem Relation Age of Onset  . Heart failure Mother        died @ 46  . Heart attack Father        died @ 33  . Breast cancer Neg Hx     Social History Social History   Tobacco Use  . Smoking status: Never Smoker  . Smokeless tobacco: Never Used  Substance Use Topics  . Alcohol use: No  . Drug use: No     Review of Systems Unable to obtain due to patient mental status.  ____________________________________________   PHYSICAL EXAM:  VITAL SIGNS: ED Triage Vitals  Enc Vitals Group     BP      Pulse      Resp      Temp      Temp src      SpO2      Weight      Height      Head Circumference      [facility] Flow      Pain Score      Pain Loc      Pain Edu?      Excl. in GC?     Constitutional: The patient is chronically ill-appearing appearing but nontoxic.  She is alert but unable to answer most questions due to altered mental status.   Eyes: Conjunctivae are normal.  EOMI. pupils are equal but sluggish bilaterally.  No scleral icterus. Head: Atraumatic.   Nose: No congestion/rhinnorhea.  Mouth/Throat: Mucous membranes are moist.  Neck: No stridor.  Supple.  No meningismus. Cardiovascular: Normal rate, regular rhythm. No murmurs, rubs or gallops.  Respiratory: Normal respiratory effort.  No accessory muscle use or retractions. Lungs CTAB.  No wheezes, rales or ronchi. Gastrointestinal: Obese.  Soft, nontender and mildly distended.  No guarding or rebound.  No peritoneal signs. Musculoskeletal: No LE edema.  Neurologic: Alert and oriented 1. Speech is clear.  Left facial paralysis with asymmetric smile and left tongue deviation.  Pupils are equal and sluggish bilaterally.  EOMI.. 5 out of 5 grip, biceps, triceps, left hip flexors, plantar flexion and dorsiflexion.  4+ out of 5 right hip flexor.  Normal sensation to light touch in the bilateral upper and lower extremities, but decreased sensation on the left side of the face.  She is unable to participate in cerebellar testing. Skin:  Skin is warm, dry and intact. No rash noted. Psychiatric: Mood and affect are normal.   ____________________________________________   LABS (all labs ordered are listed, but only abnormal results are displayed)  Labs Reviewed  GLUCOSE, CAPILLARY - Abnormal; Notable for the following components:       Result Value   Glucose-Capillary 144 (*)    All other components within normal limits  COMPREHENSIVE METABOLIC PANEL - Abnormal; Notable for the following components:   Potassium 3.1 (*)    Glucose, Bld 141 (*)    BUN 26 (*)    Creatinine, Ser 1.03 (*)    GFR calc non Af Amer 50 (*)    GFR calc Af Amer 58 (*)    All other components within normal limits  URINALYSIS, COMPLETE (UACMP) WITH MICROSCOPIC - Abnormal; Notable for the following components:   Color, Urine YELLOW (*)  APPearance CLEAR (*)    Bacteria, UA RARE (*)    All other components within normal limits  URINE CULTURE  PROTIME-INR  APTT  CBC   ____________________________________________  EKG  ED ECG REPORT I, Rockne MenghiniNorman, Anne-Caroline, the attending physician, personally viewed and interpreted this ECG.   Date: 02/12/2018  EKG Time: 1004  Rate: 74  Rhythm: normal sinus rhythm  Axis: leftward  Intervals:none  ST&T Change: No STEMI  This EKG is compared to 02/02/2018 and is grossly unchanged. ____________________________________________  RADIOLOGY  Dg Chest 2 View  Result Date: 02/12/2018 CLINICAL DATA:  Altered mental status. EXAM: CHEST - 2 VIEW COMPARISON:  01/31/2018 FINDINGS: Normal heart size. No pleural effusions identified. Chronic asymmetric elevation of left hemidiaphragm with overlying parenchymal scarring. Unchanged. IMPRESSION: No active cardiopulmonary disease. Electronically Signed   By: Signa Kellaylor  Stroud M.D.   On: 02/12/2018 10:38   Ct Head Wo Contrast  Result Date: 02/12/2018 CLINICAL DATA:  Recent stroke.  Neuro deficit. EXAM: CT HEAD WITHOUT CONTRAST TECHNIQUE: Contiguous axial images were obtained from the base of the skull through the vertex without intravenous contrast. COMPARISON:  MRI head 01/30/2018, CT head 01/29/2018 FINDINGS: Brain: Hypodensity left occipital lobe and left posteromedial temporal lobe compatible with recent infarction as noted on MRI. No extension of infarction Right  craniectomy and cranioplasty. Encephalomalacia in the high right frontal parietal lobe and right temporal lobe is chronic and unchanged. Chronic infarct in the left frontal periventricular white matter unchanged. Generalized atrophy with ventricular enlargement unchanged Negative for hemorrhage or mass Vascular: Negative for hyperdense vessel Skull: Right-sided craniectomy with cranioplasty Sinuses/Orbits: Mild mucosal edema paranasal sinuses. Bilateral cataract surgery Other: None IMPRESSION: Acute/subacute left posterior cerebral artery infarct as noted on recent MRI. No extension or hemorrhage. Electronically Signed   By: Marlan Palauharles  Clark M.D.   On: 02/12/2018 11:00    ____________________________________________   PROCEDURES  Procedure(s) performed: None  Procedures  Critical Care performed: No ____________________________________________   INITIAL IMPRESSION / ASSESSMENT AND PLAN / ED COURSE  Pertinent labs & imaging results that were available during my care of the patient were reviewed by me and considered in my medical decision making (see chart for details).  82 y.o. female with a history of multiple prior CVAs, seizures, presenting for acute episodes of eyes rolling back, eye fluttering, and altered mental status.  Overall, the patient is hemodynamically stable.  She has multiple neurologic abnormalities on my examination, and after spoken with her husband who will come to the room to let me know if there are any new changes from her baseline.  In the meantime, will get electrolytes, urinalysis, CT of the head, chest x-ray, EKG.  Will check for stroke, consider seizures, consider infection.  Plan reevaluation for final disposition.  Reviewed the patient's medical chart, including her most recent admission for stroke in early June; at that time, the decision was made to continue aspirin and add Plavix for medical optimization.  ----------------------------------------- 12:01 PM on  02/12/2018 -----------------------------------------  The patient's work-up in the emergency is reassuring although I am still pending the urine.  She has some mild hypokalemia but it is unlikely that that is the cause of her symptoms today.  Her glucose is normal.  Her CT head shows the acute and subacute left posterior cerebral artery infarct that was seen during her last hospitalization in the first part of May when she was admitted for acute strokes.  I have spoken to the patient's family, including her husband and daughter who  are the primary decision makers for the patient.  They report with the described as "mood swings," that started at the patient was placed at [facility].  What they describe is her mitten episodes of paranoia and complaining about being uncomfortable.  We have discussed multiple possible scenarios and pathways.  At this time, they are speaking is a family to make a decision for final disposition.  The patient continues to be hemodynamically stable without any acute findings.  ----------------------------------------- 1:18 PM on 02/12/2018 -----------------------------------------  The patient's work-up in the emergency department has not yielded any findings for her changes in mental status.  These may be related to her recent strokes, as well as being placed in a new facility.  Her urinalysis does not show UTI.  She does have some rare bacteria, so I have sent her urine for culture and the family will follow-up with her primary care physician in 2 to 3 days to follow-up the culture.  At this time, the patient will be discharged back to [facility] and return precautions were discussed.  ____________________________________________  FINAL CLINICAL IMPRESSION(S) / ED DIAGNOSES  Final diagnoses:  Altered mental status, unspecified altered mental status type  Paranoia (HCC)  Hypokalemia         NEW MEDICATIONS STARTED DURING THIS VISIT:  New Prescriptions    No medications on file      Rockne Menghini, MD 02/12/18 1319

## 2018-02-14 LAB — URINE CULTURE: Culture: 50000 — AB

## 2018-02-16 NOTE — Telephone Encounter (Signed)
Patient daughter calling to confirm appointment on 03/08/18 States if there are any cancellations she would like to be called at [phone] Patient verbalized understanding

## 2018-02-22 ENCOUNTER — Encounter: Payer: Self-pay | Admitting: Diagnostic Neuroimaging

## 2018-02-22 ENCOUNTER — Ambulatory Visit: Payer: Medicare Other | Admitting: Diagnostic Neuroimaging

## 2018-02-22 VITALS — BP 130/80 | HR 82 | Ht 65.0 in

## 2018-02-22 DIAGNOSIS — I63432 Cerebral infarction due to embolism of left posterior cerebral artery: Secondary | ICD-10-CM | POA: Diagnosis not present

## 2018-02-22 NOTE — Progress Notes (Addendum)
[facility]  PATIENT: Shawna [name] DOB: 01/06/1936  REFERRING CLINICIAN: R Kalisetti HISTORY FROM: patient  REASON FOR VISIT: new consult    HISTORICAL  CHIEF COMPLAINT:  Chief Complaint  Patient presents with  . Cerebrovascular Accident    rm 6, New Pt, husband- Fayrene Fearing, dgtr- Jacqueline, [facility], [place]  . Seizures    "2 strokes between May -June 3rd, memory loss, episodes of confusion, blurred vision"    HISTORY OF PRESENT ILLNESS:   82 year old female here for evaluation of stroke.  Patient was admitted to the hospital in May 2019 due to facial weakness, vision changes and gait difficulty.  Patient was found to have acute left thalamic ischemic infarction.  Stroke work-up was completed.  Patient currently undergoing rehabilitation.  Patient tolerating medications.  Patient also presented to emergency room on 02/12/2018 due to confusion, eye fluttering, altered mental status.  Patient had paranoia and mood swings.  ER evaluation was otherwise unremarkable.  No recurrent symptoms since that time.  Patient has history of stroke and subdural hematoma in 2002.  This required craniotomy.  Patient has history of stroke in October 2018 with bilateral brain infarcts.    REVIEW OF SYSTEMS: Full 14 system review of systems performed and negative with exception of: Blurred vision memory loss confusion insomnia.  Hypertension hypercholesterolemia depression.  ALLERGIES: No Known Allergies  HOME MEDICATIONS: Outpatient Medications Prior to Visit  Medication Sig Dispense Refill  . acetaminophen (TYLENOL) 325 MG tablet Take 650 mg by mouth every 4 (four) hours as needed for mild pain or moderate pain.     Marland Kitchen aspirin 325 MG tablet Take 1 tablet (325 mg total) by mouth daily. 30 tablet 2  . atorvastatin (LIPITOR) 80 MG tablet Take 1 tablet (80 mg total) by mouth daily at 6 PM. 30 tablet 2  . cloNIDine (CATAPRES - DOSED IN MG/24 HR) 0.3 mg/24hr  patch Place 1 patch onto the skin every Friday.   3  . clopidogrel (PLAVIX) 75 MG tablet Take 1 tablet (75 mg total) by mouth daily. 30 tablet 2  . furosemide (LASIX) 20 MG tablet Take 1 tablet (20 mg total) by mouth daily as needed for fluid or edema. 30 tablet   . KLOR-CON M20 20 MEQ tablet Take 1 tablet (20 mEq total) by mouth daily as needed. While on lasix 30 tablet 2  . latanoprost (XALATAN) 0.005 % ophthalmic solution Place 1 drop into both eyes at bedtime.   3  . levothyroxine (SYNTHROID, LEVOTHROID) 88 MCG tablet Take 1 tablet by mouth daily.   1  . lisinopril-hydrochlorothiazide (PRINZIDE,ZESTORETIC) 20-25 MG tablet Take 1 tablet by mouth daily.    Marland Kitchen LORazepam (ATIVAN) 0.5 MG tablet Take 0.5 mg by mouth every 8 (eight) hours.    . mirtazapine (REMERON) 45 MG tablet Take 45 mg by mouth at bedtime.  4  . polyethylene glycol (MIRALAX / GLYCOLAX) packet Take 17 g by mouth daily as needed for mild constipation. 14 each 0  . tamsulosin (FLOMAX) 0.4 MG CAPS capsule Take 1 capsule (0.4 mg total) by mouth daily. 30 capsule 1  . VIIBRYD 40 MG TABS Take 40 mg by mouth every morning.  1   No facility-administered medications prior to visit.     PAST MEDICAL HISTORY: Past Medical History:  Diagnosis Date  . Cerebral infarction (HCC)   . CKD (chronic kidney disease), stage III (HCC)   . Depression   . DJD (degenerative joint disease)   . Edema   .  High cholesterol   . Hyperlipidemia, unspecified   . Hypertension   . Hypothyroidism   . Malaise   . Morbid obesity (HCC)   . Osteoarthritis   . Parathyroid abnormality (HCC)   . Seizures (HCC)    a. following remote stroke.  . Stroke (HCC) 2002, 05/2017, 01/26/18, 01/30/18   a. 1964-->residual right arm wkns.  Previously on coumadin - pt says for just a few yrs.  . Subdural hematoma (HCC)    a. 10/2001 [facility] req temporal frontoparietal craniotomy following fall. ? whether or not pt on coumadin @ time.  Notes indicate yes but pt denies.  .  Thyroid disease   . TMJ (dislocation of temporomandibular joint)   . Tuberculosis    a. ~ 1950  . Urinary incontinence   . Weight loss     PAST SURGICAL HISTORY: Past Surgical History:  Procedure Laterality Date  . ABDOMINAL HYSTERECTOMY    . BACK SURGERY    . BRAIN SURGERY    . cataract surgery     2014  . LEFT HEART CATH AND CORONARY ANGIOGRAPHY N/A 06/13/2017   Procedure: LEFT HEART CATH AND CORONARY ANGIOGRAPHY;  Surgeon: Iran OuchArida, Muhammad A, MD;  Location: [facility] INVASIVE CV LAB;  Service: Cardiovascular;  Laterality: N/A;  . PARATHYROID ADENOMA REMOVAL    . TEE WITHOUT CARDIOVERSION N/A 01/31/2018   Procedure: TRANSESOPHAGEAL ECHOCARDIOGRAM (TEE);  Surgeon: Yvonne KendallEnd, Christopher, MD;  Location: [facility] ORS;  Service: Cardiovascular;  Laterality: N/A;  . TEE WITHOUT CARDIOVERSION N/A 02/01/2018   Procedure: TRANSESOPHAGEAL ECHOCARDIOGRAM (TEE);  Surgeon: Antonieta IbaGollan, Timothy J, MD;  Location: [facility] ORS;  Service: Cardiovascular;  Laterality: N/A;    FAMILY HISTORY: Family History  Problem Relation Age of Onset  . Heart failure Mother        died @ 5688  . Heart attack Father        died @ 6988  . Stroke Father   . Breast cancer Neg Hx     SOCIAL HISTORY:  Social History   Socioeconomic History  . Marital status: Married    Spouse name: Fayrene FearingJames  . Number of children: 2  . Years of education: BS  . Highest education level: Not on file  Occupational History  . Not on file  Social Needs  . Financial resource strain: Not on file  . Food insecurity:    Worry: Not on file    Inability: Not on file  . Transportation needs:    Medical: Not on file    Non-medical: Not on file  Tobacco Use  . Smoking status: Never Smoker  . Smokeless tobacco: Never Used  Substance and Sexual Activity  . Alcohol use: No  . Drug use: No  . Sexual activity: Not on file  Lifestyle  . Physical activity:    Days per week: Not on file    Minutes per session: Not on file  . Stress: Not on file    Relationships  . Social connections:    Talks on phone: Not on file    Gets together: Not on file    Attends religious service: Not on file    Active member of club or organization: Not on file    Attends meetings of clubs or organizations: Not on file    Relationship status: Not on file  . Intimate partner violence:    Fear of current or ex partner: Not on file    Emotionally abused: Not on file    Physically abused: Not on file  Forced sexual activity: Not on file  Other Topics Concern  . Not on file  Social History Narrative   02/22/18 residing at [facility] in [place].  Active around the house.  Does not routinely exercise or drive.  Does own grocery shopping.     PHYSICAL EXAM  GENERAL EXAM/CONSTITUTIONAL: Vitals:  Vitals:   02/22/18 1404  BP: 130/80  Pulse: 82  Height: 5\' 5"  (1.651 m)     Body mass index is 37.09 kg/m.  No exam data present  Patient is in no distress; well developed, nourished and groomed; neck is supple  CARDIOVASCULAR:  Examination of carotid arteries is normal; no carotid bruits  Regular rate and rhythm, no murmurs  Examination of peripheral vascular system by observation and palpation is normal  EYES:  Ophthalmoscopic exam of optic discs and posterior segments is normal; no papilledema or hemorrhages  MUSCULOSKELETAL:  Gait, strength, tone, movements noted in Neurologic exam below  NEUROLOGIC: MENTAL STATUS:  No flowsheet data found.  awake, alert, oriented to person, place and time  recent and remote memory intact  normal attention and concentration  language fluent, comprehension intact, naming intact,   fund of knowledge appropriate  CRANIAL NERVE:   2nd - no papilledema on fundoscopic exam  2nd, 3rd, 4th, 6th - pupils --> RIGHT MIN REACTION; LEFT MIN REACTION; EXOTROPIA; DECR RIGHT VISUAL FIELD; DIFF IN COUNTING FINGERS  5th - facial  sensation symmetric  7th - facial strength --> DECR LEFT EYE BROW RAISE  8th - hearing intact  9th - palate elevates symmetrically, uvula midline  11th - shoulder shrug symmetric  12th - tongue protrusion midline  MILD DYSARTHRIA  MOTOR:   INCREASED TONE IN LUE AND LLE  SENSORY:   normal and symmetric to light touch  COORDINATION:   finger-nose-finger, fine finger movements SLOW  REFLEXES:   deep tendon reflexes TRACE and symmetric  GAIT/STATION:   IN WHEELCHAIR    DIAGNOSTIC DATA (LABS, IMAGING, TESTING) - I reviewed patient records, labs, notes, testing and imaging myself where available.  Lab Results  Component Value Date   WBC 8.2 02/12/2018   HGB 13.1 02/12/2018   HCT 38.9 02/12/2018   MCV 94.1 02/12/2018   PLT 235 02/12/2018      Component Value Date/Time   NA 140 02/12/2018 1019   K 3.1 (L) 02/12/2018 1019   CL 101 02/12/2018 1019   CO2 31 02/12/2018 1019   GLUCOSE 141 (H) 02/12/2018 1019   BUN 26 (H) 02/12/2018 1019   CREATININE 1.03 (H) 02/12/2018 1019   CALCIUM 9.8 02/12/2018 1019   PROT 7.3 02/12/2018 1019   ALBUMIN 3.7 02/12/2018 1019   AST 28 02/12/2018 1019   ALT 21 02/12/2018 1019   ALKPHOS 73 02/12/2018 1019   BILITOT 0.6 02/12/2018 1019   GFRNONAA 50 (L) 02/12/2018 1019   GFRAA 58 (L) 02/12/2018 1019   Lab Results  Component Value Date   CHOL 149 01/28/2018   HDL 40 (L) 01/28/2018   LDLCALC 81 01/28/2018   TRIG 139 01/28/2018   CHOLHDL 3.7 01/28/2018   Lab Results  Component Value Date   HGBA1C 5.6 01/27/2018   No results found for: VITAMINB12 No results found for: TSH   01/26/18 MRI brain 1. Foci of acute/early subacute infarction within the left lateral thalamus and the left mid hippocampus. No associated hemorrhage or mass effect. 2. Stable background of chronic infarctions, microvascular  ischemic changes, and parenchymal volume loss of the brain. 3. Stable postsurgical changes related to a right  convexity cranioplasty.  01/30/18 MRI brain  1. Progressive left PCA territory infarcts involving the mesial left temporal lobe, medial left occipital lobe, and progressive infarct involving the lateral left thalamus and posterior limb internal capsule. 2. Remote encephalomalacia of the right parietal lobe. 3. Remote lacunar infarcts involving the left caudate head, thalami bilaterally, and right cerebellum. 4. Moderate atrophy and white matter disease otherwise compatible with chronic microvascular ischemia.  01/30/18 CTA head / neck - Relative lack of atherosclerotic disease in general for a patient of this age. The vessels are somewhat tortuous, suggesting a possible history of hypertension. No stenotic disease at the brachiocephalic vessel origins or at either carotid bifurcation. Neither vertebral artery origin is well seen. There is definitely not calcified plaque at either vertebral origin. I suspect that this is artifactual, secondary to chest breathing motion. - Apparent occlusion of a major left PCA branch at the bifurcation 2-3 cm from its origin. This is consistent with embolic occlusion.  02/01/18 TEE - Left ventricle: Systolic function was normal. The estimated   ejection fraction was in the range of 55% to 60%. Wall motion was   normal; there were no regional wall motion abnormalities. - Left atrium: The atrium was normal in size. No evidence of   thrombus in the atrial cavity or appendage. No evidence of   thrombus in the atrial cavity or appendage. - Right ventricle: Systolic function was normal. - Right atrium: No evidence of thrombus in the atrial cavity or   appendage. - Atrial septum: No defect or patent foramen ovale was identified.   Echo contrast study showed no right-to-left atrial level shunt,   at baseline or with provocation.  01/26/18 carotid u/s - Minor carotid atherosclerosis and tortuosity. No hemodynamically significant ICA stenosis. Degree of narrowing  less than 50% bilaterally by ultrasound criteria. - Patent antegrade vertebral flow bilaterally     ASSESSMENT AND PLAN  82 y.o. year old female here with embolic strokes in May 2019 here for evaluation.   Dx: embolic strokes + h/o right brain subdural hematoma (2002)  1. Cerebrovascular accident (CVA) due to embolism of left posterior cerebral artery (HCC)      PLAN:  STROKE PREVENTION - follow up with cardiology re: implanted loop recorder to monitor for atrial fibrillation (due to embolic strokes in the brain) - otherwise, continue aspirin, plavix, BP control, statin for medical risk factor mgmt  Return if symptoms worsen or fail to improve, for return to PCP.    Suanne Marker, MD 02/22/2018, 2:21 PM Certified in Neurology, Neurophysiology and Neuroimaging  [facility] [address], [address] [place], [place] [address] [phone]

## 2018-02-22 NOTE — Patient Instructions (Signed)
-   follow up with cardiology re: implanted loop recorder to monitor for atrial fibrillation (due to embolic strokes in the brain) - otherwise, continue aspirin, plavix, BP control, statin

## 2018-02-23 ENCOUNTER — Encounter
Admission: RE | Admit: 2018-02-23 | Discharge: 2018-02-23 | Disposition: A | Discharge status: Home / Self Care | Payer: Medicare Other | Source: Ambulatory Visit | Attending: Internal Medicine | Admitting: Internal Medicine

## 2018-02-24 ENCOUNTER — Other Ambulatory Visit: Payer: Self-pay

## 2018-02-24 MED ORDER — LORAZEPAM 0.5 MG PO TABS: 0.5000 mg | ORAL_TABLET | Freq: Four times a day (QID) | ORAL | 0 refills | Status: DC | PRN

## 2018-02-24 NOTE — Telephone Encounter (Signed)
Rx sent to [facility] phone : [phone] , fax : [phone]  

## 2018-02-27 ENCOUNTER — Encounter
Admission: RE | Admit: 2018-02-27 | Discharge: 2018-02-27 | Disposition: A | Discharge status: Home / Self Care | Payer: Medicare Other | Source: Ambulatory Visit | Attending: Internal Medicine | Admitting: Internal Medicine

## 2018-03-03 ENCOUNTER — Encounter: Payer: Self-pay | Admitting: Adult Health

## 2018-03-03 ENCOUNTER — Non-Acute Institutional Stay (SKILLED_NURSING_FACILITY): Payer: Medicare Other | Admitting: Adult Health

## 2018-03-03 ENCOUNTER — Other Ambulatory Visit: Payer: Self-pay

## 2018-03-03 DIAGNOSIS — H409 Unspecified glaucoma: Secondary | ICD-10-CM | POA: Diagnosis not present

## 2018-03-03 DIAGNOSIS — F418 Other specified anxiety disorders: Secondary | ICD-10-CM

## 2018-03-03 DIAGNOSIS — I110 Hypertensive heart disease with heart failure: Secondary | ICD-10-CM

## 2018-03-03 DIAGNOSIS — I639 Cerebral infarction, unspecified: Secondary | ICD-10-CM

## 2018-03-03 DIAGNOSIS — R338 Other retention of urine: Secondary | ICD-10-CM

## 2018-03-03 DIAGNOSIS — E785 Hyperlipidemia, unspecified: Secondary | ICD-10-CM

## 2018-03-03 DIAGNOSIS — N183 Chronic kidney disease, stage 3 unspecified: Secondary | ICD-10-CM

## 2018-03-03 DIAGNOSIS — E034 Atrophy of thyroid (acquired): Secondary | ICD-10-CM

## 2018-03-03 DIAGNOSIS — I5032 Chronic diastolic (congestive) heart failure: Secondary | ICD-10-CM

## 2018-03-03 MED ORDER — LORAZEPAM 0.5 MG PO TABS: 0.5000 mg | ORAL_TABLET | Freq: Two times a day (BID) | ORAL | 0 refills | Status: DC

## 2018-03-03 NOTE — Telephone Encounter (Signed)
Rx sent to [facility] phone : [phone] , fax : [phone]  

## 2018-03-03 NOTE — Progress Notes (Signed)
Location:   [facility] Room Number: 310B Place of Service:  SNF (31)   CODE STATUS: FULL  No Known Allergies  Chief Complaint  Patient presents with  . Medical Management of Chronic Issues    Cva; heart failure hypertension. Weekly follow up for the first 30 days post hospitalization     HPI:  She is a 82 year old long term resident of this facility being seen for the management of her chronic illnesses: hypertension ;cva; heart failure. Staff reports that she is getting anxious and agitated in the afternoons and suggest her getting ativan in the afternoon as well as in the AM. She is unable to participate in the hpi or ros. There are no reports of uncontrolled pain or changes in appetite.   Past Medical History:  Diagnosis Date  . Cerebral infarction (HCC)   . CKD (chronic kidney disease), stage III (HCC)   . Depression   . DJD (degenerative joint disease)   . Edema   . High cholesterol   . Hyperlipidemia, unspecified   . Hypertension   . Hypothyroidism   . Malaise   . Morbid obesity (HCC)   . Osteoarthritis   . Parathyroid abnormality (HCC)   . Seizures (HCC)    a. following remote stroke.  . Stroke (HCC) 2002, 05/2017, 01/26/18, 01/30/18   a. 1964-->residual right arm wkns.  Previously on coumadin - pt says for just a few yrs.  . Subdural hematoma (HCC)    a. 10/2001 [facility] req temporal frontoparietal craniotomy following fall. ? whether or not pt on coumadin @ time.  Notes indicate yes but pt denies.  . Thyroid disease   . TMJ (dislocation of temporomandibular joint)   . Tuberculosis    a. ~ 1950  . Urinary incontinence   . Weight loss     Past Surgical History:  Procedure Laterality Date  . ABDOMINAL HYSTERECTOMY    . BACK SURGERY    . BRAIN SURGERY    . cataract surgery     2014  . LEFT HEART CATH AND CORONARY ANGIOGRAPHY N/A 06/13/2017   Procedure: LEFT HEART CATH AND CORONARY ANGIOGRAPHY;  Surgeon: Iran OuchArida, Muhammad A, MD;  Location:  [facility] INVASIVE CV LAB;  Service: Cardiovascular;  Laterality: N/A;  . PARATHYROID ADENOMA REMOVAL    . TEE WITHOUT CARDIOVERSION N/A 01/31/2018   Procedure: TRANSESOPHAGEAL ECHOCARDIOGRAM (TEE);  Surgeon: Yvonne KendallEnd, Christopher, MD;  Location: [facility] ORS;  Service: Cardiovascular;  Laterality: N/A;  . TEE WITHOUT CARDIOVERSION N/A 02/01/2018   Procedure: TRANSESOPHAGEAL ECHOCARDIOGRAM (TEE);  Surgeon: Antonieta IbaGollan, Timothy J, MD;  Location: [facility] ORS;  Service: Cardiovascular;  Laterality: N/A;    Social History   Socioeconomic History  . Marital status: Married    Spouse name: Fayrene FearingJames  . Number of children: 2  . Years of education: BS  . Highest education level: Not on file  Occupational History  . Not on file  Social Needs  . Financial resource strain: Not on file  . Food insecurity:    Worry: Not on file    Inability: Not on file  . Transportation needs:    Medical: Not on file    Non-medical: Not on file  Tobacco Use  . Smoking status: Never Smoker  . Smokeless tobacco: Never Used  Substance and Sexual Activity  . Alcohol use: No  . Drug use: No  . Sexual activity: Not on file  Lifestyle  . Physical activity:    Days per week: Not  on file    Minutes per session: Not on file  . Stress: Not on file  Relationships  . Social connections:    Talks on phone: Not on file    Gets together: Not on file    Attends religious service: Not on file    Active member of club or organization: Not on file    Attends meetings of clubs or organizations: Not on file    Relationship status: Not on file  . Intimate partner violence:    Fear of current or ex partner: Not on file    Emotionally abused: Not on file    Physically abused: Not on file    Forced sexual activity: Not on file  Other Topics Concern  . Not on file  Social History Narrative   02/22/18 residing at [facility] in [place] - [place].  Active around the house.  Does not routinely  exercise or drive.  Does own grocery shopping.   Family History  Problem Relation Age of Onset  . Heart failure Mother        died @ 12  . Heart attack Father        died @ 41  . Stroke Father   . Breast cancer Neg Hx       VITAL SIGNS BP 128/69   Pulse 78   Temp 98.5 F (36.9 C) (Oral)   Resp (!) 24   Ht 5\' 5"  (1.651 m)   Wt 222 lb 14.2 oz (101.1 kg)   SpO2 95%   BMI 37.09 kg/m   Outpatient Encounter Medications as of 03/03/2018  Medication Sig  . acetaminophen (TYLENOL) 325 MG tablet Take 650 mg by mouth every 4 (four) hours as needed for mild pain or moderate pain.   Marland Kitchen aspirin 325 MG tablet Take 1 tablet (325 mg total) by mouth daily.  Marland Kitchen atorvastatin (LIPITOR) 80 MG tablet Take 1 tablet (80 mg total) by mouth daily at 6 PM.  . cloNIDine (CATAPRES - DOSED IN MG/24 HR) 0.3 mg/24hr patch Place 1 patch onto the skin every Friday.   . clopidogrel (PLAVIX) 75 MG tablet Take 1 tablet (75 mg total) by mouth daily.  . furosemide (LASIX) 20 MG tablet Take 1 tablet (20 mg total) by mouth daily as needed for fluid or edema.  Marland Kitchen KLOR-CON M20 20 MEQ tablet Take 1 tablet (20 mEq total) by mouth daily as needed. While on lasix  . latanoprost (XALATAN) 0.005 % ophthalmic solution Place 1 drop into both eyes at bedtime.   Marland Kitchen levothyroxine (SYNTHROID, LEVOTHROID) 88 MCG tablet Take 1 tablet by mouth daily.   Marland Kitchen lisinopril-hydrochlorothiazide (PRINZIDE,ZESTORETIC) 20-25 MG tablet Take 1 tablet by mouth daily.  Marland Kitchen LORazepam (ATIVAN) 0.5 MG tablet Take 1 tablet (0.5 mg total) by mouth  In morning   . mirtazapine (REMERON) 45 MG tablet Take 45 mg by mouth at bedtime.  . polyethylene glycol (MIRALAX / GLYCOLAX) packet Take 17 g by mouth daily as needed for mild constipation.  . tamsulosin (FLOMAX) 0.4 MG CAPS capsule Take 1 capsule (0.4 mg total) by mouth daily.  Marland Kitchen VIIBRYD 40 MG TABS Take 40 mg by mouth every morning.   No facility-administered encounter medications on file as of 03/03/2018.       SIGNIFICANT DIAGNOSTIC EXAMS   LABS REVIEWED:   01-27-18: hgb a1c 5.6  01-28-18: chol 149; ldl 81; trig 139; hdl 40  1-61-09: wbc 8.2; hgb  13.1; hct 38.9; mcv 94.1; plt 235; glucose 141; bun 26; creat 1.03; k+ 3.1; na++ 140 ca 9.8 liver normal albumin 9.8 urine culture: 50,000 lactobacillus   Review of Systems  Unable to perform ROS: Other (unable to participate )     Physical Exam  Constitutional: She appears well-developed and well-nourished. No distress.  Neck: No thyromegaly present.  Cardiovascular: Normal rate, regular rhythm, normal heart sounds and intact distal pulses.  Pulmonary/Chest: Effort normal and breath sounds normal. No respiratory distress.  Abdominal: Soft. Bowel sounds are normal. She exhibits no distension. There is no tenderness.  Musculoskeletal: She exhibits no edema.  Has right side weakness Slight right facial droop  Lymphadenopathy:    She has no cervical adenopathy.  Neurological: She is alert.  Skin: Skin is warm and dry. She is not diaphoretic.  Psychiatric: She has a normal mood and affect.      ASSESSMENT/ PLAN:  TODAY;   1.  Acute CVA: is neurologically stable; will continue asa 325 mg daily and plavix 75 mg daily  2.  Chronic diastolic heart failure: is stable will continue lasix 20 mg with k+ 20 meq daily as needed for edema  3. Hypertensive heart disease with chronic diastolic heart failure: is stable b/p 128/69: will continue clonidine 0.3 mg weekly patch; lisinopril hct 20-25 mg daily   4.  Chronic kidney disease stage III: is stable bun 26; creat 1.03  5. Dyslipidemia:  Is stable ldl 81 will continue lipitor 80 mg daily  6. Hypothyroidism due to atrophy of thyroid: is stable will continue synthroid 88 mcg daily   7. Bilateral glaucoma: is stable will continue xalatan to both eyes nightly   8. Depression with anxiety: is worse: will continue viibryd 40 mg daily remeron 45 mg nightly and will change ativan to 0.5 mg in the  AM and 0.4 mg in the afternoon.   9. Chronic retention of urine: is stable will continue flomax 0.4 mg daily     MD is aware of resident's narcotic use and is in agreement with current plan of care. We will attempt to wean resident as apropriate   Synthia Innocent NP [facility]  Contact [phone] Monday through Friday 8am- 5pm  After hours call [phone]

## 2018-03-06 DIAGNOSIS — H409 Unspecified glaucoma: Secondary | ICD-10-CM | POA: Insufficient documentation

## 2018-03-06 DIAGNOSIS — N183 Chronic kidney disease, stage 3 (moderate): Secondary | ICD-10-CM

## 2018-03-06 DIAGNOSIS — I110 Hypertensive heart disease with heart failure: Secondary | ICD-10-CM | POA: Insufficient documentation

## 2018-03-06 DIAGNOSIS — N179 Acute kidney failure, unspecified: Secondary | ICD-10-CM | POA: Insufficient documentation

## 2018-03-06 DIAGNOSIS — R338 Other retention of urine: Secondary | ICD-10-CM | POA: Insufficient documentation

## 2018-03-06 DIAGNOSIS — E034 Atrophy of thyroid (acquired): Secondary | ICD-10-CM | POA: Insufficient documentation

## 2018-03-06 DIAGNOSIS — F418 Other specified anxiety disorders: Secondary | ICD-10-CM | POA: Insufficient documentation

## 2018-03-06 DIAGNOSIS — E785 Hyperlipidemia, unspecified: Secondary | ICD-10-CM | POA: Insufficient documentation

## 2018-03-06 NOTE — Progress Notes (Deleted)
Cardiology Office Note  Date:  03/06/2018   ID:  Shawna [name], DOB 01/01/1936, MRN [id number]  PCP:  Shawna RegulusAnderson, Shawna W, MD   No chief complaint on file.   HPI:     PMH:   has a past medical history of Cerebral infarction (HCC), CKD (chronic kidney disease), stage III (HCC), Depression, DJD (degenerative joint disease), Edema, High cholesterol, Hyperlipidemia, unspecified, Hypertension, Hypothyroidism, Malaise, Morbid obesity (HCC), Osteoarthritis, Parathyroid abnormality (HCC), Seizures (HCC), Stroke (HCC) (2002, 05/2017, 01/26/18, 01/30/18), Subdural hematoma (HCC), Thyroid disease, TMJ (dislocation of temporomandibular joint), Tuberculosis, Urinary incontinence, and Weight loss.  PSH:    Past Surgical History:  Procedure Laterality Date  . ABDOMINAL HYSTERECTOMY    . BACK SURGERY    . BRAIN SURGERY    . cataract surgery     2014  . LEFT HEART CATH AND CORONARY ANGIOGRAPHY N/A 06/13/2017   Procedure: LEFT HEART CATH AND CORONARY ANGIOGRAPHY;  Surgeon: Iran OuchArida, Muhammad A, MD;  Location: [facility];  Service: Cardiovascular;  Laterality: N/A;  . PARATHYROID ADENOMA REMOVAL    . TEE WITHOUT CARDIOVERSION N/A 01/31/2018   Procedure: TRANSESOPHAGEAL ECHOCARDIOGRAM (TEE);  Surgeon: Yvonne KendallEnd, Christopher, MD;  Location: [facility] ORS;  Service: Cardiovascular;  Laterality: N/A;  . TEE WITHOUT CARDIOVERSION N/A 02/01/2018   Procedure: TRANSESOPHAGEAL ECHOCARDIOGRAM (TEE);  Surgeon: Antonieta IbaGollan, Labrittany Wechter J, MD;  Location: [facility] ORS;  Service: Cardiovascular;  Laterality: N/A;    Current Outpatient Medications  Medication Sig Dispense Refill  . acetaminophen (TYLENOL) 325 MG tablet Take 650 mg by mouth every 4 (four) hours as needed for mild pain or moderate pain.     Marland Kitchen. aspirin 325 MG tablet Take 1 tablet (325 mg total) by mouth daily. 30 tablet 2  . atorvastatin (LIPITOR) 80 MG tablet Take 1 tablet (80 mg total) by mouth daily at 6 PM. 30 tablet 2  . cloNIDine (CATAPRES - DOSED IN MG/24 HR) 0.3  mg/24hr patch Place 1 patch onto the skin every Friday.   3  . clopidogrel (PLAVIX) 75 MG tablet Take 1 tablet (75 mg total) by mouth daily. 30 tablet 2  . furosemide (LASIX) 20 MG tablet Take 1 tablet (20 mg total) by mouth daily as needed for fluid or edema. 30 tablet   . KLOR-CON M20 20 MEQ tablet Take 1 tablet (20 mEq total) by mouth daily as needed. While on lasix 30 tablet 2  . latanoprost (XALATAN) 0.005 % ophthalmic solution Place 1 drop into both eyes at bedtime.   3  . levothyroxine (SYNTHROID, LEVOTHROID) 88 MCG tablet Take 1 tablet by mouth daily.   1  . lisinopril-hydrochlorothiazide (PRINZIDE,ZESTORETIC) 20-25 MG tablet Take 1 tablet by mouth daily.    Marland Kitchen. LORazepam (ATIVAN) 0.5 MG tablet Take 1 tablet (0.5 mg total) by mouth 2 (two) times daily. In morning and at 2 pm for anxiety and agitation 60 tablet 0  . mirtazapine (REMERON) 45 MG tablet Take 45 mg by mouth at bedtime.  4  . polyethylene glycol (MIRALAX / GLYCOLAX) packet Take 17 g by mouth daily as needed for mild constipation. 14 each 0  . tamsulosin (FLOMAX) 0.4 MG CAPS capsule Take 1 capsule (0.4 mg total) by mouth daily. 30 capsule 1  . VIIBRYD 40 MG TABS Take 40 mg by mouth every morning.  1   No current facility-administered medications for this visit.      Allergies:   Patient has no known allergies.   Social History:  The patient  reports that  she has never smoked. She has never used smokeless tobacco. She reports that she does not drink alcohol or use drugs.   Family History:   family history includes Heart attack in her father; Heart failure in her mother; Stroke in her father.    Review of Systems: ROS   PHYSICAL EXAM: VS:  There were no vitals taken for this visit. , BMI There is no height or weight on file to calculate BMI. GEN: Well nourished, well developed, in no acute distress HEENT: normal Neck: no JVD, carotid bruits, or masses Cardiac: RRR; no murmurs, rubs, or gallops,no edema  Respiratory:   clear to auscultation bilaterally, normal work of breathing GI: soft, nontender, nondistended, + BS MS: no deformity or atrophy Skin: warm and dry, no rash Neuro:  Strength and sensation are intact Psych: euthymic mood, full affect    Recent Labs: 06/10/2017: B Natriuretic Peptide 107.0 02/12/2018: ALT 21; BUN 26; Creatinine, Ser 1.03; Hemoglobin 13.1; Platelets 235; Potassium 3.1; Sodium 140    Lipid Panel Lab Results  Component Value Date   CHOL 149 01/28/2018   HDL 40 (L) 01/28/2018   LDLCALC 81 01/28/2018   TRIG 139 01/28/2018      Wt Readings from Last 3 Encounters:  03/03/18 222 lb 14.2 oz (101.1 kg)  02/12/18 222 lb 14.2 oz (101.1 kg)  02/01/18 218 lb (98.9 kg)       ASSESSMENT AND PLAN:  No diagnosis found.   Disposition:   F/U  6 months  No orders of the defined types were placed in this encounter.    Signed, Dossie Arbour, M.D., Ph.D. 03/06/2018  [facility] [place], [place] [phone]

## 2018-03-07 ENCOUNTER — Telehealth: Payer: Self-pay | Admitting: *Deleted

## 2018-03-07 ENCOUNTER — Non-Acute Institutional Stay (SKILLED_NURSING_FACILITY): Payer: Medicare Other | Admitting: Adult Health

## 2018-03-07 ENCOUNTER — Encounter: Payer: Self-pay | Admitting: Adult Health

## 2018-03-07 DIAGNOSIS — H9201 Otalgia, right ear: Secondary | ICD-10-CM

## 2018-03-07 NOTE — Telephone Encounter (Signed)
-----   Message from Bryna ColanderPamela S Allen, RN sent at 03/07/2018 12:16 PM EDT ----- Regarding: FW: reveal   ----- Message ----- From: Antonieta IbaGollan, Timothy J, MD Sent: 03/06/2018  10:03 PM To: Duke SalviaSteven C Klein, MD, Oneida ArenasNancy E Everett, # Subject: reveal                                         She is on the schedule with me on Wednesday which we can probably cancel I actually performed the TEE in the hospital She actually needs an appt with Graciela HusbandsKlein to set up placement of reveal (recent CVAs) Do we have an appt for the patient with Dr. Graciela HusbandsKlein? thx TG

## 2018-03-07 NOTE — Progress Notes (Signed)
Location:  [facility] Room Number: 310B Place of Service:  SNF [phone]) Provider:  Kenard GowerMedina-Vargas, Chasitie Passey, NP  Patient Care Team: Lauro RegulusAnderson, Marshall W, MD as PCP - General (Internal Medicine) Antonieta IbaGollan, Timothy J, MD as Consulting Physician (Cardiology)  Extended Emergency Contact Information Primary Emergency Contact: Freda MunroJeffries,James W Address: [address]          [place], [place] [id number] [place] Home Phone: [phone] Work Phone: [phone] Relation: Spouse Secondary Emergency Contact: Burman FosterJeffries,[name]  [place] Mobile Phone: [phone] Relation: Daughter  Code Status:  FULL  Goals of care: Advanced Directive information Advanced Directives 03/07/2018  Does Patient Have a Medical Advance Directive? No  Type of Advance Directive -  Does patient want to make changes to medical advance directive? -  Copy of Healthcare Power of Attorney in Chart? -  Would patient like information on creating a medical advance directive? No - Patient declined     Chief Complaint  Patient presents with  . Acute Visit    Right ear pain    HPI:  Pt is a 82 y.o. female seen today for an acute visit. She complained of her right ear pain. She was seen in the room today with a sitter beside her. She wanted to get out of her geri-chair. No fever has been reported. She reported that she does not have earache at this time. No drainage on right ear. She is alert to self, but disoriented to time and place. She has PMH of CVA, hypertension and heart failure.   Past Medical History:  Diagnosis Date  . Cerebral infarction (HCC)   . CKD (chronic kidney disease), stage III (HCC)   . Depression   . DJD (degenerative joint disease)   . Edema   . High cholesterol   . Hyperlipidemia, unspecified   . Hypertension   . Hypothyroidism   . Malaise   . Morbid obesity (HCC)   . Osteoarthritis   . Parathyroid abnormality (HCC)   . Seizures  (HCC)    a. following remote stroke.  . Stroke (HCC) 2002, 05/2017, 01/26/18, 01/30/18   a. 1964-->residual right arm wkns.  Previously on coumadin - pt says for just a few yrs.  . Subdural hematoma (HCC)    a. 10/2001 [facility] req temporal frontoparietal craniotomy following fall. ? whether or not pt on coumadin @ time.  Notes indicate yes but pt denies.  . Thyroid disease   . TMJ (dislocation of temporomandibular joint)   . Tuberculosis    a. ~ 1950  . Urinary incontinence   . Weight loss    Past Surgical History:  Procedure Laterality Date  . ABDOMINAL HYSTERECTOMY    . BACK SURGERY    . BRAIN SURGERY    . cataract surgery     2014  . LEFT HEART CATH AND CORONARY ANGIOGRAPHY N/A 06/13/2017   Procedure: LEFT HEART CATH AND CORONARY ANGIOGRAPHY;  Surgeon: Iran OuchArida, Muhammad A, MD;  Location: [facility] INV[facility]IVE CV LAB;  Service: Cardiovascular;  Laterality: N/A;  . PARATHYROID ADENOMA REMOVAL    . TEE WITHOUT CARDIOVERSION N/A 01/31/2018   Procedure: TRANSESOPHAGEAL ECHOCARDIOGRAM (TEE);  Surgeon: Yvonne KendallEnd, Christopher, MD;  Location: [facility] ORS;  Service: Cardiovascular;  Laterality: N/A;  . TEE WITHOUT CARDIOVERSION N/A 02/01/2018   Procedure: TRANSESOPHAGEAL ECHOCARDIOGRAM (TEE);  Surgeon: Antonieta IbaGollan, Timothy J, MD;  Location: [facility] ORS;  Service: Cardiovascular;  Laterality: N/A;    No Known Allergies  Outpatient Encounter Medications as of 03/07/2018  Medication Sig  . acetaminophen (TYLENOL) 325 MG tablet Take 650 mg by mouth every 4 (four) hours as needed for mild pain or moderate pain.   Marland Kitchen aspirin 325 MG tablet Take 1 tablet (325 mg total) by mouth daily.  Marland Kitchen atorvastatin (LIPITOR) 80 MG tablet Take 1 tablet (80 mg total) by mouth daily at 6 PM.  . cloNIDine (CATAPRES - DOSED IN MG/24 HR) 0.3 mg/24hr patch Place 1 patch onto the skin every Friday.   . clopidogrel (PLAVIX) 75 MG tablet Take 1 tablet (75 mg total) by mouth daily.  . furosemide (LASIX) 20 MG tablet Take 1 tablet (20 mg total) by mouth daily  as needed for fluid or edema.  Marland Kitchen KLOR-CON M20 20 MEQ tablet Take 1 tablet (20 mEq total) by mouth daily as needed. While on lasix  . latanoprost (XALATAN) 0.005 % ophthalmic solution Place 1 drop into both eyes at bedtime.   Marland Kitchen levothyroxine (SYNTHROID, LEVOTHROID) 88 MCG tablet Take 1 tablet by mouth daily.   Marland Kitchen lisinopril-hydrochlorothiazide (PRINZIDE,ZESTORETIC) 20-25 MG tablet Take 1 tablet by mouth daily.  Marland Kitchen LORazepam (ATIVAN) 0.5 MG tablet Take 1 tablet (0.5 mg total) by mouth 2 (two) times daily. In morning and at 2 pm for anxiety and agitation  . mirtazapine (REMERON) 45 MG tablet Take 45 mg by mouth at bedtime.  . polyethylene glycol (MIRALAX / GLYCOLAX) packet Take 17 g by mouth daily as needed for mild constipation.  . tamsulosin (FLOMAX) 0.4 MG CAPS capsule Take 1 capsule (0.4 mg total) by mouth daily.  Marland Kitchen VIIBRYD 40 MG TABS Take 40 mg by mouth every morning.   No facility-administered encounter medications on file as of 03/07/2018.     Review of Systems  Unable to obtain due to confusion    Immunization History  Administered Date(s) Administered  . Influenza Split 07/18/2014, 07/23/2015  . Influenza, High Dose Seasonal PF 06/14/2017  . Influenza-Unspecified 05/25/2012, 05/31/2016  . Pneumococcal Polysaccharide-23 08/30/2004, 08/04/2017   Pertinent  Health Maintenance Due  Topic Date Due  . DEXA SCAN  08/03/2003  . INFLUENZA VACCINE  03/30/2018  . PNA vac Low Risk Adult (2 of 2 - PCV13) 08/04/2018      Vitals:   03/07/18 1027  BP: 136/66  Pulse: 88  Resp: 20  Temp: 98.1 F (36.7 C)  TempSrc: Oral  SpO2: 96%  Weight: 222 lb 14.2 oz (101.1 kg)  Height: 5\' 5"  (1.651 m)   Body mass index is 37.09 kg/m.  Physical Exam  GENERAL APPEARANCE: Well nourished. In no acute distress. Obese SKIN:  Skin is warm and dry.  EARS:  Pinnae are normal. Patient hears normal voice tunes, no erythema MOUTH and THROAT: Lips are without lesions. Oral mucosa is moist and without  lesions.  RESPIRATORY: Breathing is even & unlabored, BS CTAB CARDIAC: RRR, no murmur,no extra heart sounds, no edema GI: Abdomen soft, normal BS, no masses, no tenderness EXTREMITIES: Able to move X 4 extremities NEUROLOGICAL: Right-sided weakness PSYCHIATRIC: Alert to self, disoriented to time and place. Affect and behavior are appropriate   Labs reviewed: Recent Labs    01/28/18 0414 01/29/18 0436 02/12/18 1019  NA 138 141 140  K 3.3* 3.9 3.1*  CL 103 107 101  CO2 26 27 31   GLUCOSE 120* 120* 141*  BUN 19 21* 26*  CREATININE 1.00 1.14* 1.03*  CALCIUM 9.1 9.1 9.8   Recent Labs    06/10/17 0845 01/26/18 1510 02/12/18 1019  AST 65* 24 28  ALT 46 13* 21  ALKPHOS 57 84 73  BILITOT 0.7 0.9 0.6  PROT 7.6 8.2* 7.3  ALBUMIN 3.5 4.8 3.7   Recent Labs    01/26/18 1510 01/31/18 0430 02/12/18 1018  WBC 6.1 8.2 8.2  NEUTROABS 4.4  --   --   HGB 14.4 12.9 13.1  HCT 42.4 38.5 38.9  MCV 95.1 94.0 94.1  PLT 114* 110* 235   No results found for: TSH Lab Results  Component Value Date   HGBA1C 5.6 01/27/2018   Lab Results  Component Value Date   CHOL 149 01/28/2018   HDL 40 (L) 01/28/2018   LDLCALC 81 01/28/2018   TRIG 139 01/28/2018   CHOLHDL 3.7 01/28/2018    Significant Diagnostic Results in last 30 days:  Dg Chest 2 View  Result Date: 02/12/2018 CLINICAL DATA:  Altered mental status. EXAM: CHEST - 2 VIEW COMPARISON:  01/31/2018 FINDINGS: Normal heart size. No pleural effusions identified. Chronic asymmetric elevation of left hemidiaphragm with overlying parenchymal scarring. Unchanged. IMPRESSION: No active cardiopulmonary disease. Electronically Signed   By: Signa Kell M.D.   On: 02/12/2018 10:38   Ct Head Wo Contrast  Result Date: 02/12/2018 CLINICAL DATA:  Recent stroke.  Neuro deficit. EXAM: CT HEAD WITHOUT CONTRAST TECHNIQUE: Contiguous axial images were obtained from the base of the skull through the vertex without intravenous contrast. COMPARISON:  MRI  head 01/30/2018, CT head 01/29/2018 FINDINGS: Brain: Hypodensity left occipital lobe and left posteromedial temporal lobe compatible with recent infarction as noted on MRI. No extension of infarction Right craniectomy and cranioplasty. Encephalomalacia in the high right frontal parietal lobe and right temporal lobe is chronic and unchanged. Chronic infarct in the left frontal periventricular white matter unchanged. Generalized atrophy with ventricular enlargement unchanged Negative for hemorrhage or mass Vascular: Negative for hyperdense vessel Skull: Right-sided craniectomy with cranioplasty Sinuses/Orbits: Mild mucosal edema paranasal sinuses. Bilateral cataract surgery Other: None IMPRESSION: Acute/subacute left posterior cerebral artery infarct as noted on recent MRI. No extension or hemorrhage. Electronically Signed   By: Marlan Palau M.D.   On: 02/12/2018 11:00    Assessment/Plan  1. Right ear pain - she denies ear pain at this time, no noted erythema, drainage, nor fever; will monitor for any redness, drainage or pain, continue Acetaminophen 325 mg give 2 tabs PO Q 4 hours PRN for pain     Family/ staff Communication: Discussed plan of care with patient and sitter.  Labs/tests ordered:  None  Goals of care:   Short-term care   Kenard Gower, NP [facility] [phone] (Monday-Friday 8:00 a.m. - 5:00 p.m.) [phone] (after hours)

## 2018-03-07 NOTE — Telephone Encounter (Signed)
Annice PihJackie is returning call

## 2018-03-07 NOTE — Telephone Encounter (Signed)
-----   Message from Antonieta Ibaimothy J Gollan, MD sent at 03/06/2018 10:03 PM EDT ----- Regarding: reveal She is on the schedule with me on Wednesday which we can probably cancel I actually performed the TEE in the hospital She actually needs an appt with Graciela HusbandsKlein to set up placement of reveal (recent CVAs) Do we have an appt for the patient with Dr. Graciela HusbandsKlein? thx TG

## 2018-03-07 NOTE — Telephone Encounter (Signed)
Spoke with patients daughter per release form. Reviewed that provider recommended a linq device for her mother. She inquired about what this would be looking for and discussed at length indications for this device. Also discussed that appointment tomorrow could be canceled if she wants to move forward with device placement. She wanted to have the opportunity to discuss this with patients spouse first and that she would then call us back. She will call back later today with their decision. Instructed her to ask for either me or Heather.

## 2018-03-07 NOTE — Telephone Encounter (Signed)
I attempted to call the patient's daughter, Annice PihJackie (ok per [facility]) to schedule a LINQ with Dr. Graciela HusbandsKlein per Dr. Mariah MillingGollan.  I left a message for Annice PihJackie to call back.

## 2018-03-08 ENCOUNTER — Encounter

## 2018-03-08 ENCOUNTER — Ambulatory Visit: Payer: Medicare Other | Admitting: Cardiovascular Disease

## 2018-03-30 ENCOUNTER — Encounter
Admission: RE | Admit: 2018-03-30 | Discharge: 2018-03-30 | Disposition: A | Discharge status: Home / Self Care | Payer: Medicare Other | Source: Ambulatory Visit | Attending: Internal Medicine | Admitting: Internal Medicine

## 2018-04-03 ENCOUNTER — Non-Acute Institutional Stay (SKILLED_NURSING_FACILITY): Payer: Medicare Other | Admitting: Adult Health

## 2018-04-03 ENCOUNTER — Encounter: Payer: Self-pay | Admitting: Adult Health

## 2018-04-03 DIAGNOSIS — I639 Cerebral infarction, unspecified: Secondary | ICD-10-CM | POA: Diagnosis not present

## 2018-04-03 DIAGNOSIS — I110 Hypertensive heart disease with heart failure: Secondary | ICD-10-CM | POA: Diagnosis not present

## 2018-04-03 DIAGNOSIS — N183 Chronic kidney disease, stage 3 unspecified: Secondary | ICD-10-CM

## 2018-04-03 DIAGNOSIS — I5032 Chronic diastolic (congestive) heart failure: Secondary | ICD-10-CM

## 2018-04-03 NOTE — Progress Notes (Signed)
Location:   [facility] Room Number: 310B Place of Service:  SNF (31)   CODE STATUS: FULL  No Known Allergies  Chief Complaint  Patient presents with  . Medical Management of Chronic Issues    Cva; hypertensive heart disease; chf; CKD stage 3.     HPI:  She is a 82 year old long term resident of this facility being seen for the management of her chronic illnesses: cva; hypertensive heart disease; chf; ckd stage 3. She is unable to participate in the hpi or ros. There are no reports of chest pain; no edema; no changes in appetite. There are no nursing concerns at this time.    Past Medical History:  Diagnosis Date  . Cerebral infarction (HCC)   . CKD (chronic kidney disease), stage III (HCC)   . Depression   . DJD (degenerative joint disease)   . Edema   . High cholesterol   . Hyperlipidemia, unspecified   . Hypertension   . Hypothyroidism   . Malaise   . Morbid obesity (HCC)   . Osteoarthritis   . Parathyroid abnormality (HCC)   . Seizures (HCC)    a. following remote stroke.  . Stroke (HCC) 2002, 05/2017, 01/26/18, 01/30/18   a. 1964-->residual right arm wkns.  Previously on coumadin - pt says for just a few yrs.  . Subdural hematoma (HCC)    a. 10/2001 [facility] req temporal frontoparietal craniotomy following fall. ? whether or not pt on coumadin @ time.  Notes indicate yes but pt denies.  . Thyroid disease   . TMJ (dislocation of temporomandibular joint)   . Tuberculosis    a. ~ 1950  . Urinary incontinence   . Weight loss     Past Surgical History:  Procedure Laterality Date  . ABDOMINAL HYSTERECTOMY    . BACK SURGERY    . BRAIN SURGERY    . cataract surgery     2014  . LEFT HEART CATH AND CORONARY ANGIOGRAPHY N/A 06/13/2017   Procedure: LEFT HEART CATH AND CORONARY ANGIOGRAPHY;  Surgeon: Iran Ouch, MD;  Location: [facility] INVASIVE CV LAB;  Service: Cardiovascular;  Laterality: N/A;  . PARATHYROID ADENOMA REMOVAL    . TEE WITHOUT  CARDIOVERSION N/A 01/31/2018   Procedure: TRANSESOPHAGEAL ECHOCARDIOGRAM (TEE);  Surgeon: Yvonne Kendall, MD;  Location: [facility] ORS;  Service: Cardiovascular;  Laterality: N/A;  . TEE WITHOUT CARDIOVERSION N/A 02/01/2018   Procedure: TRANSESOPHAGEAL ECHOCARDIOGRAM (TEE);  Surgeon: Antonieta Iba, MD;  Location: [facility] ORS;  Service: Cardiovascular;  Laterality: N/A;    Social History   Socioeconomic History  . Marital status: Married    Spouse name: Fayrene Fearing  . Number of children: 2  . Years of education: BS  . Highest education level: Not on file  Occupational History  . Not on file  Social Needs  . Financial resource strain: Not on file  . Food insecurity:    Worry: Not on file    Inability: Not on file  . Transportation needs:    Medical: Not on file    Non-medical: Not on file  Tobacco Use  . Smoking status: Never Smoker  . Smokeless tobacco: Never Used  Substance and Sexual Activity  . Alcohol use: No  . Drug use: No  . Sexual activity: Not on file  Lifestyle  . Physical activity:    Days per week: Not on file    Minutes per session: Not on file  . Stress: Not on file  Relationships  . Social connections:    Talks on phone: Not on file    Gets together: Not on file    Attends religious service: Not on file    Active member of club or organization: Not on file    Attends meetings of clubs or organizations: Not on file    Relationship status: Not on file  . Intimate partner violence:    Fear of current or ex partner: Not on file    Emotionally abused: Not on file    Physically abused: Not on file    Forced sexual activity: Not on file  Other Topics Concern  . Not on file  Social History Narrative   [facility]/22/18 [facility] [place]   [place].  Active around the house.  Does not routinely exercise or drive.  Does own grocery shopping.   Family History  Problem Relation Age of Onset  . Heart failure  Mother        died @ 5288  . Heart attack Father        died @ 6088  . Stroke Father   . Breast cancer Neg Hx       VITAL SIGNS BP (!) 142/68   Temp 98.2 F (36.8 C) (Oral)   Resp (!) 24   Ht 5\' 1"  (1.549 m)   Wt 214 lb (97.1 kg)   SpO2 96%   BMI 40.43 kg/m   Outpatient Encounter Medications as of 04/03/2018  Medication Sig  . acetaminophen (TYLENOL) 325 MG tablet Take 650 mg by mouth every 4 (four) hours as needed for mild pain or moderate pain.   Marland Kitchen. aspirin 325 MG tablet Take 1 tablet (325 mg total) by mouth daily.  Marland Kitchen. atorvastatin (LIPITOR) 80 MG tablet Take 1 tablet (80 mg total) by mouth daily at 6 PM.  . cloNIDine (CATAPRES - DOSED IN MG/24 HR) 0.3 mg/24hr patch Place 1 patch onto the skin every Friday.   . clopidogrel (PLAVIX) 75 MG tablet Take 1 tablet (75 mg total) by mouth daily.  . furosemide (LASIX) 20 MG tablet Take 1 tablet (20 mg total) by mouth daily as needed for fluid or edema.  Marland Kitchen. KLOR-CON M20 20 MEQ tablet Take 1 tablet (20 mEq total) by mouth daily as needed. While on lasix  . latanoprost (XALATAN) 0.005 % ophthalmic solution Place 1 drop into both eyes at bedtime.   Marland Kitchen. levothyroxine (SYNTHROID, LEVOTHROID) 88 MCG tablet Take 1 tablet by mouth daily.   Marland Kitchen. lisinopril-hydrochlorothiazide (PRINZIDE,ZESTORETIC) 20-25 MG tablet Take 1 tablet by mouth daily.  Marland Kitchen. LORazepam (ATIVAN) 0.5 MG tablet Take 1 tablet (0.5 mg total) by mouth 2 (two) times daily. In morning and at 2 pm for anxiety and agitation  . mirtazapine (REMERON) 45 MG tablet Take 45 mg by mouth at bedtime.  . Nutritional Supplements (NUTRITIONAL SUPPLEMENT PO) Diet Type: Heart healthy  . polyethylene glycol (MIRALAX / GLYCOLAX) packet Take 17 g by mouth daily as needed for mild constipation.  . tamsulosin (FLOMAX) 0.4 MG CAPS capsule Take 1 capsule (0.4 mg total) by mouth daily.  Marland Kitchen. VIIBRYD 40 MG TABS Take 40 mg by mouth every morning.   No facility-administered encounter medications on file as of 04/03/2018.       SIGNIFICANT DIAGNOSTIC EXAMS   LABS REVIEWED:   01-27-18: hgb a1c 5.6  01-28-18: chol 149; ldl 81; trig 139; hdl 40  [id number]: wbc 8.2; hgb 13.1; hct  38.9; mcv 94.1; plt 235; glucose 141; bun 26; creat 1.03; k+ 3.1; na++ 140 ca 9.8 liver normal albumin 9.8 urine culture: 50,000 lactobacillus  NO NEW LABS.    Review of Systems  Unable to perform ROS: Dementia (unable to participate )    Physical Exam  Constitutional: She appears well-developed and well-nourished. No distress.  Neck: No thyromegaly present.  Cardiovascular: Normal rate, regular rhythm, normal heart sounds and intact distal pulses.  Pulmonary/Chest: Effort normal and breath sounds normal. No respiratory distress.  Abdominal: Soft. Bowel sounds are normal. She exhibits no distension. There is no tenderness.  Musculoskeletal: She exhibits no edema.  Has right side weakness Slight right facial droop  Is able to move all extremities   Lymphadenopathy:    She has no cervical adenopathy.  Neurological: She is alert.  Skin: Skin is warm and dry. She is not diaphoretic.  Psychiatric: She has a normal mood and affect.       ASSESSMENT/ PLAN:  TODAY;   1.  Acute CVA: is neurologically stable; will continue asa 325 mg daily and plavix 75 mg daily  2.  Chronic diastolic heart failure: is stable will continue lasix 20 mg with k+ 20 meq daily as needed for edema  3. Hypertensive heart disease with chronic diastolic heart failure: is stable b/p 142/68: will continue clonidine 0.3 mg weekly patch; lisinopril hct 20-25 mg daily   4.  Chronic kidney disease stage III: is stable bun 26; creat 1.03  PREVIOUS   5. Dyslipidemia:  Is stable ldl 81 will continue lipitor 80 mg daily  6. Hypothyroidism due to atrophy of thyroid: is stable will continue synthroid 88 mcg daily   7. Bilateral glaucoma: is stable will continue xalatan to both eyes nightly   8. Depression with anxiety: is worse: will continue viibryd 40 mg  daily remeron 45 mg nightly  ativan  0.5 mg in the AM and 0.4 mg in the afternoon.   9. Chronic retention of urine: is stable will continue flomax 0.4 mg daily       ASSESSMENT/ PLAN:    MD is aware of resident's narcotic use and is in agreement with current plan of care. We will attempt to wean resident as apropriate   Synthia Innocent NP [facility]  Contact [phone] Monday through Friday 8am- 5pm  After hours call [phone]

## 2018-04-12 ENCOUNTER — Other Ambulatory Visit: Payer: Self-pay

## 2018-04-12 MED ORDER — LORAZEPAM 0.5 MG PO TABS: 0.5000 mg | ORAL_TABLET | Freq: Two times a day (BID) | ORAL | 0 refills | Status: DC

## 2018-04-12 NOTE — Telephone Encounter (Signed)
Rx sent to [facility] phone : [phone] , fax : [phone]  

## 2018-04-13 ENCOUNTER — Non-Acute Institutional Stay (SKILLED_NURSING_FACILITY): Payer: Medicare Other | Admitting: Adult Health

## 2018-04-13 ENCOUNTER — Encounter: Payer: Self-pay | Admitting: Adult Health

## 2018-04-13 DIAGNOSIS — I639 Cerebral infarction, unspecified: Secondary | ICD-10-CM | POA: Diagnosis not present

## 2018-04-13 DIAGNOSIS — F418 Other specified anxiety disorders: Secondary | ICD-10-CM

## 2018-04-13 NOTE — Progress Notes (Signed)
[facility] Room Number: 310B Place of Service:  SNF ([phone]) Provider:  Kenard Gower, NP  Patient Care Team: Lauro Regulus, MD as PCP - General (Internal Medicine) Antonieta Iba, MD as Consulting Physician (Cardiology)  Extended Emergency Contact Information Primary Emergency Contact: Freda Munro Address: [address]          [place], [place] [address] [place] of [place] Home Phone: [phone] Mobile Phone: [phone] Relation: Spouse Secondary Emergency Contact: Burman [place] Home Phone: [phone] Mobile Phone: [phone] Relation: Daughter  Code Status:  FULL CODE  Goals of care: Advanced Directive information Advanced Directives 04/13/2018  Does Patient Have a Medical Advance Directive? No  Type of Advance Directive -  Does patient want to make changes to medical advance directive? -  Copy of Healthcare Power of Attorney in Chart? -  Would patient like information on creating a medical advance directive? No - Patient declined     Chief Complaint  Patient presents with  . Acute Visit    Increased Agitiation    HPI:  Pt is a 82 y.o. female seen today for increased agitiation.  She is a long-term care resident of [facility] [place].  She has a PMH of CVA, hypertension, and heart failure. She was seen in the room with sitter at bedside. She was pleasant and no note inappropriate behavior noted. She was reported to have been yelling for help last night and was agitated.    Past Medical History:  Diagnosis Date  . Cerebral infarction (HCC)   . CKD (chronic kidney disease), stage III (HCC)   . Depression   . DJD (degenerative joint disease)   . Edema   . High cholesterol   . Hyperlipidemia, unspecified   . Hypertension   . Hypothyroidism   . Malaise   . Morbid obesity (HCC)   . Osteoarthritis   . Parathyroid abnormality (HCC)   . Seizures (HCC)    a. following  remote stroke.  . Stroke (HCC) 2002, 05/2017, 01/26/18, 01/30/18   a. 1964-->residual right arm wkns.  Previously on coumadin - pt says for just a few yrs.  . Subdural hematoma (HCC)    a. 10/2001 [facility] req temporal frontoparietal craniotomy following fall. ? whether or not pt on coumadin @ time.  Notes indicate yes but pt denies.  . Thyroid disease   . TMJ (dislocation of temporomandibular joint)   . Tuberculosis    a. ~ 1950  . Urinary incontinence   . Weight loss    Past Surgical History:  Procedure Laterality Date  . ABDOMINAL HYSTERECTOMY    . BACK SURGERY    . BRAIN SURGERY    . cataract surgery     2014  . LEFT HEART CATH AND CORONARY ANGIOGRAPHY N/A 06/13/2017   Procedure: LEFT HEART CATH AND CORONARY ANGIOGRAPHY;  Surgeon: Iran Ouch, MD;  Location: [facility] INVASIVE CV LAB;  Service: Cardiovascular;  Laterality: N/A;  . PARATHYROID ADENOMA REMOVAL    . TEE WITHOUT CARDIOVERSION N/A 01/31/2018   Procedure: TRANSESOPHAGEAL ECHOCARDIOGRAM (TEE);  Surgeon: Yvonne Kendall, MD;  Location: [facility] ORS;  Service: Cardiovascular;  Laterality: N/A;  . TEE WITHOUT CARDIOVERSION N/A 02/01/2018   Procedure: TRANSESOPHAGEAL ECHOCARDIOGRAM (TEE);  Surgeon: Antonieta Iba, MD;  Location: [facility] ORS;  Service: Cardiovascular;  Laterality: N/A;    No Known Allergies  Outpatient Encounter Medications as of 04/13/2018  Medication Sig  . acetaminophen (TYLENOL) 325 MG tablet Take 650  mg by mouth every 4 (four) hours as needed for mild pain or moderate pain.   Marland Kitchen. aspirin 325 MG tablet Take 1 tablet (325 mg total) by mouth daily.  Marland Kitchen. atorvastatin (LIPITOR) 80 MG tablet Take 1 tablet (80 mg total) by mouth daily at 6 PM.  . cloNIDine (CATAPRES - DOSED IN MG/24 HR) 0.3 mg/24hr patch Place 1 patch onto the skin every Friday.   . clopidogrel (PLAVIX) 75 MG tablet Take 1 tablet (75 mg total) by mouth daily.  . furosemide (LASIX) 20 MG tablet Take 1 tablet (20 mg total) by mouth daily as needed for fluid or  edema.  Marland Kitchen. KLOR-CON M20 20 MEQ tablet Take 1 tablet (20 mEq total) by mouth daily as needed. While on lasix  . latanoprost (XALATAN) 0.005 % ophthalmic solution Place 1 drop into both eyes at bedtime.   Marland Kitchen. levothyroxine (SYNTHROID, LEVOTHROID) 88 MCG tablet Take 1 tablet by mouth daily.   Marland Kitchen. lisinopril-hydrochlorothiazide (PRINZIDE,ZESTORETIC) 20-25 MG tablet Take 1 tablet by mouth daily.  Marland Kitchen. LORazepam (ATIVAN) 0.5 MG tablet Take 1 tablet (0.5 mg total) by mouth 2 (two) times daily. In morning and at 2 pm for anxiety and agitation  . mirtazapine (REMERON) 45 MG tablet Take 45 mg by mouth at bedtime.  . Nutritional Supplements (NUTRITIONAL SUPPLEMENT PO) Diet Type: Heart healthy  . polyethylene glycol (MIRALAX / GLYCOLAX) packet Take 17 g by mouth daily as needed for mild constipation.  . tamsulosin (FLOMAX) 0.4 MG CAPS capsule Take 1 capsule (0.4 mg total) by mouth daily.  Marland Kitchen. VIIBRYD 40 MG TABS Take 40 mg by mouth every morning.   No facility-administered encounter medications on file as of 04/13/2018.     Review of Systems  Unable to obtain due to confusion/recent stroke     Immunization History  Administered Date(s) Administered  . Influenza Split 07/18/2014, 07/23/2015  . Influenza, High Dose Seasonal PF 06/14/2017  . Influenza-Unspecified 05/25/2012, 05/31/2016  . Pneumococcal Polysaccharide-23 08/30/2004, 08/04/2017   Pertinent  Health Maintenance Due  Topic Date Due  . DEXA SCAN  08/03/2003  . INFLUENZA VACCINE  03/30/2018  . PNA vac Low Risk Adult (2 of 2 - PCV13) 08/04/2018      Vitals:   04/13/18 1453  BP: 136/68  Pulse: 82  Resp: 16  Temp: 97.6 F (36.4 C)  TempSrc: Oral  SpO2: 99%  Weight: 216 lb 3.2 oz (98.1 kg)  Height: 5\' 1"  (1.549 m)   Body mass index is 40.85 kg/m.  Physical Exam  GENERAL APPEARANCE: Well nourished. In no acute distress.Morbidly obese. SKIN:  Skin is warm and dry.  MOUTH and THROAT: Lips are without lesions. Oral mucosa is moist and  without lesions.  RESPIRATORY: Breathing is even & unlabored, BS CTAB CARDIAC: RRR, no murmur,no extra heart sounds, no edema GI: Abdomen soft, normal BS, no masses, no tenderness EXTREMITIES:  Able to move X 4 extremities NEUROLOGICAL: Right-sided weakness PSYCHIATRIC: Alert to self, disoriented to time and place. Affect and behavior are appropriate   Labs reviewed: Recent Labs    01/28/18 0414 01/29/18 0436 02/12/18 1019  NA 138 141 140  K 3.3* 3.9 3.1*  CL 103 107 101  CO2 26 27 31   GLUCOSE 120* 120* 141*  BUN 19 21* 26*  CREATININE 1.00 1.14* 1.03*  CALCIUM 9.1 9.1 9.8   Recent Labs    06/10/17 0845 01/26/18 1510 02/12/18 1019  AST 65* 24 28  ALT 46 13* 21  ALKPHOS 57 84 73  BILITOT 0.7 0.9 0.6  PROT 7.6 8.2* 7.3  ALBUMIN 3.5 4.8 3.7   Recent Labs    01/26/18 1510 01/31/18 0430 02/12/18 1018  WBC 6.1 8.2 8.2  NEUTROABS 4.4  --   --   HGB 14.4 12.9 13.1  HCT 42.4 38.5 38.9  MCV 95.1 94.0 94.1  PLT 114* 110* 235    Lab Results  Component Value Date   HGBA1C 5.6 01/27/2018   Lab Results  Component Value Date   CHOL 149 01/28/2018   HDL 40 (L) 01/28/2018   LDLCALC 81 01/28/2018   TRIG 139 01/28/2018   CHOLHDL 3.7 01/28/2018    Assessment/Plan  1. Depression with anxiety - continue Viibryd 0 mg1 tab daily, mirtazapine 45 mg 1 tab daily at bedtime  2. Cerebrovascular accident (CVA), unspecified mechanism (HCC) - stable, continue aspirin 325 mg 1 tab daily, atorvastatin 80 mg 1 tab daily, Clonidine 0.3 mg/24-hour 1 patch  Daily on fridays, lisinopril-HCTZ 20-25 mg 1 tab daily,     Family/ staff Communication: Discussed plan of care with charge nurse.  Labs/tests ordered:  None  Goals of care:   Short-term care.   Kenard GowerMonina Medina-Vargas, NP [facility] [phone] (Monday-Friday 8:00 a.m. - 5:00 p.m.) [phone] (after hours)

## 2018-04-21 DIAGNOSIS — Z789 Other specified health status: Secondary | ICD-10-CM | POA: Insufficient documentation

## 2018-04-30 ENCOUNTER — Encounter
Admission: RE | Admit: 2018-04-30 | Discharge: 2018-04-30 | Disposition: A | Discharge status: Home / Self Care | Payer: Medicare Other | Source: Ambulatory Visit | Attending: Internal Medicine | Admitting: Internal Medicine

## 2018-05-04 ENCOUNTER — Non-Acute Institutional Stay (SKILLED_NURSING_FACILITY): Payer: Medicare Other | Admitting: Adult Health

## 2018-05-04 ENCOUNTER — Encounter: Payer: Self-pay | Admitting: Adult Health

## 2018-05-04 DIAGNOSIS — E034 Atrophy of thyroid (acquired): Secondary | ICD-10-CM | POA: Diagnosis not present

## 2018-05-04 DIAGNOSIS — E785 Hyperlipidemia, unspecified: Secondary | ICD-10-CM

## 2018-05-04 DIAGNOSIS — R339 Retention of urine, unspecified: Secondary | ICD-10-CM

## 2018-05-04 DIAGNOSIS — F418 Other specified anxiety disorders: Secondary | ICD-10-CM | POA: Diagnosis not present

## 2018-05-04 DIAGNOSIS — I639 Cerebral infarction, unspecified: Secondary | ICD-10-CM

## 2018-05-04 DIAGNOSIS — I10 Essential (primary) hypertension: Secondary | ICD-10-CM

## 2018-05-04 NOTE — Progress Notes (Signed)
[facility] Room Number: 310B Place of Service:  SNF ([phone]) Provider:  Kenard Gower, NP  Patient Care Team: Lauro Regulus, MD as PCP - General (Internal Medicine) Antonieta Iba, MD as Consulting Physician (Cardiology)  Extended Emergency Contact Information Primary Emergency Contact: Freda Munro Address: [address]          [place], [place] [address] Darden [place] of [place] Home Phone: [phone] Mobile Phone: [phone] Relation: Spouse Secondary Emergency Contact: Burman Foster [place] Home Phone: [phone] Mobile Phone: [phone] Relation: Daughter  Code Status:  FULL CODE  Goals of care: Advanced Directive information Advanced Directives 05/04/2018  Does Patient Have a Medical Advance Directive? No  Type of Advance Directive -  Does patient want to make changes to medical advance directive? -  Copy of Healthcare Power of Attorney in Chart? -  Would patient like information on creating a medical advance directive? No - Patient declined     Chief Complaint  Patient presents with  . Medical Management of Chronic Issues    Routine Visit    HPI:  Pt is a 82 y.o. female seen today for medical management of chronic diseases. She is a long-term care resident of [facility].  She has a PMH of  CVA, hypertension, and heart failure. She was seen in her room with sitter beside her. She was reported to be seen lying on the fall mat. She said that she was reaching for something and ended up on the fall mat. She denies having any pain.    Past Medical History:  Diagnosis Date  . Cerebral infarction (HCC)   . CKD (chronic kidney disease), stage III (HCC)   . Depression   . DJD (degenerative joint disease)   . Edema   . High cholesterol   . Hyperlipidemia, unspecified   . Hypertension   . Hypothyroidism   . Malaise   . Morbid obesity (HCC)   . Osteoarthritis   . Parathyroid  abnormality (HCC)   . Seizures (HCC)    a. following remote stroke.  . Stroke (HCC) 2002, 05/2017, 01/26/18, 01/30/18   a. 1964-->residual right arm wkns.  Previously on coumadin - pt says for just a few yrs.  . Subdural hematoma (HCC)    a. 10/2001 SDH req temporal frontoparietal craniotomy following fall. ? whether or not pt on coumadin @ time.  Notes indicate yes but pt denies.  . Thyroid disease   . TMJ (dislocation of temporomandibular joint)   . Tuberculosis    a. ~ 1950  . Urinary incontinence   . Weight loss    Past Surgical History:  Procedure Laterality Date  . ABDOMINAL HYSTERECTOMY    . BACK SURGERY    . BRAIN SURGERY    . cataract surgery     2014  . LEFT HEART CATH AND CORONARY ANGIOGRAPHY N/A 06/13/2017   Procedure: LEFT HEART CATH AND CORONARY ANGIOGRAPHY;  Surgeon: Iran Ouch, MD;  Location: [facility] INVASIVE CV LAB;  Service: Cardiovascular;  Laterality: N/A;  . PARATHYROID ADENOMA REMOVAL    . TEE WITHOUT CARDIOVERSION N/A 01/31/2018   Procedure: TRANSESOPHAGEAL ECHOCARDIOGRAM (TEE);  Surgeon: Yvonne Kendall, MD;  Location: [facility] ORS;  Service: Cardiovascular;  Laterality: N/A;  . TEE WITHOUT CARDIOVERSION N/A 02/01/2018   Procedure: TRANSESOPHAGEAL ECHOCARDIOGRAM (TEE);  Surgeon: Antonieta Iba, MD;  Location: [facility] ORS;  Service: Cardiovascular;  Laterality: N/A;    No Known Allergies  Outpatient Encounter Medications as  of 05/04/2018  Medication Sig  . acetaminophen (TYLENOL) 325 MG tablet Take 650 mg by mouth every 4 (four) hours as needed for mild pain or moderate pain.   Marland Kitchen aspirin 325 MG tablet Take 1 tablet (325 mg total) by mouth daily.  Marland Kitchen atorvastatin (LIPITOR) 80 MG tablet Take 1 tablet (80 mg total) by mouth daily at 6 PM.  . citalopram (CELEXA) 20 MG tablet Take 20 mg by mouth daily.  . cloNIDine (CATAPRES - DOSED IN MG/24 HR) 0.3 mg/24hr patch Place 1 patch onto the skin every Friday.   . clopidogrel (PLAVIX) 75 MG tablet Take 1 tablet (75 mg total)  by mouth daily.  . furosemide (LASIX) 20 MG tablet Take 20 mg by mouth daily as needed. As needed for edema. Take KCL with it  . latanoprost (XALATAN) 0.005 % ophthalmic solution Place 1 drop into both eyes at bedtime.   Marland Kitchen levothyroxine (SYNTHROID, LEVOTHROID) 88 MCG tablet Take 1 tablet by mouth daily.   Marland Kitchen lisinopril-hydrochlorothiazide (PRINZIDE,ZESTORETIC) 20-25 MG tablet Take 1 tablet by mouth daily.   Marland Kitchen LORazepam (ATIVAN) 0.5 MG tablet Take 1 tablet (0.5 mg total) by mouth 2 (two) times daily. In morning and at 2 pm for anxiety and agitation  . mirtazapine (REMERON) 45 MG tablet Take 45 mg by mouth at bedtime.   . Nutritional Supplements (NUTRITIONAL SUPPLEMENT PO) Diet Type: Heart healthy  . polyethylene glycol (MIRALAX / GLYCOLAX) packet Take 17 g by mouth daily as needed for mild constipation.  . potassium chloride SA (K-DUR,KLOR-CON) 20 MEQ tablet Take 20 mEq by mouth daily as needed. Take with Lasix for edema  . tamsulosin (FLOMAX) 0.4 MG CAPS capsule Take 1 capsule (0.4 mg total) by mouth daily.  . [DISCONTINUED] furosemide (LASIX) 20 MG tablet Take 1 tablet (20 mg total) by mouth daily as needed for fluid or edema. (Patient taking differently: Take 20 mg by mouth daily as needed for fluid or edema. Take KCL with it)  . [DISCONTINUED] KLOR-CON M20 20 MEQ tablet Take 1 tablet (20 mEq total) by mouth daily as needed. While on lasix  . [DISCONTINUED] VIIBRYD 40 MG TABS Take 40 mg by mouth every morning.   No facility-administered encounter medications on file as of 05/04/2018.     Review of Systems  Unable to obtain due to confusion/stroke   Immunization History  Administered Date(s) Administered  . Influenza Split 07/18/2014, 07/23/2015  . Influenza, High Dose Seasonal PF 06/14/2017  . Influenza-Unspecified 05/25/2012, 05/31/2016  . Pneumococcal Polysaccharide-23 08/30/2004, 08/04/2017   Pertinent  Health Maintenance Due  Topic Date Due  . DEXA SCAN  08/03/2003  . INFLUENZA  VACCINE  03/30/2018  . PNA vac Low Risk Adult (2 of 2 - PCV13) 08/04/2018    Vitals:   05/04/18 1403  BP: (!) 158/73  Pulse: 75  Resp: 20  Temp: 98.4 F (36.9 C)  TempSrc: Oral  SpO2: 97%  Weight: 216 lb 3.2 oz (98.1 kg)  Height: 5\' 1"  (1.549 m)   Body mass index is 40.85 kg/m.  Physical Exam  GENERAL APPEARANCE: Well nourished. In no acute distress. Morbidly obese SKIN:  Skin is warm and dry.  MOUTH and THROAT: Lips are without lesions. Oral mucosa is moist and without lesions.  RESPIRATORY: Breathing is even & unlabored, BS CTAB CARDIAC: RRR, no murmur,no extra heart sounds, no edema GI: Abdomen soft, normal BS, no masses, no tenderness EXTREMITIES:  Able to move X 4 extremities NEUROLOGICAL: Right-sided weakness PSYCHIATRIC: Alert to self,  disoriented to time and place. Affect and behavior are appropriate   Labs reviewed: Recent Labs    01/28/18 0414 01/29/18 0436 02/12/18 1019  NA 138 141 140  K 3.3* 3.9 3.1*  CL 103 107 101  CO2 26 27 31   GLUCOSE 120* 120* 141*  BUN 19 21* 26*  CREATININE 1.00 1.14* 1.03*  CALCIUM 9.1 9.1 9.8   Recent Labs    06/10/17 0845 01/26/18 1510 02/12/18 1019  AST 65* 24 28  ALT 46 13* 21  ALKPHOS 57 84 73  BILITOT 0.7 0.9 0.6  PROT 7.6 8.2* 7.3  ALBUMIN 3.5 4.8 3.7   Recent Labs    01/26/18 1510 01/31/18 0430 02/12/18 1018  WBC 6.1 8.2 8.2  NEUTROABS 4.4  --   --   HGB 14.4 12.9 13.1  HCT 42.4 38.5 38.9  MCV 95.1 94.0 94.1  PLT 114* 110* 235    Lab Results  Component Value Date   HGBA1C 5.6 01/27/2018   Lab Results  Component Value Date   CHOL 149 01/28/2018   HDL 40 (L) 01/28/2018   LDLCALC 81 01/28/2018   TRIG 139 01/28/2018   CHOLHDL 3.7 01/28/2018    Assessment/Plan  1. Cerebrovascular accident (CVA), unspecified mechanism (HCC) - stable, continue aspirin EC 25 mg 1 tab daily, atorvastatin 80 mg 1 tab daily, clonidine 0.3 mg/24 1 patch  transdermal Q Fridays, Plavix 75 mg 1 tab daily, continue PT  and OT for therapeutic strengthening exercises   2. Hypothyroidism due to acquired atrophy of thyroid - continue levothyroxine 88 g 1 tab daily   3. Dyslipidemia - continue atorvastatin 80 mg 1 tab daily Lab Results  Component Value Date   CHOL 149 01/28/2018   HDL 40 (L) 01/28/2018   LDLCALC 81 01/28/2018   TRIG 139 01/28/2018   CHOLHDL 3.7 01/28/2018     4. Depression with anxiety - mood is stable, ontinue mirtazapine 45 mg 1 tab daily at bedtime, celexa 20 mg 1 tab daily, and ativan 0.5 mg 1 tab twice a day   5. Urinary retention - continue tamsulosin 0.4 mg 1 capsule daily   6. Essential hypertension - well-controlled, continue lisinopril-hydrochlorothiazide 20-25 mg 1 tab daily and clonidine 0.3 mg/24 1 patch  transdermal Q Fridays    Family/ staff Communication: Discussed plan of care with charge nurse.  Labs/tests ordered:  None  Goals of care:   Long term care   Kenard Gower, NP [facility] and Adult Medicine [phone] (Monday-Friday 8:00 a.m. - 5:00 p.m.) [phone] (after hours)

## 2018-05-08 ENCOUNTER — Other Ambulatory Visit: Payer: Self-pay

## 2018-05-08 MED ORDER — LORAZEPAM 0.5 MG PO TABS: 0.5000 mg | ORAL_TABLET | Freq: Two times a day (BID) | ORAL | 0 refills | Status: DC

## 2018-05-08 NOTE — Telephone Encounter (Signed)
Rx sent to [facility] phone : [phone] , fax : [phone]  

## 2018-05-12 ENCOUNTER — Other Ambulatory Visit
Admission: RE | Admit: 2018-05-12 | Discharge: 2018-05-12 | Disposition: A | Discharge status: Home / Self Care | Payer: Medicare Other | Source: Ambulatory Visit | Attending: Internal Medicine | Admitting: Internal Medicine

## 2018-05-12 DIAGNOSIS — F419 Anxiety disorder, unspecified: Secondary | ICD-10-CM | POA: Diagnosis present

## 2018-05-12 LAB — CBC
HCT: 35.4 % (ref 35.0–47.0)
Hemoglobin: 11.8 g/dL — ABNORMAL LOW (ref 12.0–16.0)
MCH: 30.8 pg (ref 26.0–34.0)
MCHC: 33.4 g/dL (ref 32.0–36.0)
MCV: 92.3 fL (ref 80.0–100.0)
Platelets: 117 10*3/uL — ABNORMAL LOW (ref 150–440)
RBC: 3.83 MIL/uL (ref 3.80–5.20)
RDW: 14.2 % (ref 11.5–14.5)
WBC: 5.1 10*3/uL (ref 3.6–11.0)

## 2018-05-12 LAB — COMPREHENSIVE METABOLIC PANEL
ALK PHOS: 69 U/L (ref 38–126)
ALT: 10 U/L (ref 0–44)
AST: 16 U/L (ref 15–41)
Albumin: 3.3 g/dL — ABNORMAL LOW (ref 3.5–5.0)
Anion gap: 8 (ref 5–15)
BUN: 25 mg/dL — AB (ref 8–23)
CALCIUM: 8.8 mg/dL — AB (ref 8.9–10.3)
CHLORIDE: 104 mmol/L (ref 98–111)
CO2: 30 mmol/L (ref 22–32)
CREATININE: 1.01 mg/dL — AB (ref 0.44–1.00)
GFR calc Af Amer: 60 mL/min — ABNORMAL LOW (ref >60)
GFR, EST NON AFRICAN AMERICAN: 52 mL/min — AB (ref >60)
Glucose, Bld: 93 mg/dL (ref 70–99)
Potassium: 3.5 mmol/L (ref 3.5–5.1)
Sodium: 142 mmol/L (ref 135–145)
Total Bilirubin: 0.6 mg/dL (ref 0.3–1.2)
Total Protein: 5.9 g/dL — ABNORMAL LOW (ref 6.5–8.1)

## 2018-05-12 LAB — VALPROIC ACID LEVEL: VALPROIC ACID LVL: 93 ug/mL (ref 50.0–100.0)

## 2018-05-15 LAB — VALPROIC ACID LEVEL, FREE: Valproic Acid, Free: 9.9 ug/mL (ref 6.0–22.0)

## 2018-05-30 ENCOUNTER — Other Ambulatory Visit
Admission: RE | Admit: 2018-05-30 | Discharge: 2018-05-30 | Disposition: A | Discharge status: Home / Self Care | Payer: Medicare Other | Source: Ambulatory Visit | Attending: Adult Health | Admitting: Adult Health

## 2018-05-30 ENCOUNTER — Encounter
Admission: RE | Admit: 2018-05-30 | Discharge: 2018-05-30 | Disposition: A | Discharge status: Home / Self Care | Payer: Medicare Other | Source: Ambulatory Visit | Attending: Internal Medicine | Admitting: Internal Medicine

## 2018-05-30 DIAGNOSIS — N189 Chronic kidney disease, unspecified: Secondary | ICD-10-CM | POA: Insufficient documentation

## 2018-05-30 DIAGNOSIS — I130 Hypertensive heart and chronic kidney disease with heart failure and stage 1 through stage 4 chronic kidney disease, or unspecified chronic kidney disease: Secondary | ICD-10-CM | POA: Diagnosis not present

## 2018-05-30 LAB — VITAMIN B12: Vitamin B-12: 279 pg/mL (ref 180–914)

## 2018-05-31 ENCOUNTER — Non-Acute Institutional Stay (SKILLED_NURSING_FACILITY): Payer: Medicare Other | Admitting: Adult Health

## 2018-05-31 ENCOUNTER — Encounter: Payer: Self-pay | Admitting: Adult Health

## 2018-05-31 DIAGNOSIS — F320 Major depressive disorder, single episode, mild: Secondary | ICD-10-CM

## 2018-05-31 DIAGNOSIS — F39 Unspecified mood [affective] disorder: Secondary | ICD-10-CM | POA: Diagnosis not present

## 2018-05-31 DIAGNOSIS — Z8673 Personal history of transient ischemic attack (TIA), and cerebral infarction without residual deficits: Secondary | ICD-10-CM

## 2018-05-31 DIAGNOSIS — E034 Atrophy of thyroid (acquired): Secondary | ICD-10-CM | POA: Diagnosis not present

## 2018-05-31 DIAGNOSIS — I10 Essential (primary) hypertension: Secondary | ICD-10-CM

## 2018-05-31 NOTE — Progress Notes (Signed)
[facility]  [facility] Room Number: 310-B Place of Service:  SNF (31) Provider:  Kenard Gower, NP  Patient Care Team: Lauro Regulus, MD as PCP - General (Internal Medicine) Antonieta Iba, MD as Consulting Physician (Cardiology)  Extended Emergency Contact Information Primary Emergency Contact: Freda Munro Address: [address]          [place], [place] [address] [place] of [place] Home Phone: [phone] Mobile Phone: [phone] Relation: Spouse Secondary Emergency Contact: Burman Foster [place] Home Phone: [phone] Mobile Phone: [phone] Relation: Daughter  Code Status:  Full Code  Goals of care: Advanced Directive information Advanced Directives 05/04/2018  Does Patient Have a Medical Advance Directive? No  Type of Advance Directive -  Does patient want to make changes to medical advance directive? -  Copy of Healthcare Power of Attorney in Chart? -  Would patient like information on creating a medical advance directive? No - Patient declined     Chief Complaint  Patient presents with  . Medical Management of Chronic Issues    Routine [facility] visit    HPI:  Pt is a 82 y.o. female seen today for medical management of chronic diseases.  She is a long-term care resident at [facility].  She has a PMH of CVA, hypertension, and heart failure. She was seen by the dining room watching television. No reported agitation. Bps has been stable.   Past Medical History:  Diagnosis Date  . Cerebral infarction (HCC)   . CKD (chronic kidney disease), stage III (HCC)   . Depression   . DJD (degenerative joint disease)   . Edema   . High cholesterol   . Hyperlipidemia, unspecified   . Hypertension   . Hypothyroidism   . Malaise   . Morbid obesity (HCC)   . Osteoarthritis   . Parathyroid abnormality (HCC)   . Seizures (HCC)    a. following remote stroke.  . Stroke  (HCC) 2002, 05/2017, 01/26/18, 01/30/18   a. 1964-->residual right arm wkns.  Previously on coumadin - pt says for just a few yrs.  . Subdural hematoma (HCC)    a. 10/2001 SDH req temporal frontoparietal craniotomy following fall. ? whether or not pt on coumadin @ time.  Notes indicate yes but pt denies.  . Thyroid disease   . TMJ (dislocation of temporomandibular joint)   . Tuberculosis    a. ~ 1950  . Urinary incontinence   . Weight loss    Past Surgical History:  Procedure Laterality Date  . ABDOMINAL HYSTERECTOMY    . BACK SURGERY    . BRAIN SURGERY    . cataract surgery     2014  . LEFT HEART CATH AND CORONARY ANGIOGRAPHY N/A 06/13/2017   Procedure: LEFT HEART CATH AND CORONARY ANGIOGRAPHY;  Surgeon: Iran Ouch, MD;  Location: [facility] INVASIVE CV LAB;  Service: Cardiovascular;  Laterality: N/A;  . PARATHYROID ADENOMA REMOVAL    . TEE WITHOUT CARDIOVERSION N/A 01/31/2018   Procedure: TRANSESOPHAGEAL ECHOCARDIOGRAM (TEE);  Surgeon: Yvonne Kendall, MD;  Location: [facility] ORS;  Service: Cardiovascular;  Laterality: N/A;  . TEE WITHOUT CARDIOVERSION N/A 02/01/2018   Procedure: TRANSESOPHAGEAL ECHOCARDIOGRAM (TEE);  Surgeon: Antonieta Iba, MD;  Location: [facility] ORS;  Service: Cardiovascular;  Laterality: N/A;    No Known Allergies  Outpatient Encounter Medications as of 05/31/2018  Medication Sig  . acetaminophen (TYLENOL) 325 MG tablet Take 650 mg by mouth every 4 (four) hours as  needed for mild pain or moderate pain.   Marland Kitchen aspirin 325 MG tablet Take 1 tablet (325 mg total) by mouth daily.  Marland Kitchen atorvastatin (LIPITOR) 80 MG tablet Take 1 tablet (80 mg total) by mouth daily at 6 PM.  . citalopram (CELEXA) 20 MG tablet Take 20 mg by mouth daily.  . cloNIDine (CATAPRES - DOSED IN MG/24 HR) 0.3 mg/24hr patch Place 1 patch onto the skin every Friday.   . clopidogrel (PLAVIX) 75 MG tablet Take 1 tablet (75 mg total) by mouth daily.  . divalproex (DEPAKOTE) 250 MG DR tablet Take 250 mg by mouth  3 (three) times daily.  . furosemide (LASIX) 20 MG tablet Take 20 mg by mouth daily as needed. As needed for edema. Take KCL with it  . latanoprost (XALATAN) 0.005 % ophthalmic solution Place 1 drop into both eyes at bedtime.   Marland Kitchen levothyroxine (SYNTHROID, LEVOTHROID) 88 MCG tablet Take 1 tablet by mouth daily.   Marland Kitchen lisinopril-hydrochlorothiazide (PRINZIDE,ZESTORETIC) 20-25 MG tablet Take 1 tablet by mouth daily.   Marland Kitchen LORazepam (ATIVAN) 0.5 MG tablet Take 0.5 mg by mouth daily as needed for anxiety.  . mirtazapine (REMERON) 45 MG tablet Take 45 mg by mouth at bedtime.   . Nutritional Supplements (NUTRITIONAL SUPPLEMENT PO) Diet Type: Heart healthy  . OLANZapine (ZYPREXA) 2.5 MG tablet Take 2.5 mg by mouth daily as needed.  . polyethylene glycol (MIRALAX / GLYCOLAX) packet Take 17 g by mouth daily as needed for mild constipation.  . potassium chloride SA (K-DUR,KLOR-CON) 20 MEQ tablet Take 20 mEq by mouth daily as needed. Take with Lasix for edema  . tamsulosin (FLOMAX) 0.4 MG CAPS capsule Take 1 capsule (0.4 mg total) by mouth daily.  . [DISCONTINUED] citalopram (CELEXA) 20 MG tablet Take 20 mg by mouth daily.   . [DISCONTINUED] LORazepam (ATIVAN) 0.5 MG tablet Take 1 tablet (0.5 mg total) by mouth 2 (two) times daily. In morning and at 2 pm for anxiety and agitation   No facility-administered encounter medications on file as of 05/31/2018.     Review of Systems  Unable to obtain due to cognitive deficit.    Immunization History  Administered Date(s) Administered  . Influenza Split 07/18/2014, 07/23/2015  . Influenza, High Dose Seasonal PF 06/14/2017  . Influenza-Unspecified 05/25/2012, 05/31/2016  . Pneumococcal Polysaccharide-23 08/30/2004, 08/04/2017   Pertinent  Health Maintenance Due  Topic Date Due  . DEXA SCAN  08/03/2003  . INFLUENZA VACCINE  03/30/2018  . PNA vac Low Risk Adult (2 of 2 - PCV13) 08/04/2018      Vitals:   05/31/18 0929  BP: (!) 121/56  Pulse: 74  Resp: 20    Temp: 98.4 F (36.9 C)  TempSrc: Oral  SpO2: 95%  Weight: 215 lb 8 oz (97.8 kg)  Height: 5\' 1"  (1.549 m)   Body mass index is 40.72 kg/m.  Physical Exam  GENERAL APPEARANCE: Well nourished. In no acute distress. Morbidly obese SKIN:  Skin is warm and dry.  MOUTH and THROAT: Lips are without lesions. Oral mucosa is moist and without lesions. Tongue is normal in shape, size, and color and without lesions RESPIRATORY: Breathing is even & unlabored, BS CTAB CARDIAC: RRR, no murmur,no extra heart sounds, no edema GI: Abdomen soft, normal BS, no masses, no tenderness EXTREMITIES:  Able to move X 4 extremities NEURO:  Right-sided weakness PSYCHIATRIC:  Affect and behavior are appropriate  Labs reviewed: Recent Labs    01/29/18 0436 02/12/18 1019 05/12/18 0625  NA  141 140 142  K 3.9 3.1* 3.5  CL 107 101 104  CO2 27 31 30   GLUCOSE 120* 141* 93  BUN 21* 26* 25*  CREATININE 1.14* 1.03* 1.01*  CALCIUM 9.1 9.8 8.8*   Recent Labs    01/26/18 1510 02/12/18 1019 05/12/18 0625  AST 24 28 16   ALT 13* 21 10  ALKPHOS 84 73 69  BILITOT 0.9 0.6 0.6  PROT 8.2* 7.3 5.9*  ALBUMIN 4.8 3.7 3.3*   Recent Labs    01/26/18 1510 01/31/18 0430 02/12/18 1018 05/12/18 0625  WBC 6.1 8.2 8.2 5.1  NEUTROABS 4.4  --   --   --   HGB 14.4 12.9 13.1 11.8*  HCT 42.4 38.5 38.9 35.4  MCV 95.1 94.0 94.1 92.3  PLT 114* 110* 235 117*    Lab Results  Component Value Date   HGBA1C 5.6 01/27/2018   Lab Results  Component Value Date   CHOL 149 01/28/2018   HDL 40 (L) 01/28/2018   LDLCALC 81 01/28/2018   TRIG 139 01/28/2018   CHOLHDL 3.7 01/28/2018    Assessment/Plan  1. History of CVA (cerebrovascular accident) - stable, continue Plavix 75 mg 1 tab daily, lisinopril-hydrochlorothiazide 20-25 mg 1 tab daily, clonidine 0.3 mg / 24-hour transdermal q. Fridays, aspirin EC 325 mg 1 tab daily and atorvastatin 80 mg 1 tab daily, continue PT and OT for therapeutic and strengthening exercises,  fall precautions   2. Hypothyroidism due to acquired atrophy of thyroid -continue levothyroxine 88 mcg 1 tab daily   3. Essential hypertension -well-controlled, continue lisinopril-hydrochlorothiazide 20-25 mg 1 tab daily and clonidine 0.3 mg/24-hour 1 patch on Fridays   4. Mood disorder (HCC) -mood has been stable, continue Depakote DR 250 mg 1 tab 3 times daily, has a sitter in the morning   5. Depression, major, single episode, mild (HCC) -continue Celexa 20 mg 1 tab daily and mirtazapine 945 mg 1 tab at bedtime    Family/ staff Communication: Discussed plan of care with charge nurse.  Labs/tests ordered:  None  Goals of care:   Long-term care.   Kenard Gower, NP [facility] and Adult Medicine [phone] (Monday-Friday 8:00 a.m. - 5:00 p.m.) [phone] (after hours)

## 2018-06-09 ENCOUNTER — Other Ambulatory Visit
Admission: RE | Admit: 2018-06-09 | Discharge: 2018-06-09 | Disposition: A | Discharge status: Home / Self Care | Payer: Medicare Other | Source: Ambulatory Visit | Attending: Internal Medicine | Admitting: Internal Medicine

## 2018-06-09 DIAGNOSIS — F419 Anxiety disorder, unspecified: Secondary | ICD-10-CM | POA: Insufficient documentation

## 2018-06-09 LAB — CBC WITH DIFFERENTIAL/PLATELET
ABS IMMATURE GRANULOCYTES: 0.02 10*3/uL (ref 0.00–0.07)
BASOS ABS: 0 10*3/uL (ref 0.0–0.1)
Basophils Relative: 1 %
Eosinophils Absolute: 0.2 10*3/uL (ref 0.0–0.5)
Eosinophils Relative: 4 %
HCT: 34 % — ABNORMAL LOW (ref 36.0–46.0)
HEMOGLOBIN: 10.8 g/dL — AB (ref 12.0–15.0)
IMMATURE GRANULOCYTES: 0 %
LYMPHS PCT: 28 %
Lymphs Abs: 1.5 10*3/uL (ref 0.7–4.0)
MCH: 29.4 pg (ref 26.0–34.0)
MCHC: 31.8 g/dL (ref 30.0–36.0)
MCV: 92.6 fL (ref 80.0–100.0)
MONO ABS: 0.7 10*3/uL (ref 0.1–1.0)
Monocytes Relative: 13 %
NEUTROS ABS: 3 10*3/uL (ref 1.7–7.7)
NEUTROS PCT: 54 %
Platelets: 184 10*3/uL (ref 150–400)
RBC: 3.67 MIL/uL — AB (ref 3.87–5.11)
RDW: 13.2 % (ref 11.5–15.5)
WBC: 5.5 10*3/uL (ref 4.0–10.5)
nRBC: 0 % (ref 0.0–0.2)

## 2018-06-09 LAB — COMPREHENSIVE METABOLIC PANEL
ALBUMIN: 2.6 g/dL — AB (ref 3.5–5.0)
ALT: 10 U/L (ref 0–44)
AST: 15 U/L (ref 15–41)
Alkaline Phosphatase: 61 U/L (ref 38–126)
Anion gap: 10 (ref 5–15)
BUN: 22 mg/dL (ref 8–23)
CHLORIDE: 102 mmol/L (ref 98–111)
CO2: 31 mmol/L (ref 22–32)
Calcium: 8.6 mg/dL — ABNORMAL LOW (ref 8.9–10.3)
Creatinine, Ser: 0.98 mg/dL (ref 0.44–1.00)
GFR calc Af Amer: >60 mL/min (ref >60)
GFR, EST NON AFRICAN AMERICAN: 53 mL/min — AB (ref >60)
Glucose, Bld: 90 mg/dL (ref 70–99)
POTASSIUM: 4 mmol/L (ref 3.5–5.1)
Sodium: 143 mmol/L (ref 135–145)
Total Bilirubin: 0.3 mg/dL (ref 0.3–1.2)
Total Protein: 5.6 g/dL — ABNORMAL LOW (ref 6.5–8.1)

## 2018-06-12 LAB — VALPROIC ACID LEVEL, FREE: VALPROIC ACID FREE: 8.5 ug/mL (ref 6.0–22.0)

## 2018-06-30 ENCOUNTER — Encounter
Admission: RE | Admit: 2018-06-30 | Discharge: 2018-06-30 | Disposition: A | Discharge status: Home / Self Care | Payer: Medicare Other | Source: Ambulatory Visit | Attending: Internal Medicine | Admitting: Internal Medicine

## 2018-06-30 DIAGNOSIS — N189 Chronic kidney disease, unspecified: Secondary | ICD-10-CM | POA: Insufficient documentation

## 2018-06-30 DIAGNOSIS — I130 Hypertensive heart and chronic kidney disease with heart failure and stage 1 through stage 4 chronic kidney disease, or unspecified chronic kidney disease: Secondary | ICD-10-CM | POA: Insufficient documentation

## 2018-07-06 ENCOUNTER — Non-Acute Institutional Stay (SKILLED_NURSING_FACILITY): Payer: Medicare Other | Admitting: Adult Health

## 2018-07-06 ENCOUNTER — Encounter: Payer: Self-pay | Admitting: Adult Health

## 2018-07-06 DIAGNOSIS — F418 Other specified anxiety disorders: Secondary | ICD-10-CM | POA: Diagnosis not present

## 2018-07-06 DIAGNOSIS — E785 Hyperlipidemia, unspecified: Secondary | ICD-10-CM

## 2018-07-06 DIAGNOSIS — E034 Atrophy of thyroid (acquired): Secondary | ICD-10-CM | POA: Diagnosis not present

## 2018-07-06 DIAGNOSIS — H409 Unspecified glaucoma: Secondary | ICD-10-CM | POA: Diagnosis not present

## 2018-07-06 NOTE — Progress Notes (Signed)
[facility]:   [facility] Room Number: 310 B Place of Service:  SNF (31)   CODE STATUS: Full Code  No Known Allergies  Chief Complaint  Patient presents with  . Medical Management of Chronic Issues    Hypothyroidism due to acquired atrophy of thyroid; dyslipidemia; glaucoma of both eyes unspecified glaucoma type; depression with anxiety.     HPI:  She is 82 year old long term resident of this facility being seen for the management of her chronic illnesses; hypothyroidism; dyslipidemia; glaucoma depression. She is unable to participate in the hpi or ros. There are no reports of agitation present; no changes in appetite; no uncontrolled pain.   Past Medical History:  Diagnosis Date  . Cerebral infarction (HCC)   . CKD (chronic kidney disease), stage III (HCC)   . Depression   . DJD (degenerative joint disease)   . Edema   . High cholesterol   . Hyperlipidemia, unspecified   . Hypertension   . Hypothyroidism   . Malaise   . Morbid obesity (HCC)   . Osteoarthritis   . Parathyroid abnormality (HCC)   . Seizures (HCC)    a. following remote stroke.  . Stroke (HCC) 2002, 05/2017, 01/26/18, 01/30/18   a. 1964-->residual right arm wkns.  Previously on coumadin - pt says for just a few yrs.  . Subdural hematoma (HCC)    a. 10/2001 [facility] req temporal frontoparietal craniotomy following fall. ? whether or not pt on coumadin @ time.  Notes indicate yes but pt denies.  . Thyroid disease   . TMJ (dislocation of temporomandibular joint)   . Tuberculosis    a. ~ 1950  . Urinary incontinence   . Weight loss     Past Surgical History:  Procedure Laterality Date  . ABDOMINAL HYSTERECTOMY    . BACK SURGERY    . BRAIN SURGERY    . cataract surgery     2014  . LEFT HEART CATH AND CORONARY ANGIOGRAPHY N/A 06/13/2017   Procedure: LEFT HEART CATH AND CORONARY ANGIOGRAPHY;  Surgeon: Iran Ouch, MD;  Location: [facility] INVASIVE CV LAB;  Service: Cardiovascular;   Laterality: N/A;  . PARATHYROID ADENOMA REMOVAL    . TEE WITHOUT CARDIOVERSION N/A 01/31/2018   Procedure: TRANSESOPHAGEAL ECHOCARDIOGRAM (TEE);  Surgeon: Yvonne Kendall, MD;  Location: [facility] ORS;  Service: Cardiovascular;  Laterality: N/A;  . TEE WITHOUT CARDIOVERSION N/A 02/01/2018   Procedure: TRANSESOPHAGEAL ECHOCARDIOGRAM (TEE);  Surgeon: Antonieta Iba, MD;  Location: [facility] ORS;  Service: Cardiovascular;  Laterality: N/A;    Social History   Socioeconomic History  . Marital status: Married    Spouse name: Fayrene Fearing  . Number of children: 2  . Years of education: BS  . Highest education level: Not on file  Occupational History  . Not on file  Social Needs  . Financial resource strain: Not on file  . Food insecurity:    Worry: Not on file    Inability: Not on file  . Transportation needs:    Medical: Not on file    Non-medical: Not on file  Tobacco Use  . Smoking status: Never Smoker  . Smokeless tobacco: Never Used  Substance and Sexual Activity  . Alcohol use: No  . Drug use: No  . Sexual activity: Not on file  Lifestyle  . Physical activity:    Days per week: Not on file    Minutes per session: Not on file  . Stress: Not on file  Relationships  . Social connections:    Talks on phone: Not on file    Gets together: Not on file    Attends religious service: Not on file    Active member of club or organization: Not on file    Attends meetings of clubs or organizations: Not on file    Relationship status: Not on file  . Intimate partner violence:    Fear of current or ex partner: Not on file    Emotionally abused: Not on file    Physically abused: Not on file    Forced sexual activity: Not on file  Other Topics Concern  . Not on file  Social History Narrative   02/22/18 residing at [facility] in [place].  Active around the house.  Does not routinely exercise or drive.  Does own grocery shopping.    Family History  Problem Relation Age of Onset  . Heart failure Mother        died @ 3  . Heart attack Father        died @ 50  . Stroke Father   . Breast cancer Neg Hx       VITAL SIGNS BP 116/61   Pulse 70   Temp 98.3 F (36.8 C)   Resp 20   Ht 5\' 1"  (1.549 m)   Wt 206 lb 4.8 oz (93.6 kg)   SpO2 97%   BMI 38.98 kg/m   Outpatient Encounter Medications as of 07/06/2018  Medication Sig  . acetaminophen (TYLENOL) 325 MG tablet Take 650 mg by mouth every 4 (four) hours as needed for mild pain or moderate pain.   Marland Kitchen aspirin 325 MG tablet Take 1 tablet (325 mg total) by mouth daily.  Marland Kitchen atorvastatin (LIPITOR) 80 MG tablet Take 1 tablet (80 mg total) by mouth daily at 6 PM.  . citalopram (CELEXA) 20 MG tablet Take 20 mg by mouth daily.  . cloNIDine (CATAPRES - DOSED IN MG/24 HR) 0.3 mg/24hr patch Place 1 patch onto the skin every Friday.   . clopidogrel (PLAVIX) 75 MG tablet Take 1 tablet (75 mg total) by mouth daily.  . cyanocobalamin 1000 MCG tablet Take 1,000 mcg by mouth daily.  . divalproex (DEPAKOTE) 250 MG DR tablet Take 250 mg by mouth 3 (three) times daily.  . furosemide (LASIX) 20 MG tablet Take 20 mg by mouth daily as needed. As needed for edema. Take KCL with it  . latanoprost (XALATAN) 0.005 % ophthalmic solution Place 1 drop into both eyes at bedtime.   Marland Kitchen levothyroxine (SYNTHROID, LEVOTHROID) 88 MCG tablet Take 1 tablet by mouth daily.   Marland Kitchen lisinopril-hydrochlorothiazide (PRINZIDE,ZESTORETIC) 20-25 MG tablet Take 1 tablet by mouth daily.   . mirtazapine (REMERON) 45 MG tablet Take 45 mg by mouth at bedtime.   . Nutritional Supplements (NUTRITIONAL SUPPLEMENT PO) Diet Type: Heart healthy  . polyethylene glycol (MIRALAX / GLYCOLAX) packet Take 17 g by mouth daily as needed for mild constipation.  . potassium chloride SA (K-DUR,KLOR-CON) 20 MEQ tablet Take 20 mEq by mouth daily as needed. Take with Lasix for edema  . tamsulosin (FLOMAX) 0.4 MG CAPS capsule Take 1  capsule (0.4 mg total) by mouth daily.  . [DISCONTINUED] OLANZapine (ZYPREXA) 2.5 MG tablet Take 2.5 mg by mouth daily as needed.   No facility-administered encounter medications on file as of 07/06/2018.      SIGNIFICANT DIAGNOSTIC EXAMS  LABS REVIEWED:  01-27-18: hgb a1c 5.6  01-28-18: chol 149; ldl 81; trig 139; hdl 40  6-57-84: wbc 8.2; hgb 13.1; hct 38.9; mcv 94.1; plt 235; glucose 141; bun 26; creat 1.03; k+ 3.1; na++ 140 ca 9.8 liver normal albumin 9.8 urine culture: 50,000 lactobacillus  TODAY:     05-12-18: wbc 5.1; hgb 11.8; hct 35.4; mcv 92.3; plt 117; glucose 93; bun 25; creat 1.01; k+ 3.5; na++ 142; ca 8.8; liver normal albumin 3.3 depakote 93 05-30-18: vit B 12; 279   Review of Systems  Unable to perform ROS: Dementia (unable to participate )    Physical Exam  Constitutional: She appears well-developed and well-nourished. No distress.  Neck: No thyromegaly present.  Cardiovascular: Normal rate, regular rhythm, normal heart sounds and intact distal pulses.  Pulmonary/Chest: Effort normal and breath sounds normal. No respiratory distress.  Abdominal: Soft. Bowel sounds are normal. She exhibits no distension. There is no tenderness.  Musculoskeletal: She exhibits no edema.  Has right side weakness Slight right facial droop  Is able to move all extremities    Lymphadenopathy:    She has no cervical adenopathy.  Neurological: She is alert.  Skin: Skin is warm and dry. She is not diaphoretic.  Psychiatric: She has a normal mood and affect.      ASSESSMENT/ PLAN:  TODAY;   1. Dyslipidemia:  Is stable ldl 81 will continue lipitor 80 mg daily  2. Hypothyroidism due to atrophy of thyroid: is stable will continue synthroid 88 mcg daily   3. Bilateral glaucoma: is stable will continue xalatan to both eyes nightly   4. Depression with anxiety: is stable: will continue remeron 45 mg nightly  Will continue depakote 250 mg three times daily to stabilize mood  PREVIOUS    5. Chronic retention of urine: is stable will continue flomax 0.4 mg daily   6.  Acute CVA: is neurologically stable; will continue asa 325 mg daily and plavix 75 mg daily  7.  Chronic diastolic heart failure: is stable will continue lasix 20 mg with k+ 20 meq daily as needed for edema  8. Hypertensive heart disease with chronic diastolic heart failure: is stable b/p 116/61: will continue clonidine 0.3 mg weekly patch; lisinopril hct 20-25 mg daily   9.  Chronic kidney disease stage III: is stable bun 25; creat 1.01    MD is aware of resident's narcotic use and is in agreement with current plan of care. We will attempt to wean resident as apropriate   Synthia Innocent NP [facility]  Contact [phone] Monday through Friday 8am- 5pm  After hours call [phone]

## 2018-07-30 ENCOUNTER — Encounter
Admission: RE | Admit: 2018-07-30 | Discharge: 2018-07-30 | Disposition: A | Discharge status: Home / Self Care | Payer: Medicare Other | Source: Ambulatory Visit | Attending: Internal Medicine | Admitting: Internal Medicine

## 2018-07-30 DIAGNOSIS — I130 Hypertensive heart and chronic kidney disease with heart failure and stage 1 through stage 4 chronic kidney disease, or unspecified chronic kidney disease: Secondary | ICD-10-CM | POA: Insufficient documentation

## 2018-07-30 DIAGNOSIS — N189 Chronic kidney disease, unspecified: Secondary | ICD-10-CM | POA: Insufficient documentation

## 2018-08-04 ENCOUNTER — Encounter: Payer: Self-pay | Admitting: Adult Health

## 2018-08-04 ENCOUNTER — Non-Acute Institutional Stay (SKILLED_NURSING_FACILITY): Payer: Medicare Other | Admitting: Adult Health

## 2018-08-04 DIAGNOSIS — I5032 Chronic diastolic (congestive) heart failure: Secondary | ICD-10-CM

## 2018-08-04 DIAGNOSIS — R339 Retention of urine, unspecified: Secondary | ICD-10-CM

## 2018-08-04 DIAGNOSIS — I6930 Unspecified sequelae of cerebral infarction: Secondary | ICD-10-CM

## 2018-08-04 NOTE — Progress Notes (Signed)
[facility]:   [facility] Room Number: 310 B Place of Service:  SNF (31)   CODE STATUS: Full code  No Known Allergies  Chief Complaint  Patient presents with  . Medical Management of Chronic Issues    Chronic cerebrovascular accident; chronic diastolic congestive heart failure; chronic retention of urine.     HPI:  She is a 82 year old long term resident of this facility being seen for the management of her chronic illnesses: cva; chf; urine retention. She is unable to fully participate in the hpi or ros. There are no reports of agitation; no changes in appetite; no uncontrolled pain; no anxiety.   Past Medical History:  Diagnosis Date  . Cerebral infarction (HCC)   . CKD (chronic kidney disease), stage III (HCC)   . Depression   . DJD (degenerative joint disease)   . Edema   . High cholesterol   . Hyperlipidemia, unspecified   . Hypertension   . Hypothyroidism   . Malaise   . Morbid obesity (HCC)   . Osteoarthritis   . Parathyroid abnormality (HCC)   . Seizures (HCC)    a. following remote stroke.  . Stroke (HCC) 2002, 05/2017, 01/26/18, 01/30/18   a. 1964-->residual right arm wkns.  Previously on coumadin - pt says for just a few yrs.  . Subdural hematoma (HCC)    a. 10/2001 [facility] req temporal frontoparietal craniotomy following fall. ? whether or not pt on coumadin @ time.  Notes indicate yes but pt denies.  . Thyroid disease   . TMJ (dislocation of temporomandibular joint)   . Tuberculosis    a. ~ 1950  . Urinary incontinence   . Weight loss     Past Surgical History:  Procedure Laterality Date  . ABDOMINAL HYSTERECTOMY    . BACK SURGERY    . BRAIN SURGERY    . cataract surgery     2014  . LEFT HEART CATH AND CORONARY ANGIOGRAPHY N/A 06/13/2017   Procedure: LEFT HEART CATH AND CORONARY ANGIOGRAPHY;  Surgeon: Iran Ouch, MD;  Location: [facility] INVASIVE CV LAB;  Service: Cardiovascular;  Laterality: N/A;  . PARATHYROID ADENOMA REMOVAL     . TEE WITHOUT CARDIOVERSION N/A 01/31/2018   Procedure: TRANSESOPHAGEAL ECHOCARDIOGRAM (TEE);  Surgeon: Yvonne Kendall, MD;  Location: [facility] ORS;  Service: Cardiovascular;  Laterality: N/A;  . TEE WITHOUT CARDIOVERSION N/A 02/01/2018   Procedure: TRANSESOPHAGEAL ECHOCARDIOGRAM (TEE);  Surgeon: Antonieta Iba, MD;  Location: [facility] ORS;  Service: Cardiovascular;  Laterality: N/A;    Social History   Socioeconomic History  . Marital status: Married    Spouse name: Fayrene Fearing  . Number of children: 2  . Years of education: BS  . Highest education level: Not on file  Occupational History  . Not on file  Social Needs  . Financial resource strain: Not on file  . Food insecurity:    Worry: Not on file    Inability: Not on file  . Transportation needs:    Medical: Not on file    Non-medical: Not on file  Tobacco Use  . Smoking status: Never Smoker  . Smokeless tobacco: Never Used  Substance and Sexual Activity  . Alcohol use: No  . Drug use: No  . Sexual activity: Not on file  Lifestyle  . Physical activity:    Days per week: Not on file    Minutes per session: Not on file  . Stress: Not on file  Relationships  .  Social connections:    Talks on phone: Not on file    Gets together: Not on file    Attends religious service: Not on file    Active member of club or organization: Not on file    Attends meetings of clubs or organizations: Not on file    Relationship status: Not on file  . Intimate partner violence:    Fear of current or ex partner: Not on file    Emotionally abused: Not on file    Physically abused: Not on file    Forced sexual activity: Not on file  Other Topics Concern  . Not on file  Social History Narrative   [facility] [place]   [place].  Active around the house.  Does not routinely exercise or drive.  Does own grocery shopping.   Family History  Problem Relation Age of Onset    . Heart failure Mother        died @ 3788  . Heart attack Father        died @ 4988  . Stroke Father   . Breast cancer Neg Hx       VITAL SIGNS BP 116/60   Pulse 74   Temp 97.8 F (36.6 C)   Resp 16   Ht 5\' 1"  (1.549 m)   Wt 206 lb 4.8 oz (93.6 kg)   SpO2 94%   BMI 38.98 kg/m   Outpatient Encounter Medications as of 08/04/2018  Medication Sig  . acetaminophen (TYLENOL) 325 MG tablet Take 650 mg by mouth every 4 (four) hours as needed for mild pain or moderate pain.   Marland Kitchen. aspirin 325 MG tablet Take 1 tablet (325 mg total) by mouth daily.  Marland Kitchen. atorvastatin (LIPITOR) 80 MG tablet Take 1 tablet (80 mg total) by mouth daily at 6 PM.  . citalopram (CELEXA) 20 MG tablet Take 20 mg by mouth daily.  . cloNIDine (CATAPRES - DOSED IN MG/24 HR) 0.3 mg/24hr patch Place 1 patch onto the skin every Friday.   . clopidogrel (PLAVIX) 75 MG tablet Take 1 tablet (75 mg total) by mouth daily.  . cyanocobalamin 1000 MCG tablet Take 1,000 mcg by mouth daily.  . divalproex (DEPAKOTE) 250 MG DR tablet Take 250 mg by mouth 3 (three) times daily.  Marland Kitchen. latanoprost (XALATAN) 0.005 % ophthalmic solution Place 1 drop into both eyes at bedtime.   Marland Kitchen. levothyroxine (SYNTHROID, LEVOTHROID) 88 MCG tablet Take 1 tablet by mouth daily.   Marland Kitchen. lisinopril-hydrochlorothiazide (PRINZIDE,ZESTORETIC) 20-25 MG tablet Take 1 tablet by mouth daily.   . mirtazapine (REMERON) 45 MG tablet Take 45 mg by mouth at bedtime.   . Nutritional Supplements (NUTRITIONAL SUPPLEMENT PO) Diet Type: Heart healthy  . polyethylene glycol (MIRALAX / GLYCOLAX) packet Take 17 g by mouth daily as needed for mild constipation.  . tamsulosin (FLOMAX) 0.4 MG CAPS capsule Take 1 capsule (0.4 mg total) by mouth daily.  . [DISCONTINUED] furosemide (LASIX) 20 MG tablet Take 20 mg by mouth daily as needed. As needed for edema. Take KCL with it   No facility-administered encounter medications on file as of 08/04/2018.      SIGNIFICANT DIAGNOSTIC EXAMS  LABS  REVIEWED:   01-27-18: hgb a1c 5.6  01-28-18: chol 149; ldl 81; trig 139; hdl 40  [id number]: wbc 8.2; hgb 13.1; hct 38.9; mcv 94.1; plt 235; glucose 141; bun 26; creat 1.03; k+ 3.1; na++ 140 ca 9.8  liver normal albumin 9.8 urine culture: 50,000 lactobacillus  05-12-18: wbc 5.1; hgb 11.8; hct 35.4; mcv 92.3; plt 117; glucose 93; bun 25; creat 1.01; k+ 3.5; na++ 142; ca 8.8; liver normal albumin 3.3 depakote 93 05-30-18: vit B 12; 279  TODAY:   06-09-18: wbc 5.5; hgb 10.8; hct 34.0; mcv 92.6; plt 184; glucose 90; bun 22; creat 0.98; k+ 4.0; na++ 143; ca 8.6  Liver normal albumin 2.6    Review of Systems  Unable to perform ROS: Dementia (confusion )    Physical Exam  Constitutional: She appears well-developed and well-nourished. No distress.  Neck: No thyromegaly present.  Cardiovascular: Normal rate, regular rhythm, normal heart sounds and intact distal pulses.  Pulmonary/Chest: Effort normal and breath sounds normal. No respiratory distress.  Abdominal: Soft. Bowel sounds are normal. She exhibits no distension. There is no tenderness.  Musculoskeletal: She exhibits no edema.  Has right side weakness Slight right facial droop  Is able to move all extremities    Lymphadenopathy:    She has no cervical adenopathy.  Neurological: She is alert.  Skin: Skin is warm and dry. She is not diaphoretic.  Psychiatric: She has a normal mood and affect.    ASSESSMENT/ PLAN:  TODAY;   1. Chronic retention of urine: is stable will continue flomax 0.4 mg daily   2.  Chronic cerebrovascular accident: is neurologically stable; will continue asa 325 mg daily and plavix 75 mg daily  3.  Chronic diastolic heart failure: is stable will continue to monitor her status.   PREVIOUS   4. Hypertensive heart disease with chronic diastolic heart failure: is stable b/p 116/60: will continue clonidine 0.3 mg weekly patch; lisinopril hct 20-25 mg daily   5.  Chronic kidney disease stage III: is stable bun 22;  creat 0.98  6. Dyslipidemia:  Is stable ldl 81 will continue lipitor 80 mg daily  7. Hypothyroidism due to atrophy of thyroid: is stable will continue synthroid 88 mcg daily   8. Bilateral glaucoma: is stable will continue xalatan to both eyes nightly   9. Depression with anxiety: is stable: will continue remeron 45 mg nightly  Will continue depakote 250 mg three times daily to stabilize mood   MD is aware of resident's narcotic use and is in agreement with current plan of care. We will attempt to wean resident as apropriate   Synthia Innocent NP [facility]  Contact [phone] Monday through Friday 8am- 5pm  After hours call [phone]

## 2018-08-06 DIAGNOSIS — R339 Retention of urine, unspecified: Secondary | ICD-10-CM | POA: Insufficient documentation

## 2018-08-07 ENCOUNTER — Other Ambulatory Visit
Admission: RE | Admit: 2018-08-07 | Discharge: 2018-08-07 | Disposition: A | Discharge status: Home / Self Care | Payer: Medicare Other | Source: Skilled Nursing Facility | Attending: Adult Health | Admitting: Adult Health

## 2018-08-07 DIAGNOSIS — E039 Hypothyroidism, unspecified: Secondary | ICD-10-CM | POA: Insufficient documentation

## 2018-08-07 LAB — TSH: TSH: 3.168 u[IU]/mL (ref 0.350–4.500)

## 2018-08-07 LAB — VALPROIC ACID LEVEL: Valproic Acid Lvl: 64 ug/mL (ref 50.0–100.0)

## 2018-08-24 ENCOUNTER — Non-Acute Institutional Stay (SKILLED_NURSING_FACILITY): Payer: Medicare Other | Admitting: Adult Health

## 2018-08-24 DIAGNOSIS — I6930 Unspecified sequelae of cerebral infarction: Secondary | ICD-10-CM | POA: Diagnosis not present

## 2018-08-24 DIAGNOSIS — I5032 Chronic diastolic (congestive) heart failure: Secondary | ICD-10-CM | POA: Diagnosis not present

## 2018-08-24 DIAGNOSIS — N183 Chronic kidney disease, stage 3 unspecified: Secondary | ICD-10-CM

## 2018-08-25 ENCOUNTER — Encounter: Payer: Self-pay | Admitting: Adult Health

## 2018-08-25 NOTE — Progress Notes (Signed)
Location:    [facility] Room Number: 3 Place of Service:  SNF (31)   CODE STATUS: full code   No Known Allergies  Chief Complaint  Patient presents with  . Acute Visit    care plan    HPI:  We have come together for her routine care plan meeting. She does have family present. Her appetite is good her weight is stable around 218 pounds. Her family would like to begin smaller portions. She is followed by psychiatry; who manages her psycho-active medications. Her mood status is stable at this time. Her cognition fluctuates. There are no reports of uncontrolled pain. She continues to be followed for her chronic illnesses including:  Cva; diastolic heart failure; ckd stage 3.   Past Medical History:  Diagnosis Date  . Cerebral infarction (HCC)   . CKD (chronic kidney disease), stage III (HCC)   . Depression   . DJD (degenerative joint disease)   . Edema   . High cholesterol   . Hyperlipidemia, unspecified   . Hypertension   . Hypothyroidism   . Malaise   . Morbid obesity (HCC)   . Osteoarthritis   . Parathyroid abnormality (HCC)   . Seizures (HCC)    a. following remote stroke.  . Stroke (HCC) 2002, 05/2017, 01/26/18, 01/30/18   a. 1964-->residual right arm wkns.  Previously on coumadin - pt says for just a few yrs.  . Subdural hematoma (HCC)    a. 10/2001 [facility] req temporal frontoparietal craniotomy following fall. ? whether or not pt on coumadin @ time.  Notes indicate yes but pt denies.  . Thyroid disease   . TMJ (dislocation of temporomandibular joint)   . Tuberculosis    a. ~ 1950  . Urinary incontinence   . Weight loss     Past Surgical History:  Procedure Laterality Date  . ABDOMINAL HYSTERECTOMY    . BACK SURGERY    . BRAIN SURGERY    . cataract surgery     2014  . LEFT HEART CATH AND CORONARY ANGIOGRAPHY N/A 06/13/2017   Procedure: LEFT HEART CATH AND CORONARY ANGIOGRAPHY;  Surgeon: Iran OuchArida, Muhammad A, MD;  Location: [facility] INVASIVE CV LAB;  Service:  Cardiovascular;  Laterality: N/A;  . PARATHYROID ADENOMA REMOVAL    . TEE WITHOUT CARDIOVERSION N/A 01/31/2018   Procedure: TRANSESOPHAGEAL ECHOCARDIOGRAM (TEE);  Surgeon: Yvonne KendallEnd, Christopher, MD;  Location: [facility] ORS;  Service: Cardiovascular;  Laterality: N/A;  . TEE WITHOUT CARDIOVERSION N/A 02/01/2018   Procedure: TRANSESOPHAGEAL ECHOCARDIOGRAM (TEE);  Surgeon: Antonieta IbaGollan, Timothy J, MD;  Location: [facility] ORS;  Service: Cardiovascular;  Laterality: N/A;    Social History   Socioeconomic History  . Marital status: Married    Spouse name: Fayrene FearingJames  . Number of children: 2  . Years of education: BS  . Highest education level: Not on file  Occupational History  . Not on file  Social Needs  . Financial resource strain: Not on file  . Food insecurity:    Worry: Not on file    Inability: Not on file  . Transportation needs:    Medical: Not on file    Non-medical: Not on file  Tobacco Use  . Smoking status: Never Smoker  . Smokeless tobacco: Never Used  Substance and Sexual Activity  . Alcohol use: No  . Drug use: No  . Sexual activity: Not on file  Lifestyle  . Physical activity:    Days per week: Not on file    Minutes  per session: Not on file  . Stress: Not on file  Relationships  . Social connections:    Talks on phone: Not on file    Gets together: Not on file    Attends religious service: Not on file    Active member of club or organization: Not on file    Attends meetings of clubs or organizations: Not on file    Relationship status: Not on file  . Intimate partner violence:    Fear of current or ex partner: Not on file    Emotionally abused: Not on file    Physically abused: Not on file    Forced sexual activity: Not on file  Other Topics Concern  . Not on file  Social History Narrative   02/22/18 [facility] [place]   [place].  Active around the house.  Does not routinely exercise or drive.  Does own  grocery shopping.   Family History  Problem Relation Age of Onset  . Heart failure Mother        died @ 4288  . Heart attack Father        died @ 588  . Stroke Father   . Breast cancer Neg Hx       VITAL SIGNS BP (!) 124/55   Pulse 72   Temp 97.9 F (36.6 C)   Resp 20   Ht 5\' 1"  (1.549 m)   Wt 218 lb 9.6 oz (99.2 kg)   SpO2 95%   BMI 41.30 kg/m   Outpatient Encounter Medications as of 08/24/2018  Medication Sig  . acetaminophen (TYLENOL) 325 MG tablet Take 650 mg by mouth every 4 (four) hours as needed for mild pain or moderate pain.   Marland Kitchen. aspirin 325 MG tablet Take 1 tablet (325 mg total) by mouth daily.  Marland Kitchen. atorvastatin (LIPITOR) 80 MG tablet Take 1 tablet (80 mg total) by mouth daily at 6 PM.  . cloNIDine (CATAPRES - DOSED IN MG/24 HR) 0.3 mg/24hr patch Place 1 patch onto the skin every Friday.   . clopidogrel (PLAVIX) 75 MG tablet Take 1 tablet (75 mg total) by mouth daily.  . cyanocobalamin 1000 MCG tablet Take 1,000 mcg by mouth daily.  . divalproex (DEPAKOTE) 250 MG DR tablet Take 250 mg by mouth 3 (three) times daily.  Marland Kitchen. latanoprost (XALATAN) 0.005 % ophthalmic solution Place 1 drop into both eyes at bedtime.   Marland Kitchen. levothyroxine (SYNTHROID, LEVOTHROID) 88 MCG tablet Take 1 tablet by mouth daily.   Marland Kitchen. lisinopril-hydrochlorothiazide (PRINZIDE,ZESTORETIC) 20-25 MG tablet Take 1 tablet by mouth daily.   . mirtazapine (REMERON) 45 MG tablet Take 45 mg by mouth at bedtime.   . Nutritional Supplements (NUTRITIONAL SUPPLEMENT PO) Diet Type: Heart healthy  . polyethylene glycol (MIRALAX / GLYCOLAX) packet Take 17 g by mouth daily as needed for mild constipation.  . potassium chloride SA (K-DUR,KLOR-CON) 20 MEQ tablet Take 20 mEq by mouth daily as needed. Take with Lasix for edema  . tamsulosin (FLOMAX) 0.4 MG CAPS capsule Take 1 capsule (0.4 mg total) by mouth daily.   No facility-administered encounter medications on file as of 08/24/2018.      SIGNIFICANT DIAGNOSTIC  EXAMS   LABS REVIEWED:   01-27-18: hgb a1c 5.6  01-28-18: chol 149; ldl 81; trig 139; hdl 40  9-14-786-16-19: wbc 8.2; hgb 13.1; hct 38.9; mcv 94.1; plt 235; glucose 141; bun 26; creat 1.03; k+ 3.1; na++ 140 ca  9.8 liver normal albumin 9.8 urine culture: 50,000 lactobacillus  05-12-18: wbc 5.1; hgb 11.8; hct 35.4; mcv 92.3; plt 117; glucose 93; bun 25; creat 1.01; k+ 3.5; na++ 142; ca 8.8; liver normal albumin 3.3 depakote 93 05-30-18: vit B 12; 279 06-09-18: wbc 5.5; hgb 10.8; hct 34.0; mcv 92.6; plt 184; glucose 90; bun 22; creat 0.98; k+ 4.0; na++ 143; ca 8.6  Liver normal albumin 2.6   08-07-18: tsh 3.168; depakote 64   Review of Systems  Unable to perform ROS: Dementia (confusion)    Physical Exam Constitutional:      General: She is not in acute distress.    Appearance: Normal appearance. She is well-developed. She is obese. She is not diaphoretic.  Neck:     Musculoskeletal: Neck supple.     Thyroid: No thyromegaly.  Cardiovascular:     Rate and Rhythm: Normal rate and regular rhythm.     Pulses: Normal pulses.     Heart sounds: Normal heart sounds.  Pulmonary:     Effort: Pulmonary effort is normal. No respiratory distress.     Breath sounds: Normal breath sounds.  Abdominal:     General: Bowel sounds are normal. There is no distension.     Palpations: Abdomen is soft.     Tenderness: There is no abdominal tenderness.  Musculoskeletal:     Right lower leg: No edema.     Left lower leg: No edema.     Comments: Has right side weakness Slight right facial droop  Is able to move all extremities     Lymphadenopathy:     Cervical: No cervical adenopathy.  Skin:    General: Skin is warm and dry.  Neurological:     Mental Status: She is alert. Mental status is at baseline.  Psychiatric:        Mood and Affect: Mood normal.     ASSESSMENT/ PLAN:  TODAY:   1. Chronic cva 2. Diastolic heart failure 3. ckd stage 3  Will have her begin smaller portions to better manage her  weight Will continue her current medications; will not do any reducing of her medications without speaking with psychiatry first Will continue her current plan of care.       MD is aware of resident's narcotic use and is in agreement with current plan of care. We will attempt to wean resident as apropriate   Synthia Innocent NP [facility]  Contact [phone] Monday through Friday 8am- 5pm  After hours call [phone]

## 2018-08-30 ENCOUNTER — Encounter
Admission: RE | Admit: 2018-08-30 | Discharge: 2018-08-30 | Disposition: A | Discharge status: Home / Self Care | Payer: Medicare Other | Source: Ambulatory Visit | Attending: Internal Medicine | Admitting: Internal Medicine

## 2018-08-30 DIAGNOSIS — N189 Chronic kidney disease, unspecified: Secondary | ICD-10-CM | POA: Insufficient documentation

## 2018-08-30 DIAGNOSIS — I130 Hypertensive heart and chronic kidney disease with heart failure and stage 1 through stage 4 chronic kidney disease, or unspecified chronic kidney disease: Secondary | ICD-10-CM | POA: Insufficient documentation

## 2018-09-07 ENCOUNTER — Non-Acute Institutional Stay (SKILLED_NURSING_FACILITY): Payer: Medicare Other | Admitting: Adult Health

## 2018-09-07 ENCOUNTER — Encounter: Payer: Self-pay | Admitting: Adult Health

## 2018-09-07 DIAGNOSIS — N183 Chronic kidney disease, stage 3 unspecified: Secondary | ICD-10-CM

## 2018-09-07 DIAGNOSIS — I110 Hypertensive heart disease with heart failure: Secondary | ICD-10-CM | POA: Diagnosis not present

## 2018-09-07 DIAGNOSIS — E785 Hyperlipidemia, unspecified: Secondary | ICD-10-CM

## 2018-09-07 DIAGNOSIS — E034 Atrophy of thyroid (acquired): Secondary | ICD-10-CM | POA: Diagnosis not present

## 2018-09-07 DIAGNOSIS — I5032 Chronic diastolic (congestive) heart failure: Secondary | ICD-10-CM

## 2018-09-07 NOTE — Progress Notes (Signed)
[facility] Room Number: 310 B Place of Service:  SNF (31)   CODE STATUS: Full Code (Patient changed from DNR to Full Code on 02/24/18)  No Known Allergies  Chief Complaint  Patient presents with  . Medical Management of Chronic Issues    Hypertensive heart disease with chronic diastolic congestive heart failure; hypothyroidism due to acquired atrophy of thyroid; chronic kidney disease (CKD) stage III (moderate): dyslipidemia     HPI:  She is a 83 year old long term resident of this facility being seen for the management of her chronic illnesses: hypertensive heart disease; hypothyroidism; ckd stage III; dyslipidemia. She is unable to fully participate in the ROS. There are no reports of changes in appetite; no uncontrolled pain; no insomnia; no anxiety.   Past Medical History:  Diagnosis Date  . Cerebral infarction (HCC)   . CKD (chronic kidney disease), stage III (HCC)   . Depression   . DJD (degenerative joint disease)   . Edema   . High cholesterol   . Hyperlipidemia, unspecified   . Hypertension   . Hypothyroidism   . Malaise   . Morbid obesity (HCC)   . Osteoarthritis   . Parathyroid abnormality (HCC)   . Seizures (HCC)    a. following remote stroke.  . Stroke (HCC) 2002, 05/2017, 01/26/18, 01/30/18   a. 1964-->residual right arm wkns.  Previously on coumadin - pt says for just a few yrs.  . Subdural hematoma (HCC)    a. 10/2001 [facility] req temporal frontoparietal craniotomy following fall. ? whether or not pt on coumadin @ time.  Notes indicate yes but pt denies.  . Thyroid disease   . TMJ (dislocation of temporomandibular joint)   . Tuberculosis    a. ~ 1950  . Urinary incontinence   . Weight loss     Past Surgical History:  Procedure Laterality Date  . ABDOMINAL HYSTERECTOMY    . BACK SURGERY    . BRAIN SURGERY    . cataract surgery     2014  . LEFT HEART CATH AND CORONARY ANGIOGRAPHY N/A 06/13/2017   Procedure: LEFT  HEART CATH AND CORONARY ANGIOGRAPHY;  Surgeon: Iran OuchArida, Muhammad A, MD;  Location: [facility] INVASIVE CV LAB;  Service: Cardiovascular;  Laterality: N/A;  . PARATHYROID ADENOMA REMOVAL    . TEE WITHOUT CARDIOVERSION N/A 01/31/2018   Procedure: TRANSESOPHAGEAL ECHOCARDIOGRAM (TEE);  Surgeon: Yvonne KendallEnd, Christopher, MD;  Location: [facility] ORS;  Service: Cardiovascular;  Laterality: N/A;  . TEE WITHOUT CARDIOVERSION N/A 02/01/2018   Procedure: TRANSESOPHAGEAL ECHOCARDIOGRAM (TEE);  Surgeon: Antonieta IbaGollan, Timothy J, MD;  Location: [facility] ORS;  Service: Cardiovascular;  Laterality: N/A;    Social History   Socioeconomic History  . Marital status: Married    Spouse name: Fayrene FearingJames  . Number of children: 2  . Years of education: BS  . Highest education level: Not on file  Occupational History  . Not on file  Social Needs  . Financial resource strain: Not on file  . Food insecurity:    Worry: Not on file    Inability: Not on file  . Transportation needs:    Medical: Not on file    Non-medical: Not on file  Tobacco Use  . Smoking status: Never Smoker  . Smokeless tobacco: Never Used  Substance and Sexual Activity  . Alcohol use: No  . Drug use: No  . Sexual activity: Not on file  Lifestyle  . Physical activity:    Days per  week: Not on file    Minutes per session: Not on file  . Stress: Not on file  Relationships  . Social connections:    Talks on phone: Not on file    Gets together: Not on file    Attends religious service: Not on file    Active member of club or organization: Not on file    Attends meetings of clubs or organizations: Not on file    Relationship status: Not on file  . Intimate partner violence:    Fear of current or ex partner: Not on file    Emotionally abused: Not on file    Physically abused: Not on file    Forced sexual activity: Not on file  Other Topics Concern  . Not on file  Social History Narrative   02/22/18 residing at [facility] in [place].  Active around the house.  Does not routinely exercise or drive.  Does own grocery shopping.   Family History  Problem Relation Age of Onset  . Heart failure Mother        died @ 61  . Heart attack Father        died @ 3  . Stroke Father   . Breast cancer Neg Hx       VITAL SIGNS BP 130/60   Pulse 68   Temp 97.7 F (36.5 C)   Resp 20   Ht 5\' 1"  (1.549 m)   SpO2 97%   BMI 41.30 kg/m   Outpatient Encounter Medications as of 09/07/2018  Medication Sig  . acetaminophen (TYLENOL) 325 MG tablet Take 650 mg by mouth every 4 (four) hours as needed for mild pain or moderate pain.   Marland Kitchen aspirin 325 MG tablet Take 1 tablet (325 mg total) by mouth daily.  Marland Kitchen atorvastatin (LIPITOR) 80 MG tablet Take 1 tablet (80 mg total) by mouth daily at 6 PM.  . citalopram (CELEXA) 20 MG tablet Take 20 mg by mouth daily.  . cloNIDine (CATAPRES - DOSED IN MG/24 HR) 0.3 mg/24hr patch Place 1 patch onto the skin every Friday.   . clopidogrel (PLAVIX) 75 MG tablet Take 1 tablet (75 mg total) by mouth daily.  . cyanocobalamin 1000 MCG tablet Take 1,000 mcg by mouth daily.  . divalproex (DEPAKOTE) 250 MG DR tablet Take 250 mg by mouth 3 (three) times daily.  Marland Kitchen latanoprost (XALATAN) 0.005 % ophthalmic solution Place 1 drop into both eyes at bedtime.   Marland Kitchen levothyroxine (SYNTHROID, LEVOTHROID) 88 MCG tablet Take 1 tablet by mouth daily.   Marland Kitchen lisinopril-hydrochlorothiazide (PRINZIDE,ZESTORETIC) 20-25 MG tablet Take 1 tablet by mouth daily.   . mirtazapine (REMERON) 45 MG tablet Take 45 mg by mouth at bedtime.   . Nutritional Supplements (NUTRITIONAL SUPPLEMENT PO) Diet Type: Heart healthy  . polyethylene glycol (MIRALAX / GLYCOLAX) packet Take 17 g by mouth daily.  . potassium chloride SA (K-DUR,KLOR-CON) 20 MEQ tablet Take 20 mEq by mouth daily as needed. Take with Lasix for edema  . tamsulosin (FLOMAX) 0.4 MG CAPS capsule Take 1 capsule (0.4 mg total) by mouth daily.  .  [DISCONTINUED] polyethylene glycol (MIRALAX / GLYCOLAX) packet Take 17 g by mouth daily as needed for mild constipation. (Patient not taking: Reported on 09/07/2018)   No facility-administered encounter medications on file as of 09/07/2018.      SIGNIFICANT DIAGNOSTIC EXAMS  LABS REVIEWED:   01-27-18: hgb a1c 5.6  01-28-18:  chol 149; ldl 81; trig 139; hdl 40  1-82-99: wbc 8.2; hgb 13.1; hct 38.9; mcv 94.1; plt 235; glucose 141; bun 26; creat 1.03; k+ 3.1; na++ 140 ca 9.8 liver normal albumin 9.8 urine culture: 50,000 lactobacillus  05-12-18: wbc 5.1; hgb 11.8; hct 35.4; mcv 92.3; plt 117; glucose 93; bun 25; creat 1.01; k+ 3.5; na++ 142; ca 8.8; liver normal albumin 3.3 depakote 93 05-30-18: vit B 12; 279 06-09-18: wbc 5.5; hgb 10.8; hct 34.0; mcv 92.6; plt 184; glucose 90; bun 22; creat 0.98; k+ 4.0; na++ 143; ca 8.6  Liver normal albumin 2.6  08-07-18: tsh 3.168; depakote 64  NO NEW LABS.   Review of Systems  Constitutional: Negative for malaise/fatigue.  Respiratory: Negative for cough and shortness of breath.   Cardiovascular: Negative for chest pain.  Gastrointestinal: Negative for abdominal pain.  Musculoskeletal: Negative for back pain, joint pain and myalgias.  Skin: Negative.   Psychiatric/Behavioral: The patient is not nervous/anxious.      Physical Exam Constitutional:      General: She is not in acute distress.    Appearance: She is well-developed. She is obese. She is not diaphoretic.  Neck:     Musculoskeletal: Neck supple.     Thyroid: No thyromegaly.  Cardiovascular:     Rate and Rhythm: Normal rate and regular rhythm.     Pulses: Normal pulses.     Heart sounds: Normal heart sounds.  Pulmonary:     Effort: Pulmonary effort is normal. No respiratory distress.     Breath sounds: Normal breath sounds.  Abdominal:     General: Bowel sounds are normal. There is no distension.     Palpations: Abdomen is soft.     Tenderness: There is no abdominal tenderness.    Musculoskeletal:     Right lower leg: No edema.     Left lower leg: No edema.     Comments: Has right side weakness Slight right facial droop  Is able to move all extremities   Lymphadenopathy:     Cervical: No cervical adenopathy.  Skin:    General: Skin is warm and dry.  Neurological:     Mental Status: She is alert. Mental status is at baseline.  Psychiatric:        Mood and Affect: Mood normal.     ASSESSMENT/ PLAN:  TODAY;   1. Hypertensive heart disease with chronic diastolic heart failure: is stable b/p 130/60: will continue clonidine 0.3 mg weekly patch; lisinopril hct 20-25 mg daily   2.  Chronic kidney disease stage III: is stable bun 22; creat 0.98  3. Dyslipidemia:  Is stable ldl 81 will continue lipitor 80 mg daily  4. Hypothyroidism due to atrophy of thyroid: is stable will continue synthroid 88 mcg daily   PREVIOUS   5. Bilateral glaucoma: is stable will continue xalatan to both eyes nightly   6. Depression with anxiety: is stable: will continue remeron 45 mg nightly  Will continue depakote 250 mg three times daily to stabilize mood  7. Chronic retention of urine: is stable will continue flomax 0.4 mg daily   8.  Chronic cerebrovascular accident: is neurologically stable; will continue asa 325 mg daily and plavix 75 mg daily  9.  Chronic diastolic heart failure: is stable will continue to monitor her status.    MD is aware of resident's narcotic use and is in agreement with current plan of care. We will attempt to wean resident as apropriate   Synthia Innocent  NP [facility]  Contact [phone] Monday through Friday 8am- 5pm  After hours call [phone]

## 2018-09-22 ENCOUNTER — Other Ambulatory Visit: Payer: Self-pay | Admitting: Internal Medicine

## 2018-09-30 ENCOUNTER — Encounter
Admission: RE | Admit: 2018-09-30 | Discharge: 2018-09-30 | Disposition: A | Discharge status: Home / Self Care | Payer: Medicare Other | Source: Ambulatory Visit | Attending: Internal Medicine | Admitting: Internal Medicine

## 2018-09-30 DIAGNOSIS — I130 Hypertensive heart and chronic kidney disease with heart failure and stage 1 through stage 4 chronic kidney disease, or unspecified chronic kidney disease: Secondary | ICD-10-CM | POA: Insufficient documentation

## 2018-09-30 DIAGNOSIS — N189 Chronic kidney disease, unspecified: Secondary | ICD-10-CM | POA: Insufficient documentation

## 2018-10-04 ENCOUNTER — Other Ambulatory Visit
Admission: RE | Admit: 2018-10-04 | Discharge: 2018-10-04 | Disposition: A | Discharge status: Home / Self Care | Payer: Medicare Other | Source: Ambulatory Visit | Attending: Internal Medicine | Admitting: Internal Medicine

## 2018-10-04 DIAGNOSIS — R569 Unspecified convulsions: Secondary | ICD-10-CM | POA: Insufficient documentation

## 2018-10-04 LAB — CBC WITH DIFFERENTIAL/PLATELET
Abs Immature Granulocytes: 0.02 10*3/uL (ref 0.00–0.07)
BASOS PCT: 1 %
Basophils Absolute: 0 10*3/uL (ref 0.0–0.1)
EOS ABS: 0.2 10*3/uL (ref 0.0–0.5)
Eosinophils Relative: 3 %
HCT: 35.4 % — ABNORMAL LOW (ref 36.0–46.0)
Hemoglobin: 11.3 g/dL — ABNORMAL LOW (ref 12.0–15.0)
IMMATURE GRANULOCYTES: 0 %
Lymphocytes Relative: 30 %
Lymphs Abs: 1.8 10*3/uL (ref 0.7–4.0)
MCH: 30.1 pg (ref 26.0–34.0)
MCHC: 31.9 g/dL (ref 30.0–36.0)
MCV: 94.4 fL (ref 80.0–100.0)
MONO ABS: 0.9 10*3/uL (ref 0.1–1.0)
Monocytes Relative: 14 %
NEUTROS PCT: 52 %
Neutro Abs: 3.2 10*3/uL (ref 1.7–7.7)
PLATELETS: 143 10*3/uL — AB (ref 150–400)
RBC: 3.75 MIL/uL — AB (ref 3.87–5.11)
RDW: 13.8 % (ref 11.5–15.5)
WBC: 6.2 10*3/uL (ref 4.0–10.5)
nRBC: 0 % (ref 0.0–0.2)

## 2018-10-04 LAB — COMPREHENSIVE METABOLIC PANEL
ALT: 16 U/L (ref 0–44)
ANION GAP: 4 — AB (ref 5–15)
AST: 18 U/L (ref 15–41)
Albumin: 3 g/dL — ABNORMAL LOW (ref 3.5–5.0)
Alkaline Phosphatase: 62 U/L (ref 38–126)
BILIRUBIN TOTAL: 0.4 mg/dL (ref 0.3–1.2)
BUN: 19 mg/dL (ref 8–23)
CO2: 31 mmol/L (ref 22–32)
Calcium: 8.5 mg/dL — ABNORMAL LOW (ref 8.9–10.3)
Chloride: 103 mmol/L (ref 98–111)
Creatinine, Ser: 0.88 mg/dL (ref 0.44–1.00)
Glucose, Bld: 90 mg/dL (ref 70–99)
POTASSIUM: 3.9 mmol/L (ref 3.5–5.1)
Sodium: 138 mmol/L (ref 135–145)
TOTAL PROTEIN: 5.7 g/dL — AB (ref 6.5–8.1)

## 2018-10-04 LAB — VALPROIC ACID LEVEL: VALPROIC ACID LVL: 50 ug/mL (ref 50.0–100.0)

## 2018-10-05 LAB — VALPROIC ACID LEVEL, FREE: Valproic Acid, Free: 9.3 ug/mL (ref 6.0–22.0)

## 2018-10-10 ENCOUNTER — Non-Acute Institutional Stay (SKILLED_NURSING_FACILITY): Payer: Medicare Other | Admitting: Adult Health

## 2018-10-10 ENCOUNTER — Encounter: Payer: Self-pay | Admitting: Adult Health

## 2018-10-10 DIAGNOSIS — H409 Unspecified glaucoma: Secondary | ICD-10-CM | POA: Diagnosis not present

## 2018-10-10 DIAGNOSIS — R339 Retention of urine, unspecified: Secondary | ICD-10-CM

## 2018-10-10 DIAGNOSIS — I6930 Unspecified sequelae of cerebral infarction: Secondary | ICD-10-CM | POA: Diagnosis not present

## 2018-10-10 DIAGNOSIS — F418 Other specified anxiety disorders: Secondary | ICD-10-CM

## 2018-10-10 NOTE — Progress Notes (Signed)
Location:   [facility] Room Number: 310 B Place of Service:  SNF (31)   CODE STATUS: Full Code (Patient changed from DNR to Full Code on 02/24/18)  No Known Allergies  Chief Complaint  Patient presents with  . Medical Management of Chronic Issues    Chronic cerebrovascular accident; chronic retention of urine; glaucoma of both eyes unspecified; depression with anxiety.     HPI:  She is a 83 year old long term resident of this facility being seen for the management of her chronic illnesses: cva; urine retention; glaucoma; depression. She denies any anxiety or depressive thoughts. No changes in appetite; no uncontrolled pain.   Past Medical History:  Diagnosis Date  . Cerebral infarction (HCC)   . CKD (chronic kidney disease), stage III (HCC)   . Depression   . DJD (degenerative joint disease)   . Edema   . High cholesterol   . Hyperlipidemia, unspecified   . Hypertension   . Hypothyroidism   . Malaise   . Morbid obesity (HCC)   . Osteoarthritis   . Parathyroid abnormality (HCC)   . Seizures (HCC)    a. following remote stroke.  . Stroke (HCC) 2002, 05/2017, 01/26/18, 01/30/18   a. 1964-->residual right arm wkns.  Previously on coumadin - pt says for just a few yrs.  . Subdural hematoma (HCC)    a. 10/2001 [facility] req temporal frontoparietal craniotomy following fall. ? whether or not pt on coumadin @ time.  Notes indicate yes but pt denies.  . Thyroid disease   . TMJ (dislocation of temporomandibular joint)   . Tuberculosis    a. ~ 1950  . Urinary incontinence   . Weight loss     Past Surgical History:  Procedure Laterality Date  . ABDOMINAL HYSTERECTOMY    . BACK SURGERY    . BRAIN SURGERY    . cataract surgery     2014  . LEFT HEART CATH AND CORONARY ANGIOGRAPHY N/A 06/13/2017   Procedure: LEFT HEART CATH AND CORONARY ANGIOGRAPHY;  Surgeon: Iran OuchArida, Muhammad A, MD;  Location: [facility] INVASIVE CV LAB;  Service: Cardiovascular;  Laterality: N/A;    . PARATHYROID ADENOMA REMOVAL    . TEE WITHOUT CARDIOVERSION N/A 01/31/2018   Procedure: TRANSESOPHAGEAL ECHOCARDIOGRAM (TEE);  Surgeon: Yvonne KendallEnd, Christopher, MD;  Location: [facility] ORS;  Service: Cardiovascular;  Laterality: N/A;  . TEE WITHOUT CARDIOVERSION N/A 02/01/2018   Procedure: TRANSESOPHAGEAL ECHOCARDIOGRAM (TEE);  Surgeon: Antonieta IbaGollan, Timothy J, MD;  Location: [facility] ORS;  Service: Cardiovascular;  Laterality: N/A;    Social History   Socioeconomic History  . Marital status: Married    Spouse name: Fayrene FearingJames  . Number of children: 2  . Years of education: BS  . Highest education level: Not on file  Occupational History  . Not on file  Social Needs  . Financial resource strain: Not on file  . Food insecurity:    Worry: Not on file    Inability: Not on file  . Transportation needs:    Medical: Not on file    Non-medical: Not on file  Tobacco Use  . Smoking status: Never Smoker  . Smokeless tobacco: Never Used  Substance and Sexual Activity  . Alcohol use: No  . Drug use: No  . Sexual activity: Not on file  Lifestyle  . Physical activity:    Days per week: Not on file    Minutes per session: Not on file  . Stress: Not on file  Relationships  .  Social connections:    Talks on phone: Not on file    Gets together: Not on file    Attends religious service: Not on file    Active member of club or organization: Not on file    Attends meetings of clubs or organizations: Not on file    Relationship status: Not on file  . Intimate partner violence:    Fear of current or ex partner: Not on file    Emotionally abused: Not on file    Physically abused: Not on file    Forced sexual activity: Not on file  Other Topics Concern  . Not on file  Social History Narrative   02/22/18 residing at [facility] in [place]  Active around the house.  Does not routinely exercise or drive.  Does own grocery shopping.   Family History   Problem Relation Age of Onset  . Heart failure Mother        died @ 67  . Heart attack Father        died @ 3  . Stroke Father   . Breast cancer Neg Hx       VITAL SIGNS BP (!) 104/51   Pulse 65   Temp (!) 97.4 F (36.3 C)   Resp 18   Ht 5\' 1"  (1.549 m)   Wt 193 lb 14.4 oz (88 kg)   SpO2 95%   BMI 36.64 kg/m   Outpatient Encounter Medications as of 10/10/2018  Medication Sig  . acetaminophen (TYLENOL) 325 MG tablet Take 650 mg by mouth every 4 (four) hours as needed for mild pain or moderate pain.   Marland Kitchen aspirin 325 MG tablet Take 1 tablet (325 mg total) by mouth daily.  Marland Kitchen atorvastatin (LIPITOR) 80 MG tablet Take 1 tablet (80 mg total) by mouth daily at 6 PM.  . citalopram (CELEXA) 20 MG tablet Take 20 mg by mouth daily.  . cloNIDine (CATAPRES - DOSED IN MG/24 HR) 0.3 mg/24hr patch Place 1 patch onto the skin every Friday.   . clopidogrel (PLAVIX) 75 MG tablet Take 1 tablet (75 mg total) by mouth daily.  . cyanocobalamin 1000 MCG tablet Take 1,000 mcg by mouth daily.  . divalproex (DEPAKOTE) 250 MG DR tablet Take 250 mg by mouth 3 (three) times daily.  Marland Kitchen latanoprost (XALATAN) 0.005 % ophthalmic solution Place 1 drop into both eyes at bedtime.   Marland Kitchen levothyroxine (SYNTHROID, LEVOTHROID) 88 MCG tablet Take 1 tablet by mouth daily.   Marland Kitchen lisinopril-hydrochlorothiazide (PRINZIDE,ZESTORETIC) 20-25 MG tablet Take 1 tablet by mouth daily.   . mirtazapine (REMERON) 45 MG tablet Take 45 mg by mouth at bedtime.   . Nutritional Supplements (NUTRITIONAL SUPPLEMENT PO) Diet Type: Heart healthy, NAS  . OLANZapine (ZYPREXA) 2.5 MG tablet Take 2.5 mg by mouth daily as needed.  . polyethylene glycol (MIRALAX / GLYCOLAX) packet Take 17 g by mouth daily.  . potassium chloride SA (K-DUR,KLOR-CON) 20 MEQ tablet Take 20 mEq by mouth daily as needed. Take with Lasix for edema  . tamsulosin (FLOMAX) 0.4 MG CAPS capsule Take 1 capsule (0.4 mg total) by mouth daily.   No facility-administered encounter  medications on file as of 10/10/2018.      SIGNIFICANT DIAGNOSTIC EXAMS  LABS REVIEWED:   01-27-18: hgb a1c 5.6  01-28-18: chol 149; ldl 81; trig 139; hdl 40  5-85-27: wbc 8.2; hgb 13.1; hct 38.9; mcv 94.1; plt 235; glucose 141;  bun 26; creat 1.03; k+ 3.1; na++ 140 ca 9.8 liver normal albumin 9.8 urine culture: 50,000 lactobacillus  05-12-18: wbc 5.1; hgb 11.8; hct 35.4; mcv 92.3; plt 117; glucose 93; bun 25; creat 1.01; k+ 3.5; na++ 142; ca 8.8; liver normal albumin 3.3 depakote 93 05-30-18: vit B 12; 279 06-09-18: wbc 5.5; hgb 10.8; hct 34.0; mcv 92.6; plt 184; glucose 90; bun 22; creat 0.98; k+ 4.0; na++ 143; ca 8.6  Liver normal albumin 2.6  08-07-18: tsh 3.168; depakote 64  TODAY;   10-04-18: wbc 6.2; hgb 11.3; hct 35.4; mcv 94.4; plt 143; glucose 90; bun 19; creat 0.88; k+ 3.9; na++ 138; ca 8.5; liver normal albumin 3.0; depakote 50   Review of Systems  Constitutional: Negative for malaise/fatigue.  Respiratory: Negative for cough and shortness of breath.   Cardiovascular: Negative for chest pain, palpitations and leg swelling.  Gastrointestinal: Negative for abdominal pain, constipation and heartburn.  Musculoskeletal: Negative for back pain, joint pain and myalgias.  Skin: Negative.   Neurological: Negative for dizziness.  Psychiatric/Behavioral: The patient is not nervous/anxious.     Physical Exam Constitutional:      General: She is not in acute distress.    Appearance: She is well-developed. She is obese. She is not diaphoretic.  Neck:     Musculoskeletal: Neck supple.     Thyroid: No thyromegaly.  Cardiovascular:     Rate and Rhythm: Normal rate and regular rhythm.     Pulses: Normal pulses.     Heart sounds: Normal heart sounds.  Pulmonary:     Effort: Pulmonary effort is normal. No respiratory distress.     Breath sounds: Normal breath sounds.  Abdominal:     General: Bowel sounds are normal. There is no distension.     Palpations: Abdomen is soft.      Tenderness: There is no abdominal tenderness.  Musculoskeletal:     Right lower leg: No edema.     Left lower leg: No edema.     Comments: Has right side weakness Slight right facial droop  Is able to move all extremities    Lymphadenopathy:     Cervical: No cervical adenopathy.  Skin:    General: Skin is warm and dry.  Neurological:     Mental Status: She is alert.  Psychiatric:        Mood and Affect: Mood normal.     ASSESSMENT/ PLAN:  TODAY;   1. Bilateral glaucoma: is stable will continue xalatan to both eyes nightly   2. Depression with anxiety: is stable: will continue remeron 45 mg nightly  Will continue depakote 250 mg three times daily to stabilize mood  3. Chronic retention of urine: is stable will continue flomax 0.4 mg daily   4.  Chronic cerebrovascular accident: is neurologically stable; will continue asa 325 mg daily and plavix 75 mg daily  PREVIOUS   5.  Chronic diastolic heart failure: is stable will continue to monitor her status.   6. Hypertensive heart disease with chronic diastolic heart failure: is stable b/p 104/51: will continue clonidine 0.3 mg weekly patch; lisinopril hct 20-25 mg daily   7.  Chronic kidney disease stage III: is stable bun 19; creat 0.98  8. Dyslipidemia:  Is stable ldl 81 will continue lipitor 80 mg daily  9. Hypothyroidism due to atrophy of thyroid: is stable will continue synthroid 88 mcg daily   Will give prevanr 13   MD is aware of resident's narcotic use and is in  agreement with current plan of care. We will attempt to wean resident as apropriate   Ok Edwards NP [facility]  Contact [phone] Monday through Friday 8am- 5pm  After hours call [phone]

## 2018-10-16 ENCOUNTER — Encounter: Payer: Self-pay | Admitting: Adult Health

## 2018-10-16 ENCOUNTER — Non-Acute Institutional Stay (SKILLED_NURSING_FACILITY): Payer: Medicare Other | Admitting: Adult Health

## 2018-10-16 DIAGNOSIS — Z0000 Encounter for general adult medical examination without abnormal findings: Secondary | ICD-10-CM

## 2018-10-16 DIAGNOSIS — Z8673 Personal history of transient ischemic attack (TIA), and cerebral infarction without residual deficits: Secondary | ICD-10-CM | POA: Diagnosis not present

## 2018-10-16 NOTE — Progress Notes (Signed)
Location:  [facility] Room Number: 310-B Place of Service:  SNF (31) Provider:  Kenard GowerMedina-Vargas, Monina, NP  Patient Care Team: Lauro RegulusAnderson, Marshall W, MD as PCP - General (Internal Medicine) Antonieta IbaGollan, Timothy J, MD as Consulting Physician (Cardiology) Sharee HolsterGreen, Deborah S, NP as Nurse Practitioner (Geriatric Medicine)  Extended Emergency Contact Information Primary Emergency Contact: Freda MunroJeffries,James W Address: [address]          [place], [place] [address] [place] Home Phone: [phone] Mobile Phone: [phone] Relation: Spouse Secondary Emergency Contact: Burman FosterJeffries,[name]  [place] Home Phone: [phone] Mobile Phone: [phone] Relation: Daughter  Code Status:  Full Code  Goals of care: Advanced Directive information Advanced Directives 10/10/2018  Does Patient Have a Medical Advance Directive? No  Type of Advance Directive -  Does patient want to make changes to medical advance directive? No - Patient declined  Copy of Healthcare Power of Attorney in Chart? -  Would patient like information on creating a medical advance directive? No - Patient declined     Chief Complaint  Patient presents with  . Acute Visit    Patient was seen for a breast examination.    HPI:  Pt is an 83 y.o. female seen today for an acute visit for a breast examination.  She is a long-term care resident [facility].  She has a PMH of cerebral infarction, CKD stage III, hypercholesterolemia, hyperlipidemia, stroke, OA, hypothyroidism, and hypertension. She was seen in her room today. She is pleasantly confused. Daughter has requested for breast exam. She has right-sided weakness due to history of CVA.   Past Medical History:  Diagnosis Date  . Cerebral infarction (HCC)   . CKD (chronic kidney disease), stage III (HCC)   . Depression   . DJD (degenerative joint disease)   . Edema   . High cholesterol   .  Hyperlipidemia, unspecified   . Hypertension   . Hypothyroidism   . Malaise   . Morbid obesity (HCC)   . Osteoarthritis   . Parathyroid abnormality (HCC)   . Seizures (HCC)    a. following remote stroke.  . Stroke (HCC) 2002, 05/2017, 01/26/18, 01/30/18   a. 1964-->residual right arm wkns.  Previously on coumadin - pt says for just a few yrs.  . Subdural hematoma (HCC)    a. 10/2001 [facility] req temporal frontoparietal craniotomy following fall. ? whether or not pt on coumadin @ time.  Notes indicate yes but pt denies.  . Thyroid disease   . TMJ (dislocation of temporomandibular joint)   . Tuberculosis    a. ~ 1950  . Urinary incontinence   . Weight loss    Past Surgical History:  Procedure Laterality Date  . ABDOMINAL HYSTERECTOMY    . BACK SURGERY    . BRAIN SURGERY    . cataract surgery     2014  . LEFT HEART CATH AND CORONARY ANGIOGRAPHY N/A 06/13/2017   Procedure: LEFT HEART CATH AND CORONARY ANGIOGRAPHY;  Surgeon: Iran OuchArida, Muhammad A, MD;  Location: [facility] INVASIVE CV LAB;  Service: Cardiovascular;  Laterality: N/A;  . PARATHYROID ADENOMA REMOVAL    . TEE WITHOUT CARDIOVERSION N/A 01/31/2018   Procedure: TRANSESOPHAGEAL ECHOCARDIOGRAM (TEE);  Surgeon: Yvonne KendallEnd, Christopher, MD;  Location: [facility] ORS;  Service: Cardiovascular;  Laterality: N/A;  . TEE WITHOUT CARDIOVERSION N/A 02/01/2018   Procedure: TRANSESOPHAGEAL ECHOCARDIOGRAM (TEE);  Surgeon: Antonieta IbaGollan, Timothy J, MD;  Location: [facility] ORS;  Service: Cardiovascular;  Laterality: N/A;  No Known Allergies  Outpatient Encounter Medications as of 10/16/2018  Medication Sig  . acetaminophen (TYLENOL) 325 MG tablet Take 650 mg by mouth every 4 (four) hours as needed for mild pain or moderate pain.   Marland Kitchen aspirin 325 MG tablet Take 1 tablet (325 mg total) by mouth daily.  Marland Kitchen atorvastatin (LIPITOR) 80 MG tablet Take 1 tablet (80 mg total) by mouth daily at 6 PM.  . citalopram (CELEXA) 20 MG tablet Take 20 mg by mouth daily.  . cloNIDine (CATAPRES -  DOSED IN MG/24 HR) 0.3 mg/24hr patch Place 1 patch onto the skin every Friday.   . clopidogrel (PLAVIX) 75 MG tablet Take 1 tablet (75 mg total) by mouth daily.  . cyanocobalamin 1000 MCG tablet Take 1,000 mcg by mouth daily.   . divalproex (DEPAKOTE) 250 MG DR tablet Take 250 mg by mouth 3 (three) times daily.  Marland Kitchen latanoprost (XALATAN) 0.005 % ophthalmic solution Place 1 drop into both eyes at bedtime.   Marland Kitchen levothyroxine (SYNTHROID, LEVOTHROID) 88 MCG tablet Take 1 tablet by mouth daily.   Marland Kitchen lisinopril-hydrochlorothiazide (PRINZIDE,ZESTORETIC) 20-25 MG tablet Take 1 tablet by mouth daily.   . mirtazapine (REMERON) 45 MG tablet Take 45 mg by mouth at bedtime.   . Nutritional Supplements (NUTRITIONAL SUPPLEMENT PO) Diet Type: Heart healthy, NAS  . OLANZapine (ZYPREXA) 2.5 MG tablet Take 2.5 mg by mouth daily as needed.  . polyethylene glycol (MIRALAX / GLYCOLAX) packet Take 17 g by mouth daily.  . potassium chloride SA (K-DUR,KLOR-CON) 20 MEQ tablet Take 20 mEq by mouth daily as needed. Take with Lasix for edema  . tamsulosin (FLOMAX) 0.4 MG CAPS capsule Take 1 capsule (0.4 mg total) by mouth daily.   No facility-administered encounter medications on file as of 10/16/2018.     Review of Systems  GENERAL: No change in appetite, no fatigue, no weight changes, no fever, chills or weakness MOUTH and THROAT: Denies oral discomfort, gingival pain or bleeding RESPIRATORY: no cough, SOB, DOE, wheezing, hemoptysis CARDIAC: No chest pain, edema or palpitations GI: No abdominal pain, diarrhea, constipation, heart burn, nausea or vomiting GU: Denies dysuria, frequency, hematuria, or discharge NEUROLOGICAL: Denies dizziness, syncope, numbness, or headache PSYCHIATRIC: Denies feelings of depression or anxiety. No report of hallucinations, insomnia, paranoia, or agitation    Immunization History  Administered Date(s) Administered  . Influenza Split 07/18/2014, 07/23/2015  . Influenza, High Dose  Seasonal PF 06/14/2017  . Influenza-Unspecified 05/25/2012, 05/31/2016, 06/01/2018  . Pneumococcal Polysaccharide-23 08/30/2004, 08/04/2017   Pertinent  Health Maintenance Due  Topic Date Due  . PNA vac Low Risk Adult (2 of 2 - PCV13) 11/08/2018 (Originally 08/04/2018)  . INFLUENZA VACCINE  Completed  . DEXA SCAN  Discontinued    Vitals:   10/16/18 1354 10/16/18 1359  BP: (!) 116/58 (!) 104/51  Pulse: 66   Resp: (!) 24   Temp: (!) 97.4 F (36.3 C)   TempSrc: Oral   SpO2: 97%   Weight: 238 lb 12.8 oz (108.3 kg)   Height: 5\' 1"  (1.549 m)    Body mass index is 45.12 kg/m.  Physical Exam  GENERAL APPEARANCE: Well nourished. In no acute distress. Morbidly obese. SKIN:  Skin is warm and dry.  MOUTH and THROAT: Lips are without lesions. Oral mucosa is moist and without lesions.  BREAST:  No mass, tenderness and discharge, no rashes RESPIRATORY: Breathing is even & unlabored, BS CTAB CARDIAC: RRR, no murmur,no extra heart sounds, BLE 2+ edema GI: Abdomen soft, normal BS,  no masses, no tenderness NEUROLOGICAL: There is no tremor. Speech is clear. Alert to self, disoriented to time and place. Right-sided weakness PSYCHIATRIC:  Affect and behavior are appropriate   Labs reviewed: Recent Labs    05/12/18 0625 06/09/18 0620 10/04/18 0600  NA 142 143 138  K 3.5 4.0 3.9  CL 104 102 103  CO2 30 31 31   GLUCOSE 93 90 90  BUN 25* 22 19  CREATININE 1.01* 0.98 0.88  CALCIUM 8.8* 8.6* 8.5*   Recent Labs    05/12/18 0625 06/09/18 0620 10/04/18 0600  AST 16 15 18   ALT 10 10 16   ALKPHOS 69 61 62  BILITOT 0.6 0.3 0.4  PROT 5.9* 5.6* 5.7*  ALBUMIN 3.3* 2.6* 3.0*   Recent Labs    01/26/18 1510  05/12/18 0625 06/09/18 0620 10/04/18 0600  WBC 6.1   < > 5.1 5.5 6.2  NEUTROABS 4.4  --   --  3.0 3.2  HGB 14.4   < > 11.8* 10.8* 11.3*  HCT 42.4   < > 35.4 34.0* 35.4*  MCV 95.1   < > 92.3 92.6 94.4  PLT 114*   < > 117* 184 143*   < > = values in this interval not displayed.     Lab Results  Component Value Date   TSH 3.168 08/07/2018   Lab Results  Component Value Date   HGBA1C 5.6 01/27/2018   Lab Results  Component Value Date   CHOL 149 01/28/2018   HDL 40 (L) 01/28/2018   LDLCALC 81 01/28/2018   TRIG 139 01/28/2018   CHOLHDL 3.7 01/28/2018    Assessment/Plan  1. Normal female breast exam - no mass, tenderness, rashes, nor discharge  2. History of CVA (cerebrovascular accident) -stable, has right-sided weakness, continue Plavix 75 mg daily, ASA 325 mg daily, Clonidine 0.3 mg/24 HR 1 patch weekly, lisinopril-hydrochlorothiazide 20-25 mg 1 tab daily and atorvastatin 80 mg 1 tab daily   Family/ staff Communication: Discussed plan of care with resident.  Labs/tests ordered:  None  Goals of care:   Long-term care.   Kenard Gower, NP [facility] [phone] (Monday-Friday 8:00 a.m. - 5:00 p.m.) [phone] (after hours)

## 2018-10-18 ENCOUNTER — Non-Acute Institutional Stay (SKILLED_NURSING_FACILITY): Payer: Medicare Other | Admitting: Adult Health

## 2018-10-18 ENCOUNTER — Encounter: Payer: Self-pay | Admitting: Adult Health

## 2018-10-18 DIAGNOSIS — R451 Restlessness and agitation: Secondary | ICD-10-CM | POA: Diagnosis not present

## 2018-10-18 NOTE — Progress Notes (Signed)
[facility] Room Number: 310-B Place of Service:  SNF (31) Provider:  Kenard Gower, NP  Patient Care Team: Lauro Regulus, MD as PCP - General (Internal Medicine) Antonieta Iba, MD as Consulting Physician (Cardiology) Sharee Holster, NP as Nurse Practitioner (Geriatric Medicine)  Extended Emergency Contact Information Primary Emergency Contact: Freda Munro Address: [address]          [place], [place] [address] Darden [place] of [place] Home Phone: [phone] Mobile Phone: [phone] Relation: Spouse Secondary Emergency Contact: Burman Foster [place] Home Phone: [phone] Mobile Phone: [phone] Relation: Daughter  Code Status:  Full Code  Goals of care: Advanced Directive information Advanced Directives 10/10/2018  Does Patient Have a Medical Advance Directive? No  Type of Advance Directive -  Does patient want to make changes to medical advance directive? No - Patient declined  Copy of Healthcare Power of Attorney in Chart? -  Would patient like information on creating a medical advance directive? No - Patient declined     Chief Complaint  Patient presents with  . Acute Visit    Patient is seen for medication management to assess the need for continued use of olanzapine.    HPI:  Pt is an 83 y.o. female seen today for medication management to assess for continued need of olanzapine. She is a long-term care resident of [facility]	[place].  She has a PMH of cerebral infarction, CKD stage III, hypercholesterolemia, HLD, stroke, OA, hypothyroidism, and HTN. She was seen in her room today. She was smiling and verbally responsive. No agitation noted. She has not used PRN Zyprexa in 2 weeks.   Past Medical History:  Diagnosis Date  . Cerebral infarction (HCC)   . CKD (chronic kidney disease), stage III (HCC)   . Depression   . DJD (degenerative joint disease)   . Edema     . High cholesterol   . Hyperlipidemia, unspecified   . Hypertension   . Hypothyroidism   . Malaise   . Morbid obesity (HCC)   . Osteoarthritis   . Parathyroid abnormality (HCC)   . Seizures (HCC)    a. following remote stroke.  . Stroke (HCC) 2002, 05/2017, 01/26/18, 01/30/18   a. 1964-->residual right arm wkns.  Previously on coumadin - pt says for just a few yrs.  . Subdural hematoma (HCC)    a. 10/2001 [facility] req temporal frontoparietal craniotomy following fall. ? whether or not pt on coumadin @ time.  Notes indicate yes but pt denies.  . Thyroid disease   . TMJ (dislocation of temporomandibular joint)   . Tuberculosis    a. ~ 1950  . Urinary incontinence   . Weight loss    Past Surgical History:  Procedure Laterality Date  . ABDOMINAL HYSTERECTOMY    . BACK SURGERY    . BRAIN SURGERY    . cataract surgery     2014  . LEFT HEART CATH AND CORONARY ANGIOGRAPHY N/A 06/13/2017   Procedure: LEFT HEART CATH AND CORONARY ANGIOGRAPHY;  Surgeon: Iran Ouch, MD;  Location: [facility] INVASIVE CV LAB;  Service: Cardiovascular;  Laterality: N/A;  . PARATHYROID ADENOMA REMOVAL    . TEE WITHOUT CARDIOVERSION N/A 01/31/2018   Procedure: TRANSESOPHAGEAL ECHOCARDIOGRAM (TEE);  Surgeon: Yvonne Kendall, MD;  Location: [facility] ORS;  Service: Cardiovascular;  Laterality: N/A;  . TEE WITHOUT CARDIOVERSION N/A 02/01/2018   Procedure: TRANSESOPHAGEAL ECHOCARDIOGRAM (TEE);  Surgeon: Antonieta Iba, MD;  Location: [facility]  ORS;  Service: Cardiovascular;  Laterality: N/A;    No Known Allergies  Outpatient Encounter Medications as of 10/18/2018  Medication Sig  . acetaminophen (TYLENOL) 325 MG tablet Take 650 mg by mouth every 4 (four) hours as needed for mild pain or moderate pain.   Marland Kitchen aspirin 325 MG tablet Take 1 tablet (325 mg total) by mouth daily.  Marland Kitchen atorvastatin (LIPITOR) 80 MG tablet Take 1 tablet (80 mg total) by mouth daily at 6 PM.  . citalopram (CELEXA) 20 MG tablet Take 20 mg by mouth daily.  .  cloNIDine (CATAPRES - DOSED IN MG/24 HR) 0.3 mg/24hr patch Place 1 patch onto the skin every Friday.   . clopidogrel (PLAVIX) 75 MG tablet Take 1 tablet (75 mg total) by mouth daily.  . cyanocobalamin 1000 MCG tablet Take 1,000 mcg by mouth daily.   . divalproex (DEPAKOTE) 250 MG DR tablet Take 250 mg by mouth 3 (three) times daily.  Marland Kitchen latanoprost (XALATAN) 0.005 % ophthalmic solution Place 1 drop into both eyes at bedtime.   Marland Kitchen levothyroxine (SYNTHROID, LEVOTHROID) 88 MCG tablet Take 1 tablet by mouth daily.   Marland Kitchen lisinopril-hydrochlorothiazide (PRINZIDE,ZESTORETIC) 20-25 MG tablet Take 1 tablet by mouth daily.   . mirtazapine (REMERON) 45 MG tablet Take 45 mg by mouth at bedtime.   . Nutritional Supplements (NUTRITIONAL SUPPLEMENT PO) Diet Type: Heart healthy, NAS  . polyethylene glycol (MIRALAX / GLYCOLAX) packet Take 17 g by mouth daily.  . potassium chloride SA (K-DUR,KLOR-CON) 20 MEQ tablet Take 20 mEq by mouth daily as needed. Take with Lasix for edema  . tamsulosin (FLOMAX) 0.4 MG CAPS capsule Take 1 capsule (0.4 mg total) by mouth daily.   No facility-administered encounter medications on file as of 10/18/2018.     Review of Systems  GENERAL: No change in appetite, no fatigue, no weight changes, no fever, chills or weakness MOUTH and THROAT: Denies oral discomfort, gingival pain or bleeding RESPIRATORY: no cough, SOB, DOE, wheezing, hemoptysis CARDIAC: No chest pain, edema or palpitations GI: No abdominal pain, diarrhea, constipation, heart burn, nausea or vomiting GU: Denies dysuria, frequency, hematuria, or discharge NEUROLOGICAL: Denies dizziness, syncope, numbness, or headache PSYCHIATRIC: Denies feelings of depression or anxiety. No report of hallucinations, insomnia, paranoia, or agitation    Immunization History  Administered Date(s) Administered  . Influenza Split 07/18/2014, 07/23/2015  . Influenza, High Dose Seasonal PF 06/14/2017  . Influenza-Unspecified 05/25/2012,  05/31/2016, 06/01/2018  . Pneumococcal Polysaccharide-23 08/30/2004, 08/04/2017   Pertinent  Health Maintenance Due  Topic Date Due  . PNA vac Low Risk Adult (2 of 2 - PCV13) 11/08/2018 (Originally 08/04/2018)  . INFLUENZA VACCINE  Completed  . DEXA SCAN  Discontinued    Vitals:   10/18/18 1216 10/18/18 1217  BP: (!) 116/58 (!) 104/51  Pulse: 66   Resp: 18   Temp: 97.9 F (36.6 C)   TempSrc: Oral   SpO2: 97%   Weight: 238 lb 12.8 oz (108.3 kg)   Height: 5\' 1"  (1.549 m)    Body mass index is 45.12 kg/m.  Physical Exam  GENERAL APPEARANCE: Well nourished. In no acute distress. Morbidly obese. SKIN:  Skin is warm and dry. MOUTH and THROAT: Lips are without lesions. Oral mucosa is moist and without lesions.  RESPIRATORY: Breathing is even & unlabored, BS CTAB CARDIAC: RRR, no murmur,no extra heart sounds, no edema GI: Abdomen soft, normal BS, no masses, no tenderness NEUROLOGICAL: There is no tremor. Speech is clear. Right-sided weakness. Alert to self, disoriented  to time and place. PSYCHIATRIC:  Affect and behavior are appropriate   Labs reviewed: Recent Labs    05/12/18 0625 06/09/18 0620 10/04/18 0600  NA 142 143 138  K 3.5 4.0 3.9  CL 104 102 103  CO2 30 31 31   GLUCOSE 93 90 90  BUN 25* 22 19  CREATININE 1.01* 0.98 0.88  CALCIUM 8.8* 8.6* 8.5*   Recent Labs    05/12/18 0625 06/09/18 0620 10/04/18 0600  AST 16 15 18   ALT 10 10 16   ALKPHOS 69 61 62  BILITOT 0.6 0.3 0.4  PROT 5.9* 5.6* 5.7*  ALBUMIN 3.3* 2.6* 3.0*   Recent Labs    01/26/18 1510  05/12/18 0625 06/09/18 0620 10/04/18 0600  WBC 6.1   < > 5.1 5.5 6.2  NEUTROABS 4.4  --   --  3.0 3.2  HGB 14.4   < > 11.8* 10.8* 11.3*  HCT 42.4   < > 35.4 34.0* 35.4*  MCV 95.1   < > 92.3 92.6 94.4  PLT 114*   < > 117* 184 143*   < > = values in this interval not displayed.   Lab Results  Component Value Date   TSH 3.168 08/07/2018   Lab Results  Component Value Date   HGBA1C 5.6 01/27/2018    Lab Results  Component Value Date   CHOL 149 01/28/2018   HDL 40 (L) 01/28/2018   LDLCALC 81 01/28/2018   TRIG 139 01/28/2018   CHOLHDL 3.7 01/28/2018    Assessment/Plan  1. Agitation - has not taken Zyprexa in 2 weeks, no reported agitation, will discontinue Zyprexa   Family/ staff Communication: Discussed plan of care with resident.  Labs/tests ordered:  None  Goals of care:   Long-term care.   Kenard GowerMonina Medina-Vargas, NP [facility] [phone] (Monday-Friday 8:00 a.m. - 5:00 p.m.) [phone] (after hours)

## 2018-10-30 ENCOUNTER — Encounter
Admission: RE | Admit: 2018-10-30 | Discharge: 2018-10-30 | Disposition: A | Discharge status: Home / Self Care | Payer: Medicare Other | Source: Ambulatory Visit | Attending: Internal Medicine | Admitting: Internal Medicine

## 2018-10-30 DIAGNOSIS — N189 Chronic kidney disease, unspecified: Secondary | ICD-10-CM | POA: Insufficient documentation

## 2018-10-30 DIAGNOSIS — I130 Hypertensive heart and chronic kidney disease with heart failure and stage 1 through stage 4 chronic kidney disease, or unspecified chronic kidney disease: Secondary | ICD-10-CM | POA: Insufficient documentation

## 2018-11-02 ENCOUNTER — Non-Acute Institutional Stay (SKILLED_NURSING_FACILITY): Payer: Medicare Other | Admitting: Adult Health

## 2018-11-02 ENCOUNTER — Encounter: Payer: Self-pay | Admitting: Adult Health

## 2018-11-02 DIAGNOSIS — I10 Essential (primary) hypertension: Secondary | ICD-10-CM | POA: Diagnosis not present

## 2018-11-02 DIAGNOSIS — Z8673 Personal history of transient ischemic attack (TIA), and cerebral infarction without residual deficits: Secondary | ICD-10-CM | POA: Diagnosis not present

## 2018-11-02 DIAGNOSIS — E034 Atrophy of thyroid (acquired): Secondary | ICD-10-CM | POA: Diagnosis not present

## 2018-11-02 DIAGNOSIS — F0150 Vascular dementia without behavioral disturbance: Secondary | ICD-10-CM

## 2018-11-02 DIAGNOSIS — R339 Retention of urine, unspecified: Secondary | ICD-10-CM | POA: Diagnosis not present

## 2018-11-02 DIAGNOSIS — F321 Major depressive disorder, single episode, moderate: Secondary | ICD-10-CM

## 2018-11-02 DIAGNOSIS — F39 Unspecified mood [affective] disorder: Secondary | ICD-10-CM

## 2018-11-02 NOTE — Progress Notes (Signed)
[facility]  [facility] Room Number: 310-B Place of Service:  SNF (31) Provider:  Kenard Gower, NP  Patient Care Team: Lauro Regulus, MD as PCP - General (Internal Medicine) Antonieta Iba, MD as Consulting Physician (Cardiology) Sharee Holster, NP as Nurse Practitioner (Geriatric Medicine)  Extended Emergency Contact Information Primary Emergency Contact: Freda Munro Address: [address]          [place], [place] [address] Darden Amber of [place] Home Phone: [phone] Mobile Phone: [phone] Relation: Spouse Secondary Emergency Contact: Burman Foster [place] Home Phone: [phone] Mobile Phone: [phone] Relation: Daughter  Code Status:  Full Code  Goals of care: Advanced Directive information Advanced Directives 10/10/2018  Does Patient Have a Medical Advance Directive? No  Type of Advance Directive -  Does patient want to make changes to medical advance directive? No - Patient declined  Copy of Healthcare Power of Attorney in Chart? -  Would patient like information on creating a medical advance directive? No - Patient declined     Chief Complaint  Patient presents with  . Medical Management of Chronic Issues    Routine [facility] visit    HPI:  Pt is an 83 y.o. female seen today for medical management of chronic diseases.  She is a long-term care resident of [facility]	[place]. She has a PMH of cerebral infarction, CKD stage III, hypercholesterolemia, HLD, stroke, OA, hypothyroidism, and HTN. She was seen in her room today. She is pleasantly confused. BPs are inconsistent, 157/77, 116/58, 104/51.   Past Medical History:  Diagnosis Date  . Cerebral infarction (HCC)   . CKD (chronic kidney disease), stage III (HCC)   . Depression   . DJD (degenerative joint disease)   . Edema   . High cholesterol   . Hyperlipidemia, unspecified   . Hypertension   . Hypothyroidism    . Malaise   . Morbid obesity (HCC)   . Osteoarthritis   . Parathyroid abnormality (HCC)   . Seizures (HCC)    a. following remote stroke.  . Stroke (HCC) 2002, 05/2017, 01/26/18, 01/30/18   a. 1964-->residual right arm wkns.  Previously on coumadin - pt says for just a few yrs.  . Subdural hematoma (HCC)    a. 10/2001 [facility] req temporal frontoparietal craniotomy following fall. ? whether or not pt on coumadin @ time.  Notes indicate yes but pt denies.  . Thyroid disease   . TMJ (dislocation of temporomandibular joint)   . Tuberculosis    a. ~ 1950  . Urinary incontinence   . Weight loss    Past Surgical History:  Procedure Laterality Date  . ABDOMINAL HYSTERECTOMY    . BACK SURGERY    . BRAIN SURGERY    . cataract surgery     2014  . LEFT HEART CATH AND CORONARY ANGIOGRAPHY N/A 06/13/2017   Procedure: LEFT HEART CATH AND CORONARY ANGIOGRAPHY;  Surgeon: Iran Ouch, MD;  Location: [facility] INVASIVE CV LAB;  Service: Cardiovascular;  Laterality: N/A;  . PARATHYROID ADENOMA REMOVAL    . TEE WITHOUT CARDIOVERSION N/A 01/31/2018   Procedure: TRANSESOPHAGEAL ECHOCARDIOGRAM (TEE);  Surgeon: Yvonne Kendall, MD;  Location: [facility] ORS;  Service: Cardiovascular;  Laterality: N/A;  . TEE WITHOUT CARDIOVERSION N/A 02/01/2018   Procedure: TRANSESOPHAGEAL ECHOCARDIOGRAM (TEE);  Surgeon: Antonieta Iba, MD;  Location: [facility] ORS;  Service: Cardiovascular;  Laterality: N/A;    No Known Allergies  Outpatient Encounter Medications as of 11/02/2018  Medication Sig  . acetaminophen (TYLENOL) 325 MG tablet Take 650 mg by mouth every 4 (four) hours as needed for mild pain or moderate pain.   Marland Kitchen. aspirin 325 MG tablet Take 1 tablet (325 mg total) by mouth daily.  Marland Kitchen. atorvastatin (LIPITOR) 80 MG tablet Take 1 tablet (80 mg total) by mouth daily at 6 PM.  . citalopram (CELEXA) 20 MG tablet Take 20 mg by mouth daily.   . cloNIDine (CATAPRES - DOSED IN MG/24 HR) 0.3 mg/24hr patch Place 1 patch onto the skin every  Friday.   . clopidogrel (PLAVIX) 75 MG tablet Take 1 tablet (75 mg total) by mouth daily.  . cyanocobalamin 1000 MCG tablet Take 1,000 mcg by mouth daily.   . divalproex (DEPAKOTE) 250 MG DR tablet Take 250 mg by mouth 3 (three) times daily.  Marland Kitchen. latanoprost (XALATAN) 0.005 % ophthalmic solution Place 1 drop into both eyes at bedtime.   Marland Kitchen. levothyroxine (SYNTHROID, LEVOTHROID) 88 MCG tablet Take 1 tablet by mouth daily.   Marland Kitchen. lisinopril-hydrochlorothiazide (PRINZIDE,ZESTORETIC) 20-25 MG tablet Take 1 tablet by mouth daily.   . mirtazapine (REMERON) 45 MG tablet Take 45 mg by mouth at bedtime.   . Nutritional Supplements (NUTRITIONAL SUPPLEMENT PO) Diet Type: Heart healthy, NAS  . polyethylene glycol (MIRALAX / GLYCOLAX) packet Take 17 g by mouth daily.  . potassium chloride SA (K-DUR,KLOR-CON) 20 MEQ tablet Take 20 mEq by mouth daily as needed. Take with Lasix for edema  . tamsulosin (FLOMAX) 0.4 MG CAPS capsule Take 1 capsule (0.4 mg total) by mouth daily.   No facility-administered encounter medications on file as of 11/02/2018.     Review of Systems  Unable to obtain due to Vascular dementia    Immunization History  Administered Date(s) Administered  . Influenza Split 07/18/2014, 07/23/2015  . Influenza, High Dose Seasonal PF 06/14/2017  . Influenza-Unspecified 05/25/2012, 05/31/2016, 06/01/2018  . Pneumococcal Polysaccharide-23 08/30/2004, 08/04/2017   Pertinent  Health Maintenance Due  Topic Date Due  . PNA vac Low Risk Adult (2 of 2 - PCV13) 11/08/2018 (Originally 08/04/2018)  . INFLUENZA VACCINE  Completed  . DEXA SCAN  Discontinued    Vitals:   11/02/18 1545  BP: (!) 142/73  Pulse: 70  Resp: 18  Temp: 98.2 F (36.8 C)  TempSrc: Oral  SpO2: 97%  Weight: 240 lb 4.8 oz (109 kg)  Height: 5\' 1"  (1.549 m)   Body mass index is 45.4 kg/m.  Physical Exam  GENERAL APPEARANCE: Well nourished. In no acute distress. Morbidly obese SKIN:  Skin is warm and dry.  MOUTH and  THROAT: Lips are without lesions. Oral mucosa is moist and without lesions. RESPIRATORY: Breathing is even & unlabored, BS CTAB CARDIAC: RRR, no murmur,no extra heart sounds, no edema GI: Abdomen soft, normal BS, no masses, no tenderness EXTREMITIES:  RLE is spastic NEUROLOGICAL: There is no tremor. Speech is clear. Alert to self, disoriented to time and place. PSYCHIATRIC:  Affect and behavior are appropriate  Labs reviewed: Recent Labs    05/12/18 0625 06/09/18 0620 10/04/18 0600  NA 142 143 138  K 3.5 4.0 3.9  CL 104 102 103  CO2 30 31 31   GLUCOSE 93 90 90  BUN 25* 22 19  CREATININE 1.01* 0.98 0.88  CALCIUM 8.8* 8.6* 8.5*   Recent Labs    05/12/18 0625 06/09/18 0620 10/04/18 0600  AST 16 15 18   ALT 10 10 16   ALKPHOS 69 61 62  BILITOT 0.6 0.3 0.4  PROT  5.9* 5.6* 5.7*  ALBUMIN 3.3* 2.6* 3.0*   Recent Labs    01/26/18 1510  05/12/18 0625 06/09/18 0620 10/04/18 0600  WBC 6.1   < > 5.1 5.5 6.2  NEUTROABS 4.4  --   --  3.0 3.2  HGB 14.4   < > 11.8* 10.8* 11.3*  HCT 42.4   < > 35.4 34.0* 35.4*  MCV 95.1   < > 92.3 92.6 94.4  PLT 114*   < > 117* 184 143*   < > = values in this interval not displayed.   Lab Results  Component Value Date   TSH 3.168 08/07/2018   Lab Results  Component Value Date   HGBA1C 5.6 01/27/2018   Lab Results  Component Value Date   CHOL 149 01/28/2018   HDL 40 (L) 01/28/2018   LDLCALC 81 01/28/2018   TRIG 139 01/28/2018   CHOLHDL 3.7 01/28/2018    Assessment/Plan  1. History of CVA (cerebrovascular accident) -Stable, continue Plavix 75 mg 1 tab daily atorvastatin 80 mg 1 tab daily, clonidine 0.3 mg / 24-hour patch transdermal daily and lisinopril- hydrochlorothiazide's 20-25 mg 1 tab daily  2. Chronic retention of urine -No reported urinary retention, continue tamsulosin 0.4 mg 1 capsule daily  3. Essential hypertension -BPs inconsistent, will check BP twice daily x2 weeks, continue clonidine 0.3 mg / 24-hour patch  transdermal daily and lisinopril-hydrochlorothiazide 20-25 mg 1 tab daily  4. Hypothyroidism due to acquired atrophy of thyroid Lab Results  Component Value Date   TSH 3.168 08/07/2018  -Continue levothyroxine 88 mcg 1 tab daily  5. Mood disorder (HCC) -Mood is stable, continue Depakote delayed release 250 mg 1 tablet 3 times a day  6. Depression, major, single episode, moderate (HCC) - smiling during the visit, continue Celexa 20 mg 1 tablet daily and mirtazapine 45 mg 1 tablet at bedtime  7. Vascular dementia without behavioral disturbance (HCC) -Continue supportive care, fall precautions   Family/ staff Communication: Discussed plan of care with resident and charge nurse.  Labs/tests ordered: None  Goals of care:   Long-term care.   Kenard Gower, NP [facility] [phone] (Monday-Friday 8:00 a.m. - 5:00 p.m.) [phone] (after hours)

## 2018-11-21 ENCOUNTER — Encounter: Payer: Self-pay | Admitting: Adult Health

## 2018-11-21 ENCOUNTER — Non-Acute Institutional Stay (SKILLED_NURSING_FACILITY): Payer: Medicare Other | Admitting: Adult Health

## 2018-11-21 DIAGNOSIS — F325 Major depressive disorder, single episode, in full remission: Secondary | ICD-10-CM

## 2018-11-21 DIAGNOSIS — F39 Unspecified mood [affective] disorder: Secondary | ICD-10-CM | POA: Diagnosis not present

## 2018-11-21 NOTE — Progress Notes (Signed)
[facility] Room Number: 310-B Place of Service:  SNF (31) Provider:  Kenard Gower, NP  Patient Care Team: Shawna Regulus, MD as PCP - General (Internal Medicine) Shawna Iba, MD as Consulting Physician (Cardiology) Shawna Holster, NP as Nurse Practitioner (Geriatric Medicine)  Extended Emergency Contact Information Primary Emergency Contact: Freda [name] Address: [address]          [place], [place] [address] D[place] of [place] Home Phone: [phone] Mobile Phone: [phone] Relation: Spouse Secondary Emergency Contact: Shawna [name] [place] of [place] Home Phone: [phone] Mobile Phone: [phone] Relation: Daughter  Code Status:  Full Code  Goals of care: Advanced Directive information Advanced Directives 10/10/2018  Does Patient Have a Medical Advance Directive? No  Type of Advance Directive -  Does patient want to make changes to medical advance directive? No - Patient declined  Copy of Healthcare Power of Attorney in Chart? -  Would patient like information on creating a medical advance directive? No - Patient declined     Chief Complaint  Patient presents with  . Acute Visit    Patient is seen for GDR.    HPI:  Pt is an 83 y.o. female seen today for an acute visit for GDR. She is a long-term care resident of [facility]	[place]. She has a PMH of cerebral infarction, CKD stage III, hypercholesterolemia, HLD, stroke, OA, hypothyroidism, and hypertension. She was seen today. She was smiling and interactive. She has been taking Remeron 45 mg daily since 02/25/18 for depression. She is, also, taking Celexa 20 mg daily for depression and Depakote 250 mg TID for Mood disorder. No reported hallucinations nor crying episode. Pharmacy has recommended a gradual dose reduction (GDR) of psychotropics.   Past Medical History:  Diagnosis Date  . Cerebral infarction (HCC)   . CKD  (chronic kidney disease), stage III (HCC)   . Depression   . DJD (degenerative joint disease)   . Edema   . High cholesterol   . Hyperlipidemia, unspecified   . Hypertension   . Hypothyroidism   . Malaise   . Morbid obesity (HCC)   . Osteoarthritis   . Parathyroid abnormality (HCC)   . Seizures (HCC)    a. following remote stroke.  . Stroke (HCC) 2002, 05/2017, 01/26/18, 01/30/18   a. 1964-->residual right arm wkns.  Previously on coumadin - pt says for just a few yrs.  . Subdural hematoma (HCC)    a. 10/2001 [facility] req temporal frontoparietal craniotomy following fall. ? whether or not pt on coumadin @ time.  Notes indicate yes but pt denies.  . Thyroid disease   . TMJ (dislocation of temporomandibular joint)   . Tuberculosis    a. ~ 1950  . Urinary incontinence   . Weight loss    Past Surgical History:  Procedure Laterality Date  . ABDOMINAL HYSTERECTOMY    . BACK SURGERY    . BRAIN SURGERY    . cataract surgery     2014  . LEFT HEART CATH AND CORONARY ANGIOGRAPHY N/A 06/13/2017   Procedure: LEFT HEART CATH AND CORONARY ANGIOGRAPHY;  Surgeon: Iran Ouch, MD;  Location: [facility] INVASIVE CV LAB;  Service: Cardiovascular;  Laterality: N/A;  . PARATHYROID ADENOMA REMOVAL    . TEE WITHOUT CARDIOVERSION N/A 01/31/2018   Procedure: TRANSESOPHAGEAL ECHOCARDIOGRAM (TEE);  Surgeon: Yvonne Kendall, MD;  Location: [facility] ORS;  Service: Cardiovascular;  Laterality: N/A;  . TEE WITHOUT CARDIOVERSION N/A 02/01/2018  Procedure: TRANSESOPHAGEAL ECHOCARDIOGRAM (TEE);  Surgeon: Shawna Iba, MD;  Location: [facility] ORS;  Service: Cardiovascular;  Laterality: N/A;    No Known Allergies  Outpatient Encounter Medications as of 11/21/2018  Medication Sig  . acetaminophen (TYLENOL) 325 MG tablet Take 650 mg by mouth every 4 (four) hours as needed for mild pain or moderate pain.   Marland Kitchen aspirin 325 MG tablet Take 1 tablet (325 mg total) by mouth daily.  Marland Kitchen atorvastatin (LIPITOR) 80 MG tablet Take 1  tablet (80 mg total) by mouth daily at 6 PM.  . citalopram (CELEXA) 20 MG tablet Take 20 mg by mouth daily.   . cloNIDine (CATAPRES - DOSED IN MG/24 HR) 0.3 mg/24hr patch Place 1 patch onto the skin every Friday.   . clopidogrel (PLAVIX) 75 MG tablet Take 1 tablet (75 mg total) by mouth daily.  . cyanocobalamin 1000 MCG tablet Take 1,000 mcg by mouth daily.   . divalproex (DEPAKOTE) 250 MG DR tablet Take 250 mg by mouth 3 (three) times daily.  Marland Kitchen latanoprost (XALATAN) 0.005 % ophthalmic solution Place 1 drop into both eyes at bedtime.   Marland Kitchen levothyroxine (SYNTHROID, LEVOTHROID) 88 MCG tablet Take 1 tablet by mouth daily.   Marland Kitchen lisinopril-hydrochlorothiazide (PRINZIDE,ZESTORETIC) 20-25 MG tablet Take 1 tablet by mouth daily.   . mirtazapine (REMERON) 30 MG tablet Take 30 mg by mouth at bedtime.  . Nutritional Supplements (NUTRITIONAL SUPPLEMENT PO) Diet Type: Heart healthy, NAS  . polyethylene glycol (MIRALAX / GLYCOLAX) packet Take 17 g by mouth daily.  . potassium chloride SA (K-DUR,KLOR-CON) 20 MEQ tablet Take 20 mEq by mouth daily as needed. Take with Lasix for edema  . tamsulosin (FLOMAX) 0.4 MG CAPS capsule Take 1 capsule (0.4 mg total) by mouth daily.  . [DISCONTINUED] mirtazapine (REMERON) 45 MG tablet Take 45 mg by mouth at bedtime.    No facility-administered encounter medications on file as of 11/21/2018.     Review of Systems  Unable to obtain due to vascular dementia    Immunization History  Administered Date(s) Administered  . Influenza Split 07/18/2014, 07/23/2015  . Influenza, High Dose Seasonal PF 06/14/2017  . Influenza-Unspecified 05/25/2012, 05/31/2016, 06/01/2018  . Pneumococcal Polysaccharide-23 08/30/2004, 08/04/2017   Pertinent  Health Maintenance Due  Topic Date Due  . PNA vac Low Risk Adult (2 of 2 - PCV13) 08/04/2018  . INFLUENZA VACCINE  Completed  . DEXA SCAN  Discontinued    Vitals:   11/21/18 1000  BP: (!) 131/53  Pulse: 70  Resp: (!) 24  Temp: 98.1  F (36.7 C)  TempSrc: Oral  SpO2: 96%  Weight: 235 lb 3.2 oz (106.7 kg)  Height:  (1.549 m)   Body mass index is 44.44 kg/m.  Physical Exam  GENERAL APPEARANCE: Well nourished. In no acute distress. Morbidly obese SKIN:  Skin is warm and dry.  MOUTH and THROAT: Lips are without lesions. Oral mucosa is moist and without lesions. RESPIRATORY: Breathing is even & unlabored, BS CTAB CARDIAC: RRR, no murmur,no extra heart sounds, BLE trace edema GI: Abdomen soft, normal BS, no masses, no tenderness EXTREMITIES:  Able to move X 4 extremities NEUROLOGICAL: There is no tremor. Speech is clear. Alert to self, disoriented to time and place. PSYCHIATRIC:  Affect and behavior are appropriate   Labs reviewed: Recent Labs    05/12/18 0625 06/09/18 0620 10/04/18 0600  NA 142 143 138  K 3.5 4.0 3.9  CL 104 102 103  CO2 GLUCOSE  93 90 90  BUN 25* 22 19  CREATININE 1.01* 0.98 0.88  CALCIUM 8.8* 8.6* 8.5*   Recent Labs    05/12/18 0625 06/09/18 0620 10/04/18 0600  AST 16 15 18   ALT 10 10 16   ALKPHOS 69 61 62  BILITOT 0.6 0.3 0.4  PROT 5.9* 5.6* 5.7*  ALBUMIN 3.3* 2.6* 3.0*   Recent Labs    01/26/18 1510  05/12/18 0625 06/09/18 0620 10/04/18 0600  WBC 6.1   < > 5.1 5.5 6.2  NEUTROABS 4.4  --   --  3.0 3.2  HGB 14.4   < > 11.8* 10.8* 11.3*  HCT 42.4   < > 35.4 34.0* 35.4*  MCV 95.1   < > 92.3 92.6 94.4  PLT 114*   < > 117* 184 143*   < > = values in this interval not displayed.   Lab Results  Component Value Date   TSH 3.168 08/07/2018   Lab Results  Component Value Date   HGBA1C 5.6 01/27/2018   Lab Results  Component Value Date   CHOL 149 01/28/2018   HDL 40 (L) 01/28/2018   LDLCALC 81 01/28/2018   TRIG 139 01/28/2018   CHOLHDL 3.7 01/28/2018   Assessment/Plan  1. Depression, major, single episode, complete remission (HCC) - stable, will decrease Remeron from 45 mg to 30 mg daily, continue Celexa 20 mg daily  2. Mood disorder (HCC) -  mood is stable, will continue Depakote 250 mg TID and continue to monitor behavior    Family/ staff Communication: Discussed plan of care with resident and charge nurse.  Labs/tests ordered:  None  Goals of care:   Long-term care.   Shawna Gower, NP [facility] [phone] (Monday-Friday 8:00 a.m. - 5:00 p.m.) [phone] (after hours)

## 2018-11-29 ENCOUNTER — Encounter
Admission: RE | Admit: 2018-11-29 | Discharge: 2018-11-29 | Disposition: A | Discharge status: Home / Self Care | Payer: Medicare Other | Source: Ambulatory Visit | Attending: Internal Medicine | Admitting: Internal Medicine

## 2018-11-29 DIAGNOSIS — I130 Hypertensive heart and chronic kidney disease with heart failure and stage 1 through stage 4 chronic kidney disease, or unspecified chronic kidney disease: Secondary | ICD-10-CM | POA: Insufficient documentation

## 2018-11-29 DIAGNOSIS — N189 Chronic kidney disease, unspecified: Secondary | ICD-10-CM | POA: Insufficient documentation

## 2018-11-30 IMAGING — CT CT HEAD W/O CM
3 series · 15 of 47 positions shown, 18 images · non-contrast
Comparison: 06/11/2015

CLINICAL DATA: Pt c/o bilateral leg pain [DATE]. Both legs are
swollen, red, and warm. Pt seen at doctors office yesterday for
same. Pt also c.o headache [DATE]. HX stroke and brain surgery. TKV

EXAM:
CT HEAD WITHOUT CONTRAST
TECHNIQUE: Contiguous axial images were obtained from the base of the skull
through the vertex without intravenous contrast.

[Series 2: head wo · axial · 0.43mm/px · z∈[-161,-31]mm · 9 of 32 slices shown, 12 images]
[im 3/32  brain]
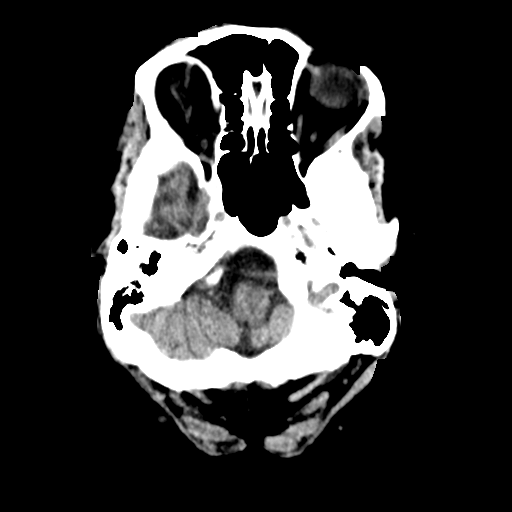
[im 3/32  bone]
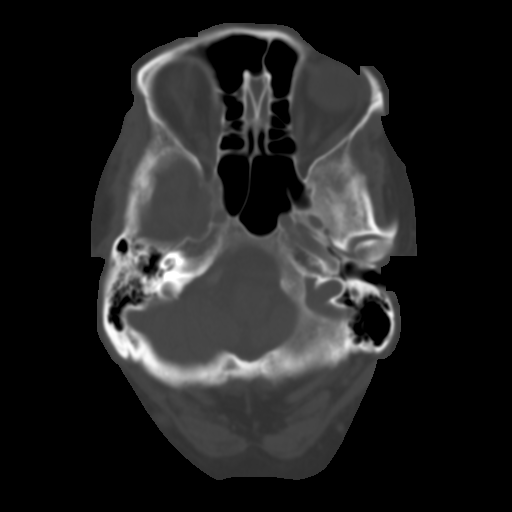
[im 6/32  brain]
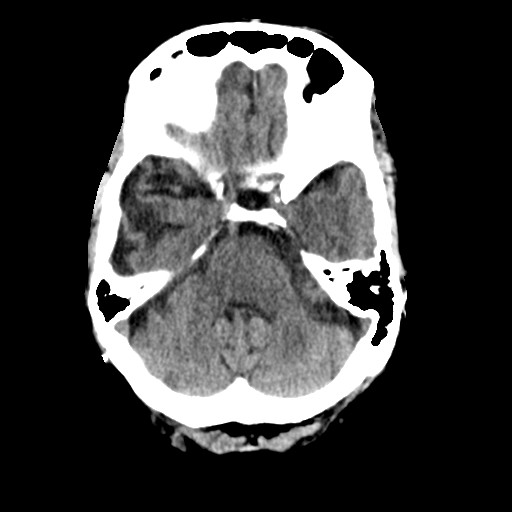
[im 9/32  brain]
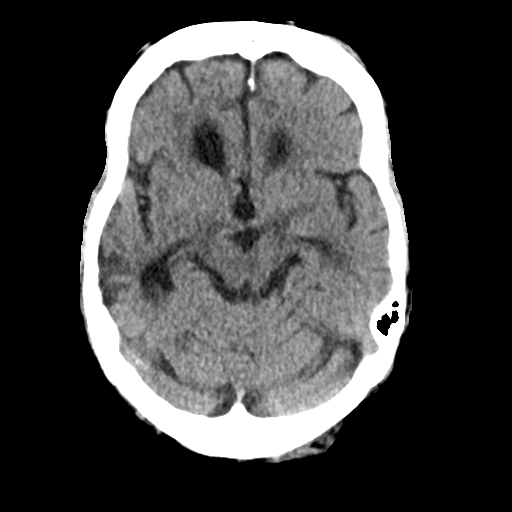
[im 12/32  brain]
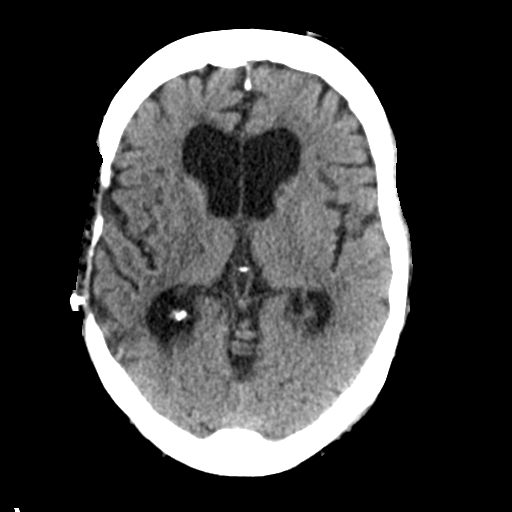
[im 17/32  brain]
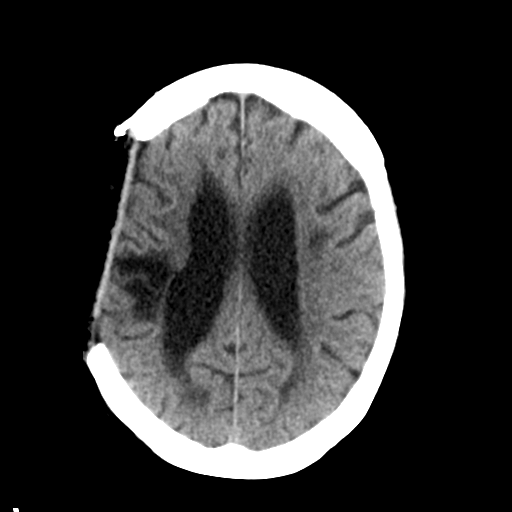
[im 17/32  bone]
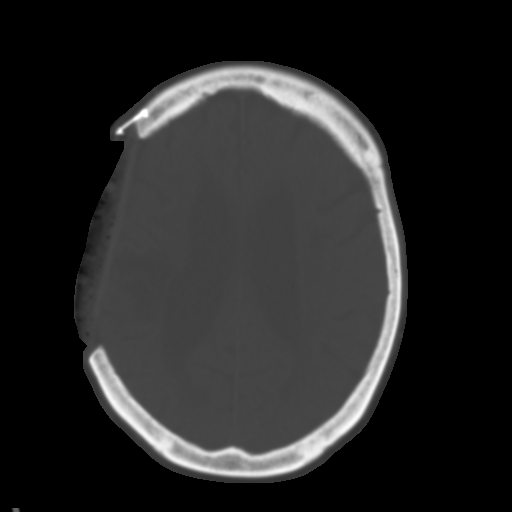
[im 20/32  brain]
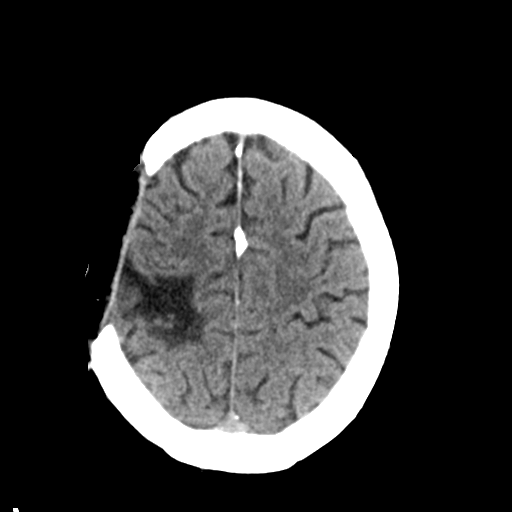
[im 23/32  brain]
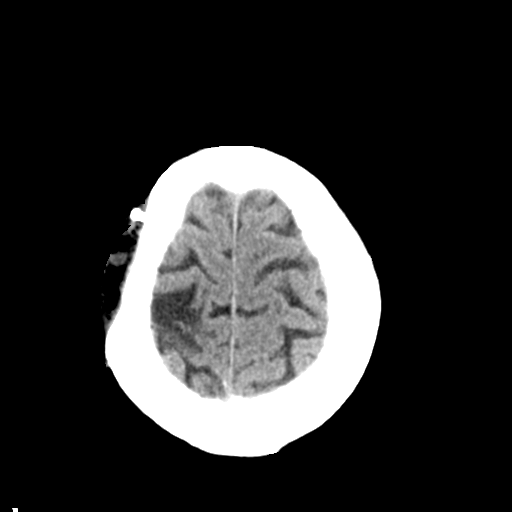
[im 26/32  brain]
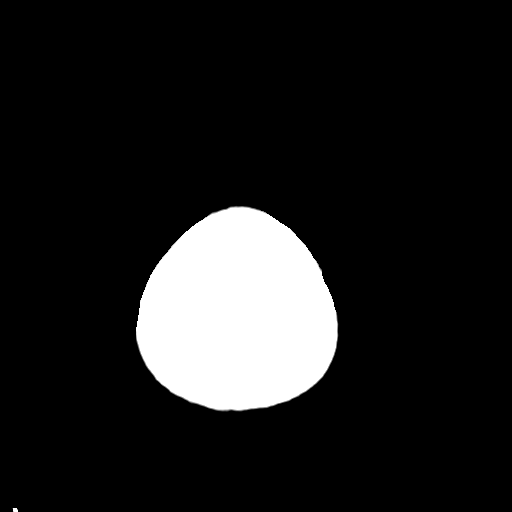
[im 29/32  brain]
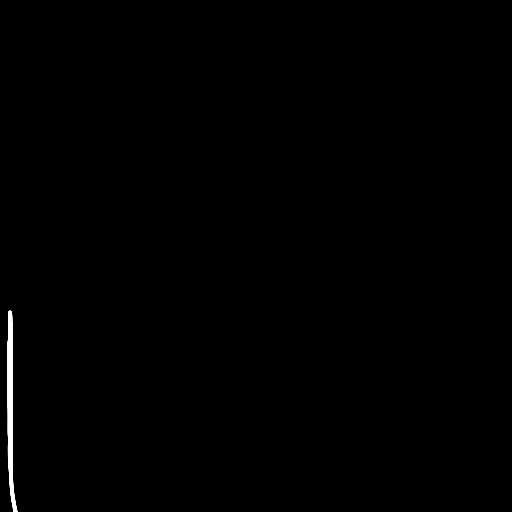
[im 29/32  bone]
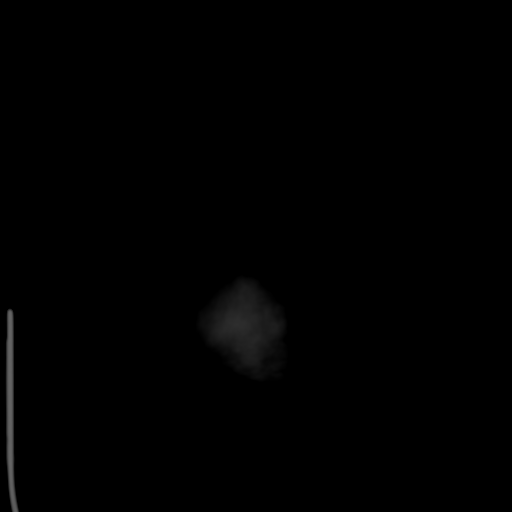

[Series 4: coronal soft tissue · coronal · 0.33mm/px · 3 of 67 slices shown]
[im 23/67  brain]
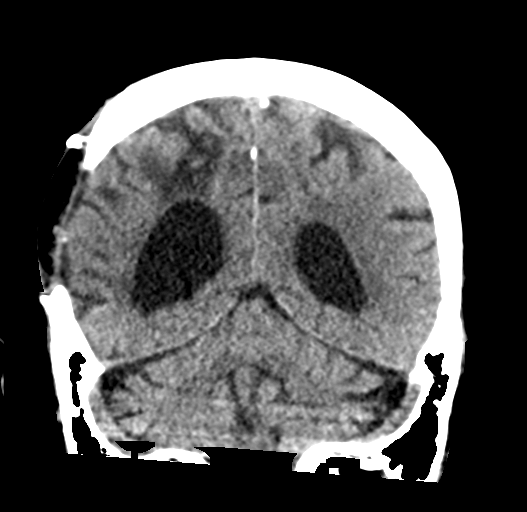
[im 30/67  brain]
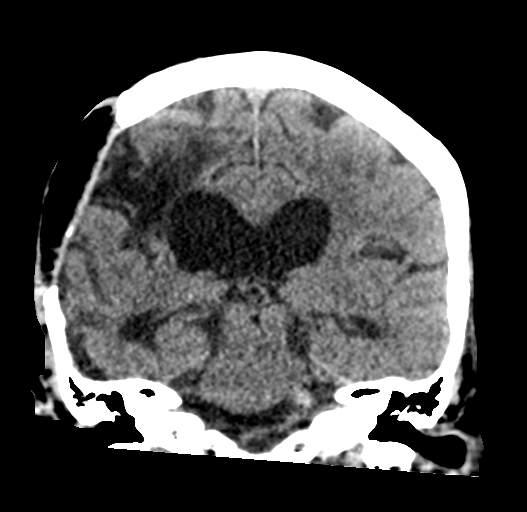
[im 37/67  brain]
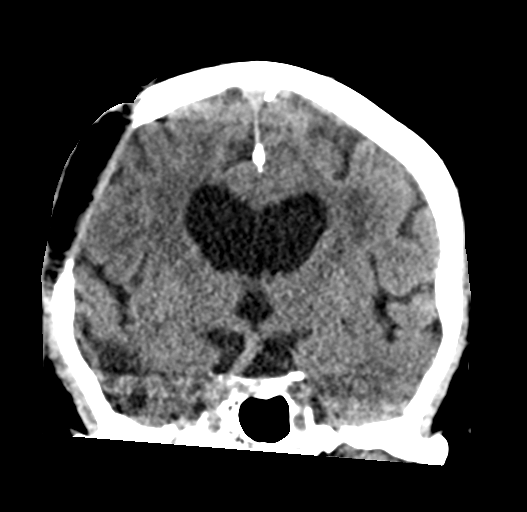

[Series 5: sagittal soft tissue · sagittal · 0.30mm/px · 3 of 55 slices shown]
[im 19/55  brain]
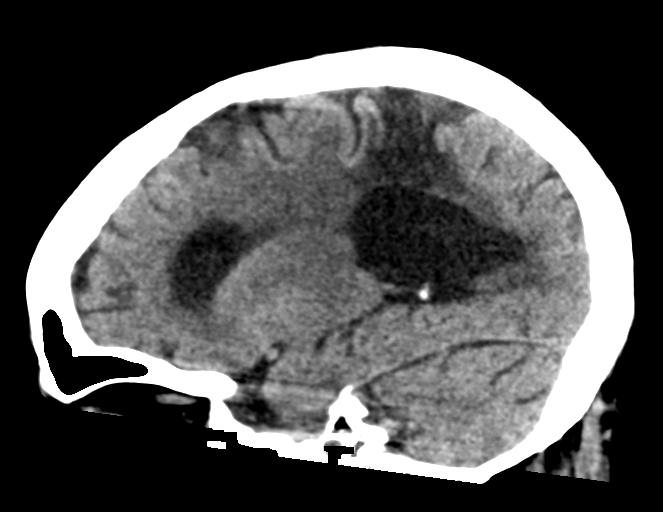
[im 28/55  brain]
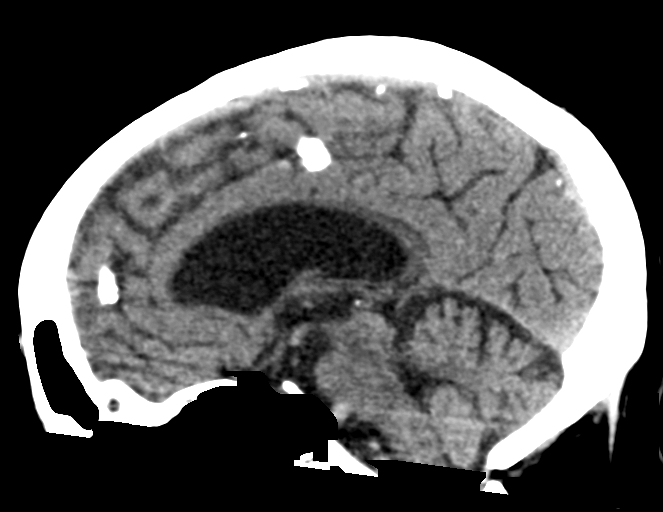
[im 37/55  brain]
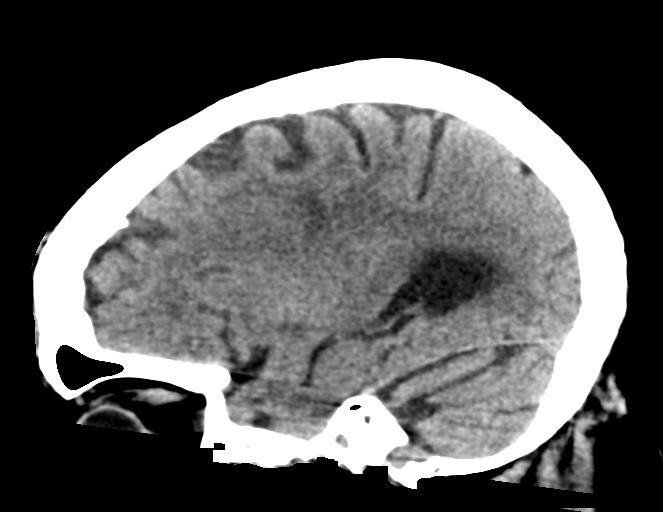

[15 of 47 positions shown; findings below may reference images not displayed]

FINDINGS: Brain: There changes from a right frontoparietal craniectomy. Focal
encephalomalacia is noted of the underlying posterior right frontal
and adjacent right parietal lobes.

There are no parenchymal masses or mass effect. There is no evidence
of a recent infarct. There is no intracranial hemorrhage.

There is an old subcortical white matter infarct involving the
lateral left frontal lobe.

Ventricles are enlarged, to a greater degree than the sulci, similar
to the prior CT. No convincing hydrocephalus.

No extra-axial masses or abnormal fluid collections.

Vascular: No hyperdense vessel or unexpected calcification.

Skull: No acute fracture or skull lesion.

Sinuses/Orbits: Visualize globes and orbits are unremarkable.
Visualized sinuses and mastoid air cells are clear.

Other: None.
IMPRESSION: 1. No acute intracranial abnormalities.
2. Changes from a previous right frontal parietal craniectomy and
resection of a portion of the posterior right frontal lobe adjacent
parietal lobes.
3. Small area of old left frontal lobe subcortical white matter
infarction.
4. Stable ventriculomegaly without convincing hydrocephalus.

## 2018-12-07 ENCOUNTER — Non-Acute Institutional Stay (SKILLED_NURSING_FACILITY): Payer: Medicare Other | Admitting: Adult Health

## 2018-12-07 ENCOUNTER — Encounter: Payer: Self-pay | Admitting: Adult Health

## 2018-12-07 DIAGNOSIS — E034 Atrophy of thyroid (acquired): Secondary | ICD-10-CM | POA: Diagnosis not present

## 2018-12-07 DIAGNOSIS — R339 Retention of urine, unspecified: Secondary | ICD-10-CM | POA: Diagnosis not present

## 2018-12-07 DIAGNOSIS — I10 Essential (primary) hypertension: Secondary | ICD-10-CM | POA: Diagnosis not present

## 2018-12-07 DIAGNOSIS — F01518 Vascular dementia, unspecified severity, with other behavioral disturbance: Secondary | ICD-10-CM

## 2018-12-07 DIAGNOSIS — Z8673 Personal history of transient ischemic attack (TIA), and cerebral infarction without residual deficits: Secondary | ICD-10-CM | POA: Diagnosis not present

## 2018-12-07 DIAGNOSIS — F39 Unspecified mood [affective] disorder: Secondary | ICD-10-CM

## 2018-12-07 DIAGNOSIS — F325 Major depressive disorder, single episode, in full remission: Secondary | ICD-10-CM

## 2018-12-07 DIAGNOSIS — F0151 Vascular dementia with behavioral disturbance: Secondary | ICD-10-CM

## 2018-12-07 NOTE — Progress Notes (Signed)
[facility] Room Number: 310B Place of Service:  SNF ([phone]) Provider:  Kenard Gower, NP  Patient Care Team: Lauro Regulus, MD as PCP - General (Internal Medicine) Mariah Milling Tollie Pizza, MD as Consulting Physician (Cardiology) Sharee Holster, NP as Nurse Practitioner (Geriatric Medicine)  Extended Emergency Contact Information Primary Emergency Contact: Freda Munro Address: [address]          [place], [place] [address] Darden [place] of [place] Home Phone: [phone] Mobile Phone: [phone] Relation: Spouse Secondary Emergency Contact: Burman Foster [place] of [place] Home Phone: [phone] Mobile Phone: [phone] Relation: Daughter  Code Status:  FULL  Goals of care: Advanced Directive information Advanced Directives 12/07/2018  Does Patient Have a Medical Advance Directive? No  Type of Advance Directive -  Does patient want to make changes to medical advance directive? No - Patient declined  Copy of Healthcare Power of Attorney in Chart? -  Would patient like information on creating a medical advance directive? -     Chief Complaint  Patient presents with  . Medical Management of Chronic Issues    Routine Visit    HPI:   Pt is a 83 y.o. female seen today for medical management of chronic diseases. She has PMH of cerebral infarction, hyperlipidemia, hypothyroidism and hypertension. She was seen in her room today. She is [leasantly confused. 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 She was noted to be in good mood. No inappropriate behavior noted. BPs noted to be stable -139/61, 144/62, 146/71, 130/57, 137/52.   Past Medical History:  Diagnosis Date  . Cerebral infarction (HCC)   . CKD (chronic kidney disease), stage III (HCC)   . Depression   . DJD (degenerative joint disease)   . Edema   . High cholesterol   . Hyperlipidemia, unspecified   . Hypertension   .  Hypothyroidism   . Malaise   . Morbid obesity (HCC)   . Osteoarthritis   . Parathyroid abnormality (HCC)   . Seizures (HCC)    a. following remote stroke.  . Stroke (HCC) 2002, 05/2017, 01/26/18, 01/30/18   a. 1964-->residual right arm wkns.  Previously on coumadin - pt says for just a few yrs.  . Subdural hematoma (HCC)    a. 10/2001 [facility] req temporal frontoparietal craniotomy following fall. ? whether or not pt on coumadin @ time.  Notes indicate yes but pt denies.  . Thyroid disease   . TMJ (dislocation of temporomandibular joint)   . Tuberculosis    a. ~ 1950  . Urinary incontinence   . Weight loss    Past Surgical History:  Procedure Laterality Date  . ABDOMINAL HYSTERECTOMY    . BACK SURGERY    . BRAIN SURGERY    . cataract surgery     2014  . LEFT HEART CATH AND CORONARY ANGIOGRAPHY N/A 06/13/2017   Procedure: LEFT HEART CATH AND CORONARY ANGIOGRAPHY;  Surgeon: Iran Ouch, MD;  Location: [facility] INVASIVE CV LAB;  Service: Cardiovascular;  Laterality: N/A;  . PARATHYROID ADENOMA REMOVAL    . TEE WITHOUT CARDIOVERSION N/A 01/31/2018   Procedure: TRANSESOPHAGEAL ECHOCARDIOGRAM (TEE);  Surgeon: Yvonne Kendall, MD;  Location: [facility] ORS;  Service: Cardiovascular;  Laterality: N/A;  . TEE WITHOUT CARDIOVERSION N/A 02/01/2018   Procedure: TRANSESOPHAGEAL ECHOCARDIOGRAM (TEE);  Surgeon: Antonieta Iba, MD;  Location: [facility] ORS;  Service: Cardiovascular;  Laterality: N/A;    No Known Allergies  Outpatient Encounter  Medications as of 12/07/2018  Medication Sig  . acetaminophen (TYLENOL) 325 MG tablet Take 650 mg by mouth every 4 (four) hours as needed for mild pain or moderate pain.   Marland Kitchen. aspirin 325 MG tablet Take 1 tablet (325 mg total) by mouth daily.  Marland Kitchen. atorvastatin (LIPITOR) 80 MG tablet Take 1 tablet (80 mg total) by mouth daily at 6 PM.  . citalopram (CELEXA) 20 MG tablet Take 20 mg by mouth daily.   . cloNIDine (CATAPRES - DOSED IN MG/24 HR) 0.3 mg/24hr patch Place 1 patch  onto the skin every Friday.   . clopidogrel (PLAVIX) 75 MG tablet Take 1 tablet (75 mg total) by mouth daily.  . cyanocobalamin 1000 MCG tablet Take 1,000 mcg by mouth daily.   . divalproex (DEPAKOTE) 250 MG DR tablet Take 250 mg by mouth 3 (three) times daily.  Marland Kitchen. latanoprost (XALATAN) 0.005 % ophthalmic solution Place 1 drop into both eyes at bedtime.   Marland Kitchen. levothyroxine (SYNTHROID, LEVOTHROID) 88 MCG tablet Take 1 tablet by mouth daily.   Marland Kitchen. lisinopril-hydrochlorothiazide (PRINZIDE,ZESTORETIC) 20-25 MG tablet Take 1 tablet by mouth daily.   . mirtazapine (REMERON) 30 MG tablet Take 30 mg by mouth at bedtime.  . Nutritional Supplements (NUTRITIONAL SUPPLEMENT PO) Diet Type: Heart healthy, NAS  . polyethylene glycol (MIRALAX / GLYCOLAX) packet Take 17 g by mouth daily.  . potassium chloride SA (K-DUR,KLOR-CON) 20 MEQ tablet Take 20 mEq by mouth daily as needed. Take with Lasix for edema  . tamsulosin (FLOMAX) 0.4 MG CAPS capsule Take 1 capsule (0.4 mg total) by mouth daily.   No facility-administered encounter medications on file as of 12/07/2018.     Review of Systems  Unable to obtain due to vascular dementia    Immunization History  Administered Date(s) Administered  . Influenza Split 07/18/2014, 07/23/2015  . Influenza, High Dose Seasonal PF 06/14/2017  . Influenza-Unspecified 05/25/2012, 05/31/2016, 06/01/2018  . Pneumococcal Polysaccharide-23 08/30/2004, 08/04/2017   Pertinent  Health Maintenance Due  Topic Date Due  . PNA vac Low Risk Adult (2 of 2 - PCV13) 08/04/2018  . INFLUENZA VACCINE  03/31/2019  . DEXA SCAN  Discontinued   No flowsheet data found.   Vitals:   12/07/18 0944  BP: 129/61  Pulse: 68  Resp: 16  Temp: 98.4 F (36.9 C)  TempSrc: Oral  SpO2: 96%  Weight: 195 lb (88.5 kg)  Height: 5\' 1"  (1.549 m)   Body mass index is 36.84 kg/m.  Physical Exam  GENERAL APPEARANCE: Well nourished. In no acute distress. Obese SKIN:  Skin is warm and dry.  EYES:  Left eye ptosis MOUTH and THROAT: Lips are without lesions. Oral mucosa is moist and without lesions.  RESPIRATORY: Breathing is even & unlabored, BS CTAB CARDIAC: RRR, no murmur,no extra heart sounds, Right foot  2+ edema, Left foot 1+edema GI: Abdomen soft, normal BS, no masses, no tenderness EXTREMITIES: Able to move X 4 extremities NEUROLOGICAL: There is no tremor. Speech is clear. Alert to self, disoriented to time and place. PSYCHIATRIC:  Affect and behavior are appropriate  Labs reviewed: Recent Labs    05/12/18 0625 06/09/18 0620 10/04/18 0600  NA 142 143 138  K 3.5 4.0 3.9  CL 104 102 103  CO2 30 31 31   GLUCOSE 93 90 90  BUN 25* 22 19  CREATININE 1.01* 0.98 0.88  CALCIUM 8.8* 8.6* 8.5*   Recent Labs    05/12/18 0625 06/09/18 0620 10/04/18 0600  AST 16 15 18  ALT 10 10 16   ALKPHOS 69 61 62  BILITOT 0.6 0.3 0.4  PROT 5.9* 5.6* 5.7*  ALBUMIN 3.3* 2.6* 3.0*   Recent Labs    01/26/18 1510  05/12/18 0625 06/09/18 0620 10/04/18 0600  WBC 6.1   < > 5.1 5.5 6.2  NEUTROABS 4.4  --   --  3.0 3.2  HGB 14.4   < > 11.8* 10.8* 11.3*  HCT 42.4   < > 35.4 34.0* 35.4*  MCV 95.1   < > 92.3 92.6 94.4  PLT 114*   < > 117* 184 143*   < > = values in this interval not displayed.   Lab Results  Component Value Date   TSH 3.168 08/07/2018   Lab Results  Component Value Date   HGBA1C 5.6 01/27/2018   Lab Results  Component Value Date   CHOL 149 01/28/2018   HDL 40 (L) 01/28/2018   LDLCALC 81 01/28/2018   TRIG 139 01/28/2018   CHOLHDL 3.7 01/28/2018     Assessment/Plan  1. History of CVA (cerebrovascular accident) -Stable, continue aspirin EC 325 mg 1 tab daily, atorvastatin 80 mg 1 tab daily, clonidine patch 0.3 mg / 24-hour once a day on Fridays, lisinopril-hydrochlorothiazide 20-25 mg 1 tab daily, Plavix 75 mg 1 tab daily  2. Hypothyroidism due to acquired atrophy of thyroid Lab Results  Component Value Date   TSH 3.168 08/07/2018  -Continue levothyroxine  88 mcg 1 tab daily   3. Urinary retention -Stable, continue tamsulosin 0.4 mg 1 capsule daily  4. Essential hypertension Stable, continue clonidine 0.3 mg/24-hour transdermal patch daily on Fridays, lisinopril-hydrochlorothiazide 20-25 mg 1 tab daily  5. Depression, major, single episode, complete remission (HCC) -Stable, continue Celexa 20 mg 1 tab daily and mirtazapine 30 mg 1 tab at bedtime  6. Mood disorder (HCC) -Mood is stable, continue Depakote DR 250 mg 1 tab 3 times a day  7. Vascular dementia with behavior disturbance (HCC) -Stable, continue supportive care and fall precautions   Family/ staff Communication: Discussed plan of care with resident and charge nurse.  Labs/tests ordered:  None  Goals of care:   Long-term care   Kenard Gower, NP [facility] [phone] (Monday-Friday 8:00 a.m. - 5:00 p.m.) [phone] (after hours)

## 2018-12-29 ENCOUNTER — Encounter
Admission: RE | Admit: 2018-12-29 | Discharge: 2018-12-29 | Disposition: A | Discharge status: Home / Self Care | Payer: Medicare Other | Source: Ambulatory Visit | Attending: Internal Medicine | Admitting: Internal Medicine

## 2018-12-29 ENCOUNTER — Other Ambulatory Visit
Admission: RE | Admit: 2018-12-29 | Discharge: 2018-12-29 | Disposition: A | Discharge status: Home / Self Care | Payer: Medicare Other | Source: Ambulatory Visit | Attending: Adult Health | Admitting: Adult Health

## 2018-12-29 DIAGNOSIS — N189 Chronic kidney disease, unspecified: Secondary | ICD-10-CM | POA: Insufficient documentation

## 2018-12-29 DIAGNOSIS — I130 Hypertensive heart and chronic kidney disease with heart failure and stage 1 through stage 4 chronic kidney disease, or unspecified chronic kidney disease: Secondary | ICD-10-CM | POA: Insufficient documentation

## 2018-12-29 DIAGNOSIS — F325 Major depressive disorder, single episode, in full remission: Secondary | ICD-10-CM | POA: Diagnosis present

## 2018-12-29 LAB — CBC WITH DIFFERENTIAL/PLATELET
Abs Immature Granulocytes: 0.05 10*3/uL (ref 0.00–0.07)
Basophils Absolute: 0 10*3/uL (ref 0.0–0.1)
Basophils Relative: 1 %
Eosinophils Absolute: 0.2 10*3/uL (ref 0.0–0.5)
Eosinophils Relative: 3 %
HCT: 35.7 % — ABNORMAL LOW (ref 36.0–46.0)
Hemoglobin: 11.5 g/dL — ABNORMAL LOW (ref 12.0–15.0)
Immature Granulocytes: 1 %
Lymphocytes Relative: 22 %
Lymphs Abs: 1.6 10*3/uL (ref 0.7–4.0)
MCH: 30.3 pg (ref 26.0–34.0)
MCHC: 32.2 g/dL (ref 30.0–36.0)
MCV: 94.2 fL (ref 80.0–100.0)
Monocytes Absolute: 0.9 10*3/uL (ref 0.1–1.0)
Monocytes Relative: 12 %
Neutro Abs: 4.3 10*3/uL (ref 1.7–7.7)
Neutrophils Relative %: 61 %
Platelets: 163 10*3/uL (ref 150–400)
RBC: 3.79 MIL/uL — ABNORMAL LOW (ref 3.87–5.11)
RDW: 14 % (ref 11.5–15.5)
WBC: 7.1 10*3/uL (ref 4.0–10.5)
nRBC: 0 % (ref 0.0–0.2)

## 2018-12-29 LAB — COMPREHENSIVE METABOLIC PANEL
ALT: 27 U/L (ref 0–44)
AST: 28 U/L (ref 15–41)
Albumin: 3 g/dL — ABNORMAL LOW (ref 3.5–5.0)
Alkaline Phosphatase: 79 U/L (ref 38–126)
Anion gap: 10 (ref 5–15)
BUN: 24 mg/dL — ABNORMAL HIGH (ref 8–23)
CO2: 30 mmol/L (ref 22–32)
Calcium: 8.9 mg/dL (ref 8.9–10.3)
Chloride: 98 mmol/L (ref 98–111)
Creatinine, Ser: 0.89 mg/dL (ref 0.44–1.00)
GFR calc Af Amer: >60 mL/min (ref >60)
GFR calc non Af Amer: >60 mL/min (ref >60)
Glucose, Bld: 95 mg/dL (ref 70–99)
Potassium: 3.7 mmol/L (ref 3.5–5.1)
Sodium: 138 mmol/L (ref 135–145)
Total Bilirubin: 0.6 mg/dL (ref 0.3–1.2)
Total Protein: 6.3 g/dL — ABNORMAL LOW (ref 6.5–8.1)

## 2018-12-29 LAB — TSH: TSH: 5.838 u[IU]/mL — ABNORMAL HIGH (ref 0.350–4.500)

## 2018-12-29 LAB — VITAMIN B12: Vitamin B-12: 1242 pg/mL — ABNORMAL HIGH (ref 180–914)

## 2019-01-01 ENCOUNTER — Encounter: Payer: Self-pay | Admitting: Adult Health

## 2019-01-01 ENCOUNTER — Non-Acute Institutional Stay (SKILLED_NURSING_FACILITY): Payer: Medicare Other | Admitting: Adult Health

## 2019-01-01 DIAGNOSIS — F39 Unspecified mood [affective] disorder: Secondary | ICD-10-CM

## 2019-01-01 DIAGNOSIS — Z8673 Personal history of transient ischemic attack (TIA), and cerebral infarction without residual deficits: Secondary | ICD-10-CM | POA: Diagnosis not present

## 2019-01-01 DIAGNOSIS — F325 Major depressive disorder, single episode, in full remission: Secondary | ICD-10-CM

## 2019-01-01 DIAGNOSIS — E538 Deficiency of other specified B group vitamins: Secondary | ICD-10-CM | POA: Diagnosis not present

## 2019-01-01 DIAGNOSIS — I5032 Chronic diastolic (congestive) heart failure: Secondary | ICD-10-CM

## 2019-01-01 DIAGNOSIS — E034 Atrophy of thyroid (acquired): Secondary | ICD-10-CM | POA: Diagnosis not present

## 2019-01-01 DIAGNOSIS — F01518 Vascular dementia, unspecified severity, with other behavioral disturbance: Secondary | ICD-10-CM

## 2019-01-01 DIAGNOSIS — F0151 Vascular dementia with behavioral disturbance: Secondary | ICD-10-CM

## 2019-01-01 DIAGNOSIS — R339 Retention of urine, unspecified: Secondary | ICD-10-CM | POA: Diagnosis not present

## 2019-01-01 DIAGNOSIS — I110 Hypertensive heart disease with heart failure: Secondary | ICD-10-CM

## 2019-01-01 NOTE — Progress Notes (Signed)
Location:  [facility] Room Number: 311-A Place of Service:  SNF ([phone]) Provider:  Kenard Gower, DNP  Patient Care Team: Lauro Regulus, MD as PCP - General (Internal Medicine) Mariah Milling Tollie Pizza, MD as Consulting Physician (Cardiology) Sharee Holster, NP as Nurse Practitioner (Geriatric Medicine)  Extended Emergency Contact Information Primary Emergency Contact: Freda Munro Address: [address]          [place], [place] [address] [place] of [place] Home Phone: [phone] Mobile Phone: [phone] Relation: Spouse Secondary Emergency Contact: Burman [place] of [place] Home Phone: [phone] Mobile Phone: [phone] Relation: Daughter  Code Status:  Full Code  Goals of care: Advanced Directive information Advanced Directives 12/07/2018  Does Patient Have a Medical Advance Directive? No  Type of Advance Directive -  Does patient want to make changes to medical advance directive? No - Patient declined  Copy of Healthcare Power of Attorney in Chart? -  Would patient like information on creating a medical advance directive? -     Chief Complaint  Patient presents with  . Medical Management of Chronic Issues    Routine [facility] visit    HPI:  Pt is an 83 y.o. female seen today for medical management of chronic diseases.  She is a long-term care resident of [facility]	[place]. She has a PMH of cerebral infarction, HLD, hypothyroidism, and HTN. She was seen today. Latest tsh 5.838, elevated. Vitamin B12 level 1,242, elevated. She was friendly and happy during the visit.   Past Medical History:  Diagnosis Date  . Cerebral infarction (HCC)   . CKD (chronic kidney disease), stage III (HCC)   . Depression   . DJD (degenerative joint disease)   . Edema   . High cholesterol   . Hyperlipidemia, unspecified   . Hypertension   . Hypothyroidism   . Malaise   . Morbid obesity (HCC)   .  Osteoarthritis   . Parathyroid abnormality (HCC)   . Seizures (HCC)    a. following remote stroke.  . Stroke (HCC) 2002, 05/2017, 01/26/18, 01/30/18   a. 1964-->residual right arm wkns.  Previously on coumadin - pt says for just a few yrs.  . Subdural hematoma (HCC)    a. 10/2001 [facility] req temporal frontoparietal craniotomy following fall. ? whether or not pt on coumadin @ time.  Notes indicate yes but pt denies.  . Thyroid disease   . TMJ (dislocation of temporomandibular joint)   . Tuberculosis    a. ~ 1950  . Urinary incontinence   . Weight loss    Past Surgical History:  Procedure Laterality Date  . ABDOMINAL HYSTERECTOMY    . BACK SURGERY    . BRAIN SURGERY    . cataract surgery     2014  . LEFT HEART CATH AND CORONARY ANGIOGRAPHY N/A 06/13/2017   Procedure: LEFT HEART CATH AND CORONARY ANGIOGRAPHY;  Surgeon: Iran Ouch, MD;  Location: [facility] INVASIVE CV LAB;  Service: Cardiovascular;  Laterality: N/A;  . PARATHYROID ADENOMA REMOVAL    . TEE WITHOUT CARDIOVERSION N/A 01/31/2018   Procedure: TRANSESOPHAGEAL ECHOCARDIOGRAM (TEE);  Surgeon: Yvonne Kendall, MD;  Location: [facility] ORS;  Service: Cardiovascular;  Laterality: N/A;  . TEE WITHOUT CARDIOVERSION N/A 02/01/2018   Procedure: TRANSESOPHAGEAL ECHOCARDIOGRAM (TEE);  Surgeon: Antonieta Iba, MD;  Location: [facility] ORS;  Service: Cardiovascular;  Laterality: N/A;    No Known Allergies  Outpatient Encounter Medications as of 01/01/2019  Medication Sig  .  acetaminophen (TYLENOL) 325 MG tablet Take 650 mg by mouth every 4 (four) hours as needed for mild pain or moderate pain.   Marland Kitchen aspirin 325 MG tablet Take 1 tablet (325 mg total) by mouth daily.  Marland Kitchen atorvastatin (LIPITOR) 80 MG tablet Take 1 tablet (80 mg total) by mouth daily at 6 PM.  . citalopram (CELEXA) 20 MG tablet Take 20 mg by mouth daily.   . cloNIDine (CATAPRES - DOSED IN MG/24 HR) 0.3 mg/24hr patch Place 1 patch onto the skin every Friday.   . clopidogrel (PLAVIX) 75 MG  tablet Take 1 tablet (75 mg total) by mouth daily.  . cyanocobalamin 1000 MCG tablet Take 1,000 mcg by mouth daily.   . divalproex (DEPAKOTE) 250 MG DR tablet Take 250 mg by mouth 3 (three) times daily.  Marland Kitchen latanoprost (XALATAN) 0.005 % ophthalmic solution Place 1 drop into both eyes at bedtime.   Marland Kitchen levothyroxine (SYNTHROID, LEVOTHROID) 88 MCG tablet Take 1 tablet by mouth daily.   Marland Kitchen lisinopril-hydrochlorothiazide (PRINZIDE,ZESTORETIC) 20-25 MG tablet Take 1 tablet by mouth daily.   . Menthol, Topical Analgesic, (BIOFREEZE) 4 % GEL Apply 1 application topically 3 (three) times daily.  . mirtazapine (REMERON) 30 MG tablet Take 30 mg by mouth at bedtime.  . Nutritional Supplements (NUTRITIONAL SUPPLEMENT PO) Diet Type: Heart healthy, NAS  . polyethylene glycol (MIRALAX / GLYCOLAX) packet Take 17 g by mouth daily.   . potassium chloride SA (K-DUR,KLOR-CON) 20 MEQ tablet Take 20 mEq by mouth daily as needed. Take with Lasix for edema  . tamsulosin (FLOMAX) 0.4 MG CAPS capsule Take 1 capsule (0.4 mg total) by mouth daily.   No facility-administered encounter medications on file as of 01/01/2019.     Review of Systems  Unable to obtain due to vascular dementia    Immunization History  Administered Date(s) Administered  . Influenza Split 07/18/2014, 07/23/2015  . Influenza, High Dose Seasonal PF 06/14/2017  . Influenza-Unspecified 05/25/2012, 05/31/2016, 06/01/2018  . Pneumococcal Polysaccharide-23 08/30/2004, 08/04/2017   Pertinent  Health Maintenance Due  Topic Date Due  . PNA vac Low Risk Adult (2 of 2 - PCV13) 08/04/2018  . INFLUENZA VACCINE  03/31/2019  . DEXA SCAN  Discontinued    Vitals:   01/01/19 1528  BP: 137/70  Pulse: 84  Resp: 20  Temp: 98 F (36.7 C)  TempSrc: Oral  SpO2: 92%  Weight: 198 lb 9.6 oz (90.1 kg)  Height: 5\' 1"  (1.549 m)   Body mass index is 37.53 kg/m.  Physical Exam  GENERAL APPEARANCE: Well nourished. In no acute distress. Obese SKIN:  Skin is  warm and dry.  MOUTH and THROAT: Lips are without lesions. Oral mucosa is moist and without lesions.  RESPIRATORY: Breathing is even & unlabored, BS CTAB CARDIAC: RRR, no murmur,no extra heart sounds, no edema GI: Abdomen soft, normal BS, no masses, no tenderness NEUROLOGICAL: There is no tremor. Speech is clear. Alert to self, disoriented to time and place. PSYCHIATRIC:  Affect and behavior are appropriate  Labs reviewed: Recent Labs    06/09/18 0620 10/04/18 0600 12/29/18 0610  NA 143 138 138  K 4.0 3.9 3.7  CL 102 103 98  CO2 31 31 30   GLUCOSE 90 90 95  BUN 22 19 24*  CREATININE 0.98 0.88 0.89  CALCIUM 8.6* 8.5* 8.9   Recent Labs    06/09/18 0620 10/04/18 0600 12/29/18 0610  AST 15 18 28   ALT 10 16 27   ALKPHOS 61 62 79  BILITOT  0.3 0.4 0.6  PROT 5.6* 5.7* 6.3*  ALBUMIN 2.6* 3.0* 3.0*   Recent Labs    06/09/18 0620 10/04/18 0600 12/29/18 0610  WBC 5.5 6.2 7.1  NEUTROABS 3.0 3.2 4.3  HGB 10.8* 11.3* 11.5*  HCT 34.0* 35.4* 35.7*  MCV 92.6 94.4 94.2  PLT 184 143* 163   Lab Results  Component Value Date   TSH 5.838 (H) 12/29/2018   Lab Results  Component Value Date   HGBA1C 5.6 01/27/2018   Lab Results  Component Value Date   CHOL 149 01/28/2018   HDL 40 (L) 01/28/2018   LDLCALC 81 01/28/2018   TRIG 139 01/28/2018   CHOLHDL 3.7 01/28/2018    Assessment/Plan  1. Hypothyroidism due to acquired atrophy of thyroid Lab Results  Component Value Date   TSH 5.838 (H) 12/29/2018  - latest tsh is elevated, will increase levothyroxine from 88 mcg to 100 mcg 1 tab daily, repeat TSH in 6 weeks   2. Vitamin B12 deficiency Lab Results  Component Value Date   VITAMINB12 1,242 (H) 12/29/2018  -Will decrease vitamin B12 from 1000 mcg daily to 500 mcg daily  3. Chronic retention of urine -Continue tamsulosin 0.4 mg 1 capsule daily  4. History of CVA (cerebrovascular accident) -Stable, continue Plavix 75 mg 1 tab daily, clonidine 0.3 mg / 24-hour 1 patch  q. Fridays, lisinopril-HCTZ 20-25 mg 1 tab daily, atorvastatin 80 mg 1 tab daily and aspirin 325 mg 1 tab daily  5. Hypertensive heart disease with chronic diastolic congestive heart failure (HCC) -BP is well controlled, no SOB, will continue lisinopril-HCTZ 20-25 mg 1 tab daily, clonidine 0.3 mg/24-hour transdermal q. Fridays  6. Mood disorder (HCC) -Mood is a stable, continue Depakote DR 250 mg 1 tab 3 times daily  7. Depression, major, single episode, complete remission (HCC) -Denies feeling depressed, continue Celexa 20 mg 1 tab daily, mirtazapine 30 mg 1 tab at bedtime, daughter reported that resident is on this regimen for a long time and altering dosage may cause for behavior deterioration  8. Vascular dementia with behavior disturbance (HCC) -Continue supportive care, fall precautions   Family/ staff Communication: Discussed plan of care with resident.  Labs/tests ordered: TSH in 6 weeks  Goals of care:   Long-term care.   Kenard GowerMonina Medina-Vargas, DNP [facility] [phone] (Monday-Friday 8:00 a.m. - 5:00 p.m.) [phone] (after hours)

## 2019-01-25 ENCOUNTER — Other Ambulatory Visit
Admission: RE | Admit: 2019-01-25 | Discharge: 2019-01-25 | Disposition: A | Discharge status: Home / Self Care | Payer: Medicare Other | Source: Skilled Nursing Facility | Attending: Internal Medicine | Admitting: Internal Medicine

## 2019-01-25 DIAGNOSIS — R8290 Unspecified abnormal findings in urine: Secondary | ICD-10-CM | POA: Insufficient documentation

## 2019-01-25 DIAGNOSIS — R410 Disorientation, unspecified: Secondary | ICD-10-CM | POA: Insufficient documentation

## 2019-01-25 LAB — URINALYSIS, COMPLETE (UACMP) WITH MICROSCOPIC
Bacteria, UA: NONE SEEN
Bilirubin Urine: NEGATIVE
Glucose, UA: NEGATIVE mg/dL
Hgb urine dipstick: NEGATIVE
Ketones, ur: NEGATIVE mg/dL
Nitrite: POSITIVE — AB
Protein, ur: NEGATIVE mg/dL
Specific Gravity, Urine: 1.016 (ref 1.005–1.030)
Squamous Epithelial / LPF: NONE SEEN (ref 0–5)
pH: 6 (ref 5.0–8.0)

## 2019-01-27 LAB — URINE CULTURE: Culture: 70000 — AB

## 2019-01-28 ENCOUNTER — Encounter: Payer: Self-pay | Admitting: Radiology

## 2019-01-28 ENCOUNTER — Other Ambulatory Visit: Payer: Self-pay

## 2019-01-28 ENCOUNTER — Emergency Department: Payer: Medicare Other

## 2019-01-28 ENCOUNTER — Inpatient Hospital Stay
Admission: EM | Admit: 2019-01-28 | Discharge: 2019-01-30 | DRG: 378 | Disposition: A | Discharge status: Skilled Nursing Facility | Payer: Medicare Other | Source: Skilled Nursing Facility | Attending: Internal Medicine | Admitting: Internal Medicine

## 2019-01-28 DIAGNOSIS — G40909 Epilepsy, unspecified, not intractable, without status epilepticus: Secondary | ICD-10-CM | POA: Diagnosis present

## 2019-01-28 DIAGNOSIS — I130 Hypertensive heart and chronic kidney disease with heart failure and stage 1 through stage 4 chronic kidney disease, or unspecified chronic kidney disease: Secondary | ICD-10-CM | POA: Diagnosis present

## 2019-01-28 DIAGNOSIS — K921 Melena: Secondary | ICD-10-CM | POA: Diagnosis present

## 2019-01-28 DIAGNOSIS — K5791 Diverticulosis of intestine, part unspecified, without perforation or abscess with bleeding: Principal | ICD-10-CM | POA: Diagnosis present

## 2019-01-28 DIAGNOSIS — F418 Other specified anxiety disorders: Secondary | ICD-10-CM | POA: Diagnosis present

## 2019-01-28 DIAGNOSIS — K922 Gastrointestinal hemorrhage, unspecified: Secondary | ICD-10-CM | POA: Diagnosis not present

## 2019-01-28 DIAGNOSIS — Z90710 Acquired absence of both cervix and uterus: Secondary | ICD-10-CM | POA: Diagnosis not present

## 2019-01-28 DIAGNOSIS — E034 Atrophy of thyroid (acquired): Secondary | ICD-10-CM | POA: Diagnosis present

## 2019-01-28 DIAGNOSIS — Z8673 Personal history of transient ischemic attack (TIA), and cerebral infarction without residual deficits: Secondary | ICD-10-CM | POA: Diagnosis not present

## 2019-01-28 DIAGNOSIS — Z823 Family history of stroke: Secondary | ICD-10-CM

## 2019-01-28 DIAGNOSIS — E785 Hyperlipidemia, unspecified: Secondary | ICD-10-CM | POA: Diagnosis present

## 2019-01-28 DIAGNOSIS — N179 Acute kidney failure, unspecified: Secondary | ICD-10-CM | POA: Diagnosis present

## 2019-01-28 DIAGNOSIS — Z6841 Body Mass Index (BMI) 40.0 and over, adult: Secondary | ICD-10-CM

## 2019-01-28 DIAGNOSIS — Z7902 Long term (current) use of antithrombotics/antiplatelets: Secondary | ICD-10-CM

## 2019-01-28 DIAGNOSIS — Z79890 Hormone replacement therapy (postmenopausal): Secondary | ICD-10-CM

## 2019-01-28 DIAGNOSIS — N183 Chronic kidney disease, stage 3 (moderate): Secondary | ICD-10-CM | POA: Diagnosis present

## 2019-01-28 DIAGNOSIS — Z1159 Encounter for screening for other viral diseases: Secondary | ICD-10-CM | POA: Diagnosis not present

## 2019-01-28 DIAGNOSIS — Z7982 Long term (current) use of aspirin: Secondary | ICD-10-CM

## 2019-01-28 DIAGNOSIS — I5030 Unspecified diastolic (congestive) heart failure: Secondary | ICD-10-CM | POA: Diagnosis present

## 2019-01-28 DIAGNOSIS — Z79899 Other long term (current) drug therapy: Secondary | ICD-10-CM

## 2019-01-28 DIAGNOSIS — Z8249 Family history of ischemic heart disease and other diseases of the circulatory system: Secondary | ICD-10-CM | POA: Diagnosis not present

## 2019-01-28 DIAGNOSIS — M1990 Unspecified osteoarthritis, unspecified site: Secondary | ICD-10-CM | POA: Diagnosis present

## 2019-01-28 DIAGNOSIS — N390 Urinary tract infection, site not specified: Secondary | ICD-10-CM | POA: Diagnosis present

## 2019-01-28 DIAGNOSIS — E7800 Pure hypercholesterolemia, unspecified: Secondary | ICD-10-CM | POA: Diagnosis present

## 2019-01-28 DIAGNOSIS — E860 Dehydration: Secondary | ICD-10-CM | POA: Diagnosis present

## 2019-01-28 DIAGNOSIS — I10 Essential (primary) hypertension: Secondary | ICD-10-CM | POA: Diagnosis present

## 2019-01-28 LAB — PROTIME-INR
INR: 0.9 (ref 0.8–1.2)
Prothrombin Time: 12.5 seconds (ref 11.4–15.2)

## 2019-01-28 LAB — COMPREHENSIVE METABOLIC PANEL
ALT: 24 U/L (ref 0–44)
AST: 24 U/L (ref 15–41)
Albumin: 3.1 g/dL — ABNORMAL LOW (ref 3.5–5.0)
Alkaline Phosphatase: 80 U/L (ref 38–126)
Anion gap: 11 (ref 5–15)
BUN: 26 mg/dL — ABNORMAL HIGH (ref 8–23)
CO2: 27 mmol/L (ref 22–32)
Calcium: 8.7 mg/dL — ABNORMAL LOW (ref 8.9–10.3)
Chloride: 99 mmol/L (ref 98–111)
Creatinine, Ser: 1.3 mg/dL — ABNORMAL HIGH (ref 0.44–1.00)
GFR calc Af Amer: 45 mL/min — ABNORMAL LOW (ref >60)
GFR calc non Af Amer: 39 mL/min — ABNORMAL LOW (ref >60)
Glucose, Bld: 137 mg/dL — ABNORMAL HIGH (ref 70–99)
Potassium: 3.7 mmol/L (ref 3.5–5.1)
Sodium: 137 mmol/L (ref 135–145)
Total Bilirubin: 0.6 mg/dL (ref 0.3–1.2)
Total Protein: 6.4 g/dL — ABNORMAL LOW (ref 6.5–8.1)

## 2019-01-28 LAB — CBC
HCT: 39.7 % (ref 36.0–46.0)
Hemoglobin: 13 g/dL (ref 12.0–15.0)
MCH: 30.2 pg (ref 26.0–34.0)
MCHC: 32.7 g/dL (ref 30.0–36.0)
MCV: 92.1 fL (ref 80.0–100.0)
Platelets: 165 10*3/uL (ref 150–400)
RBC: 4.31 MIL/uL (ref 3.87–5.11)
RDW: 14.5 % (ref 11.5–15.5)
WBC: 8.9 10*3/uL (ref 4.0–10.5)
nRBC: 0 % (ref 0.0–0.2)

## 2019-01-28 MED ORDER — IOHEXOL 240 MG/ML SOLN: 50.0000 mL | Freq: Once | INTRAMUSCULAR | Status: DC | PRN

## 2019-01-28 MED ORDER — IOHEXOL 240 MG/ML SOLN
50.0000 mL | Freq: Once | INTRAMUSCULAR | Status: AC | PRN
  Administered 2019-01-28: 50 mL via ORAL

## 2019-01-28 MED ORDER — IOHEXOL 300 MG/ML  SOLN
100.0000 mL | Freq: Once | INTRAMUSCULAR | Status: AC | PRN
  Administered 2019-01-28: 23:00:00 100 mL via INTRAVENOUS

## 2019-01-28 NOTE — ED Notes (Signed)
Patient has returned from CT.  Patient resting comfortable at this time.  Explained would call her daughter to give an update after CT results back, patient verbalized understanding.  Ask patient if she would like to speak to daughter at that time, patient responded, "no thank you".

## 2019-01-28 NOTE — ED Provider Notes (Signed)
[facility] Emergency Department Provider Note  Time seen: 8:55 PM  I have reviewed the triage vital signs and the nursing notes.   HISTORY  Chief Complaint Abdominal Pain and Rectal Bleeding  HPI Shawna [name] is a 83 y.o. female with a past medical history of CKD, CVA, hyperlipidemia, hypertension, seizure disorder, presents to the emergency department for abdominal pain and bloody stool.   According to the patient and EMS report patient presents from a nursing home, was recently diagnosed with urinary tract infection and started on antibiotic today.  Patient states today she began experiencing central abdominal pain, moderate in intensity sharp and intermittent.  Patient later had a bowel movement that appeared red so they sent the patient to the emergency department.  Upon arrival patient denies any abdominal pain but states the pain comes and goes and is intermittent.  Denies any history of GI bleeding in the past.  Denies any nausea or vomiting.  No chest pain cough congestion or shortness of breath.  Negative for fever.  Past Medical History:  Diagnosis Date  . Cerebral infarction (HCC)   . CKD (chronic kidney disease), stage III (HCC)   . Depression   . DJD (degenerative joint disease)   . Edema   . High cholesterol   . Hyperlipidemia, unspecified   . Hypertension   . Hypothyroidism   . Malaise   . Morbid obesity (HCC)   . Osteoarthritis   . Parathyroid abnormality (HCC)   . Seizures (HCC)    a. following remote stroke.  . Stroke (HCC) 2002, 05/2017, 01/26/18, 01/30/18   a. 1964-->residual right arm wkns.  Previously on coumadin - pt says for just a few yrs.  . Subdural hematoma (HCC)    a. 10/2001 [facility] req temporal frontoparietal craniotomy following fall. ? whether or not pt on coumadin @ time.  Notes indicate yes but pt denies.  . Thyroid disease   . TMJ (dislocation of temporomandibular joint)   . Tuberculosis    a. ~ 1950  . Urinary  incontinence   . Weight loss     Patient Active Problem List   Diagnosis Date Noted  . Chronic retention of urine 08/06/2018  . Hypertensive heart disease with congestive heart failure (HCC) 03/06/2018  . Chronic kidney disease (CKD), stage III (moderate) (HCC) 03/06/2018  . Dyslipidemia 03/06/2018  . Hypothyroidism due to acquired atrophy of thyroid 03/06/2018  . Glaucoma (increased eye pressure) 03/06/2018  . Depression with anxiety 03/06/2018  . Chronic cerebrovascular accident 01/26/2018  . Diastolic congestive heart failure (HCC) 07/28/2017    Past Surgical History:  Procedure Laterality Date  . ABDOMINAL HYSTERECTOMY    . BACK SURGERY    . BRAIN SURGERY    . cataract surgery     2014  . LEFT HEART CATH AND CORONARY ANGIOGRAPHY N/A 06/13/2017   Procedure: LEFT HEART CATH AND CORONARY ANGIOGRAPHY;  Surgeon: Iran Ouch, MD;  Location: [facility] INVASIVE CV LAB;  Service: Cardiovascular;  Laterality: N/A;  . PARATHYROID ADENOMA REMOVAL    . TEE WITHOUT CARDIOVERSION N/A 01/31/2018   Procedure: TRANSESOPHAGEAL ECHOCARDIOGRAM (TEE);  Surgeon: Yvonne Kendall, MD;  Location: [facility] ORS;  Service: Cardiovascular;  Laterality: N/A;  . TEE WITHOUT CARDIOVERSION N/A 02/01/2018   Procedure: TRANSESOPHAGEAL ECHOCARDIOGRAM (TEE);  Surgeon: Antonieta Iba, MD;  Location: [facility] ORS;  Service: Cardiovascular;  Laterality: N/A;    Prior to Admission medications   Medication Sig Start Date End Date Taking? Authorizing Provider  acetaminophen (TYLENOL)  325 MG tablet Take 650 mg by mouth every 4 (four) hours as needed for mild pain or moderate pain.     [provider]  aspirin 325 MG tablet Take 1 tablet (325 mg total) by mouth daily. 02/02/18   Enid Baas, MD  atorvastatin (LIPITOR) 80 MG tablet Take 1 tablet (80 mg total) by mouth daily at 6 PM. 02/01/18   Enid Baas, MD  citalopram (CELEXA) 20 MG tablet Take 20 mg by mouth daily.  06/20/18   [provider]   cloNIDine (CATAPRES - DOSED IN MG/24 HR) 0.3 mg/24hr patch Place 1 patch onto the skin every Friday.  11/15/17   [provider]  clopidogrel (PLAVIX) 75 MG tablet Take 1 tablet (75 mg total) by mouth daily. 02/02/18   Enid Baas, MD  cyanocobalamin 1000 MCG tablet Take 1,000 mcg by mouth daily.  06/20/18   [provider]  divalproex (DEPAKOTE) 250 MG DR tablet Take 250 mg by mouth 3 (three) times daily.    [provider]  latanoprost (XALATAN) 0.005 % ophthalmic solution Place 1 drop into both eyes at bedtime.     [provider]  levothyroxine (SYNTHROID, LEVOTHROID) 88 MCG tablet Take 1 tablet by mouth daily.     [provider]  lisinopril-hydrochlorothiazide (PRINZIDE,ZESTORETIC) 20-25 MG tablet Take 1 tablet by mouth daily.  01/21/18   [provider]  Menthol, Topical Analgesic, (BIOFREEZE) 4 % GEL Apply 1 application topically 3 (three) times daily.    [provider]  mirtazapine (REMERON) 30 MG tablet Take 30 mg by mouth at bedtime.    [provider]  Nutritional Supplements (NUTRITIONAL SUPPLEMENT PO) Diet Type: Heart healthy, NAS    [provider]  polyethylene glycol (MIRALAX / GLYCOLAX) packet Take 17 g by mouth daily.  05/29/18   [provider]  potassium chloride SA (K-DUR,KLOR-CON) 20 MEQ tablet Take 20 mEq by mouth daily as needed. Take with Lasix for edema    [provider]  tamsulosin (FLOMAX) 0.4 MG CAPS capsule Take 1 capsule (0.4 mg total) by mouth daily. 02/01/18   Enid Baas, MD    No Known Allergies  Family History  Problem Relation Age of Onset  . Heart failure Mother        died @ 72  . Heart attack Father        died @ 77  . Stroke Father   . Breast cancer Neg Hx     Social History Social History   Tobacco Use  . Smoking status: Never Smoker  . Smokeless tobacco: Never Used  Substance Use Topics  . Alcohol use: No  . Drug use: No     Review of Systems Constitutional: Negative for fever. ENT: Negative for recent illness/congestion Cardiovascular: Negative for chest pain. Respiratory: Negative for shortness of breath. Gastrointestinal: Positive for intermittent central abdominal pain.  Negative for nausea vomiting.  Positive for bloody stool tonight. Genitourinary: Negative for urinary compaints Musculoskeletal: Negative for musculoskeletal complaints Skin: Negative for skin complaints  Neurological: Negative for headache All other ROS negative  ____________________________________________   PHYSICAL EXAM:  VITAL SIGNS: ED Triage Vitals [01/28/19 2047]  Enc Vitals Group     BP 104/68     Pulse Rate 79     Resp 16     Temp      Temp src      SpO2 100 %     Weight 240 lb 8.4 oz (109.1 kg)  Height 5\' 4"  (1.626 m)     Head Circumference      Peak Flow      Pain Score 0     Pain Loc      Pain Edu?      Excl. in GC?     Constitutional: Alert and oriented. Well appearing and in no distress. Eyes: Normal exam ENT      Head: Normocephalic and atraumatic      Mouth/Throat: Mucous membranes are moist. Cardiovascular: Normal rate, regular rhythm.  Respiratory: Normal respiratory effort without tachypnea nor retractions. Breath sounds are clear Gastrointestinal: Soft, obese, nontender. Musculoskeletal: Nontender with normal range of motion in all extremities. No lower extremity tenderness Neurologic:  Normal speech and language. No gross focal neurologic deficits  Skin:  Skin is warm, dry and intact.  Psychiatric: Mood and affect are normal.   ____________________________________________    RADIOLOGY  CT scan is negative for acute abnormality  ____________________________________________   INITIAL IMPRESSION / ASSESSMENT AND PLAN / ED COURSE  Pertinent labs & imaging results that were available during my care of the patient were reviewed by me and considered in my medical decision making  (see chart for details).   Patient presents to the emergency department for intermittent abdominal pain today found to have bloody stool.  Overall the patient appears well, no acute distress.  Denies any abdominal pain at this time.  Benign abdominal exam.  Patient's rectal exam does show light hematochezia strongly guaiac positive.  We will check labs and continue to closely monitor.  Given the intermittent abdominal pain we will proceed with CT imaging of the abdomen to further evaluate.  Patient agreeable to plan of care.  Patient CT scan is negative for acute abnormality.  However given the patient's hematochezia on exam we will admit to the hospital service for continued monitoring and work-up.  Ernst BreachJanet A Moosman was evaluated in Emergency Department on 01/28/2019 for the symptoms described in the history of present illness. She was evaluated in the context of the global COVID-19 pandemic, which necessitated consideration that the patient might be at risk for infection with the SARS-CoV-2 virus that causes COVID-19. Institutional protocols and algorithms that pertain to the evaluation of patients at risk for COVID-19 are in a state of rapid change based on information released by regulatory bodies including the CDC and federal and state organizations. These policies and algorithms were followed during the patient's care in the ED.  ____________________________________________   FINAL CLINICAL IMPRESSION(S) / ED DIAGNOSES  GI bleed Abdominal pain   Minna AntisPaduchowski, Lorin Gawron, MD 01/28/19 2331

## 2019-01-28 NOTE — ED Notes (Signed)
Spoke with patient's daughter, Avalynne Komm [phone]) and gave update on patient.  Explained now waiting for patient to have CT scan and would call and let her know about those results as soon as this RN can, she verbalized understanding.

## 2019-01-28 NOTE — ED Triage Notes (Signed)
Patient to Rm 5 via EMS from local nsg home.  Patient started antibiotic today for urinary tract infection.  After taking first dose began to experience abdominal pain and had loose stools that appeared to have blood in it.

## 2019-01-28 NOTE — H&P (Signed)
[facility] [place] [facility]   PATIENT NAME: Shawna [name]    MR#:  [id number]  DATE OF BIRTH:  April 05, 1936  DATE OF ADMISSION:  01/28/2019  PRIMARY CARE PHYSICIAN: Lauro Regulus, MD   REQUESTING/REFERRING PHYSICIAN: Lenard Lance, MD  CHIEF COMPLAINT:   Chief Complaint  Patient presents with  . Abdominal Pain  . Rectal Bleeding    HISTORY OF PRESENT ILLNESS:  Shawna [name]  is a 83 y.o. female who presents with chief complaint as above.  Patient presents the ED with a complaint of abdominal pain and rectal bleeding.  She states that this started today.  She states that she was recently diagnosed with a UTI and was just started on antibiotics today.  However, with the bleeding today, which she had a couple of episodes at home she came to the ED for evaluation.  Her rectal exam was grossly positive for blood per ED physician.  Hospitalist were called for admission  PAST MEDICAL HISTORY:   Past Medical History:  Diagnosis Date  . Cerebral infarction (HCC)   . CKD (chronic kidney disease), stage III (HCC)   . Depression   . DJD (degenerative joint disease)   . Edema   . High cholesterol   . Hyperlipidemia, unspecified   . Hypertension   . Hypothyroidism   . Malaise   . Morbid obesity (HCC)   . Osteoarthritis   . Parathyroid abnormality (HCC)   . Seizures (HCC)    a. following remote stroke.  . Stroke (HCC) 2002, 05/2017, 01/26/18, 01/30/18   a. 1964-->residual right arm wkns.  Previously on coumadin - pt says for just a few yrs.  . Subdural hematoma (HCC)    a. 10/2001 [facility] req temporal frontoparietal craniotomy following fall. ? whether or not pt on coumadin @ time.  Notes indicate yes but pt denies.  . Thyroid disease   . TMJ (dislocation of temporomandibular joint)   . Tuberculosis    a. ~ 1950  . Urinary incontinence   . Weight loss      PAST SURGICAL HISTORY:   Past Surgical History:  Procedure Laterality Date  . ABDOMINAL  HYSTERECTOMY    . BACK SURGERY    . BRAIN SURGERY    . cataract surgery     2014  . LEFT HEART CATH AND CORONARY ANGIOGRAPHY N/A 06/13/2017   Procedure: LEFT HEART CATH AND CORONARY ANGIOGRAPHY;  Surgeon: Iran Ouch, MD;  Location: [facility] INVASIVE CV LAB;  Service: Cardiovascular;  Laterality: N/A;  . PARATHYROID ADENOMA REMOVAL    . TEE WITHOUT CARDIOVERSION N/A 01/31/2018   Procedure: TRANSESOPHAGEAL ECHOCARDIOGRAM (TEE);  Surgeon: Yvonne Kendall, MD;  Location: [facility] ORS;  Service: Cardiovascular;  Laterality: N/A;  . TEE WITHOUT CARDIOVERSION N/A 02/01/2018   Procedure: TRANSESOPHAGEAL ECHOCARDIOGRAM (TEE);  Surgeon: Antonieta Iba, MD;  Location: [facility] ORS;  Service: Cardiovascular;  Laterality: N/A;     SOCIAL HISTORY:   Social History   Tobacco Use  . Smoking status: Never Smoker  . Smokeless tobacco: Never Used  Substance Use Topics  . Alcohol use: No     FAMILY HISTORY:   Family History  Problem Relation Age of Onset  . Heart failure Mother        died @ 30  . Heart attack Father        died @ 38  . Stroke Father   . Breast cancer Neg Hx      DRUG ALLERGIES:  No Known Allergies  MEDICATIONS AT HOME:   Prior to Admission medications   Medication Sig Start Date End Date Taking? Authorizing Provider  acetaminophen (TYLENOL) 325 MG tablet Take 650 mg by mouth every 4 (four) hours as needed for mild pain or moderate pain.     [provider]  aspirin 325 MG tablet Take 1 tablet (325 mg total) by mouth daily. 02/02/18   Enid Baas, MD  atorvastatin (LIPITOR) 80 MG tablet Take 1 tablet (80 mg total) by mouth daily at 6 PM. 02/01/18   Enid Baas, MD  citalopram (CELEXA) 20 MG tablet Take 20 mg by mouth daily.  06/20/18   [provider]  cloNIDine (CATAPRES - DOSED IN MG/24 HR) 0.3 mg/24hr patch Place 1 patch onto the skin every Friday.  11/15/17   [provider]  clopidogrel (PLAVIX) 75 MG tablet Take 1 tablet (75 mg  total) by mouth daily. 02/02/18   Enid Baas, MD  cyanocobalamin 1000 MCG tablet Take 1,000 mcg by mouth daily.  06/20/18   [provider]  divalproex (DEPAKOTE) 250 MG DR tablet Take 250 mg by mouth 3 (three) times daily.    [provider]  latanoprost (XALATAN) 0.005 % ophthalmic solution Place 1 drop into both eyes at bedtime.     [provider]  levothyroxine (SYNTHROID, LEVOTHROID) 88 MCG tablet Take 1 tablet by mouth daily.     [provider]  lisinopril-hydrochlorothiazide (PRINZIDE,ZESTORETIC) 20-25 MG tablet Take 1 tablet by mouth daily.  01/21/18   [provider]  Menthol, Topical Analgesic, (BIOFREEZE) 4 % GEL Apply 1 application topically 3 (three) times daily.    [provider]  mirtazapine (REMERON) 30 MG tablet Take 30 mg by mouth at bedtime.    [provider]  Nutritional Supplements (NUTRITIONAL SUPPLEMENT PO) Diet Type: Heart healthy, NAS    [provider]  polyethylene glycol (MIRALAX / GLYCOLAX) packet Take 17 g by mouth daily.  05/29/18   [provider]  potassium chloride SA (K-DUR,KLOR-CON) 20 MEQ tablet Take 20 mEq by mouth daily as needed. Take with Lasix for edema    [provider]  tamsulosin (FLOMAX) 0.4 MG CAPS capsule Take 1 capsule (0.4 mg total) by mouth daily. 02/01/18   Enid Baas, MD    REVIEW OF SYSTEMS:  Review of Systems  Constitutional: Negative for chills, fever, malaise/fatigue and weight loss.  HENT: Negative for ear pain, hearing loss and tinnitus.   Eyes: Negative for blurred vision, double vision, pain and redness.  Respiratory: Negative for cough, hemoptysis and shortness of breath.   Cardiovascular: Negative for chest pain, palpitations, orthopnea and leg swelling.  Gastrointestinal: Positive for abdominal pain and blood in stool. Negative for constipation, diarrhea, nausea and vomiting.  Genitourinary: Positive for dysuria. Negative for  frequency and hematuria.  Musculoskeletal: Negative for back pain, joint pain and neck pain.  Skin:       No acne, rash, or lesions  Neurological: Negative for dizziness, tremors, focal weakness and weakness.  Endo/Heme/Allergies: Negative for polydipsia. Does not bruise/bleed easily.  Psychiatric/Behavioral: Negative for depression. The patient is not nervous/anxious and does not have insomnia.      VITAL SIGNS:   Vitals:   01/28/19 2130 01/28/19 2200 01/28/19 2230 01/28/19 2300  BP: (!) 107/37 (!) 92/53 (!) 110/48 (!) 106/52  Pulse:      Resp: 18 16 (!) 21 17  SpO2:      Weight:      Height:  Wt Readings from Last 3 Encounters:  01/28/19 109.1 kg  01/01/19 90.1 kg  12/07/18 88.5 kg    PHYSICAL EXAMINATION:  Physical Exam  Vitals reviewed. Constitutional: She is oriented to person, place, and time. She appears well-developed and well-nourished. No distress.  HENT:  Head: Normocephalic and atraumatic.  Mouth/Throat: Oropharynx is clear and moist.  Eyes: Pupils are equal, round, and reactive to light. Conjunctivae and EOM are normal. No scleral icterus.  Neck: Normal range of motion. Neck supple. No JVD present. No thyromegaly present.  Cardiovascular: Normal rate, regular rhythm and intact distal pulses. Exam reveals no gallop and no friction rub.  No murmur heard. Respiratory: Effort normal and breath sounds normal. No respiratory distress. She has no wheezes. She has no rales.  GI: Soft. Bowel sounds are normal. She exhibits no distension. There is no abdominal tenderness.  Musculoskeletal: Normal range of motion.        General: No edema.     Comments: No arthritis, no gout  Lymphadenopathy:    She has no cervical adenopathy.  Neurological: She is alert and oriented to person, place, and time. No cranial nerve deficit.  No dysarthria, no aphasia  Skin: Skin is warm and dry. No rash noted. No erythema.  Psychiatric: She has a normal mood and affect. Her behavior  is normal. Judgment and thought content normal.    LABORATORY PANEL:   CBC Recent Labs  Lab 01/28/19 2104  WBC 8.9  HGB 13.0  HCT 39.7  PLT 165   ------------------------------------------------------------------------------------------------------------------  Chemistries  Recent Labs  Lab 01/28/19 2104  NA 137  K 3.7  CL 99  CO2 27  GLUCOSE 137*  BUN 26*  CREATININE 1.30*  CALCIUM 8.7*  AST 24  ALT 24  ALKPHOS 80  BILITOT 0.6   ------------------------------------------------------------------------------------------------------------------  Cardiac Enzymes No results for input(s): TROPONINI in the last 168 hours. ------------------------------------------------------------------------------------------------------------------  RADIOLOGY:  Ct Abdomen Pelvis W Contrast  Result Date: 01/28/2019 CLINICAL DATA:  Abdominal pain and loose stools EXAM: CT ABDOMEN AND PELVIS WITH CONTRAST TECHNIQUE: Multidetector CT imaging of the abdomen and pelvis was performed using the standard protocol following bolus administration of intravenous contrast. CONTRAST:  OMNIPAQUE IOHEXOL 300 MG/ML  SOLN COMPARISON:  None. FINDINGS: LOWER CHEST: Bibasilar atelectasis. HEPATOBILIARY: The hepatic contours and density are normal. There is no intra- or extrahepatic biliary dilatation. The gallbladder is normal. PANCREAS: The pancreatic parenchymal contours are normal and there is no ductal dilatation. There is no peripancreatic fluid collection. SPLEEN: Normal. ADRENALS/URINARY TRACT: --Adrenal glands: Normal. --Right kidney/ureter: No hydronephrosis, nephroureterolithiasis, perinephric stranding or solid renal mass. --Left kidney/ureter: No hydronephrosis, nephroureterolithiasis, perinephric stranding or solid renal mass. --Urinary bladder: Mild urinary bladder wall thickening STOMACH/BOWEL: --Stomach/Duodenum: There is no hiatal hernia or other gastric abnormality. The duodenal course and  caliber are normal. --Small bowel: No dilatation or inflammation. --Colon: No focal abnormality. --Appendix: Normal. VASCULAR/LYMPHATIC: There is aortic atherosclerosis without hemodynamically significant stenosis. No abdominal or pelvic lymphadenopathy. REPRODUCTIVE: Status post hysterectomy. No adnexal mass. MUSCULOSKELETAL. There is grade 1 anterolisthesis at L4-5 secondary to bilateral facet hypertrophy. T12 compression fracture appears chronic. OTHER: None. IMPRESSION: 1. No acute abnormality of the abdomen or pelvis. 2. Urinary bladder wall thickening may be exaggerated by underdistention, or a sign of cystitis. 3. Mild calcific aortic atherosclerosis (ICD10-I70.0). 4. T12 compression fracture, most likely chronic. Electronically Signed   By: Deatra Robinson M.D.   On: 01/28/2019 23:16    EKG:   Orders placed or performed during  the hospital encounter of 02/12/18  . EKG 12-Lead  . EKG 12-Lead  . EKG    IMPRESSION AND PLAN:  Principal Problem:   Hematochezia -hemoglobin is stable.  We will keep her n.p.o., and get a GI consult Active Problems:   AKI (acute kidney injury) (HCC) -likely related to her UTI.  We will hydrate her gently, avoid nephrotoxins and monitor for improvement along with treatment for UTI as below   UTI (urinary tract infection) -IV Rocephin for now, convert to p.o. antibiotics at discharge   Diastolic congestive heart failure (HCC) -continue home meds   HTN (hypertension) -home dose antihypertensives   Dyslipidemia -home dose antilipid   Hypothyroidism due to acquired atrophy of thyroid -home dose thyroid replacement   Depression with anxiety -home dose antidepressant and anxiolytic  Chart review performed and case discussed with ED provider. Labs, imaging and/or ECG reviewed by provider and discussed with patient/family. Management plans discussed with the patient and/or family.  COVID-19 status: Test pending  DVT PROPHYLAXIS: Mechanical only  GI PROPHYLAXIS:   None  ADMISSION STATUS: Inpatient     CODE STATUS: Full Code Status History    Date Active Date Inactive Code Status Order ID Comments User Context   01/26/2018 1909 02/01/2018 2248 Full Code [id number]  Alford HighlandWieting, Richard, MD ED   06/10/2017 1103 06/15/2017 1746 Full Code [id number]  Milagros LollSudini, Srikar, MD ED      TOTAL TIME TAKING CARE OF THIS PATIENT: 45 minutes.   This patient was evaluated in the context of the global COVID-19 pandemic, which necessitated consideration that the patient might be at risk for infection with the SARS-CoV-2 virus that causes COVID-19. Institutional protocols and algorithms that pertain to the evaluation of patients at risk for COVID-19 are in a state of rapid change based on information released by regulatory bodies including the CDC and federal and state organizations. These policies and algorithms were followed to the best of this provider's knowledge to date during the patient's care at this facility.  Barney DrainDavid F Kyiah Canepa 01/28/2019, 11:46 PM  [facility]  Office  [phone]  CC: Primary care physician; Lauro RegulusAnderson, Marshall W, MD  Note:  This document was prepared using Dragon voice recognition software and may include unintentional dictation errors.

## 2019-01-29 ENCOUNTER — Encounter: Payer: Self-pay | Admitting: *Deleted

## 2019-01-29 DIAGNOSIS — K921 Melena: Secondary | ICD-10-CM

## 2019-01-29 LAB — CBC
HCT: 34.8 % — ABNORMAL LOW (ref 36.0–46.0)
Hemoglobin: 11.5 g/dL — ABNORMAL LOW (ref 12.0–15.0)
MCH: 30.5 pg (ref 26.0–34.0)
MCHC: 33 g/dL (ref 30.0–36.0)
MCV: 92.3 fL (ref 80.0–100.0)
Platelets: 147 10*3/uL — ABNORMAL LOW (ref 150–400)
RBC: 3.77 MIL/uL — ABNORMAL LOW (ref 3.87–5.11)
RDW: 14.6 % (ref 11.5–15.5)
WBC: 7.2 10*3/uL (ref 4.0–10.5)
nRBC: 0 % (ref 0.0–0.2)

## 2019-01-29 LAB — BASIC METABOLIC PANEL
Anion gap: 10 (ref 5–15)
BUN: 25 mg/dL — ABNORMAL HIGH (ref 8–23)
CO2: 29 mmol/L (ref 22–32)
Calcium: 8.4 mg/dL — ABNORMAL LOW (ref 8.9–10.3)
Chloride: 98 mmol/L (ref 98–111)
Creatinine, Ser: 1.09 mg/dL — ABNORMAL HIGH (ref 0.44–1.00)
GFR calc Af Amer: 56 mL/min — ABNORMAL LOW (ref >60)
GFR calc non Af Amer: 48 mL/min — ABNORMAL LOW (ref >60)
Glucose, Bld: 101 mg/dL — ABNORMAL HIGH (ref 70–99)
Potassium: 3.8 mmol/L (ref 3.5–5.1)
Sodium: 137 mmol/L (ref 135–145)

## 2019-01-29 LAB — SARS CORONAVIRUS 2 BY RT PCR (HOSPITAL ORDER, PERFORMED IN ~~LOC~~ HOSPITAL LAB): SARS Coronavirus 2: NEGATIVE

## 2019-01-29 LAB — IRON AND TIBC
Iron: 69 ug/dL (ref 28–170)
Saturation Ratios: 25 % (ref 10.4–31.8)
TIBC: 276 ug/dL (ref 250–450)
UIBC: 207 ug/dL

## 2019-01-29 LAB — MRSA PCR SCREENING: MRSA by PCR: NEGATIVE

## 2019-01-29 LAB — FERRITIN: Ferritin: 78 ng/mL (ref 11–307)

## 2019-01-29 LAB — ABO/RH: ABO/RH(D): A POS

## 2019-01-29 MED ORDER — ACETAMINOPHEN 325 MG PO TABS: 650.0000 mg | ORAL_TABLET | Freq: Four times a day (QID) | ORAL | Status: DC | PRN

## 2019-01-29 MED ORDER — ACETAMINOPHEN 650 MG RE SUPP: 650.0000 mg | Freq: Four times a day (QID) | RECTAL | Status: DC | PRN

## 2019-01-29 MED ORDER — ONDANSETRON HCL 4 MG PO TABS: 4.0000 mg | ORAL_TABLET | Freq: Four times a day (QID) | ORAL | Status: DC | PRN

## 2019-01-29 MED ORDER — POLYETHYLENE GLYCOL 3350 17 G PO PACK: 17.0000 g | PACK | Freq: Every day | ORAL | Status: DC

## 2019-01-29 MED ORDER — SODIUM CHLORIDE 0.9 % IV SOLN
INTRAVENOUS | Status: DC
  Administered 2019-01-29: 02:00:00 via INTRAVENOUS

## 2019-01-29 MED ORDER — LISINOPRIL-HYDROCHLOROTHIAZIDE 20-25 MG PO TABS: 1.0000 | ORAL_TABLET | Freq: Every day | ORAL | Status: DC

## 2019-01-29 MED ORDER — CITALOPRAM HYDROBROMIDE 20 MG PO TABS
20.0000 mg | ORAL_TABLET | Freq: Every day | ORAL | Status: DC
  Administered 2019-01-29 – 2019-01-30 (×2): 20 mg via ORAL
  Filled 2019-01-29 (×2): qty 1

## 2019-01-29 MED ORDER — ATORVASTATIN CALCIUM 20 MG PO TABS
80.0000 mg | ORAL_TABLET | Freq: Every day | ORAL | Status: DC
  Administered 2019-01-29: 80 mg via ORAL
  Filled 2019-01-29: qty 4

## 2019-01-29 MED ORDER — SODIUM CHLORIDE 0.9 % IV SOLN
1.0000 g | INTRAVENOUS | Status: DC
  Administered 2019-01-29 – 2019-01-30 (×2): 1 g via INTRAVENOUS
  Filled 2019-01-29: qty 10
  Filled 2019-01-29: qty 1
  Filled 2019-01-29: qty 10

## 2019-01-29 MED ORDER — LATANOPROST 0.005 % OP SOLN
1.0000 [drp] | Freq: Every day | OPHTHALMIC | Status: DC
  Administered 2019-01-29: 1 [drp] via OPHTHALMIC
  Filled 2019-01-29: qty 2.5

## 2019-01-29 MED ORDER — HYDROCHLOROTHIAZIDE 25 MG PO TABS
25.0000 mg | ORAL_TABLET | Freq: Every day | ORAL | Status: DC
  Administered 2019-01-29 – 2019-01-30 (×2): 25 mg via ORAL
  Filled 2019-01-29 (×2): qty 1

## 2019-01-29 MED ORDER — ONDANSETRON HCL 4 MG/2ML IJ SOLN: 4.0000 mg | Freq: Four times a day (QID) | INTRAMUSCULAR | Status: DC | PRN

## 2019-01-29 MED ORDER — DIVALPROEX SODIUM 250 MG PO DR TAB
250.0000 mg | DELAYED_RELEASE_TABLET | Freq: Three times a day (TID) | ORAL | Status: DC
  Administered 2019-01-29 – 2019-01-30 (×3): 250 mg via ORAL
  Filled 2019-01-29 (×5): qty 1

## 2019-01-29 MED ORDER — CLOPIDOGREL BISULFATE 75 MG PO TABS: 75.0000 mg | ORAL_TABLET | Freq: Every day | ORAL | Status: DC

## 2019-01-29 MED ORDER — LEVOTHYROXINE SODIUM 100 MCG PO TABS
100.0000 ug | ORAL_TABLET | Freq: Every day | ORAL | Status: DC
  Administered 2019-01-30: 06:00:00 100 ug via ORAL
  Filled 2019-01-29: qty 1

## 2019-01-29 MED ORDER — SODIUM CHLORIDE 0.9 % IV SOLN: INTRAVENOUS | Status: DC

## 2019-01-29 MED ORDER — MIRTAZAPINE 15 MG PO TABS
30.0000 mg | ORAL_TABLET | Freq: Every day | ORAL | Status: DC
  Administered 2019-01-29: 30 mg via ORAL
  Filled 2019-01-29: qty 2

## 2019-01-29 MED ORDER — TAMSULOSIN HCL 0.4 MG PO CAPS
0.4000 mg | ORAL_CAPSULE | Freq: Every day | ORAL | Status: DC
  Administered 2019-01-29 – 2019-01-30 (×2): 0.4 mg via ORAL
  Filled 2019-01-29 (×2): qty 1

## 2019-01-29 MED ORDER — LISINOPRIL 20 MG PO TABS
20.0000 mg | ORAL_TABLET | Freq: Every day | ORAL | Status: DC
  Administered 2019-01-29 – 2019-01-30 (×2): 20 mg via ORAL
  Filled 2019-01-29 (×2): qty 1

## 2019-01-29 MED ORDER — ASPIRIN 325 MG PO TABS
325.0000 mg | ORAL_TABLET | Freq: Every day | ORAL | Status: DC
  Filled 2019-01-29: qty 1

## 2019-01-29 NOTE — Consult Note (Signed)
Cephas Darby, MD [address]  [place]  [place], [place] [address]  Main: [phone]  Fax: [phone] Pager: [phone]   Consultation  Referring Provider:     No ref. provider found Primary Care Physician:  Kirk Ruths, MD Primary Gastroenterologist: None       Reason for Consultation:     Rectal bleeding  Date of Admission:  01/28/2019 Date of Consultation:  01/29/2019         HPI:   Shawna [name] is a 83 y.o. female with past medical history as listed below, history of chronic kidney disease, stroke, hyperlipidemia, hypertension who lives at nursing home brought by EMS yesterday due to an episode of central abdominal pain and bright red rectal bleeding that started after patient took antibiotics for UTI.  She did not have fever, chills, nausea or vomiting.  Patient did not have leukocytosis on admission.  She underwent CT abdomen and pelvis which did not reveal any acute GI pathology.  Her hemoglobin was 13 yesterday, 11.5 today which is actually her baseline.  She has mildly elevated BUN and creatinine compared to baseline.  She is on Plavix and aspirin 325 mg given her history of stroke.  She is currently being treated for UTI as well CT abdomen and pelvis did not reveal acute intra-abdominal pathology  She denies abdominal pain at present and any other symptoms like constipation, diarrhea.   NSAIDs: None  Antiplts/Anticoagulants/Anti thrombotics: Aspirin 325 and Plavix 75 mg for stroke  GI Procedures: Unknown  Past Medical History:  Diagnosis Date   Cerebral infarction ([place])    CKD (chronic kidney disease), stage III (HCC)    Depression    DJD (degenerative joint disease)    Edema    High cholesterol    Hyperlipidemia, unspecified    Hypertension    Hypothyroidism    Malaise    Morbid obesity ([place])    Osteoarthritis    Parathyroid abnormality (HCC)    Seizures ([place])    a. following remote stroke.   Stroke [facility]) 2002,  05/2017, 01/26/18, 01/30/18   a. 1964-->residual right arm wkns.  Previously on coumadin - pt says for just a few yrs.   Subdural hematoma ([place])    a. 10/2001 [facility] req temporal frontoparietal craniotomy following fall. ? whether or not pt on coumadin @ time.  Notes indicate yes but pt denies.   Thyroid disease    TMJ (dislocation of temporomandibular joint)    Tuberculosis    a. ~ 1950   Urinary incontinence    Weight loss     Past Surgical History:  Procedure Laterality Date   ABDOMINAL HYSTERECTOMY     BACK SURGERY     BRAIN SURGERY     cataract surgery     2014   LEFT HEART CATH AND CORONARY ANGIOGRAPHY N/A 06/13/2017   Procedure: LEFT HEART CATH AND CORONARY ANGIOGRAPHY;  Surgeon: Wellington Hampshire, MD;  Location: [facility] [place] [facility];  Service: Cardiovascular;  Laterality: N/A;   PARATHYROID ADENOMA REMOVAL     TEE WITHOUT CARDIOVERSION N/A 01/31/2018   Procedure: TRANSESOPHAGEAL ECHOCARDIOGRAM (TEE);  Surgeon: Nelva Bush, MD;  Location: [facility];  Service: Cardiovascular;  Laterality: N/A;   TEE WITHOUT CARDIOVERSION N/A 02/01/2018   Procedure: TRANSESOPHAGEAL ECHOCARDIOGRAM (TEE);  Surgeon: Minna Merritts, MD;  Location: [facility];  Service: Cardiovascular;  Laterality: N/A;    Prior to Admission medications   Medication Sig Start Date End Date Taking? Authorizing  Provider  acetaminophen (TYLENOL) 325 MG tablet Take 650 mg by mouth every 4 (four) hours as needed for mild pain or moderate pain.    Yes [provider]  aspirin 325 MG tablet Take 1 tablet (325 mg total) by mouth daily. 02/02/18  Yes Gladstone Lighter, MD  atorvastatin (LIPITOR) 80 MG tablet Take 1 tablet (80 mg total) by mouth daily at 6 PM. 02/01/18  Yes Gladstone Lighter, MD  cefUROXime (CEFTIN) 250 MG tablet Take 250 mg by mouth 2 (two) times daily with a meal. 01/28/19 02/01/19 Yes [provider]  citalopram (CELEXA) 20 MG tablet Take 20 mg by mouth daily.  06/20/18  Yes [provider]  cloNIDine (CATAPRES - DOSED IN MG/24 HR) 0.3 mg/24hr patch Place 1 patch onto the skin every Friday.  11/15/17  Yes [provider]  clopidogrel (PLAVIX) 75 MG tablet Take 1 tablet (75 mg total) by mouth daily. 02/02/18  Yes Gladstone Lighter, MD  divalproex (DEPAKOTE) 250 MG DR tablet Take 250 mg by mouth 3 (three) times daily.   Yes [provider]  latanoprost (XALATAN) 0.005 % ophthalmic solution Place 1 drop into both eyes at bedtime.    Yes [provider]  levothyroxine (SYNTHROID) 100 MCG tablet Take 100 mcg by mouth daily. 01/02/19  Yes [provider]  lisinopril-hydrochlorothiazide (PRINZIDE,ZESTORETIC) 20-25 MG tablet Take 1 tablet by mouth daily.  01/21/18  Yes [provider]  Menthol, Topical Analgesic, (BIOFREEZE) 4 % GEL Apply 1 application topically 3 (three) times daily.   Yes [provider]  mirtazapine (REMERON) 30 MG tablet Take 30 mg by mouth at bedtime.   Yes [provider]  polyethylene glycol (MIRALAX / GLYCOLAX) packet Take 17 g by mouth daily.  05/29/18  Yes [provider]  potassium chloride SA (K-DUR,KLOR-CON) 20 MEQ tablet Take 20 mEq by mouth daily as needed. Take with Lasix for edema   Yes [provider]  tamsulosin (FLOMAX) 0.4 MG CAPS capsule Take 1 capsule (0.4 mg total) by mouth daily. 02/01/18  Yes Gladstone Lighter, MD  vitamin B-12 (CYANOCOBALAMIN) 500 MCG tablet Take 500 mcg by mouth daily.   Yes [provider]    Current Facility-Administered Medications:    acetaminophen (TYLENOL) tablet 650 mg, 650 mg, Oral, Q6H PRN **OR** acetaminophen (TYLENOL) suppository 650 mg, 650 mg, Rectal, Q6H PRN, Lance Coon, MD   atorvastatin (LIPITOR) tablet 80 mg, 80 mg, Oral, q1800, Sudini, Srikar, MD   cefTRIAXone (ROCEPHIN) 1 g in sodium chloride 0.9 % 100 mL IVPB, 1 g, Intravenous, Q24H, Lance Coon, MD, Stopped at 01/29/19 [phone]   citalopram (CELEXA) tablet 20  mg, 20 mg, Oral, Daily, Sudini, Srikar, MD, 20 mg at 01/29/19 1445   divalproex (DEPAKOTE) DR tablet 250 mg, 250 mg, Oral, TID, Sudini, Srikar, MD, 250 mg at 01/29/19 1445   lisinopril (ZESTRIL) tablet 20 mg, 20 mg, Oral, Daily, 20 mg at 01/29/19 1445 **AND** hydrochlorothiazide (HYDRODIURIL) tablet 25 mg, 25 mg, Oral, Daily, Sudini, Srikar, MD, 25 mg at 01/29/19 1445   latanoprost (XALATAN) 0.005 % ophthalmic solution 1 drop, 1 drop, Both Eyes, QHS, Sudini, Srikar, MD   levothyroxine (SYNTHROID) tablet 100 mcg, 100 mcg, Oral, Q0600, Sudini, Srikar, MD   mirtazapine (REMERON) tablet 30 mg, 30 mg, Oral, QHS, Sudini, Srikar, MD   ondansetron (ZOFRAN) tablet 4 mg, 4 mg, Oral, Q6H PRN **OR** ondansetron (ZOFRAN) injection 4 mg, 4 mg, Intravenous, Q6H PRN, Lance Coon, MD   polyethylene glycol [facility] /  GLYCOLAX) packet 17 g, 17 g, Oral, Daily, Sudini, Srikar, MD   tamsulosin [facility]) capsule 0.4 mg, 0.4 mg, Oral, Daily, Sudini, Srikar, MD, 0.4 mg at 01/29/19 1445  Family History  Problem Relation Age of Onset   Heart failure Mother        died @ 92   Heart attack Father        died @ 86   Stroke Father    Breast cancer Neg Hx      Social History   Tobacco Use   Smoking status: Never Smoker   Smokeless tobacco: Never Used  Substance Use Topics   Alcohol use: No   Drug use: No    Allergies as of 01/28/2019   (No Known Allergies)    Review of Systems:    All systems reviewed and negative except where noted in HPI.   Physical Exam:  Vital signs in last 24 hours: Temp:  [98 F (36.7 C)-98.6 F (37 C)] 98 F (36.7 C) (06/01 1247) Pulse Rate:  [68-79] 73 (06/01 1247) Resp:  [11-22] 20 (06/01 1247) BP: (81-142)/(25-82) 140/64 (06/01 1247) SpO2:  [96 %-100 %] 99 % (06/01 1247) Weight:  [109.1 kg] 109.1 kg (05/31 2047) Last BM Date: 01/28/19 General:   Pleasant, cooperative in NAD Head:  Normocephalic and atraumatic. Eyes:   No icterus.   Conjunctiva pink.  PERRLA. Ears:  Normal auditory acuity. Neck:  Supple; no masses or thyroidomegaly Lungs: Respirations even and unlabored. Lungs clear to auscultation bilaterally.   No wheezes, crackles, or rhonchi.  Heart:  Regular rate and rhythm;  Without murmur, clicks, rubs or gallops Abdomen:  Soft, nondistended, nontender. Normal bowel sounds. No appreciable masses or hepatomegaly.  No rebound or guarding.  Rectal:  Not performed. Msk:  Symmetrical without gross deformities.  Strength left arm weakness  Extremities:  Without edema, cyanosis or clubbing. Neurologic:  Alert and oriented x3;  grossly normal neurologically. Skin:  Intact without significant lesions or rashes. Cervical Nodes:  No significant cervical adenopathy. Psych:  Alert and cooperative. Normal affect.  LAB RESULTS: CBC Latest Ref Rng & Units 01/29/2019 01/28/2019 12/29/2018  WBC 4.0 - 10.5 K/uL 7.2 8.9 7.1  Hemoglobin 12.0 - 15.0 g/dL 11.5(L) 13.0 11.5(L)  Hematocrit 36.0 - 46.0 % 34.8(L) 39.7 35.7(L)  Platelets 150 - 400 K/uL 147(L) 165 163    BMET BMP Latest Ref Rng & Units 01/29/2019 01/28/2019 12/29/2018  Glucose 70 - 99 mg/dL 101(H) 137(H) 95  BUN 8 - 23 mg/dL 25(H) 26(H) 24(H)  Creatinine 0.44 - 1.00 mg/dL 1.09(H) 1.30(H) 0.89  Sodium 135 - 145 mmol/L 137 137 138  Potassium 3.5 - 5.1 mmol/L 3.8 3.7 3.7  Chloride 98 - 111 mmol/L 98 99 98  CO2 22 - 32 mmol/L '29 27 30  ' Calcium 8.9 - 10.3 mg/dL 8.4(L) 8.7(L) 8.9    LFT Hepatic Function Latest Ref Rng & Units 01/28/2019 12/29/2018 10/04/2018  Total Protein 6.5 - 8.1 g/dL 6.4(L) 6.3(L) 5.7(L)  Albumin 3.5 - 5.0 g/dL 3.1(L) 3.0(L) 3.0(L)  AST 15 - 41 U/L '24 28 18  ' ALT 0 - 44 U/L '24 27 16  ' Alk Phosphatase 38 - 126 U/L 80 79 62  Total Bilirubin 0.3 - 1.2 mg/dL 0.6 0.6 0.4     STUDIES: Ct Abdomen Pelvis W Contrast  Result Date: 01/28/2019 CLINICAL DATA:  Abdominal pain and loose stools EXAM: CT ABDOMEN AND PELVIS WITH CONTRAST TECHNIQUE: Multidetector CT imaging of the abdomen  and pelvis was performed using the  standard protocol following bolus administration of intravenous contrast. CONTRAST:  152m OMNIPAQUE IOHEXOL 300 MG/ML  SOLN COMPARISON:  None. FINDINGS: LOWER CHEST: Bibasilar atelectasis. HEPATOBILIARY: The hepatic contours and density are normal. There is no intra- or extrahepatic biliary dilatation. The gallbladder is normal. PANCREAS: The pancreatic parenchymal contours are normal and there is no ductal dilatation. There is no peripancreatic fluid collection. SPLEEN: Normal. ADRENALS/URINARY TRACT: --Adrenal glands: Normal. --Right kidney/ureter: No hydronephrosis, nephroureterolithiasis, perinephric stranding or solid renal mass. --Left kidney/ureter: No hydronephrosis, nephroureterolithiasis, perinephric stranding or solid renal mass. --Urinary bladder: Mild urinary bladder wall thickening STOMACH/BOWEL: --Stomach/Duodenum: There is no hiatal hernia or other gastric abnormality. The duodenal course and caliber are normal. --Small bowel: No dilatation or inflammation. --Colon: No focal abnormality. --Appendix: Normal. VASCULAR/LYMPHATIC: There is aortic atherosclerosis without hemodynamically significant stenosis. No abdominal or pelvic lymphadenopathy. REPRODUCTIVE: Status post hysterectomy. No adnexal mass. MUSCULOSKELETAL. There is grade 1 anterolisthesis at L4-5 secondary to bilateral facet hypertrophy. T12 compression fracture appears chronic. OTHER: None. IMPRESSION: 1. No acute abnormality of the abdomen or pelvis. 2. Urinary bladder wall thickening may be exaggerated by underdistention, or a sign of cystitis. 3. Mild calcific aortic atherosclerosis (ICD10-I70.0). 4. T12 compression fracture, most likely chronic. Electronically Signed   By: KUlyses JarredM.D.   On: 01/28/2019 23:16      Impression / Plan:   Shawna CONNONis a 83y.o. female with multiple comorbidities on aspirin 3240mand plavix 7566mresented with abdominal pain and rectal bleeding on  treatment for UTI.  Differentials include infectious colitis or NSAID induced colitis/ulcers or bleeding hemorrhoids or diverticulitis or less likely malignancy Hold aspirin and Plavix Recommend stool studies to rule out C. difficile infection and other GI pathogens due to use of antibiotics Do not recommend long-term aspirin 325m36m not medically indicated Okay with full liquid diet Monitor hemoglobin closely Defer any endoscopic intervention at this time   Thank you for involving me in the care of this patient.  Will follow along with you    LOS: 1 day   RohiSherri Sear  01/29/2019, 3:17 PM   Note: This dictation was prepared with Dragon dictation along with smaller phrase technology. Any transcriptional errors that result from this process are unintentional.

## 2019-01-29 NOTE — Progress Notes (Signed)
[facility] [place] [facility]   PATIENT NAME: Shawna [name]    MR#:  [id number]  DATE OF BIRTH:  07/02/1936  SUBJECTIVE:  CHIEF COMPLAINT:   Chief Complaint  Patient presents with  . Abdominal Pain  . Rectal Bleeding   No further bleeding today  REVIEW OF SYSTEMS:    Review of Systems  Unable to perform ROS: Dementia   DRUG ALLERGIES:  No Known Allergies  VITALS:  Blood pressure 140/64, pulse 73, temperature 98 F (36.7 C), temperature source Oral, resp. rate 20, height 5\' 4"  (1.626 m), weight 109.1 kg, SpO2 99 %.  PHYSICAL EXAMINATION:   Physical Exam  GENERAL:  83 y.o.-year-old patient lying in the bed with no acute distress.  EYES: Pupils equal, round, reactive to light and accommodation. No scleral icterus. Extraocular muscles intact.  HEENT: Head atraumatic, normocephalic. Oropharynx and nasopharynx clear.  NECK:  Supple, no jugular venous distention. No thyroid enlargement, no tenderness.  LUNGS: Normal breath sounds bilaterally, no wheezing, rales, rhonchi. No use of accessory muscles of respiration.  CARDIOVASCULAR: S1, S2 normal. No murmurs, rubs, or gallops.  ABDOMEN: Soft, nontender, nondistended. Bowel sounds present. No organomegaly or mass.  EXTREMITIES: No cyanosis, clubbing or edema b/l.    NEUROLOGIC: Cranial nerves II through XII are intact. No focal Motor or sensory deficits b/l.   PSYCHIATRIC: The patient is alert and awake.  Oriented into 1 SKIN: No obvious rash, lesion, or ulcer.   LABORATORY PANEL:   CBC Recent Labs  Lab 01/29/19 0550  WBC 7.2  HGB 11.5*  HCT 34.8*  PLT 147*   ------------------------------------------------------------------------------------------------------------------ Chemistries  Recent Labs  Lab 01/28/19 2104 01/29/19 0550  NA 137 137  K 3.7 3.8  CL 99 98  CO2 27 29  GLUCOSE 137* 101*  BUN 26* 25*  CREATININE 1.30* 1.09*  CALCIUM 8.7* 8.4*  AST 24  --   ALT 24  --   ALKPHOS 80   --   BILITOT 0.6  --    ------------------------------------------------------------------------------------------------------------------  Cardiac Enzymes No results for input(s): TROPONINI in the last 168 hours. ------------------------------------------------------------------------------------------------------------------  RADIOLOGY:  Ct Abdomen Pelvis W Contrast  Result Date: 01/28/2019 CLINICAL DATA:  Abdominal pain and loose stools EXAM: CT ABDOMEN AND PELVIS WITH CONTRAST TECHNIQUE: Multidetector CT imaging of the abdomen and pelvis was performed using the standard protocol following bolus administration of intravenous contrast. CONTRAST:  100mL OMNIPAQUE IOHEXOL 300 MG/ML  SOLN COMPARISON:  None. FINDINGS: LOWER CHEST: Bibasilar atelectasis. HEPATOBILIARY: The hepatic contours and density are normal. There is no intra- or extrahepatic biliary dilatation. The gallbladder is normal. PANCREAS: The pancreatic parenchymal contours are normal and there is no ductal dilatation. There is no peripancreatic fluid collection. SPLEEN: Normal. ADRENALS/URINARY TRACT: --Adrenal glands: Normal. --Right kidney/ureter: No hydronephrosis, nephroureterolithiasis, perinephric stranding or solid renal mass. --Left kidney/ureter: No hydronephrosis, nephroureterolithiasis, perinephric stranding or solid renal mass. --Urinary bladder: Mild urinary bladder wall thickening STOMACH/BOWEL: --Stomach/Duodenum: There is no hiatal hernia or other gastric abnormality. The duodenal course and caliber are normal. --Small bowel: No dilatation or inflammation. --Colon: No focal abnormality. --Appendix: Normal. VASCULAR/LYMPHATIC: There is aortic atherosclerosis without hemodynamically significant stenosis. No abdominal or pelvic lymphadenopathy. REPRODUCTIVE: Status post hysterectomy. No adnexal mass. MUSCULOSKELETAL. There is grade 1 anterolisthesis at L4-5 secondary to bilateral facet hypertrophy. T12 compression fracture  appears chronic. OTHER: None. IMPRESSION: 1. No acute abnormality of the abdomen or pelvis. 2. Urinary bladder wall thickening may be exaggerated by underdistention, or a sign of cystitis.  3. Mild calcific aortic atherosclerosis (ICD10-I70.0). 4. T12 compression fracture, most likely chronic. Electronically Signed   By: Deatra Robinson M.D.   On: 01/28/2019 23:16     ASSESSMENT AND PLAN:     Hematochezia -hemoglobin, with mild drop.  No need for transfusion.  Monitor.  GI consulted. They diverticular bleed.    AKI (acute kidney injury) Likely due to UTI and dehydration.  Improving with IV fluids.  Monitor input and output.  Repeat labs in the morning.    UTI (urinary tract infection) -IV Rocephin for now, convert to p.o. antibiotics at discharge Urine cultures with E. coli    Diastolic congestive heart failure  -continue home meds    HTN (hypertension) -home dose antihypertensives    Dyslipidemia -home dose antilipid    Hypothyroidism  Continue levothyroxine    Depression with anxiety -home dose antidepressant and anxiolytic.  All the records are reviewed and case discussed with Care Management/Social Workerr. Management plans discussed with the patient, family and they are in agreement.  CODE STATUS: Full code  DVT Prophylaxis: SCDs  TOTAL TIME TAKING CARE OF THIS PATIENT: 35 minutes.   POSSIBLE D/C IN 1-2 DAYS, DEPENDING ON CLINICAL CONDITION.  Molinda Bailiff Reeshemah Nazaryan M.D on 01/29/2019 at 1:35 PM  Between 7am to 6pm - Pager - [phone]  After 6pm go to [email or web address] - password [email or web address] [facility]  [facility]  Office  [phone]  CC: Primary care physician; Lauro Regulus, MD  Note: This dictation was prepared with Dragon dictation along with smaller phrase technology. Any transcriptional errors that result from this process are unintentional.

## 2019-01-29 NOTE — Progress Notes (Signed)
01/29/2019 1:35 PM  Attempted to call patient's daughter to update and complete admission profile.  No answer.  Will attempt another call later.  Bradly Chris, RN

## 2019-01-29 NOTE — ED Notes (Signed)
Called and updated patient's daughter, [name] regarding patient condition and that she is still holding in the ED awaiting admission bed.

## 2019-01-29 NOTE — ED Notes (Addendum)
Assumed care of patient oriented to name, but able to follow commands. Denies pain, vss. Incontinent of bladder. Peri care given. Wick place to wall suction.Marland Kitchen Awaiting bed status. Safety maintained.

## 2019-01-29 NOTE — ED Notes (Signed)
Patient resting quietly with eyes closed.  No acute distress noted.  Patient easily aroused for lab draw.

## 2019-01-29 NOTE — ED Notes (Signed)
Patient cleansed of large bowel movement, orange in color.

## 2019-01-29 NOTE — ED Notes (Signed)
ED TO INPATIENT HANDOFF REPORT  ED Nurse Name and Phone #:    S Name/Age/Gender Shawna [name] 83 y.o. female Room/Bed: ED05A/ED05A  Code Status   Code Status: Full Code  Home/SNF/Other Nursing Home Patient oriented to: self Is this baseline? Yes   Triage Complete: Triage complete  Chief Complaint abdominal pain  Triage Note Patient to Rm 5 via EMS from local nsg home.  Patient started antibiotic today for urinary tract infection.  After taking first dose began to experience abdominal pain and had loose stools that appeared to have blood in it.   Allergies No Known Allergies  Level of Care/Admitting Diagnosis ED Disposition    ED Disposition Condition Comment   Admit  Hospital Area: [facility] [[id number]]  Level of Care: Med-Surg [16]  Covid Evaluation: Screening Protocol (No Symptoms)  Diagnosis: Hematochezia [[id number]][[id number]]  Admitting Physician: Oralia ManisWILLIS, DAVID [[id number]]  Attending Physician: Anne HahnWILLIS, DAVID [phone][[id number]]  Estimated length of stay: past midnight tomorrow  Certification:: I certify this patient will need inpatient services for at least 2 midnights  PT Class (Do Not Modify): Inpatient [101]  PT Acc Code (Do Not Modify): Private [1]       B Medical/Surgery History Past Medical History:  Diagnosis Date  . Cerebral infarction (HCC)   . CKD (chronic kidney disease), stage III (HCC)   . Depression   . DJD (degenerative joint disease)   . Edema   . High cholesterol   . Hyperlipidemia, unspecified   . Hypertension   . Hypothyroidism   . Malaise   . Morbid obesity (HCC)   . Osteoarthritis   . Parathyroid abnormality (HCC)   . Seizures (HCC)    a. following remote stroke.  . Stroke (HCC) 2002, 05/2017, 01/26/18, 01/30/18   a. 1964-->residual right arm wkns.  Previously on coumadin - pt says for just a few yrs.  . Subdural hematoma (HCC)    a. 10/2001 [facility] req temporal frontoparietal craniotomy following fall. ? whether or not pt on  coumadin @ time.  Notes indicate yes but pt denies.  . Thyroid disease   . TMJ (dislocation of temporomandibular joint)   . Tuberculosis    a. ~ 1950  . Urinary incontinence   . Weight loss    Past Surgical History:  Procedure Laterality Date  . ABDOMINAL HYSTERECTOMY    . BACK SURGERY    . BRAIN SURGERY    . cataract surgery     2014  . LEFT HEART CATH AND CORONARY ANGIOGRAPHY N/A 06/13/2017   Procedure: LEFT HEART CATH AND CORONARY ANGIOGRAPHY;  Surgeon: Iran OuchArida, Muhammad A, MD;  Location: [facility] INVASIVE CV LAB;  Service: Cardiovascular;  Laterality: N/A;  . PARATHYROID ADENOMA REMOVAL    . TEE WITHOUT CARDIOVERSION N/A 01/31/2018   Procedure: TRANSESOPHAGEAL ECHOCARDIOGRAM (TEE);  Surgeon: Yvonne KendallEnd, Christopher, MD;  Location: [facility] ORS;  Service: Cardiovascular;  Laterality: N/A;  . TEE WITHOUT CARDIOVERSION N/A 02/01/2018   Procedure: TRANSESOPHAGEAL ECHOCARDIOGRAM (TEE);  Surgeon: Antonieta IbaGollan, Timothy J, MD;  Location: [facility] ORS;  Service: Cardiovascular;  Laterality: N/A;     A IV Location/Drains/Wounds Patient Lines/Drains/Airways Status   Active Line/Drains/Airways    Name:   Placement date:   Placement time:   Site:   Days:   Peripheral IV 01/28/19 Right Antecubital   01/28/19    2108    Antecubital   1          Intake/Output Last 24 hours  Intake/Output Summary (Last 24 hours) at 01/29/2019 1218  Last data filed at 01/29/2019 1132 Gross per 24 hour  Intake 1100 ml  Output -  Net 1100 ml    Labs/Imaging Results for orders placed or performed during the hospital encounter of 01/28/19 (from the past 48 hour(s))  CBC     Status: None   Collection Time: 01/28/19  9:04 PM  Result Value Ref Range   WBC 8.9 4.0 - 10.5 K/uL   RBC 4.31 3.87 - 5.11 MIL/uL   Hemoglobin 13.0 12.0 - 15.0 g/dL   HCT 46.9 62.9 - 52.8 %   MCV 92.1 80.0 - 100.0 fL   MCH 30.2 26.0 - 34.0 pg   MCHC 32.7 30.0 - 36.0 g/dL   RDW 41.3 24.4 - 01.0 %   Platelets 165 150 - 400 K/uL   nRBC 0.0 0.0 - 0.2 %     Comment: Performed at [facility], [address] [place], [place] [address]  Comprehensive metabolic panel     Status: Abnormal   Collection Time: 01/28/19  9:04 PM  Result Value Ref Range   Sodium 137 135 - 145 mmol/L   Potassium 3.7 3.5 - 5.1 mmol/L   Chloride 99 98 - 111 mmol/L   CO2 27 22 - 32 mmol/L   Glucose, Bld 137 (H) 70 - 99 mg/dL   BUN 26 (H) 8 - 23 mg/dL   Creatinine, Ser 6.64 (H) 0.44 - 1.00 mg/dL   Calcium 8.7 (L) 8.9 - 10.3 mg/dL   Total Protein 6.4 (L) 6.5 - 8.1 g/dL   Albumin 3.1 (L) 3.5 - 5.0 g/dL   AST 24 15 - 41 U/L   ALT 24 0 - 44 U/L   Alkaline Phosphatase 80 38 - 126 U/L   Total Bilirubin 0.6 0.3 - 1.2 mg/dL   GFR calc non Af Amer 39 (L) >60 mL/min   GFR calc Af Amer 45 (L) >60 mL/min   Anion gap 11 5 - 15    Comment: Performed at [facility], [address] [place], [place] [address]  Protime-INR     Status: None   Collection Time: 01/28/19  9:04 PM  Result Value Ref Range   Prothrombin Time 12.5 11.4 - 15.2 seconds   INR 0.9 0.8 - 1.2    Comment: (NOTE) INR goal varies based on device and disease states. Performed at [facility], [address] [place], [place] [address]   ABO/Rh     Status: None   Collection Time: 01/28/19  9:04 PM  Result Value Ref Range   ABO/RH(D)      A POS Performed at [facility] [address] [place], [place] [address]   Type and screen Ordered by PROVIDER DEFAULT     Status: None (Preliminary result)   Collection Time: 01/28/19 10:29 PM  Result Value Ref Range   ABO/RH(D) A POS    Antibody Screen POS    Sample Expiration 01/31/2019,2359    Antibody Identification ANTI K ANTI FYA (Duffy a)    Unit Number [id number]    Blood Component Type RED CELLS,LR    Unit division 00    Status of Unit ALLOCATED    Transfusion Status OK TO TRANSFUSE    Crossmatch Result      COMPATIBLE Performed at [facility], [address] [place], [place] [address]    Unit  Number [id number]    Blood Component Type RED CELLS,LR    Unit division 00    Status of Unit  ALLOCATED    Transfusion Status OK TO TRANSFUSE    Crossmatch Result COMPATIBLE   SARS Coronavirus 2 (CEPHEID - Performed in [facility] hospital lab), Hosp Order     Status: None   Collection Time: 01/28/19 11:09 PM  Result Value Ref Range   SARS Coronavirus 2 NEGATIVE NEGATIVE    Comment: (NOTE) If result is NEGATIVE SARS-CoV-2 target nucleic acids are NOT DETECTED. The SARS-CoV-2 RNA is generally detectable in upper and lower  respiratory specimens during the acute phase of infection. The lowest  concentration of SARS-CoV-2 viral copies this assay can detect is 250  copies / mL. A negative result does not preclude SARS-CoV-2 infection  and should not be used as the sole basis for treatment or other  patient management decisions.  A negative result may occur with  improper specimen collection / handling, submission of specimen other  than nasopharyngeal swab, presence of viral mutation(s) within the  areas targeted by this assay, and inadequate number of viral copies  (<250 copies / mL). A negative result must be combined with clinical  observations, patient history, and epidemiological information. If result is POSITIVE SARS-CoV-2 target nucleic acids are DETECTED. The SARS-CoV-2 RNA is generally detectable in upper and lower  respiratory specimens dur ing the acute phase of infection.  Positive  results are indicative of active infection with SARS-CoV-2.  Clinical  correlation with patient history and other diagnostic information is  necessary to determine patient infection status.  Positive results do  not rule out bacterial infection or co-infection with other viruses. If result is PRESUMPTIVE POSTIVE SARS-CoV-2 nucleic acids MAY BE PRESENT.   A presumptive positive result was obtained on the submitted specimen  and confirmed on repeat testing.  While 2019 novel coronavirus   (SARS-CoV-2) nucleic acids may be present in the submitted sample  additional confirmatory testing may be necessary for epidemiological  and / or clinical management purposes  to differentiate between  SARS-CoV-2 and other Sarbecovirus currently known to infect humans.  If clinically indicated additional testing with an alternate test  methodology [phone]) is advised. The SARS-CoV-2 RNA is generally  detectable in upper and lower respiratory sp ecimens during the acute  phase of infection. The expected result is Negative. Fact Sheet for Patients:  BoilerBrush.com.cy Fact Sheet for Healthcare Providers: https://pope.com/ This test is not yet approved or cleared by the [place] FDA and has been authorized for detection and/or diagnosis of SARS-CoV-2 by FDA under an Emergency Use Authorization (EUA).  This EUA will remain in effect (meaning this test can be used) for the duration of the COVID-19 declaration under Section 564(b)(1) of the Act, 21 U.S.C. section 360bbb-3(b)(1), unless the authorization is terminated or revoked sooner. Performed at [facility], [address] [place], [place] [address]   Basic metabolic panel     Status: Abnormal   Collection Time: 01/29/19  5:50 AM  Result Value Ref Range   Sodium 137 135 - 145 mmol/L   Potassium 3.8 3.5 - 5.1 mmol/L   Chloride 98 98 - 111 mmol/L   CO2 29 22 - 32 mmol/L   Glucose, Bld 101 (H) 70 - 99 mg/dL   BUN 25 (H) 8 - 23 mg/dL   Creatinine, Ser 9.56 (H) 0.44 - 1.00 mg/dL   Calcium 8.4 (L) 8.9 - 10.3 mg/dL   GFR calc non Af Amer 48 (L) >60 mL/min   GFR calc Af Amer 56 (L) >60 mL/min   Anion gap 10 5 -  15    Comment: Performed at [facility], [address] [place], [place] [address]  CBC     Status: Abnormal   Collection Time: 01/29/19  5:50 AM  Result Value Ref Range   WBC 7.2 4.0 - 10.5 K/uL   RBC 3.77 (L) 3.87 - 5.11 MIL/uL   Hemoglobin 11.5  (L) 12.0 - 15.0 g/dL   HCT 19.1 (L) 47.8 - 29.5 %   MCV 92.3 80.0 - 100.0 fL   MCH 30.5 26.0 - 34.0 pg   MCHC 33.0 30.0 - 36.0 g/dL   RDW 62.1 30.8 - 65.7 %   Platelets 147 (L) 150 - 400 K/uL   nRBC 0.0 0.0 - 0.2 %    Comment: Performed at [facility], [address] [place], [place] [address]   Ct Abdomen Pelvis W Contrast  Result Date: 01/28/2019 CLINICAL DATA:  Abdominal pain and loose stools EXAM: CT ABDOMEN AND PELVIS WITH CONTRAST TECHNIQUE: Multidetector CT imaging of the abdomen and pelvis was performed using the standard protocol following bolus administration of intravenous contrast. CONTRAST:  OMNIPAQUE IOHEXOL 300 MG/ML  SOLN COMPARISON:  None. FINDINGS: LOWER CHEST: Bibasilar atelectasis. HEPATOBILIARY: The hepatic contours and density are normal. There is no intra- or extrahepatic biliary dilatation. The gallbladder is normal. PANCREAS: The pancreatic parenchymal contours are normal and there is no ductal dilatation. There is no peripancreatic fluid collection. SPLEEN: Normal. ADRENALS/URINARY TRACT: --Adrenal glands: Normal. --Right kidney/ureter: No hydronephrosis, nephroureterolithiasis, perinephric stranding or solid renal mass. --Left kidney/ureter: No hydronephrosis, nephroureterolithiasis, perinephric stranding or solid renal mass. --Urinary bladder: Mild urinary bladder wall thickening STOMACH/BOWEL: --Stomach/Duodenum: There is no hiatal hernia or other gastric abnormality. The duodenal course and caliber are normal. --Small bowel: No dilatation or inflammation. --Colon: No focal abnormality. --Appendix: Normal. VASCULAR/LYMPHATIC: There is aortic atherosclerosis without hemodynamically significant stenosis. No abdominal or pelvic lymphadenopathy. REPRODUCTIVE: Status post hysterectomy. No adnexal mass. MUSCULOSKELETAL. There is grade 1 anterolisthesis at L4-5 secondary to bilateral facet hypertrophy. T12 compression fracture appears chronic. OTHER: None.  IMPRESSION: 1. No acute abnormality of the abdomen or pelvis. 2. Urinary bladder wall thickening may be exaggerated by underdistention, or a sign of cystitis. 3. Mild calcific aortic atherosclerosis (ICD10-I70.0). 4. T12 compression fracture, most likely chronic. Electronically Signed   By: Deatra Robinson M.D.   On: 01/28/2019 23:16    Pending Labs Unresulted Labs (From admission, onward)   None      Vitals/Pain Today's Vitals   01/29/19 0730 01/29/19 0851 01/29/19 0900 01/29/19 1134  BP: 125/82 (!) 142/66 133/68 133/68  Pulse: 70 73 70 70  Resp: Temp:    98.6 F (37 C)  TempSrc:    Oral  SpO2: 100% 98% 97% 97%  Weight:      Height:      PainSc:  0-No pain  0-No pain    Isolation Precautions No active isolations  Medications Medications  0.9 %  sodium chloride infusion (75 mL/hr Intravenous Not Given 01/29/19 1132)  acetaminophen (TYLENOL) tablet 650 mg (has no administration in time range)    Or  acetaminophen (TYLENOL) suppository 650 mg (has no administration in time range)  ondansetron (ZOFRAN) tablet 4 mg (has no administration in time range)    Or  ondansetron (ZOFRAN) injection 4 mg (has no administration in time range)  cefTRIAXone (ROCEPHIN) 1 g in sodium chloride 0.9 % 100 mL IVPB ( Intravenous Stopped 01/29/19 0649)  0.9 %  sodium chloride infusion (  Intravenous Stopped 01/29/19 1132)  iohexol (OMNIPAQUE) 240 MG/ML injection 50 mL (50 mLs Oral Contrast Given 01/28/19 2119)  iohexol (OMNIPAQUE) 300 MG/ML solution 100 mL (100 mLs Intravenous Contrast Given 01/28/19 2243)    Mobility walks with device Low fall risk   Focused Assessments GI   R Recommendations: See Admitting Provider Note  Report given to:   Additional Notes:

## 2019-01-29 NOTE — ED Notes (Signed)
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

## 2019-01-30 ENCOUNTER — Encounter
Admission: RE | Admit: 2019-01-30 | Discharge: 2019-01-30 | Disposition: A | Discharge status: Home / Self Care | Payer: Medicare Other | Source: Ambulatory Visit | Attending: Internal Medicine | Admitting: Internal Medicine

## 2019-01-30 DIAGNOSIS — N189 Chronic kidney disease, unspecified: Secondary | ICD-10-CM | POA: Insufficient documentation

## 2019-01-30 DIAGNOSIS — I130 Hypertensive heart and chronic kidney disease with heart failure and stage 1 through stage 4 chronic kidney disease, or unspecified chronic kidney disease: Secondary | ICD-10-CM | POA: Insufficient documentation

## 2019-01-30 LAB — CBC WITH DIFFERENTIAL/PLATELET
Abs Immature Granulocytes: 0.03 10*3/uL (ref 0.00–0.07)
Basophils Absolute: 0 10*3/uL (ref 0.0–0.1)
Basophils Relative: 0 %
Eosinophils Absolute: 0.3 10*3/uL (ref 0.0–0.5)
Eosinophils Relative: 3 %
HCT: 39.8 % (ref 36.0–46.0)
Hemoglobin: 12.8 g/dL (ref 12.0–15.0)
Immature Granulocytes: 0 %
Lymphocytes Relative: 15 %
Lymphs Abs: 1.2 10*3/uL (ref 0.7–4.0)
MCH: 30.1 pg (ref 26.0–34.0)
MCHC: 32.2 g/dL (ref 30.0–36.0)
MCV: 93.6 fL (ref 80.0–100.0)
Monocytes Absolute: 1.2 10*3/uL — ABNORMAL HIGH (ref 0.1–1.0)
Monocytes Relative: 14 %
Neutro Abs: 5.5 10*3/uL (ref 1.7–7.7)
Neutrophils Relative %: 68 %
Platelets: 161 10*3/uL (ref 150–400)
RBC: 4.25 MIL/uL (ref 3.87–5.11)
RDW: 14.6 % (ref 11.5–15.5)
WBC: 8.2 10*3/uL (ref 4.0–10.5)
nRBC: 0 % (ref 0.0–0.2)

## 2019-01-30 LAB — BASIC METABOLIC PANEL
Anion gap: 12 (ref 5–15)
BUN: 15 mg/dL (ref 8–23)
CO2: 27 mmol/L (ref 22–32)
Calcium: 9.1 mg/dL (ref 8.9–10.3)
Chloride: 99 mmol/L (ref 98–111)
Creatinine, Ser: 0.82 mg/dL (ref 0.44–1.00)
GFR calc Af Amer: >60 mL/min (ref >60)
GFR calc non Af Amer: >60 mL/min (ref >60)
Glucose, Bld: 116 mg/dL — ABNORMAL HIGH (ref 70–99)
Potassium: 3.6 mmol/L (ref 3.5–5.1)
Sodium: 138 mmol/L (ref 135–145)

## 2019-01-30 MED ORDER — SODIUM CHLORIDE 0.9 % IV SOLN
INTRAVENOUS | Status: DC | PRN
  Administered 2019-01-30: 250 mL via INTRAVENOUS

## 2019-01-30 MED ORDER — ASPIRIN EC 81 MG PO TBEC: 81.0000 mg | DELAYED_RELEASE_TABLET | Freq: Every day | ORAL | Status: DC

## 2019-01-30 NOTE — NC FL2 (Signed)
[place] MEDICAID FL2 LEVEL OF CARE SCREENING TOOL     IDENTIFICATION  Patient Name: Shawna [name] Birthdate: 04/01/1936 Sex: female Admission Date (Current Location): 01/28/2019  [place]   Facility and Address:  [facility], [address], [place], [place] [address]      Provider Number: [id number]  Attending Physician Name and Address:  Milagros LollSudini, Srikar, MD  Relative Name and Phone Number:       Current Level of Care: Hospital Recommended Level of Care: Skilled Nursing Facility Prior Approval Number:    Date Approved/Denied:   PASRR Number:    Discharge Plan: SNF    Current Diagnoses: Patient Active Problem List   Diagnosis Date Noted  . HTN (hypertension) 01/28/2019  . UTI (urinary tract infection) 01/28/2019  . Hematochezia 01/28/2019  . Chronic retention of urine 08/06/2018  . Hypertensive heart disease with congestive heart failure (HCC) 03/06/2018  . AKI (acute kidney injury) (HCC) 03/06/2018  . Dyslipidemia 03/06/2018  . Hypothyroidism due to acquired atrophy of thyroid 03/06/2018  . Glaucoma (increased eye pressure) 03/06/2018  . Depression with anxiety 03/06/2018  . Chronic cerebrovascular accident 01/26/2018  . Diastolic congestive heart failure (HCC) 07/28/2017    Orientation RESPIRATION BLADDER Height & Weight     Self, Place, Situation, Time  Normal Continent Weight: 240 lb 8.4 oz (109.1 kg) Height:  5\' 4"  (162.6 cm)  BEHAVIORAL SYMPTOMS/MOOD NEUROLOGICAL BOWEL NUTRITION STATUS  (none) Convulsions/Seizures Continent Diet(regular)  AMBULATORY STATUS COMMUNICATION OF NEEDS Skin   Extensive Assist Verbally Normal                       Personal Care Assistance Level of Assistance  Bathing, Dressing, Feeding Bathing Assistance: Limited assistance Feeding assistance: Limited assistance Dressing Assistance: Limited assistance     Functional Limitations Info             SPECIAL  CARE FACTORS FREQUENCY                       Contractures Contractures Info: Not present    Additional Factors Info  Code Status Code Status Info: full             Current Medications (01/30/2019):  This is the current hospital active medication list Current Facility-Administered Medications  Medication Dose Route Frequency Provider Last Rate Last Dose  . 0.9 %  sodium chloride infusion   Intravenous PRN Milagros LollSudini, Srikar, MD   Stopped at 01/30/19 0435  . acetaminophen (TYLENOL) tablet 650 mg  650 mg Oral Q6H PRN Oralia ManisWillis, David, MD       Or  . acetaminophen (TYLENOL) suppository 650 mg  650 mg Rectal Q6H PRN Oralia ManisWillis, David, MD      . atorvastatin (LIPITOR) tablet 80 mg  80 mg Oral q1800 Milagros LollSudini, Srikar, MD   80 mg at 01/29/19 2140  . cefTRIAXone (ROCEPHIN) 1 g in sodium chloride 0.9 % 100 mL IVPB  1 g Intravenous Q24H Oralia ManisWillis, David, MD 200 mL/hr at 01/30/19 0500    . citalopram (CELEXA) tablet 20 mg  20 mg Oral Daily Milagros LollSudini, Srikar, MD   20 mg at 01/29/19 1445  . divalproex (DEPAKOTE) DR tablet 250 mg  250 mg Oral TID Milagros LollSudini, Srikar, MD   250 mg at 01/29/19 2140  . lisinopril (ZESTRIL) tablet 20 mg  20 mg Oral Daily Milagros LollSudini, Srikar, MD   20 mg at 01/29/19 1445   And  .  hydrochlorothiazide (HYDRODIURIL) tablet 25 mg  25 mg Oral Daily Milagros Loll, MD   25 mg at 01/29/19 1445  . latanoprost (XALATAN) 0.005 % ophthalmic solution 1 drop  1 drop Both Eyes QHS Milagros Loll, MD   1 drop at 01/29/19 2146  . levothyroxine (SYNTHROID) tablet 100 mcg  100 mcg Oral Q0600 Milagros Loll, MD   100 mcg at 01/30/19 0549  . mirtazapine (REMERON) tablet 30 mg  30 mg Oral QHS Milagros Loll, MD   30 mg at 01/29/19 2140  . ondansetron (ZOFRAN) tablet 4 mg  4 mg Oral Q6H PRN Oralia Manis, MD       Or  . ondansetron [facility]) injection 4 mg  4 mg Intravenous Q6H PRN Oralia Manis, MD      . polyethylene glycol (MIRALAX / GLYCOLAX) packet 17 g  17 g Oral Daily Sudini, Srikar, MD      . tamsulosin  [facility]) capsule 0.4 mg  0.4 mg Oral Daily Milagros Loll, MD   0.4 mg at 01/29/19 1445     Discharge Medications: Please see discharge summary for a list of discharge medications.  Relevant Imaging Results:  Relevant Lab Results:   Additional Information    York Spaniel, LCSW

## 2019-01-30 NOTE — Discharge Summary (Signed)
[facility] [place] at [facility]   PATIENT NAME: Shawna [name]    MR#:  [id number]  DATE OF BIRTH:  30-Sep-1935  DATE OF ADMISSION:  01/28/2019 ADMITTING PHYSICIAN: Oralia Manis, MD  DATE OF DISCHARGE: 01/30/2019  PRIMARY CARE PHYSICIAN: Lauro Regulus, MD   ADMISSION DIAGNOSIS:  Gastrointestinal hemorrhage, unspecified gastrointestinal hemorrhage type [K92.2]  DISCHARGE DIAGNOSIS:  Principal Problem:   Hematochezia Active Problems:   Diastolic congestive heart failure (HCC)   AKI (acute kidney injury) (HCC)   Dyslipidemia   Hypothyroidism due to acquired atrophy of thyroid   Depression with anxiety   HTN (hypertension)   UTI (urinary tract infection)   SECONDARY DIAGNOSIS:   Past Medical History:  Diagnosis Date  . Cerebral infarction (HCC)   . CKD (chronic kidney disease), stage III (HCC)   . Depression   . DJD (degenerative joint disease)   . Edema   . High cholesterol   . Hyperlipidemia, unspecified   . Hypertension   . Hypothyroidism   . Malaise   . Morbid obesity (HCC)   . Osteoarthritis   . Parathyroid abnormality (HCC)   . Seizures (HCC)    a. following remote stroke.  . Stroke (HCC) 2002, 05/2017, 01/26/18, 01/30/18   a. 1964-->residual right arm wkns.  Previously on coumadin - pt says for just a few yrs.  . Subdural hematoma (HCC)    a. 10/2001 [facility] req temporal frontoparietal craniotomy following fall. ? whether or not pt on coumadin @ time.  Notes indicate yes but pt denies.  . Thyroid disease   . TMJ (dislocation of temporomandibular joint)   . Tuberculosis    a. ~ 1950  . Urinary incontinence   . Weight loss      ADMITTING HISTORY  HISTORY OF PRESENT ILLNESS:  Shawna [name]  is a 83 y.o. female who presents with chief complaint as above.  Patient presents the ED with a complaint of abdominal pain and rectal bleeding.  She states that this started today.  She states that she was recently diagnosed with a UTI and was just  started on antibiotics today.  However, with the bleeding today, which she had a couple of episodes at home she came to the ED for evaluation.  Her rectal exam was grossly positive for blood per ED physician.  Hospitalist were called for admission  HOSPITAL COURSE:   Hematochezia -hemoglobin stable and at baseline. On the of discharge hemoglobin is greater than 12.  GI has seen the patient.  Thought to be diverticular bleed.  No further investigations advised. Patient is on aspirin and Plavix which have been held.  With her history of stroke we will continue this but I have decreased her aspirin from 325 mg to 81 mg.  AKI (acute kidney injury) Likely due to UTI and dehydration.  Improved with IV fluids.    UTI (urinary tract infection) -IV Rocephin in the hospital. Changed to ceftin at discharge  Diastolic congestive heart failure  -continued home meds  HTN (hypertension) -home dose antihypertensives  Dyslipidemia -home dose antilipid  Hypothyroidism  Continue levothyroxine  Depression with anxiety -home dose antidepressant and anxiolytic.  Stable for discharge today  CONSULTS OBTAINED:  Treatment Team:  Toney Reil, MD  DRUG ALLERGIES:  No Known Allergies  DISCHARGE MEDICATIONS:   Allergies as of 01/30/2019   No Known Allergies     Medication List    STOP taking these medications   aspirin 325 MG tablet Replaced by:  aspirin EC 81 MG tablet     TAKE these medications   acetaminophen 325 MG tablet Commonly known as:  TYLENOL Take 650 mg by mouth every 4 (four) hours as needed for mild pain or moderate pain.   aspirin EC 81 MG tablet Take 1 tablet (81 mg total) by mouth daily. Replaces:  aspirin 325 MG tablet   atorvastatin 80 MG tablet Commonly known as:  LIPITOR Take 1 tablet (80 mg total) by mouth daily at 6 PM.   Biofreeze 4 % Gel Generic drug:  Menthol (Topical Analgesic) Apply 1 application topically 3 (three) times  daily.   cefUROXime 250 MG tablet Commonly known as:  CEFTIN Take 250 mg by mouth 2 (two) times daily with a meal.   citalopram 20 MG tablet Commonly known as:  CELEXA Take 20 mg by mouth daily.   cloNIDine 0.3 mg/24hr patch Commonly known as:  CATAPRES - Dosed in mg/24 hr Place 1 patch onto the skin every Friday.   clopidogrel 75 MG tablet Commonly known as:  PLAVIX Take 1 tablet (75 mg total) by mouth daily.   divalproex 250 MG DR tablet Commonly known as:  DEPAKOTE Take 250 mg by mouth 3 (three) times daily.   latanoprost 0.005 % ophthalmic solution Commonly known as:  XALATAN Place 1 drop into both eyes at bedtime.   levothyroxine 100 MCG tablet Commonly known as:  SYNTHROID Take 100 mcg by mouth daily.   lisinopril-hydrochlorothiazide 20-25 MG tablet Commonly known as:  ZESTORETIC Take 1 tablet by mouth daily.   mirtazapine 30 MG tablet Commonly known as:  REMERON Take 30 mg by mouth at bedtime.   polyethylene glycol 17 g packet Commonly known as:  MIRALAX / GLYCOLAX Take 17 g by mouth daily.   potassium chloride SA 20 MEQ tablet Commonly known as:  K-DUR Take 20 mEq by mouth daily as needed. Take with Lasix for edema   tamsulosin 0.4 MG Caps capsule Commonly known as:  Flomax Take 1 capsule (0.4 mg total) by mouth daily.   vitamin B-12 500 MCG tablet Commonly known as:  CYANOCOBALAMIN Take 500 mcg by mouth daily.       Today   VITAL SIGNS:  Blood pressure (!) 148/68, pulse 77, temperature 98.3 F (36.8 C), temperature source Oral, resp. rate 16, height  (1.626 m), weight 109.1 kg, SpO2 99 %.  I/O:    Intake/Output Summary (Last 24 hours) at 01/30/2019 0934 Last data filed at 01/30/2019 0547 Gross per 24 hour  Intake 1080.27 ml  Output 700 ml  Net 380.27 ml    PHYSICAL EXAMINATION:  Physical Exam  GENERAL:  83 y.o.-year-old patient lying in the bed with no acute distress.  LUNGS: Normal breath sounds bilaterally, no wheezing,  rales,rhonchi or crepitation. No use of accessory muscles of respiration.  CARDIOVASCULAR: S1, S2 normal. No murmurs, rubs, or gallops.  ABDOMEN: Soft, non-tender, non-distended. Bowel sounds present. No organomegaly or mass.  NEUROLOGIC: Moves all 4 extremities. PSYCHIATRIC: The patient is alert and oriented x 3.  SKIN: No obvious rash, lesion, or ulcer.   DATA REVIEW:   CBC Recent Labs  Lab 01/30/19 0559  WBC 8.2  HGB 12.8  HCT 39.8  PLT 161    Chemistries  Recent Labs  Lab 01/28/19 2104  01/30/19 0559  NA 137   < > 138  K 3.7   < > 3.6  CL 99   < > 99  CO2 27   < > 27  GLUCOSE 137*   < > 116*  BUN 26*   < > 15  CREATININE 1.30*   < > 0.82  CALCIUM 8.7*   < > 9.1  AST 24  --   --   ALT 24  --   --   ALKPHOS 80  --   --   BILITOT 0.6  --   --    < > = values in this interval not displayed.    Cardiac Enzymes No results for input(s): TROPONINI in the last 168 hours.  Microbiology Results  Results for orders placed or performed during the hospital encounter of 01/28/19  SARS Coronavirus 2 (CEPHEID - Performed in [facility] hospital lab), Hosp Order     Status: None   Collection Time: 01/28/19 11:09 PM  Result Value Ref Range Status   SARS Coronavirus 2 NEGATIVE NEGATIVE Final    Comment: (NOTE) If result is NEGATIVE SARS-CoV-2 target nucleic acids are NOT DETECTED. The SARS-CoV-2 RNA is generally detectable in upper and lower  respiratory specimens during the acute phase of infection. The lowest  concentration of SARS-CoV-2 viral copies this assay can detect is 250  copies / mL. A negative result does not preclude SARS-CoV-2 infection  and should not be used as the sole basis for treatment or other  patient management decisions.  A negative result may occur with  improper specimen collection / handling, submission of specimen other  than nasopharyngeal swab, presence of viral mutation(s) within the  areas targeted by this assay, and inadequate number of  viral copies  (<250 copies / mL). A negative result must be combined with clinical  observations, patient history, and epidemiological information. If result is POSITIVE SARS-CoV-2 target nucleic acids are DETECTED. The SARS-CoV-2 RNA is generally detectable in upper and lower  respiratory specimens dur ing the acute phase of infection.  Positive  results are indicative of active infection with SARS-CoV-2.  Clinical  correlation with patient history and other diagnostic information is  necessary to determine patient infection status.  Positive results do  not rule out bacterial infection or co-infection with other viruses. If result is PRESUMPTIVE POSTIVE SARS-CoV-2 nucleic acids MAY BE PRESENT.   A presumptive positive result was obtained on the submitted specimen  and confirmed on repeat testing.  While 2019 novel coronavirus  (SARS-CoV-2) nucleic acids may be present in the submitted sample  additional confirmatory testing may be necessary for epidemiological  and / or clinical management purposes  to differentiate between  SARS-CoV-2 and other Sarbecovirus currently known to infect humans.  If clinically indicated additional testing with an alternate test  methodology [phone]([id number]) is advised. The SARS-CoV-2 RNA is generally  detectable in upper and lower respiratory sp ecimens during the acute  phase of infection. The expected result is Negative. Fact Sheet for Patients:  BoilerBrush.com.cyhttps://www.fda.gov/media/136312/download Fact Sheet for Healthcare Providers: https://pope.com/https://www.fda.gov/media/136313/download This test is not yet approved or cleared by the [place] FDA and has been authorized for detection and/or diagnosis of SARS-CoV-2 by FDA under an Emergency Use Authorization (EUA).  This EUA will remain in effect (meaning this test can be used) for the duration of the COVID-19 declaration under Section 564(b)(1) of the Act, 21 U.S.C. section 360bbb-3(b)(1), unless the authorization is  terminated or revoked sooner. Performed at [facility], [address] [place], [place] [address]   MRSA PCR Screening     Status: None   Collection Time: 01/29/19  2:46 PM  Result Value Ref Range Status  MRSA by PCR NEGATIVE NEGATIVE Final    Comment:        The GeneXpert MRSA Assay (FDA approved for NASAL specimens only), is one component of a comprehensive MRSA colonization surveillance program. It is not intended to diagnose MRSA infection nor to guide or monitor treatment for MRSA infections. Performed at [facility], [address] [place], [place] [address]     RADIOLOGY:  Ct Abdomen Pelvis W Contrast  Result Date: 01/28/2019 CLINICAL DATA:  Abdominal pain and loose stools EXAM: CT ABDOMEN AND PELVIS WITH CONTRAST TECHNIQUE: Multidetector CT imaging of the abdomen and pelvis was performed using the standard protocol following bolus administration of intravenous contrast. CONTRAST:  OMNIPAQUE IOHEXOL 300 MG/ML  SOLN COMPARISON:  None. FINDINGS: LOWER CHEST: Bibasilar atelectasis. HEPATOBILIARY: The hepatic contours and density are normal. There is no intra- or extrahepatic biliary dilatation. The gallbladder is normal. PANCREAS: The pancreatic parenchymal contours are normal and there is no ductal dilatation. There is no peripancreatic fluid collection. SPLEEN: Normal. ADRENALS/URINARY TRACT: --Adrenal glands: Normal. --Right kidney/ureter: No hydronephrosis, nephroureterolithiasis, perinephric stranding or solid renal mass. --Left kidney/ureter: No hydronephrosis, nephroureterolithiasis, perinephric stranding or solid renal mass. --Urinary bladder: Mild urinary bladder wall thickening STOMACH/BOWEL: --Stomach/Duodenum: There is no hiatal hernia or other gastric abnormality. The duodenal course and caliber are normal. --Small bowel: No dilatation or inflammation. --Colon: No focal abnormality. --Appendix: Normal. VASCULAR/LYMPHATIC: There is aortic  atherosclerosis without hemodynamically significant stenosis. No abdominal or pelvic lymphadenopathy. REPRODUCTIVE: Status post hysterectomy. No adnexal mass. MUSCULOSKELETAL. There is grade 1 anterolisthesis at L4-5 secondary to bilateral facet hypertrophy. T12 compression fracture appears chronic. OTHER: None. IMPRESSION: 1. No acute abnormality of the abdomen or pelvis. 2. Urinary bladder wall thickening may be exaggerated by underdistention, or a sign of cystitis. 3. Mild calcific aortic atherosclerosis (ICD10-I70.0). 4. T12 compression fracture, most likely chronic. Electronically Signed   By: Deatra Robinson M.D.   On: 01/28/2019 23:16    Follow up with PCP in 1 week.  Management plans discussed with the patient, family and they are in agreement.  CODE STATUS:     Code Status Orders  (From admission, onward)         Start     Ordered   01/29/19 0326  Full code  Continuous     01/29/19 0325        Code Status History    Date Active Date Inactive Code Status Order ID Comments User Context   01/26/2018 1909 02/01/2018 2248 Full Code [id number]  Alford Highland, MD ED   06/10/2017 1103 06/15/2017 1746 Full Code [id number]  Milagros Loll, MD ED      TOTAL TIME TAKING CARE OF THIS PATIENT ON DAY OF DISCHARGE: more than 30 minutes.   Molinda Bailiff Ekin Pilar M.D on 01/30/2019 at 9:34 AM  Between 7am to 6pm - Pager - [phone]  After 6pm go to [email or web address] - password [email or web address] [facility]  [facility] [place] [facility]  Office  [phone]  CC: Primary care physician; Lauro Regulus, MD  Note: This dictation was prepared with Dragon dictation along with smaller phrase technology. Any transcriptional errors that result from this process are unintentional.

## 2019-01-30 NOTE — Discharge Instructions (Signed)
Resume diet and activity as before ° ° °

## 2019-01-30 NOTE — Progress Notes (Signed)
Pt left via EMS for [facility].

## 2019-01-30 NOTE — Progress Notes (Signed)
Report called to [place] at [place]. EMS called for transport.

## 2019-01-30 NOTE — TOC Transition Note (Signed)
Transition of Care [facility]) - CM/SW Discharge Note   Patient Details  Name: Shawna [name] MRN: [id number] Date of Birth: 20-Feb-1936  [facility] CM/SW Contact:  York Spaniel, LCSW Phone Number: 01/30/2019, 1:30 PM   Clinical Narrative:   Patient to discharge today to return to [facility] under long term care. CSW spoke with Ladona Ridgel at [place] and she is aware of the discharge and has received discharge information. CSW contacted patient's husband and informed him of the discharge. Patient's husband is in agreement with patient's return to [place].    Final next level of care: Skilled Nursing Facility Barriers to Discharge: No Barriers Identified   Patient Goals and CMS Choice        Discharge Placement   Existing PASRR number confirmed : 01/30/19            Patient to be transferred to facility by: (EMS) Name of family member notified: (Husband) Patient and family notified of of transfer: 01/30/19  Discharge Plan and Services                                     Social Determinants of Health (SDOH) Interventions     Readmission Risk Interventions Readmission Risk Prevention Plan 01/30/2019  Post Dischage Appt Complete  Medication Screening Complete  Transportation Screening Complete  Some recent data might be hidden

## 2019-01-31 DIAGNOSIS — K922 Gastrointestinal hemorrhage, unspecified: Secondary | ICD-10-CM | POA: Insufficient documentation

## 2019-01-31 HISTORY — DX: Gastrointestinal hemorrhage, unspecified: K92.2

## 2019-01-31 LAB — BPAM RBC
Blood Product Expiration Date: 202006252359
Blood Product Expiration Date: 202006252359
Unit Type and Rh: 5100
Unit Type and Rh: 5100

## 2019-01-31 LAB — TYPE AND SCREEN
ABO/RH(D): A POS
Antibody Screen: POSITIVE
Unit division: 0
Unit division: 0

## 2019-02-01 ENCOUNTER — Encounter: Payer: Self-pay | Admitting: Adult Health

## 2019-02-01 ENCOUNTER — Non-Acute Institutional Stay (SKILLED_NURSING_FACILITY): Payer: Medicare Other | Admitting: Adult Health

## 2019-02-01 DIAGNOSIS — E034 Atrophy of thyroid (acquired): Secondary | ICD-10-CM

## 2019-02-01 DIAGNOSIS — E785 Hyperlipidemia, unspecified: Secondary | ICD-10-CM | POA: Diagnosis not present

## 2019-02-01 DIAGNOSIS — F01518 Vascular dementia, unspecified severity, with other behavioral disturbance: Secondary | ICD-10-CM

## 2019-02-01 DIAGNOSIS — N390 Urinary tract infection, site not specified: Secondary | ICD-10-CM | POA: Diagnosis not present

## 2019-02-01 DIAGNOSIS — K921 Melena: Secondary | ICD-10-CM

## 2019-02-01 DIAGNOSIS — F418 Other specified anxiety disorders: Secondary | ICD-10-CM

## 2019-02-01 DIAGNOSIS — I5032 Chronic diastolic (congestive) heart failure: Secondary | ICD-10-CM

## 2019-02-01 DIAGNOSIS — Z8673 Personal history of transient ischemic attack (TIA), and cerebral infarction without residual deficits: Secondary | ICD-10-CM

## 2019-02-01 DIAGNOSIS — I110 Hypertensive heart disease with heart failure: Secondary | ICD-10-CM

## 2019-02-01 DIAGNOSIS — F0151 Vascular dementia with behavioral disturbance: Secondary | ICD-10-CM

## 2019-02-01 DIAGNOSIS — F39 Unspecified mood [affective] disorder: Secondary | ICD-10-CM

## 2019-02-01 NOTE — Progress Notes (Signed)
[facility]  [facility] Room Number: 169C Place of Service:  SNF ([phone]) Provider:  Kenard Gower, DNP, FNP-BC  Patient Care Team: Lauro Regulus, MD as PCP - General (Internal Medicine) Mariah Milling Tollie Pizza, MD as Consulting Physician (Cardiology) Sharee Holster, NP as Nurse Practitioner (Geriatric Medicine)  Extended Emergency Contact Information Primary Emergency Contact: Freda Munro Address: [address]          [place], [place] [address] [place] of [place] Home Phone: [phone] Mobile Phone: [phone] Relation: Spouse Secondary Emergency Contact: Burman Foster [place] Home Phone: [phone] Mobile Phone: [phone] Relation: Daughter  Code Status:  DNR  Goals of care: Advanced Directive information Advanced Directives 02/01/2019  Does Patient Have a Medical Advance Directive? No  Type of Advance Directive -  Does patient want to make changes to medical advance directive? No - Patient declined  Copy of Healthcare Power of Attorney in Chart? -  Would patient like information on creating a medical advance directive? -     Chief Complaint  Patient presents with  . Acute Visit    Hospital follow-up    HPI:  Pt is a 83 y.o. female seen today for an acute visit, hospital follow-up. She has PMH of cerebral infarction, hyperlipidemia, hypothyroidism and hypertension. She was recently re-admitted to the [facility] from a recent hospitalization due to hematochezia. Gi has seen patient and thought that it was due to diverticular bleed. No further investigations advised. Her ASA has been cut from 325 mg to 81 mg.  She was seen in her room today. She is smiling and even offered a chair. She denies having pain. She is alert to self, confused to time and place.         Past Medical History:  Diagnosis Date  . Cerebral infarction (HCC)   . CKD (chronic kidney disease), stage III (HCC)    . Depression   . DJD (degenerative joint disease)   . Edema   . High cholesterol   . Hyperlipidemia, unspecified   . Hypertension   . Hypothyroidism   . Malaise   . Morbid obesity (HCC)   . Osteoarthritis   . Parathyroid abnormality (HCC)   . Seizures (HCC)    a. following remote stroke.  . Stroke (HCC) 2002, 05/2017, 01/26/18, 01/30/18   a. 1964-->residual right arm wkns.  Previously on coumadin - pt says for just a few yrs.  . Subdural hematoma (HCC)    a. 10/2001 [facility] req temporal frontoparietal craniotomy following fall. ? whether or not pt on coumadin @ time.  Notes indicate yes but pt denies.  . Thyroid disease   . TMJ (dislocation of temporomandibular joint)   . Tuberculosis    a. ~ 1950  . Urinary incontinence   . Weight loss    Past Surgical History:  Procedure Laterality Date  . ABDOMINAL HYSTERECTOMY    . BACK SURGERY    . BRAIN SURGERY    . cataract surgery     2014  . LEFT HEART CATH AND CORONARY ANGIOGRAPHY N/A 06/13/2017   Procedure: LEFT HEART CATH AND CORONARY ANGIOGRAPHY;  Surgeon: Iran Ouch, MD;  Location: [facility] INVASIVE CV LAB;  Service: Cardiovascular;  Laterality: N/A;  . PARATHYROID ADENOMA REMOVAL    . TEE WITHOUT CARDIOVERSION N/A 01/31/2018   Procedure: TRANSESOPHAGEAL ECHOCARDIOGRAM (TEE);  Surgeon: Yvonne Kendall, MD;  Location: [facility] ORS;  Service: Cardiovascular;  Laterality: N/A;  . TEE WITHOUT CARDIOVERSION  N/A 02/01/2018   Procedure: TRANSESOPHAGEAL ECHOCARDIOGRAM (TEE);  Surgeon: Antonieta IbaGollan, Timothy J, MD;  Location: [facility] ORS;  Service: Cardiovascular;  Laterality: N/A;    No Known Allergies  Outpatient Encounter Medications as of 02/01/2019  Medication Sig  . aspirin EC 81 MG tablet Take 1 tablet (81 mg total) by mouth daily.  Marland Kitchen. atorvastatin (LIPITOR) 80 MG tablet Take 1 tablet (80 mg total) by mouth daily at 6 PM.  . cefUROXime (CEFTIN) 250 MG tablet Take 250 mg by mouth 2 (two) times daily with a meal.  . citalopram (CELEXA) 20 MG tablet  Take 20 mg by mouth daily.   . cloNIDine (CATAPRES - DOSED IN MG/24 HR) 0.3 mg/24hr patch Place 1 patch onto the skin every Friday.   . clopidogrel (PLAVIX) 75 MG tablet Take 1 tablet (75 mg total) by mouth daily.  . divalproex (DEPAKOTE) 250 MG DR tablet Take 250 mg by mouth 3 (three) times daily.  Marland Kitchen. latanoprost (XALATAN) 0.005 % ophthalmic solution Place 1 drop into both eyes at bedtime.   Marland Kitchen. levothyroxine (SYNTHROID) 100 MCG tablet Take 100 mcg by mouth daily.  Marland Kitchen. lisinopril-hydrochlorothiazide (PRINZIDE,ZESTORETIC) 20-25 MG tablet Take 1 tablet by mouth daily.   . Menthol, Topical Analgesic, (BIOFREEZE) 4 % GEL Apply 1 application topically 3 (three) times daily.  . mirtazapine (REMERON) 30 MG tablet Take 30 mg by mouth at bedtime.  . polyethylene glycol (MIRALAX / GLYCOLAX) packet Take 17 g by mouth daily.   . tamsulosin (FLOMAX) 0.4 MG CAPS capsule Take 1 capsule (0.4 mg total) by mouth daily.  . vitamin B-12 (CYANOCOBALAMIN) 500 MCG tablet Take 500 mcg by mouth daily.  Marland Kitchen. acetaminophen (TYLENOL) 325 MG tablet Take 650 mg by mouth every 4 (four) hours as needed for mild pain or moderate pain.   . [DISCONTINUED] potassium chloride SA (K-DUR,KLOR-CON) 20 MEQ tablet Take 20 mEq by mouth daily as needed. Take with Lasix for edema   No facility-administered encounter medications on file as of 02/01/2019.     Review of Systems  Unable to obtain due to vascular dementia    Immunization History  Administered Date(s) Administered  . Influenza Split 07/18/2014, 07/23/2015  . Influenza, High Dose Seasonal PF 06/14/2017  . Influenza-Unspecified 05/25/2012, 05/31/2016, 06/01/2018  . Pneumococcal Polysaccharide-23 08/30/2004, 08/04/2017   Pertinent  Health Maintenance Due  Topic Date Due  . PNA vac Low Risk Adult (2 of 2 - PCV13) 08/04/2018  . INFLUENZA VACCINE  03/31/2019  . DEXA SCAN  Discontinued   No flowsheet data found.   Vitals:   02/01/19 0956  BP: (!) 152/70  Pulse: 77  Resp: 18   Temp: 97.6 F (36.4 C)  TempSrc: Oral  SpO2: 97%  Weight: 233 lb 11.2 oz (106 kg)  Height: 5\' 4"  (1.626 m)   Body mass index is 40.11 kg/m.  Physical Exam  GENERAL APPEARANCE: Well nourished. In no acute distress. Morbidly obese. SKIN:  Skin is warm and dry.  MOUTH and THROAT: Lips are without lesions. Oral mucosa is moist and without lesions.  RESPIRATORY: Breathing is even & unlabored, BS CTAB CARDIAC: RRR, no murmur,no extra heart sounds, no edema GI: Abdomen soft, normal BS, no masses, no tendernessNEUROLOGICAL: There is no tremor. Speech is clear. Alert to self, disoriented to time and place. PSYCHIATRIC:  Affect and behavior are appropriate  Labs reviewed: Recent Labs    01/28/19 2104 01/29/19 0550 01/30/19 0559  NA 137 137 138  K 3.7 3.8 3.6  CL 99 98  99  CO2 GLUCOSE 137* 101* 116*  BUN 26* 25* 15  CREATININE 1.30* 1.09* 0.82  CALCIUM 8.7* 8.4* 9.1   Recent Labs    10/04/18 0600 12/29/18 0610 01/28/19 2104  AST ALT ALKPHOS 62 79 80  BILITOT 0.4 0.6 0.6  PROT 5.7* 6.3* 6.4*  ALBUMIN 3.0* 3.0* 3.1*   Recent Labs    10/04/18 0600 12/29/18 0610 01/28/19 2104 01/29/19 0550 01/30/19 0559  WBC 6.2 7.1 8.9 7.2 8.2  NEUTROABS 3.2 4.3  --   --  5.5  HGB 11.3* 11.5* 13.0 11.5* 12.8  HCT 35.4* 35.7* 39.7 34.8* 39.8  MCV 94.4 94.2 92.1 92.3 93.6  PLT 143* 163 165 147* 161   Lab Results  Component Value Date   TSH 5.838 (H) 12/29/2018   Lab Results  Component Value Date   HGBA1C 5.6 01/27/2018   Lab Results  Component Value Date   CHOL 149 01/28/2018   HDL 40 (L) 01/28/2018   LDLCALC 81 01/28/2018   TRIG 139 01/28/2018   CHOLHDL 3.7 01/28/2018    Significant Diagnostic Results in last 30 days:  Ct Abdomen Pelvis W Contrast  Result Date: 01/28/2019 CLINICAL DATA:  Abdominal pain and loose stools EXAM: CT ABDOMEN AND PELVIS WITH CONTRAST TECHNIQUE: Multidetector CT imaging of the abdomen and pelvis was performed  using the standard protocol following bolus administration of intravenous contrast. CONTRAST:  OMNIPAQUE IOHEXOL 300 MG/ML  SOLN COMPARISON:  None. FINDINGS: LOWER CHEST: Bibasilar atelectasis. HEPATOBILIARY: The hepatic contours and density are normal. There is no intra- or extrahepatic biliary dilatation. The gallbladder is normal. PANCREAS: The pancreatic parenchymal contours are normal and there is no ductal dilatation. There is no peripancreatic fluid collection. SPLEEN: Normal. ADRENALS/URINARY TRACT: --Adrenal glands: Normal. --Right kidney/ureter: No hydronephrosis, nephroureterolithiasis, perinephric stranding or solid renal mass. --Left kidney/ureter: No hydronephrosis, nephroureterolithiasis, perinephric stranding or solid renal mass. --Urinary bladder: Mild urinary bladder wall thickening STOMACH/BOWEL: --Stomach/Duodenum: There is no hiatal hernia or other gastric abnormality. The duodenal course and caliber are normal. --Small bowel: No dilatation or inflammation. --Colon: No focal abnormality. --Appendix: Normal. VASCULAR/LYMPHATIC: There is aortic atherosclerosis without hemodynamically significant stenosis. No abdominal or pelvic lymphadenopathy. REPRODUCTIVE: Status post hysterectomy. No adnexal mass. MUSCULOSKELETAL. There is grade 1 anterolisthesis at L4-5 secondary to bilateral facet hypertrophy. T12 compression fracture appears chronic. OTHER: None. IMPRESSION: 1. No acute abnormality of the abdomen or pelvis. 2. Urinary bladder wall thickening may be exaggerated by underdistention, or a sign of cystitis. 3. Mild calcific aortic atherosclerosis (ICD10-I70.0). 4. T12 compression fracture, most likely chronic. Electronically Signed   By: Deatra Robinson M.D.   On: 01/28/2019 23:16    Assessment/Plan  1. Hematochezia -  thought to be a diverticular bleed, GI did not recommend any further evaluation and decreased ASA from 325 mg to 81 mg daily  2. Urinary tract infection without  hematuria, site unspecified -she was given Rocephin in the hospital and changed to Rocephin at discharge.  3. Dyslipidemia -Continue atorvastatin 80 mg 1 tab daily Lab Results  Component Value Date   CHOL 149 01/28/2018   HDL 40 (L) 01/28/2018   LDLCALC 81 01/28/2018   TRIG 139 01/28/2018   CHOLHDL 3.7 01/28/2018   4. Depression with anxiety -Continue mirtazapine 30 mg 1 tab at bedtime and Celexa 20 mg 1 tab daily  5. Hypertensive heart disease with chronic diastolic congestive heart failure (HCC) -Continue clonidine 0.3 mg /  24-hour transdermal on Fridays, lisinopril- hydrochlorothiazide 20-25 mg 1 tab daily  6. History of CVA (cerebrovascular accident) -Stable, will continue Plavix 75 mg 1 tab daily and aspirin 81 mg daily, clonidine 0.3 mg / 24-hour transdermal on Fridays, lisinopril- hydrochlorothiazide 20-25 mg 1 tab daily  7. Hypothyroidism due to acquired atrophy of thyroid Lab Results  Component Value Date   TSH 5.838 (H) 12/29/2018  -Continue levothyroxine 100 mcg 1 tab daily  8. Vascular dementia with behavior disturbance (HCC) -Continue supportive care, fall precautions  9.  Mood disorder -Stable, continue Depakote DR 250 mg 1 tab 3 times daily   Family/ staff Communication: Discussed plan of care with resident and charge nurse.  Labs/tests ordered: None  Goals of care: Long-term care   Kenard Gower, DNP, FNP-BC [facility] [phone] (Monday-Friday 8:00 a.m. - 5:00 p.m.) [phone] (after hours)

## 2019-02-05 ENCOUNTER — Encounter: Payer: Self-pay | Admitting: Adult Health

## 2019-02-05 ENCOUNTER — Non-Acute Institutional Stay (SKILLED_NURSING_FACILITY): Payer: Medicare Other | Admitting: Adult Health

## 2019-02-05 DIAGNOSIS — K921 Melena: Secondary | ICD-10-CM | POA: Diagnosis not present

## 2019-02-05 DIAGNOSIS — M19012 Primary osteoarthritis, left shoulder: Secondary | ICD-10-CM

## 2019-02-05 DIAGNOSIS — E785 Hyperlipidemia, unspecified: Secondary | ICD-10-CM

## 2019-02-05 DIAGNOSIS — E034 Atrophy of thyroid (acquired): Secondary | ICD-10-CM

## 2019-02-05 DIAGNOSIS — F329 Major depressive disorder, single episode, unspecified: Secondary | ICD-10-CM

## 2019-02-05 DIAGNOSIS — R339 Retention of urine, unspecified: Secondary | ICD-10-CM | POA: Diagnosis not present

## 2019-02-05 DIAGNOSIS — Z8673 Personal history of transient ischemic attack (TIA), and cerebral infarction without residual deficits: Secondary | ICD-10-CM

## 2019-02-05 DIAGNOSIS — I10 Essential (primary) hypertension: Secondary | ICD-10-CM | POA: Diagnosis not present

## 2019-02-05 DIAGNOSIS — F0151 Vascular dementia with behavioral disturbance: Secondary | ICD-10-CM

## 2019-02-05 DIAGNOSIS — F01518 Vascular dementia, unspecified severity, with other behavioral disturbance: Secondary | ICD-10-CM

## 2019-02-05 DIAGNOSIS — F39 Unspecified mood [affective] disorder: Secondary | ICD-10-CM

## 2019-02-05 NOTE — Progress Notes (Signed)
Location:  [facility] Room Number: 311-A Place of Service:  SNF [phone]) Provider:  Kenard GowerMedina-Vargas, Rhone Ozaki, DNP, FNP-BC  Patient Care Team: Lauro RegulusAnderson, Marshall W, MD as PCP - General (Internal Medicine) Mariah MillingGollan, Tollie Pizzaimothy J, MD as Consulting Physician (Cardiology) Sharee HolsterGreen, Deborah S, NP as Nurse Practitioner (Geriatric Medicine)  Extended Emergency Contact Information Primary Emergency Contact: Freda MunroJeffries,James W Address: [address]          [place], [place] [address] [place] Home Phone: [phone] Mobile Phone: [phone] Relation: Spouse Secondary Emergency Contact: Burman FosterJeffries,[name]  [place] Home Phone: [phone] Mobile Phone: [phone] Relation: Daughter  Code Status:  Full Code  Goals of care: Advanced Directive information Advanced Directives 02/01/2019  Does Patient Have a Medical Advance Directive? No  Type of Advance Directive -  Does patient want to make changes to medical advance directive? No - Patient declined  Copy of Healthcare Power of Attorney in Chart? -  Would patient like information on creating a medical advance directive? -     Chief Complaint  Patient presents with  . Medical Management of Chronic Issues    Routine [facility] visit    HPI:  Pt is an 83 y.o. female seen today for medical management of chronic diseases.  She is a long-term care resident of [facility]. She has a PMH of cerebral infarction, HLD, hypothyroidism, and hypertension.  She was seen in her room today. She was happy while talking. She denies having pain on her left shoulder. She has Biofreeze 4% gel to left shoulder applied to left shoulder..   Past Medical History:  Diagnosis Date  . Cerebral infarction (HCC)   . CKD (chronic kidney disease), stage III (HCC)   . Depression   . DJD (degenerative joint disease)   . Edema   . High cholesterol   . Hyperlipidemia, unspecified   . Hypertension   . Hypothyroidism   .  Malaise   . Morbid obesity (HCC)   . Osteoarthritis   . Parathyroid abnormality (HCC)   . Seizures (HCC)    a. following remote stroke.  . Stroke (HCC) 2002, 05/2017, 01/26/18, 01/30/18   a. 1964-->residual right arm wkns.  Previously on coumadin - pt says for just a few yrs.  . Subdural hematoma (HCC)    a. 10/2001 [facility] req temporal frontoparietal craniotomy following fall. ? whether or not pt on coumadin @ time.  Notes indicate yes but pt denies.  . Thyroid disease   . TMJ (dislocation of temporomandibular joint)   . Tuberculosis    a. ~ 1950  . Urinary incontinence   . Weight loss    Past Surgical History:  Procedure Laterality Date  . ABDOMINAL HYSTERECTOMY    . BACK SURGERY    . BRAIN SURGERY    . cataract surgery     2014  . LEFT HEART CATH AND CORONARY ANGIOGRAPHY N/A 06/13/2017   Procedure: LEFT HEART CATH AND CORONARY ANGIOGRAPHY;  Surgeon: Iran OuchArida, Muhammad A, MD;  Location: [facility] INVASIVE CV LAB;  Service: Cardiovascular;  Laterality: N/A;  . PARATHYROID ADENOMA REMOVAL    . TEE WITHOUT CARDIOVERSION N/A 01/31/2018   Procedure: TRANSESOPHAGEAL ECHOCARDIOGRAM (TEE);  Surgeon: Yvonne KendallEnd, Christopher, MD;  Location: [facility] ORS;  Service: Cardiovascular;  Laterality: N/A;  . TEE WITHOUT CARDIOVERSION N/A 02/01/2018   Procedure: TRANSESOPHAGEAL ECHOCARDIOGRAM (TEE);  Surgeon: Antonieta IbaGollan, Timothy J, MD;  Location: [facility] ORS;  Service: Cardiovascular;  Laterality: N/A;    No Known Allergies  Outpatient  Encounter Medications as of 02/05/2019  Medication Sig  . aspirin EC 81 MG tablet Take 1 tablet (81 mg total) by mouth daily.  Marland Kitchen. atorvastatin (LIPITOR) 80 MG tablet Take 1 tablet (80 mg total) by mouth daily at 6 PM.  . citalopram (CELEXA) 20 MG tablet Take 20 mg by mouth daily.   . cloNIDine (CATAPRES - DOSED IN MG/24 HR) 0.3 mg/24hr patch Place 1 patch onto the skin every Friday.   . clopidogrel (PLAVIX) 75 MG tablet Take 1 tablet (75 mg total) by mouth daily.  . divalproex (DEPAKOTE) 250 MG DR  tablet Take 250 mg by mouth 3 (three) times daily.  Marland Kitchen. latanoprost (XALATAN) 0.005 % ophthalmic solution Place 1 drop into both eyes at bedtime.   Marland Kitchen. levothyroxine (SYNTHROID) 100 MCG tablet Take 100 mcg by mouth daily.  Marland Kitchen. lisinopril-hydrochlorothiazide (PRINZIDE,ZESTORETIC) 20-25 MG tablet Take 1 tablet by mouth daily.   . Menthol, Topical Analgesic, (BIOFREEZE) 4 % GEL Apply 1 application topically 3 (three) times daily.  . mirtazapine (REMERON) 30 MG tablet Take 30 mg by mouth at bedtime.  . polyethylene glycol (MIRALAX / GLYCOLAX) packet Take 17 g by mouth daily.   . tamsulosin (FLOMAX) 0.4 MG CAPS capsule Take 1 capsule (0.4 mg total) by mouth daily.  . vitamin B-12 (CYANOCOBALAMIN) 500 MCG tablet Take 500 mcg by mouth daily.  . [DISCONTINUED] acetaminophen (TYLENOL) 325 MG tablet Take 650 mg by mouth every 4 (four) hours as needed for mild pain or moderate pain.    No facility-administered encounter medications on file as of 02/05/2019.     Review of Systems  Unable to obtain due to vascular dementia    Immunization History  Administered Date(s) Administered  . Influenza Split 07/18/2014, 07/23/2015  . Influenza, High Dose Seasonal PF 06/14/2017  . Influenza-Unspecified 05/25/2012, 05/31/2016, 06/01/2018  . Pneumococcal Polysaccharide-23 08/30/2004, 08/04/2017   Pertinent  Health Maintenance Due  Topic Date Due  . PNA vac Low Risk Adult (2 of 2 - PCV13) 08/04/2018  . INFLUENZA VACCINE  03/31/2019  . DEXA SCAN  Discontinued    Vitals:   02/05/19 1041  BP: (!) 147/77  Pulse: 82  Resp: 20  Temp: 98.3 F (36.8 C)  TempSrc: Oral  SpO2: 94%  Weight: 234 lb 9.6 oz (106.4 kg)  Height: 5\' 4"  (1.626 m)   Body mass index is 40.27 kg/m.  Physical Exam  GENERAL APPEARANCE: Well nourished. In no acute distress. Morbidly obese SKIN:  Skin is warm and dry.  MOUTH and THROAT: Lips are without lesions. Oral mucosa is moist and without lesions.  RESPIRATORY: Breathing is even &  unlabored, BS CTAB CARDIAC: RRR, no murmur,no extra heart sounds, BLE trace edema GI: Abdomen soft, normal BS, no masses, no tenderness EXTREMITIES:  Able to move X 4 extremities,  NEUROLOGICAL: There is no tremor. Speech is clear. Alert to self, disoriented to time and place.  PSYCHIATRIC:  Affect and behavior are appropriate  Labs reviewed: Recent Labs    01/28/19 2104 01/29/19 0550 01/30/19 0559  NA 137 137 138  K 3.7 3.8 3.6  CL 99 98 99  CO2 27 29 27   GLUCOSE 137* 101* 116*  BUN 26* 25* 15  CREATININE 1.30* 1.09* 0.82  CALCIUM 8.7* 8.4* 9.1   Recent Labs    10/04/18 0600 12/29/18 0610 01/28/19 2104  AST 18 28 24   ALT 16 27 24   ALKPHOS 62 79 80  BILITOT 0.4 0.6 0.6  PROT 5.7* 6.3* 6.4*  ALBUMIN  3.0* 3.0* 3.1*   Recent Labs    10/04/18 0600 12/29/18 0610 01/28/19 2104 01/29/19 0550 01/30/19 0559  WBC 6.2 7.1 8.9 7.2 8.2  NEUTROABS 3.2 4.3  --   --  5.5  HGB 11.3* 11.5* 13.0 11.5* 12.8  HCT 35.4* 35.7* 39.7 34.8* 39.8  MCV 94.4 94.2 92.1 92.3 93.6  PLT 143* 163 165 147* 161   Lab Results  Component Value Date   TSH 5.838 (H) 12/29/2018   Lab Results  Component Value Date   HGBA1C 5.6 01/27/2018   Lab Results  Component Value Date   CHOL 149 01/28/2018   HDL 40 (L) 01/28/2018   LDLCALC 81 01/28/2018   TRIG 139 01/28/2018   CHOLHDL 3.7 01/28/2018    Significant Diagnostic Results in last 30 days:  Ct Abdomen Pelvis W Contrast  Result Date: 01/28/2019 CLINICAL DATA:  Abdominal pain and loose stools EXAM: CT ABDOMEN AND PELVIS WITH CONTRAST TECHNIQUE: Multidetector CT imaging of the abdomen and pelvis was performed using the standard protocol following bolus administration of intravenous contrast. CONTRAST:  178mL OMNIPAQUE IOHEXOL 300 MG/ML  SOLN COMPARISON:  None. FINDINGS: LOWER CHEST: Bibasilar atelectasis. HEPATOBILIARY: The hepatic contours and density are normal. There is no intra- or extrahepatic biliary dilatation. The gallbladder is normal.  PANCREAS: The pancreatic parenchymal contours are normal and there is no ductal dilatation. There is no peripancreatic fluid collection. SPLEEN: Normal. ADRENALS/URINARY TRACT: --Adrenal glands: Normal. --Right kidney/ureter: No hydronephrosis, nephroureterolithiasis, perinephric stranding or solid renal mass. --Left kidney/ureter: No hydronephrosis, nephroureterolithiasis, perinephric stranding or solid renal mass. --Urinary bladder: Mild urinary bladder wall thickening STOMACH/BOWEL: --Stomach/Duodenum: There is no hiatal hernia or other gastric abnormality. The duodenal course and caliber are normal. --Small bowel: No dilatation or inflammation. --Colon: No focal abnormality. --Appendix: Normal. VASCULAR/LYMPHATIC: There is aortic atherosclerosis without hemodynamically significant stenosis. No abdominal or pelvic lymphadenopathy. REPRODUCTIVE: Status post hysterectomy. No adnexal mass. MUSCULOSKELETAL. There is grade 1 anterolisthesis at L4-5 secondary to bilateral facet hypertrophy. T12 compression fracture appears chronic. OTHER: None. IMPRESSION: 1. No acute abnormality of the abdomen or pelvis. 2. Urinary bladder wall thickening may be exaggerated by underdistention, or a sign of cystitis. 3. Mild calcific aortic atherosclerosis (ICD10-I70.0). 4. T12 compression fracture, most likely chronic. Electronically Signed   By: Ulyses Jarred M.D.   On: 01/28/2019 23:16    Assessment/Plan  1. Hematochezia -Thought to be due to diverticular bleed, no bleeding reported for the past week  2. Chronic retention of urine -Stable, continue tamsulosin 0.4 mg 1 capsule daily  3. Hypothyroidism due to acquired atrophy of thyroid Lab Results  Component Value Date   TSH 5.838 (H) 12/29/2018  -Continue levothyroxine 100 mcg 1 tab daily  4. Essential hypertension -Stable, continue clonidine 0.3 mg/24-hour 1 patch transdermal q. Fridays, lisinopril-hydrochlorothiazide 20-25 mg 1 tab daily,  5. History of CVA  (cerebrovascular accident) -Stable, continue Plavix 75 mg 1 tab daily aspirin 81 mg 1 tab daily, atorvastatin 80 mg 1 tab daily, clonidine 0.3 mg/24-hour 1 patch transdermal q. Fridays, lisinopril-hydrochlorothiazide 20-25 mg 1 tab daily,  6. Major depression, chronic -Stable, continue Celexa 20 mg 1 tab daily and mirtazapine 30 mg 1 tab at bedtime  7. Mood disorder (HCC) -Stable, continue Depakote DR 250 mg 1 tab 3 times a day  8. Primary osteoarthritis of left shoulder -Denies pain on left shoulder, continue Biofreeze 4% gel topically to left shoulder 3 times a day  9. Dyslipidemia Lab Results  Component Value Date   CHOL 149  01/28/2018   HDL 40 (L) 01/28/2018   LDLCALC 81 01/28/2018   TRIG 139 01/28/2018   CHOLHDL 3.7 01/28/2018  -Continue atorvastatin 80 mg 1 tab daily  10. Vascular dementia with behavior disturbance (HCC) -Continue supportive care, fall precautions    Family/ staff Communication: Discussed plan of care with resident.  Labs/tests ordered: None  Goals of care:   Long-term care.   Kenard GowerMonina Medina-Vargas, DNP, FNP-BC [facility] [phone] (Monday-Friday 8:00 a.m. - 5:00 p.m.) [phone] (after hours)

## 2019-02-12 ENCOUNTER — Non-Acute Institutional Stay (SKILLED_NURSING_FACILITY): Payer: Medicare Other | Admitting: Adult Health

## 2019-02-12 ENCOUNTER — Encounter: Payer: Self-pay | Admitting: Adult Health

## 2019-02-12 DIAGNOSIS — R339 Retention of urine, unspecified: Secondary | ICD-10-CM

## 2019-02-12 DIAGNOSIS — F39 Unspecified mood [affective] disorder: Secondary | ICD-10-CM | POA: Insufficient documentation

## 2019-02-12 DIAGNOSIS — I10 Essential (primary) hypertension: Secondary | ICD-10-CM | POA: Diagnosis not present

## 2019-02-12 DIAGNOSIS — F339 Major depressive disorder, recurrent, unspecified: Secondary | ICD-10-CM

## 2019-02-12 DIAGNOSIS — Z8673 Personal history of transient ischemic attack (TIA), and cerebral infarction without residual deficits: Secondary | ICD-10-CM | POA: Diagnosis not present

## 2019-02-12 DIAGNOSIS — E034 Atrophy of thyroid (acquired): Secondary | ICD-10-CM | POA: Diagnosis not present

## 2019-02-12 NOTE — Progress Notes (Addendum)
Location:  [facility] Room Number: 694-W Place of Service:  SNF ([phone]) Provider:  Durenda Age, DNP, FNP-BC  Patient Care Team: Kirk Ruths, MD as PCP - General (Internal Medicine) Rockey Situ Kathlene November, MD as Consulting Physician (Cardiology) Gerlene Fee, NP as Nurse Practitioner (Geriatric Medicine)  Extended Emergency Contact Information Primary Emergency Contact: Eric Form Address: [address]          [place], [place] [address] Johnnette Litter of [place] Phone: [phone] Mobile Phone: [phone] Relation: Spouse Secondary Emergency Contact: Barron Schmid [place] of [place] Phone: [phone] Mobile Phone: [phone] Relation: Daughter  Code Status:  Full Code  Goals of care: Advanced Directive information Advanced Directives 02/01/2019  Does Patient Have a Medical Advance Directive? No  Type of Advance Directive -  Does patient want to make changes to medical advance directive? No - Patient declined  Copy of [place] in Chart? -  Would patient like information on creating a medical advance directive? -     Chief Complaint  Patient presents with  . Discharge Note    Patient to discharge to [facility]    HPI:  Pt is an 83 y.o. female seen today for discharge.  She is to discharge to [facility] on 02/14/19 for continued long-term care.    She has been admitted to [facility]18. She was recently hospitalized 01/28/19 to 01/30/19 due to hematochezia which was thought to be due to diverticular bleed. GI did not recommend further investigation.  She has history of stroke and aspirin was decreased from 325 mg to 81 mg and continued Plavix.   Past Medical History:  Diagnosis Date  . Cerebral infarction ([place])   . CKD (chronic kidney disease), stage III ([place])   . Depression   . DJD (degenerative joint disease)   . Edema   . High  cholesterol   . Hyperlipidemia, unspecified   . Hypertension   . Hypothyroidism   . Malaise   . Morbid obesity ([place])   . Osteoarthritis   . Parathyroid abnormality ([place])   . Seizures ([place])    a. following remote stroke.  . Stroke ([place]) 2002, 05/2017, 01/26/18, 01/30/18   a. 1964-->residual right arm wkns.  Previously on coumadin - pt says for just a few yrs.  . Subdural hematoma ([place])    a. 10/2001 [facility] req temporal frontoparietal craniotomy following fall. ? whether or not pt on coumadin @ time.  Notes indicate yes but pt denies.  . Thyroid disease   . TMJ (dislocation of temporomandibular joint)   . Tuberculosis    a. ~ 1950  . Urinary incontinence   . Weight loss    Past Surgical History:  Procedure Laterality Date  . ABDOMINAL HYSTERECTOMY    . BACK SURGERY    . BRAIN SURGERY    . cataract surgery     2014  . LEFT HEART CATH AND CORONARY ANGIOGRAPHY N/A 06/13/2017   Procedure: LEFT HEART CATH AND CORONARY ANGIOGRAPHY;  Surgeon: Wellington Hampshire, MD;  Location: [facility];  Service: Cardiovascular;  Laterality: N/A;  . PARATHYROID ADENOMA REMOVAL    . TEE WITHOUT CARDIOVERSION N/A 01/31/2018   Procedure: TRANSESOPHAGEAL ECHOCARDIOGRAM (TEE);  Surgeon: Nelva Bush, MD;  Location: [facility] ORS;  Service: Cardiovascular;  Laterality: N/A;  . TEE WITHOUT CARDIOVERSION N/A 02/01/2018   Procedure: TRANSESOPHAGEAL ECHOCARDIOGRAM (TEE);  Surgeon: Minna Merritts, MD;  Location: [facility] ORS;  Service: Cardiovascular;  Laterality: N/A;    No Known Allergies  Outpatient Encounter Medications as of 02/12/2019  Medication Sig  . aspirin EC 81 MG tablet Take 1 tablet (81 mg total) by mouth daily.  Marland Kitchen. atorvastatin (LIPITOR) 80 MG tablet Take 1 tablet (80 mg total) by mouth daily at 6 PM.  . citalopram (CELEXA) 20 MG tablet Take 20 mg by mouth daily.   . cloNIDine (CATAPRES - DOSED IN MG/24 HR) 0.3 mg/24hr patch Place 1 patch onto the skin every Friday.   . clopidogrel (PLAVIX) 75 MG  tablet Take 1 tablet (75 mg total) by mouth daily.  . divalproex (DEPAKOTE) 250 MG DR tablet Take 250 mg by mouth 3 (three) times daily.  Marland Kitchen. latanoprost (XALATAN) 0.005 % ophthalmic solution Place 1 drop into both eyes at bedtime.   Marland Kitchen. levothyroxine (SYNTHROID) 100 MCG tablet Take 100 mcg by mouth daily.  Marland Kitchen. lisinopril-hydrochlorothiazide (PRINZIDE,ZESTORETIC) 20-25 MG tablet Take 1 tablet by mouth daily.   . Menthol, Topical Analgesic, (BIOFREEZE) 4 % GEL Apply 1 application topically 3 (three) times daily.  . mirtazapine (REMERON) 30 MG tablet Take 30 mg by mouth at bedtime.  . polyethylene glycol (MIRALAX / GLYCOLAX) packet Take 17 g by mouth daily.   . tamsulosin (FLOMAX) 0.4 MG CAPS capsule Take 1 capsule (0.4 mg total) by mouth daily.  . vitamin B-12 (CYANOCOBALAMIN) 500 MCG tablet Take 500 mcg by mouth daily.   No facility-administered encounter medications on file as of 02/12/2019.     Review of Systems  Unable to obtain due to vascular dementia     Immunization History  Administered Date(s) Administered  . Influenza Split 07/18/2014, 07/23/2015  . Influenza, High Dose Seasonal PF 06/14/2017  . Influenza-Unspecified 05/25/2012, 05/31/2016, 06/01/2018  . Pneumococcal Polysaccharide-23 08/30/2004, 08/04/2017   Pertinent  Health Maintenance Due  Topic Date Due  . PNA vac Low Risk Adult (2 of 2 - PCV13) 08/04/2018  . INFLUENZA VACCINE  03/31/2019  . DEXA SCAN  Discontinued    Vitals:   02/12/19 1137  BP: (!) 147/77  Pulse: 82  Resp: 20  Temp: (!) 97.4 F (36.3 C)  TempSrc: Oral  SpO2: 94%  Weight: 230 lb 6.4 oz (104.5 kg)  Height: 5\' 4"  (1.626 m)   Body mass index is 39.55 kg/m.  Physical Exam  GENERAL APPEARANCE: Well nourished. In no acute distress. Obese SKIN:  Skin is warm and dry.  MOUTH and THROAT: Lips are without lesions. Oral mucosa is moist and without lesions. Tongue is normal in shape, size, and color and without lesions RESPIRATORY: Breathing is even  & unlabored, BS CTAB CARDIAC: RRR, no murmur,no extra heart sounds, BLE 1+ edema GI: Abdomen soft, normal BS, no masses, no tenderness EXTREMITIES:  Able to move X 4 extremities NEUROLOGICAL: There is no tremor. Speech is clear. Alert to self, disoriented to time and place. Left-sided weakness PSYCHIATRIC:  Affect and behavior are appropriate  Labs reviewed: Recent Labs    01/28/19 2104 01/29/19 0550 01/30/19 0559  NA 137 137 138  K 3.7 3.8 3.6  CL 99 98 99  CO2 27 29 27   GLUCOSE 137* 101* 116*  BUN 26* 25* 15  CREATININE 1.30* 1.09* 0.82  CALCIUM 8.7* 8.4* 9.1   Recent Labs    10/04/18 0600 12/29/18 0610 01/28/19 2104  AST 18 28 24   ALT 16 27 24   ALKPHOS 62 79 80  BILITOT 0.4 0.6 0.6  PROT 5.7* 6.3* 6.4*  ALBUMIN 3.0* 3.0* 3.1*   Recent Labs    10/04/18 0600 12/29/18 0610 01/28/19 2104 01/29/19 0550 01/30/19 0559  WBC 6.2 7.1 8.9 7.2 8.2  NEUTROABS 3.2 4.3  --   --  5.5  HGB 11.3* 11.5* 13.0 11.5* 12.8  HCT 35.4* 35.7* 39.7 34.8* 39.8  MCV 94.4 94.2 92.1 92.3 93.6  PLT 143* 163 165 147* 161   Lab Results  Component Value Date   TSH 5.838 (H) 12/29/2018   Lab Results  Component Value Date   HGBA1C 5.6 01/27/2018   Lab Results  Component Value Date   CHOL 149 01/28/2018   HDL 40 (L) 01/28/2018   LDLCALC 81 01/28/2018   TRIG 139 01/28/2018   CHOLHDL 3.7 01/28/2018    Significant Diagnostic Results in last 30 days:  Ct Abdomen Pelvis W Contrast  Result Date: 01/28/2019 CLINICAL DATA:  Abdominal pain and loose stools EXAM: CT ABDOMEN AND PELVIS WITH CONTRAST TECHNIQUE: Multidetector CT imaging of the abdomen and pelvis was performed using the standard protocol following bolus administration of intravenous contrast. CONTRAST:  100mL OMNIPAQUE IOHEXOL 300 MG/ML  SOLN COMPARISON:  None. FINDINGS: LOWER CHEST: Bibasilar atelectasis. HEPATOBILIARY: The hepatic contours and density are normal. There is no intra- or extrahepatic biliary dilatation. The  gallbladder is normal. PANCREAS: The pancreatic parenchymal contours are normal and there is no ductal dilatation. There is no peripancreatic fluid collection. SPLEEN: Normal. ADRENALS/URINARY TRACT: --Adrenal glands: Normal. --Right kidney/ureter: No hydronephrosis, nephroureterolithiasis, perinephric stranding or solid renal mass. --Left kidney/ureter: No hydronephrosis, nephroureterolithiasis, perinephric stranding or solid renal mass. --Urinary bladder: Mild urinary bladder wall thickening STOMACH/BOWEL: --Stomach/Duodenum: There is no hiatal hernia or other gastric abnormality. The duodenal course and caliber are normal. --Small bowel: No dilatation or inflammation. --Colon: No focal abnormality. --Appendix: Normal. VASCULAR/LYMPHATIC: There is aortic atherosclerosis without hemodynamically significant stenosis. No abdominal or pelvic lymphadenopathy. REPRODUCTIVE: Status post hysterectomy. No adnexal mass. MUSCULOSKELETAL. There is grade 1 anterolisthesis at L4-5 secondary to bilateral facet hypertrophy. T12 compression fracture appears chronic. OTHER: None. IMPRESSION: 1. No acute abnormality of the abdomen or pelvis. 2. Urinary bladder wall thickening may be exaggerated by underdistention, or a sign of cystitis. 3. Mild calcific aortic atherosclerosis (ICD10-I70.0). 4. T12 compression fracture, most likely chronic. Electronically Signed   By: Deatra RobinsonKevin  Herman M.D.   On: 01/28/2019 23:16    Assessment/Plan  1. History of CVA (cerebrovascular accident) -Continue aspirin 81 mg 1 tab daily, atorvastatin 80 mg 1 tab daily, clonidine patch 0.3 mg / 24-hour transdermal every Fridays, lisinopril-hydrochlorothiazide 20-25 mg 1 tab daily and Plavix 75 mg 1 tab daily  2. Hypothyroidism due to acquired atrophy of thyroid Lab Results  Component Value Date   TSH 5.838 (H) 12/29/2018  -Continue levothyroxine 100 mcg 1 tab daily  3. Essential hypertension -Stable, continue clonidine patch 0.3 mg / 24-hour  transdermal every Fridays, lisinopril-hydrochlorothiazide 20-25 mg 1 tab daily  4. Chronic retention of urine -Continue tamsulosin 0.4 mg 1 capsule daily  5. Major depression, recurrent, chronic (HCC) -Continue mirtazapine 30 mg 1 tab at bedtime and Celexa 20 mg 1 tab daily  6. Mood disorder (HCC) -Continue Depakote DR 250 mg 1 tab 3 times a day     I have filled out patient's discharge paperwork.   DME provided: None  Total discharge time: Greater than 30 minutes Greater than 50% of the was spent in coordination of care.   Discharge time involved coordination of the discharge process with MDS coordinator.  Monina Medina-Vargas, DNP,  FNP-BC [facility] and Adult Medicine [phone] (Monday-Friday 8:00 a.m. - 5:00 p.m.) [phone] (after hours)

## 2019-04-10 ENCOUNTER — Ambulatory Visit: Payer: Medicare Other | Admitting: Family Medicine

## 2019-04-11 ENCOUNTER — Encounter: Payer: Self-pay | Admitting: Family Medicine

## 2019-04-11 ENCOUNTER — Ambulatory Visit (INDEPENDENT_AMBULATORY_CARE_PROVIDER_SITE_OTHER): Payer: Medicare Other | Admitting: Family Medicine

## 2019-04-11 ENCOUNTER — Ambulatory Visit: Payer: Medicare Other

## 2019-04-11 ENCOUNTER — Other Ambulatory Visit: Payer: Self-pay

## 2019-04-11 DIAGNOSIS — M79641 Pain in right hand: Secondary | ICD-10-CM

## 2019-04-11 NOTE — Progress Notes (Signed)
Office Visit Note   Patient: Shawna [name]           Date of Birth: 1935/11/05           MRN: [id number] Visit Date: 04/11/2019 Requested by: Virgel Bouquet, [place],  [place] [address] PCP: Kirk Ruths, MD  Subjective: Chief Complaint  Patient presents with  . ? fracture right hand, NKI    HPI: She is a right-hand-dominant female with right hand pain.  She denies any known injury.  She states that it hurts to try to grab things.  She states that is been hurting for about a day.  Apparently she had x-rays elsewhere but I do not have those for review.  There was concern about a possible fracture.  Patient denies any previous fractures but reliability of history is uncertain.              ROS: Denies fevers or chills.  All other systems were reviewed and are negative.  Objective: Vital Signs: There were no vitals taken for this visit.  Physical Exam:  General:  Alert and oriented, in no acute distress. Pulm:  Breathing unlabored. Psy:  Normal mood, congruent affect. Skin: No rash or bruising on her right hand. Right hand: She is able to flex and extend her fingers and make a full fist.  She is able to flex and extend her wrist with no apparent pain.  She has ability to pronate and supinate her forearm with minimal discomfort.  Systematic palpation of the distal radius, ulna, wrist, metacarpals and phalanges did not reveal any distinct pain.  Axial load of the metacarpals did not cause any pain.  Imaging: X-rays right hand: It appears that she has an old healed dorsally angulated distal radius fracture.  There is mild to moderate arthritis in her wrist.  I do not see any acute fractures wrist or hand or fingers.   Assessment & Plan: 1.  Right hand pain, etiology uncertain but no definite fracture seen today. - Removable wrist splint for the next 1 to 2 weeks until pain subsides.  If still having pain after 2 weeks we will repeat x-rays.      Procedures: No procedures performed  No notes on file     PMFS History: Patient Active Problem List   Diagnosis Date Noted  . History of CVA (cerebrovascular accident) 02/12/2019  . Major depression, recurrent, chronic ([place]) 02/12/2019  . Mood disorder ([place]) 02/12/2019  . HTN (hypertension) 01/28/2019  . UTI (urinary tract infection) 01/28/2019  . Hematochezia 01/28/2019  . Chronic retention of urine 08/06/2018  . Hypertensive heart disease with congestive heart failure ([place]) 03/06/2018  . AKI (acute kidney injury) ([place]) 03/06/2018  . Dyslipidemia 03/06/2018  . Hypothyroidism due to acquired atrophy of thyroid 03/06/2018  . Glaucoma (increased eye pressure) 03/06/2018  . Depression with anxiety 03/06/2018  . Chronic cerebrovascular accident 01/26/2018  . Diastolic congestive heart failure ([place]) 07/28/2017   Past Medical History:  Diagnosis Date  . Cerebral infarction ([place])   . CKD (chronic kidney disease), stage III ([place])   . Depression   . DJD (degenerative joint disease)   . Edema   . High cholesterol   . Hyperlipidemia, unspecified   . Hypertension   . Hypothyroidism   . Malaise   . Morbid obesity ([place])   . Osteoarthritis   . Parathyroid abnormality ([place])   . Seizures ([place])    a. following remote stroke.  . Stroke ([place])  2002, 05/2017, 01/26/18, 01/30/18   a. 1964-->residual right arm wkns.  Previously on coumadin - pt says for just a few yrs.  . Subdural hematoma (HCC)    a. 10/2001 [facility] req temporal frontoparietal craniotomy following fall. ? whether or not pt on coumadin @ time.  Notes indicate yes but pt denies.  . Thyroid disease   . TMJ (dislocation of temporomandibular joint)   . Tuberculosis    a. ~ 1950  . Urinary incontinence   . Weight loss     Family History  Problem Relation Age of Onset  . Heart failure Mother        died @ 5388  . Heart attack Father        died @ 10488  . Stroke Father   . Breast cancer Neg Hx     Past Surgical History:   Procedure Laterality Date  . ABDOMINAL HYSTERECTOMY    . BACK SURGERY    . BRAIN SURGERY    . cataract surgery     2014  . LEFT HEART CATH AND CORONARY ANGIOGRAPHY N/A 06/13/2017   Procedure: LEFT HEART CATH AND CORONARY ANGIOGRAPHY;  Surgeon: Iran OuchArida, Muhammad A, MD;  Location: [facility];  Service: Cardiovascular;  Laterality: N/A;  . PARATHYROID ADENOMA REMOVAL    . TEE WITHOUT CARDIOVERSION N/A 01/31/2018   Procedure: TRANSESOPHAGEAL ECHOCARDIOGRAM (TEE);  Surgeon: Yvonne KendallEnd, Christopher, MD;  Location: [facility] ORS;  Service: Cardiovascular;  Laterality: N/A;  . TEE WITHOUT CARDIOVERSION N/A 02/01/2018   Procedure: TRANSESOPHAGEAL ECHOCARDIOGRAM (TEE);  Surgeon: Antonieta IbaGollan, Timothy J, MD;  Location: [facility] ORS;  Service: Cardiovascular;  Laterality: N/A;   Social History   Occupational History  . Not on file  Tobacco Use  . Smoking status: Never Smoker  . Smokeless tobacco: Never Used  Substance and Sexual Activity  . Alcohol use: No  . Drug use: No  . Sexual activity: Not on file

## 2019-10-26 DIAGNOSIS — F39 Unspecified mood [affective] disorder: Secondary | ICD-10-CM | POA: Diagnosis not present

## 2019-10-26 DIAGNOSIS — I10 Essential (primary) hypertension: Secondary | ICD-10-CM

## 2019-10-26 DIAGNOSIS — E785 Hyperlipidemia, unspecified: Secondary | ICD-10-CM | POA: Diagnosis not present

## 2019-10-26 DIAGNOSIS — L89153 Pressure ulcer of sacral region, stage 3: Secondary | ICD-10-CM | POA: Diagnosis not present

## 2019-10-26 DIAGNOSIS — F0150 Vascular dementia without behavioral disturbance: Secondary | ICD-10-CM | POA: Diagnosis not present

## 2019-10-26 DIAGNOSIS — R339 Retention of urine, unspecified: Secondary | ICD-10-CM

## 2019-10-26 DIAGNOSIS — K219 Gastro-esophageal reflux disease without esophagitis: Secondary | ICD-10-CM

## 2019-10-26 DIAGNOSIS — I69354 Hemiplegia and hemiparesis following cerebral infarction affecting left non-dominant side: Secondary | ICD-10-CM

## 2019-10-26 DIAGNOSIS — E039 Hypothyroidism, unspecified: Secondary | ICD-10-CM

## 2019-10-26 DIAGNOSIS — G40909 Epilepsy, unspecified, not intractable, without status epilepticus: Secondary | ICD-10-CM

## 2019-11-08 DIAGNOSIS — F331 Major depressive disorder, recurrent, moderate: Secondary | ICD-10-CM

## 2019-11-08 DIAGNOSIS — G8191 Hemiplegia, unspecified affecting right dominant side: Secondary | ICD-10-CM | POA: Diagnosis not present

## 2019-11-08 DIAGNOSIS — F0150 Vascular dementia without behavioral disturbance: Secondary | ICD-10-CM | POA: Diagnosis not present

## 2019-11-08 DIAGNOSIS — N183 Chronic kidney disease, stage 3 unspecified: Secondary | ICD-10-CM

## 2019-11-08 DIAGNOSIS — L89153 Pressure ulcer of sacral region, stage 3: Secondary | ICD-10-CM

## 2019-11-08 DIAGNOSIS — G8220 Paraplegia, unspecified: Secondary | ICD-10-CM

## 2019-11-08 DIAGNOSIS — I700 Atherosclerosis of aorta: Secondary | ICD-10-CM

## 2019-11-29 DIAGNOSIS — L89153 Pressure ulcer of sacral region, stage 3: Secondary | ICD-10-CM

## 2019-12-06 DIAGNOSIS — G8194 Hemiplegia, unspecified affecting left nondominant side: Secondary | ICD-10-CM

## 2019-12-06 DIAGNOSIS — G8191 Hemiplegia, unspecified affecting right dominant side: Secondary | ICD-10-CM

## 2019-12-06 DIAGNOSIS — F0150 Vascular dementia without behavioral disturbance: Secondary | ICD-10-CM

## 2019-12-06 DIAGNOSIS — F3340 Major depressive disorder, recurrent, in remission, unspecified: Secondary | ICD-10-CM | POA: Diagnosis not present

## 2019-12-06 DIAGNOSIS — G8220 Paraplegia, unspecified: Secondary | ICD-10-CM

## 2019-12-19 DIAGNOSIS — B351 Tinea unguium: Secondary | ICD-10-CM | POA: Diagnosis not present

## 2020-01-04 DIAGNOSIS — L89153 Pressure ulcer of sacral region, stage 3: Secondary | ICD-10-CM

## 2020-01-04 DIAGNOSIS — F339 Major depressive disorder, recurrent, unspecified: Secondary | ICD-10-CM

## 2020-01-04 DIAGNOSIS — F0150 Vascular dementia without behavioral disturbance: Secondary | ICD-10-CM | POA: Diagnosis not present

## 2020-01-04 DIAGNOSIS — R339 Retention of urine, unspecified: Secondary | ICD-10-CM

## 2020-01-04 DIAGNOSIS — K219 Gastro-esophageal reflux disease without esophagitis: Secondary | ICD-10-CM

## 2020-01-04 DIAGNOSIS — N183 Chronic kidney disease, stage 3 unspecified: Secondary | ICD-10-CM

## 2020-01-04 DIAGNOSIS — L89150 Pressure ulcer of sacral region, unstageable: Secondary | ICD-10-CM

## 2020-01-04 DIAGNOSIS — I700 Atherosclerosis of aorta: Secondary | ICD-10-CM

## 2020-01-04 DIAGNOSIS — I10 Essential (primary) hypertension: Secondary | ICD-10-CM

## 2020-01-04 DIAGNOSIS — F419 Anxiety disorder, unspecified: Secondary | ICD-10-CM | POA: Diagnosis not present

## 2020-01-04 DIAGNOSIS — E039 Hypothyroidism, unspecified: Secondary | ICD-10-CM | POA: Diagnosis not present

## 2020-01-04 DIAGNOSIS — I69341 Monoplegia of lower limb following cerebral infarction affecting right dominant side: Secondary | ICD-10-CM

## 2020-01-04 DIAGNOSIS — E785 Hyperlipidemia, unspecified: Secondary | ICD-10-CM

## 2020-02-05 DIAGNOSIS — I10 Essential (primary) hypertension: Secondary | ICD-10-CM | POA: Diagnosis not present

## 2020-02-05 DIAGNOSIS — F3342 Major depressive disorder, recurrent, in full remission: Secondary | ICD-10-CM | POA: Diagnosis not present

## 2020-02-05 DIAGNOSIS — F0150 Vascular dementia without behavioral disturbance: Secondary | ICD-10-CM | POA: Diagnosis not present

## 2020-02-05 DIAGNOSIS — G8220 Paraplegia, unspecified: Secondary | ICD-10-CM | POA: Diagnosis not present

## 2020-02-05 DIAGNOSIS — Z8673 Personal history of transient ischemic attack (TIA), and cerebral infarction without residual deficits: Secondary | ICD-10-CM | POA: Diagnosis not present

## 2020-03-07 DIAGNOSIS — B351 Tinea unguium: Secondary | ICD-10-CM | POA: Diagnosis not present

## 2020-04-02 DIAGNOSIS — F0150 Vascular dementia without behavioral disturbance: Secondary | ICD-10-CM | POA: Diagnosis not present

## 2020-04-02 DIAGNOSIS — E039 Hypothyroidism, unspecified: Secondary | ICD-10-CM | POA: Diagnosis not present

## 2020-04-02 DIAGNOSIS — F39 Unspecified mood [affective] disorder: Secondary | ICD-10-CM | POA: Diagnosis not present

## 2020-04-02 DIAGNOSIS — K219 Gastro-esophageal reflux disease without esophagitis: Secondary | ICD-10-CM | POA: Diagnosis not present

## 2020-05-16 DIAGNOSIS — B351 Tinea unguium: Secondary | ICD-10-CM | POA: Diagnosis not present

## 2020-06-02 DIAGNOSIS — R339 Retention of urine, unspecified: Secondary | ICD-10-CM

## 2020-06-02 DIAGNOSIS — G8220 Paraplegia, unspecified: Secondary | ICD-10-CM

## 2020-06-02 DIAGNOSIS — F0150 Vascular dementia without behavioral disturbance: Secondary | ICD-10-CM | POA: Diagnosis not present

## 2020-06-02 DIAGNOSIS — I10 Essential (primary) hypertension: Secondary | ICD-10-CM

## 2020-06-02 DIAGNOSIS — E039 Hypothyroidism, unspecified: Secondary | ICD-10-CM

## 2020-06-02 DIAGNOSIS — I129 Hypertensive chronic kidney disease with stage 1 through stage 4 chronic kidney disease, or unspecified chronic kidney disease: Secondary | ICD-10-CM

## 2020-06-02 DIAGNOSIS — N183 Chronic kidney disease, stage 3 unspecified: Secondary | ICD-10-CM

## 2020-06-02 DIAGNOSIS — F3341 Major depressive disorder, recurrent, in partial remission: Secondary | ICD-10-CM

## 2020-06-02 DIAGNOSIS — I700 Atherosclerosis of aorta: Secondary | ICD-10-CM

## 2020-06-30 DIAGNOSIS — E785 Hyperlipidemia, unspecified: Secondary | ICD-10-CM | POA: Diagnosis not present

## 2020-08-06 DIAGNOSIS — E039 Hypothyroidism, unspecified: Secondary | ICD-10-CM

## 2020-08-06 DIAGNOSIS — I69354 Hemiplegia and hemiparesis following cerebral infarction affecting left non-dominant side: Secondary | ICD-10-CM

## 2020-08-06 DIAGNOSIS — R339 Retention of urine, unspecified: Secondary | ICD-10-CM

## 2020-08-06 DIAGNOSIS — F0150 Vascular dementia without behavioral disturbance: Secondary | ICD-10-CM

## 2020-08-06 DIAGNOSIS — F39 Unspecified mood [affective] disorder: Secondary | ICD-10-CM

## 2020-08-06 DIAGNOSIS — K219 Gastro-esophageal reflux disease without esophagitis: Secondary | ICD-10-CM

## 2020-08-06 DIAGNOSIS — E785 Hyperlipidemia, unspecified: Secondary | ICD-10-CM

## 2020-10-06 DIAGNOSIS — F0150 Vascular dementia without behavioral disturbance: Secondary | ICD-10-CM | POA: Diagnosis not present

## 2020-10-06 DIAGNOSIS — F3340 Major depressive disorder, recurrent, in remission, unspecified: Secondary | ICD-10-CM | POA: Diagnosis not present

## 2020-10-06 DIAGNOSIS — I700 Atherosclerosis of aorta: Secondary | ICD-10-CM | POA: Diagnosis not present

## 2020-10-06 DIAGNOSIS — G8220 Paraplegia, unspecified: Secondary | ICD-10-CM | POA: Diagnosis not present

## 2020-10-06 DIAGNOSIS — I10 Essential (primary) hypertension: Secondary | ICD-10-CM

## 2020-12-05 DIAGNOSIS — F0150 Vascular dementia without behavioral disturbance: Secondary | ICD-10-CM

## 2020-12-05 DIAGNOSIS — F331 Major depressive disorder, recurrent, moderate: Secondary | ICD-10-CM

## 2020-12-05 DIAGNOSIS — G8220 Paraplegia, unspecified: Secondary | ICD-10-CM

## 2020-12-05 DIAGNOSIS — M1990 Unspecified osteoarthritis, unspecified site: Secondary | ICD-10-CM | POA: Diagnosis not present

## 2020-12-05 DIAGNOSIS — I10 Essential (primary) hypertension: Secondary | ICD-10-CM | POA: Diagnosis not present

## 2020-12-05 DIAGNOSIS — I700 Atherosclerosis of aorta: Secondary | ICD-10-CM

## 2020-12-05 DIAGNOSIS — E039 Hypothyroidism, unspecified: Secondary | ICD-10-CM | POA: Diagnosis not present

## 2020-12-05 DIAGNOSIS — R339 Retention of urine, unspecified: Secondary | ICD-10-CM

## 2021-01-07 DIAGNOSIS — B351 Tinea unguium: Secondary | ICD-10-CM | POA: Diagnosis not present

## 2021-01-29 DIAGNOSIS — Z5181 Encounter for therapeutic drug level monitoring: Secondary | ICD-10-CM | POA: Diagnosis not present

## 2021-01-29 DIAGNOSIS — I10 Essential (primary) hypertension: Secondary | ICD-10-CM | POA: Diagnosis not present

## 2021-02-16 DIAGNOSIS — G8220 Paraplegia, unspecified: Secondary | ICD-10-CM

## 2021-02-16 DIAGNOSIS — M159 Polyosteoarthritis, unspecified: Secondary | ICD-10-CM

## 2021-02-16 DIAGNOSIS — I10 Essential (primary) hypertension: Secondary | ICD-10-CM | POA: Diagnosis not present

## 2021-02-16 DIAGNOSIS — F0150 Vascular dementia without behavioral disturbance: Secondary | ICD-10-CM

## 2021-02-16 DIAGNOSIS — F3340 Major depressive disorder, recurrent, in remission, unspecified: Secondary | ICD-10-CM

## 2021-04-01 DIAGNOSIS — I10 Essential (primary) hypertension: Secondary | ICD-10-CM | POA: Diagnosis not present

## 2021-04-01 DIAGNOSIS — E039 Hypothyroidism, unspecified: Secondary | ICD-10-CM | POA: Diagnosis not present

## 2021-04-01 DIAGNOSIS — M1990 Unspecified osteoarthritis, unspecified site: Secondary | ICD-10-CM

## 2021-04-01 DIAGNOSIS — K219 Gastro-esophageal reflux disease without esophagitis: Secondary | ICD-10-CM

## 2021-04-01 DIAGNOSIS — F0150 Vascular dementia without behavioral disturbance: Secondary | ICD-10-CM | POA: Diagnosis not present

## 2021-04-01 DIAGNOSIS — F331 Major depressive disorder, recurrent, moderate: Secondary | ICD-10-CM

## 2021-04-01 DIAGNOSIS — I700 Atherosclerosis of aorta: Secondary | ICD-10-CM | POA: Diagnosis not present

## 2021-04-01 DIAGNOSIS — R339 Retention of urine, unspecified: Secondary | ICD-10-CM

## 2021-07-02 DIAGNOSIS — E785 Hyperlipidemia, unspecified: Secondary | ICD-10-CM | POA: Diagnosis not present

## 2021-07-31 DIAGNOSIS — M1990 Unspecified osteoarthritis, unspecified site: Secondary | ICD-10-CM | POA: Diagnosis not present

## 2021-07-31 DIAGNOSIS — E039 Hypothyroidism, unspecified: Secondary | ICD-10-CM | POA: Diagnosis not present

## 2021-07-31 DIAGNOSIS — I10 Essential (primary) hypertension: Secondary | ICD-10-CM | POA: Diagnosis not present

## 2021-07-31 DIAGNOSIS — E785 Hyperlipidemia, unspecified: Secondary | ICD-10-CM

## 2021-07-31 DIAGNOSIS — F331 Major depressive disorder, recurrent, moderate: Secondary | ICD-10-CM

## 2021-07-31 DIAGNOSIS — R339 Retention of urine, unspecified: Secondary | ICD-10-CM

## 2021-07-31 DIAGNOSIS — K219 Gastro-esophageal reflux disease without esophagitis: Secondary | ICD-10-CM

## 2021-07-31 DIAGNOSIS — F0150 Vascular dementia without behavioral disturbance: Secondary | ICD-10-CM | POA: Diagnosis not present

## 2021-07-31 DIAGNOSIS — G8220 Paraplegia, unspecified: Secondary | ICD-10-CM

## 2021-09-02 DIAGNOSIS — B351 Tinea unguium: Secondary | ICD-10-CM | POA: Diagnosis not present

## 2021-10-01 DIAGNOSIS — R339 Retention of urine, unspecified: Secondary | ICD-10-CM | POA: Diagnosis not present

## 2021-10-01 DIAGNOSIS — I700 Atherosclerosis of aorta: Secondary | ICD-10-CM | POA: Diagnosis not present

## 2021-10-01 DIAGNOSIS — G8220 Paraplegia, unspecified: Secondary | ICD-10-CM | POA: Diagnosis not present

## 2021-10-01 DIAGNOSIS — M1990 Unspecified osteoarthritis, unspecified site: Secondary | ICD-10-CM | POA: Diagnosis not present

## 2021-10-01 DIAGNOSIS — I10 Essential (primary) hypertension: Secondary | ICD-10-CM | POA: Diagnosis not present

## 2021-10-01 DIAGNOSIS — F339 Major depressive disorder, recurrent, unspecified: Secondary | ICD-10-CM

## 2021-10-01 DIAGNOSIS — M159 Polyosteoarthritis, unspecified: Secondary | ICD-10-CM

## 2021-10-01 DIAGNOSIS — G8222 Paraplegia, incomplete: Secondary | ICD-10-CM | POA: Diagnosis not present

## 2021-10-01 DIAGNOSIS — E039 Hypothyroidism, unspecified: Secondary | ICD-10-CM | POA: Diagnosis not present

## 2021-10-01 DIAGNOSIS — F0150 Vascular dementia without behavioral disturbance: Secondary | ICD-10-CM | POA: Diagnosis not present

## 2021-12-04 DIAGNOSIS — F0150 Vascular dementia without behavioral disturbance: Secondary | ICD-10-CM | POA: Diagnosis not present

## 2021-12-04 DIAGNOSIS — M1990 Unspecified osteoarthritis, unspecified site: Secondary | ICD-10-CM | POA: Diagnosis not present

## 2021-12-04 DIAGNOSIS — R339 Retention of urine, unspecified: Secondary | ICD-10-CM | POA: Diagnosis not present

## 2021-12-04 DIAGNOSIS — E785 Hyperlipidemia, unspecified: Secondary | ICD-10-CM | POA: Diagnosis not present

## 2021-12-04 DIAGNOSIS — I10 Essential (primary) hypertension: Secondary | ICD-10-CM | POA: Diagnosis not present

## 2021-12-04 DIAGNOSIS — K219 Gastro-esophageal reflux disease without esophagitis: Secondary | ICD-10-CM | POA: Diagnosis not present

## 2021-12-04 DIAGNOSIS — E039 Hypothyroidism, unspecified: Secondary | ICD-10-CM | POA: Diagnosis not present

## 2021-12-04 DIAGNOSIS — I700 Atherosclerosis of aorta: Secondary | ICD-10-CM | POA: Diagnosis not present

## 2021-12-04 DIAGNOSIS — F325 Major depressive disorder, single episode, in full remission: Secondary | ICD-10-CM | POA: Diagnosis not present

## 2021-12-25 DIAGNOSIS — B351 Tinea unguium: Secondary | ICD-10-CM | POA: Diagnosis not present

## 2021-12-28 DIAGNOSIS — E079 Disorder of thyroid, unspecified: Secondary | ICD-10-CM | POA: Diagnosis not present

## 2021-12-28 DIAGNOSIS — G40909 Epilepsy, unspecified, not intractable, without status epilepticus: Secondary | ICD-10-CM | POA: Diagnosis not present

## 2021-12-28 DIAGNOSIS — D649 Anemia, unspecified: Secondary | ICD-10-CM | POA: Diagnosis not present

## 2021-12-28 DIAGNOSIS — I130 Hypertensive heart and chronic kidney disease with heart failure and stage 1 through stage 4 chronic kidney disease, or unspecified chronic kidney disease: Secondary | ICD-10-CM | POA: Diagnosis not present

## 2021-12-28 LAB — BASIC METABOLIC PANEL
BUN: 15 (ref 4–21)
CO2: 33 — AB (ref 13–22)
Chloride: 102 (ref 99–108)
Creatinine: 0.6 (ref 0.5–1.1)
Glucose: 75
Potassium: 3.7 mEq/L (ref 3.5–5.1)
Sodium: 141 (ref 137–147)

## 2021-12-28 LAB — CBC AND DIFFERENTIAL
HCT: 38 (ref 36–46)
Hemoglobin: 12.7 (ref 12.0–16.0)
Platelets: 90 10*3/uL — AB (ref 150–400)
WBC: 5.5

## 2021-12-28 LAB — COMPREHENSIVE METABOLIC PANEL
Albumin: 3.6 (ref 3.5–5.0)
Calcium: 9.4 (ref 8.7–10.7)
Globulin: 2.4

## 2021-12-28 LAB — HEPATIC FUNCTION PANEL
ALT: 12 U/L (ref 7–35)
AST: 15 (ref 13–35)
Alkaline Phosphatase: 62 (ref 25–125)
Bilirubin, Total: 0.8

## 2021-12-28 LAB — CBC: RBC: 4.2 (ref 3.87–5.11)

## 2022-03-25 DIAGNOSIS — I10 Essential (primary) hypertension: Secondary | ICD-10-CM | POA: Diagnosis not present

## 2022-03-25 LAB — CBC AND DIFFERENTIAL
HCT: 37 (ref 36–46)
Hemoglobin: 12.1 (ref 12.0–16.0)
Platelets: 87 10*3/uL — AB (ref 150–400)
WBC: 5.9

## 2022-03-25 LAB — CBC: RBC: 3.88 (ref 3.87–5.11)

## 2022-04-07 DIAGNOSIS — R339 Retention of urine, unspecified: Secondary | ICD-10-CM

## 2022-04-07 DIAGNOSIS — M1990 Unspecified osteoarthritis, unspecified site: Secondary | ICD-10-CM | POA: Diagnosis not present

## 2022-04-07 DIAGNOSIS — F329 Major depressive disorder, single episode, unspecified: Secondary | ICD-10-CM | POA: Diagnosis not present

## 2022-04-07 DIAGNOSIS — F0150 Vascular dementia without behavioral disturbance: Secondary | ICD-10-CM | POA: Diagnosis not present

## 2022-04-07 DIAGNOSIS — G8250 Quadriplegia, unspecified: Secondary | ICD-10-CM | POA: Diagnosis not present

## 2022-04-07 DIAGNOSIS — Z8673 Personal history of transient ischemic attack (TIA), and cerebral infarction without residual deficits: Secondary | ICD-10-CM | POA: Diagnosis not present

## 2022-04-07 DIAGNOSIS — E785 Hyperlipidemia, unspecified: Secondary | ICD-10-CM | POA: Diagnosis not present

## 2022-04-07 DIAGNOSIS — I10 Essential (primary) hypertension: Secondary | ICD-10-CM | POA: Diagnosis not present

## 2022-04-07 DIAGNOSIS — E039 Hypothyroidism, unspecified: Secondary | ICD-10-CM | POA: Diagnosis not present

## 2022-04-07 DIAGNOSIS — K219 Gastro-esophageal reflux disease without esophagitis: Secondary | ICD-10-CM | POA: Diagnosis not present

## 2022-04-09 DIAGNOSIS — Z0189 Encounter for other specified special examinations: Secondary | ICD-10-CM | POA: Diagnosis not present

## 2022-04-19 DIAGNOSIS — I10 Essential (primary) hypertension: Secondary | ICD-10-CM | POA: Diagnosis not present

## 2022-04-19 LAB — CBC AND DIFFERENTIAL
HCT: 39 (ref 36–46)
Hemoglobin: 13 (ref 12.0–16.0)
Platelets: 94 10*3/uL — AB (ref 150–400)
WBC: 7.7

## 2022-04-19 LAB — CBC: RBC: 4.19 (ref 3.87–5.11)

## 2022-04-26 ENCOUNTER — Encounter: Payer: Self-pay | Admitting: Oncology

## 2022-04-26 ENCOUNTER — Inpatient Hospital Stay: Payer: Medicare PPO

## 2022-04-26 ENCOUNTER — Inpatient Hospital Stay: Payer: Medicare PPO | Attending: Oncology | Admitting: Oncology

## 2022-04-26 VITALS — BP 115/60 | HR 74 | Temp 96.0°F | Resp 16

## 2022-04-26 DIAGNOSIS — I129 Hypertensive chronic kidney disease with stage 1 through stage 4 chronic kidney disease, or unspecified chronic kidney disease: Secondary | ICD-10-CM | POA: Insufficient documentation

## 2022-04-26 DIAGNOSIS — Z79899 Other long term (current) drug therapy: Secondary | ICD-10-CM | POA: Insufficient documentation

## 2022-04-26 DIAGNOSIS — D696 Thrombocytopenia, unspecified: Secondary | ICD-10-CM | POA: Diagnosis not present

## 2022-04-26 DIAGNOSIS — F32A Depression, unspecified: Secondary | ICD-10-CM | POA: Diagnosis not present

## 2022-04-26 DIAGNOSIS — E7800 Pure hypercholesterolemia, unspecified: Secondary | ICD-10-CM | POA: Diagnosis not present

## 2022-04-26 DIAGNOSIS — E039 Hypothyroidism, unspecified: Secondary | ICD-10-CM | POA: Insufficient documentation

## 2022-04-26 DIAGNOSIS — I69351 Hemiplegia and hemiparesis following cerebral infarction affecting right dominant side: Secondary | ICD-10-CM | POA: Diagnosis not present

## 2022-04-26 DIAGNOSIS — N183 Chronic kidney disease, stage 3 unspecified: Secondary | ICD-10-CM | POA: Insufficient documentation

## 2022-04-26 DIAGNOSIS — R7989 Other specified abnormal findings of blood chemistry: Secondary | ICD-10-CM | POA: Insufficient documentation

## 2022-04-26 LAB — FOLATE: Folate: 13 ng/mL (ref >5.9)

## 2022-04-26 LAB — COMPREHENSIVE METABOLIC PANEL
ALT: 14 U/L (ref 0–44)
AST: 21 U/L (ref 15–41)
Albumin: 3.7 g/dL (ref 3.5–5.0)
Alkaline Phosphatase: 66 U/L (ref 38–126)
Anion gap: 7 (ref 5–15)
BUN: 18 mg/dL (ref 8–23)
CO2: 30 mmol/L (ref 22–32)
Calcium: 9 mg/dL (ref 8.9–10.3)
Chloride: 102 mmol/L (ref 98–111)
Creatinine, Ser: 0.58 mg/dL (ref 0.44–1.00)
GFR, Estimated: >60 mL/min (ref >60)
Glucose, Bld: 141 mg/dL — ABNORMAL HIGH (ref 70–99)
Potassium: 3.5 mmol/L (ref 3.5–5.1)
Sodium: 139 mmol/L (ref 135–145)
Total Bilirubin: 1 mg/dL (ref 0.3–1.2)
Total Protein: 6.6 g/dL (ref 6.5–8.1)

## 2022-04-26 LAB — CBC WITH DIFFERENTIAL/PLATELET
Abs Immature Granulocytes: 0.02 10*3/uL (ref 0.00–0.07)
Basophils Absolute: 0 10*3/uL (ref 0.0–0.1)
Basophils Relative: 1 %
Eosinophils Absolute: 0.1 10*3/uL (ref 0.0–0.5)
Eosinophils Relative: 2 %
HCT: 40.1 % (ref 36.0–46.0)
Hemoglobin: 13.1 g/dL (ref 12.0–15.0)
Immature Granulocytes: 0 %
Lymphocytes Relative: 25 %
Lymphs Abs: 1.6 10*3/uL (ref 0.7–4.0)
MCH: 31 pg (ref 26.0–34.0)
MCHC: 32.7 g/dL (ref 30.0–36.0)
MCV: 94.8 fL (ref 80.0–100.0)
Monocytes Absolute: 0.5 10*3/uL (ref 0.1–1.0)
Monocytes Relative: 9 %
Neutro Abs: 3.9 10*3/uL (ref 1.7–7.7)
Neutrophils Relative %: 63 %
Platelets: 104 10*3/uL — ABNORMAL LOW (ref 150–400)
RBC: 4.23 MIL/uL (ref 3.87–5.11)
RDW: 13.2 % (ref 11.5–15.5)
WBC: 6.1 10*3/uL (ref 4.0–10.5)
nRBC: 0 % (ref 0.0–0.2)

## 2022-04-26 LAB — HIV ANTIBODY (ROUTINE TESTING W REFLEX): HIV Screen 4th Generation wRfx: NONREACTIVE

## 2022-04-26 LAB — HEPATITIS PANEL, ACUTE
HCV Ab: NONREACTIVE
Hep A IgM: NONREACTIVE
Hep B C IgM: NONREACTIVE
Hepatitis B Surface Ag: NONREACTIVE

## 2022-04-26 LAB — IMMATURE PLATELET FRACTION: Immature Platelet Fraction: 15.1 % — ABNORMAL HIGH (ref 1.2–8.6)

## 2022-04-26 LAB — VITAMIN B12: Vitamin B-12: 738 pg/mL (ref 180–914)

## 2022-04-26 LAB — TSH: TSH: 0.229 u[IU]/mL — ABNORMAL LOW (ref 0.350–4.500)

## 2022-04-26 LAB — LACTATE DEHYDROGENASE: LDH: 185 U/L (ref 98–192)

## 2022-04-26 NOTE — Assessment & Plan Note (Signed)
Acute on chronic thrombocytopenia. Possible side effect secondary to Depakote.  Rule out other etiologies. Check CBC, CMP, B12, folate, TSH, multiple myeloma panel, light chain ratio, flow cytometry, immature reticulocyte fraction, hepatitis panel, HIV.

## 2022-04-26 NOTE — Assessment & Plan Note (Addendum)
Patient has hypothyroidism and is on levothyroxine. Likely need dose reduction.  Will defer to primary care provider.

## 2022-04-26 NOTE — Progress Notes (Signed)
Hematology/Oncology Consult Note [phone] [phone]    Patient Care Team: Kirk Ruths, MD as PCP - General (Internal Medicine) Rockey Situ Kathlene November, MD as Consulting Physician (Cardiology) Gerlene Fee, NP as Nurse Practitioner (Geriatric Medicine)  REFERRING PROVIDER: Jearld Fenton, NP  CHIEF COMPLAINTS/REASON FOR VISIT:  Evaluation of thrombocytopenia  HISTORY OF PRESENTING ILLNESS:  Shawna [name] is a 86 y.o. female who was seen in consultation at the request of Baity, Coralie Keens, NP for evaluation of    Patient had abnormal Labs done at [facility].  Lab reports were not available to me.  Per daughter will call this patient today, levels are ranging in the 80000 to 90000s Patient has intermittent right thrombocytopenia with platelet count in the 140000 ranges in 2020. Patient denies unintentional weight loss, she has good appetite.  Denies night sweating or fever. No active bleeding  Issue.  Patient has dental issue which requires tooth extraction.  Recent labs were ordered upon oral surgeon's request. Denies hematochezia, hematuria, hematemesis, epistaxis, black tarry stool.  Patient has no easy bruising.    Patient has been on Depakote for the past 3 to 4 years. Patient has a history of stroke, right hemiplegia.   MEDICAL HISTORY:  Past Medical History:  Diagnosis Date   Cerebral infarction [facility])    CKD (chronic kidney disease), stage III (HCC)    Depression    DJD (degenerative joint disease)    Edema    High cholesterol    Hyperlipidemia, unspecified    Hypertension    Hypothyroidism    Malaise    Morbid obesity ([place])    Osteoarthritis    Parathyroid abnormality (HCC)    Seizures ([place])    a. following remote stroke.   Stroke [facility]) 2002, 05/2017, 01/26/18, 01/30/18   a. 1964-->residual right arm wkns.  Previously on coumadin - pt says for just a few yrs.   Subdural hematoma ([place])    a. 10/2001 SDH req temporal  frontoparietal craniotomy following fall. ? whether or not pt on coumadin @ time.  Notes indicate yes but pt denies.   Thyroid disease    TMJ (dislocation of temporomandibular joint)    Tuberculosis    a. ~ 1950   Urinary incontinence    Weight loss     SURGICAL HISTORY: Past Surgical History:  Procedure Laterality Date   ABDOMINAL HYSTERECTOMY     BACK SURGERY     BRAIN SURGERY     cataract surgery     2014   LEFT HEART CATH AND CORONARY ANGIOGRAPHY N/A 06/13/2017   Procedure: LEFT HEART CATH AND CORONARY ANGIOGRAPHY;  Surgeon: Wellington Hampshire, MD;  Location: [place] CV LAB;  Service: Cardiovascular;  Laterality: N/A;   PARATHYROID ADENOMA REMOVAL     TEE WITHOUT CARDIOVERSION N/A 01/31/2018   Procedure: TRANSESOPHAGEAL ECHOCARDIOGRAM (TEE);  Surgeon: Nelva Bush, MD;  Location: [facility] ORS;  Service: Cardiovascular;  Laterality: N/A;   TEE WITHOUT CARDIOVERSION N/A 02/01/2018   Procedure: TRANSESOPHAGEAL ECHOCARDIOGRAM (TEE);  Surgeon: Minna Merritts, MD;  Location: [facility] ORS;  Service: Cardiovascular;  Laterality: N/A;    SOCIAL HISTORY: Social History   Socioeconomic History   Marital status: Married    Spouse name: Jeneen Rinks   Number of children: 2   Years of education: BS   Highest education level: Not on file  Occupational History   Not on file  Tobacco Use   Smoking status: Never   Smokeless tobacco: Never  Vaping Use  Vaping Use: Never used  Substance and Sexual Activity   Alcohol use: No   Drug use: No   Sexual activity: Not on file  Other Topics Concern   Not on file  Social History Narrative   [place].  Active around the house.  Does not routinely exercise or drive.  Does own grocery shopping.  10/18/2018 residing at [place] Strain: Low Risk  (01/29/2019)   Overall Financial Resource Strain (CARDIA)    Difficulty of Paying Living Expenses: Not  hard at all  Food Insecurity: Unknown (01/29/2019)   Hunger Vital Sign    Worried About Running Out of Food in the Last Year: Patient refused    [place] in the Last Year: Patient refused  Transportation Needs: Unknown (01/29/2019)   PRAPARE - Hydrologist (Medical): Patient refused    Lack of Transportation (Non-Medical): Patient refused  Physical Activity: Unknown (01/29/2019)   Exercise Vital Sign    Days of Exercise per Week: Patient refused    Minutes of Exercise per Session: Patient refused  Stress: Not on file  Social Connections: Unknown (01/29/2019)   Social Connection and Isolation Panel [NHANES]    Frequency of Communication with Friends and Family: Patient refused    Frequency of Social Gatherings with Friends and Family: Patient refused    Attends Religious Services: Patient refused    Active Member of Clubs or Organizations: Patient refused    Attends Archivist Meetings: Patient refused    Marital Status: Patient refused  Intimate Partner Violence: Not At Risk (01/29/2019)   Humiliation, Afraid, Rape, and Kick questionnaire    Fear of Current or Ex-Partner: No    Emotionally Abused: No    Physically Abused: No    Sexually Abused: No    FAMILY HISTORY: Family History  Problem Relation Age of Onset   Heart failure Mother        died @ 62   Heart attack Father        died @ 63   Stroke Father    Hypertension Sister    Diabetes Sister    Hypertension Brother    Diabetes Brother    Breast cancer Neg Hx     ALLERGIES:  has No Known Allergies.  MEDICATIONS:  Current Outpatient Medications  Medication Sig Dispense Refill   aspirin EC 81 MG tablet Take 1 tablet (81 mg total) by mouth daily.     atorvastatin (LIPITOR) 80 MG tablet Take 1 tablet (80 mg total) by mouth daily at 6 PM. 30 tablet 2   chlorhexidine (PERIDEX) 0.12 % solution 15 mLs daily.     citalopram (CELEXA) 20 MG tablet Take 20 mg by mouth daily.       cloNIDine (CATAPRES - DOSED IN MG/24 HR) 0.3 mg/24hr patch Place 1 patch onto the skin every Friday.   3   clopidogrel (PLAVIX) 75 MG tablet Take 1 tablet (75 mg total) by mouth daily. 30 tablet 2   divalproex (DEPAKOTE SPRINKLE) 125 MG capsule Take 125 mg by mouth 2 (two) times daily.     divalproex (DEPAKOTE) 250 MG DR tablet Take 250 mg by mouth 3 (three) times daily.     hydrochlorothiazide (MICROZIDE) 12.5 MG capsule Take 12.5 mg by mouth daily.     hydrocortisone 2.5 % cream Apply topically.     latanoprost (XALATAN)  0.005 % ophthalmic solution Place 1 drop into both eyes at bedtime.   3   levothyroxine (SYNTHROID) 100 MCG tablet Take 100 mcg by mouth daily.     lisinopril (ZESTRIL) 20 MG tablet Take 20 mg by mouth daily.     lisinopril-hydrochlorothiazide (PRINZIDE,ZESTORETIC) 20-25 MG tablet Take 1 tablet by mouth daily.      Menthol, Topical Analgesic, (BIOFREEZE) 4 % GEL Apply 1 application topically 3 (three) times daily.     mirtazapine (REMERON) 30 MG tablet Take 30 mg by mouth at bedtime.     NYAMYC powder Apply topically.     polyethylene glycol (MIRALAX / GLYCOLAX) packet Take 17 g by mouth daily.      tamsulosin (FLOMAX) 0.4 MG CAPS capsule Take 1 capsule (0.4 mg total) by mouth daily. 30 capsule 1   vitamin B-12 (CYANOCOBALAMIN) 500 MCG tablet Take 500 mcg by mouth daily.     No current facility-administered medications for this visit.    Review of Systems  Constitutional:  Positive for fatigue. Negative for appetite change, chills and fever.  HENT:   Negative for hearing loss and voice change.   Eyes:  Negative for eye problems.  Respiratory:  Negative for chest tightness and cough.   Cardiovascular:  Negative for chest pain.  Gastrointestinal:  Negative for abdominal distention, abdominal pain and blood in stool.  Endocrine: Negative for hot flashes.  Genitourinary:  Negative for difficulty urinating and frequency.   Musculoskeletal:  Negative for arthralgias.  Skin:   Negative for itching and rash.  Neurological:  Negative for extremity weakness.       Right hemiaplasia  Hematological:  Negative for adenopathy.  Psychiatric/Behavioral:  Negative for confusion.     PHYSICAL EXAMINATION: ECOG PERFORMANCE STATUS: 3 - Symptomatic, >50% confined to bed Vitals:   04/26/22 1000  BP: 115/60  Pulse: 74  Resp: 16  Temp: (!) 96 F (35.6 C)  SpO2: 96%   Filed Weights    Physical Exam Constitutional:      General: She is not in acute distress.    Comments: Patient sits in the wheelchair  HENT:     Head: Normocephalic and atraumatic.  Eyes:     General: No scleral icterus. Cardiovascular:     Rate and Rhythm: Normal rate and regular rhythm.     Heart sounds: Normal heart sounds.  Pulmonary:     Effort: Pulmonary effort is normal. No respiratory distress.     Breath sounds: No wheezing.  Abdominal:     General: Bowel sounds are normal. There is no distension.     Palpations: Abdomen is soft.  Musculoskeletal:        General: Normal range of motion.     Cervical back: Normal range of motion and neck supple.  Skin:    General: Skin is warm and dry.     Findings: No erythema or rash.  Neurological:     Mental Status: She is alert. Mental status is at baseline.     Comments: Right hemiplegia  Psychiatric:        Mood and Affect: Mood normal.      LABORATORY DATA:  I have reviewed the data as listed    Latest Ref Rng & Units 04/26/2022   11:32 AM 01/30/2019    5:59 AM 01/29/2019    5:50 AM  CBC  WBC 4.0 - 10.5 K/uL 6.1  8.2  7.2   Hemoglobin 12.0 - 15.0 g/dL 13.1  12.8  11.5  Hematocrit 36.0 - 46.0 % 40.1  39.8  34.8   Platelets 150 - 400 K/uL 104  161  147       Latest Ref Rng & Units 04/26/2022   11:32 AM 01/30/2019    5:59 AM 01/29/2019    5:50 AM  CMP  Glucose 70 - 99 mg/dL 141  116  101   BUN 8 - 23 mg/dL _0 Creatinine 0.44 - 1.00 mg/dL 0.58  0.82  1.09   Sodium 135 - 145 mmol/L 139  138  137   Potassium 3.5 - 5.1  mmol/L 3.5  3.6  3.8   Chloride 98 - 111 mmol/L 102  99  98   CO2 22 - 32 mmol/L _1 Calcium 8.9 - 10.3 mg/dL 9.0  9.1  8.4   Total Protein 6.5 - 8.1 g/dL 6.6     Total Bilirubin 0.3 - 1.2 mg/dL 1.0     Alkaline Phos 38 - 126 U/L 66     AST 15 - 41 U/L 21     ALT 0 - 44 U/L 14        RADIOGRAPHIC STUDIES: I have personally reviewed the radiological images as listed and agreed with the findings in the report.  No results found.   ASSESSMENT & PLAN:   Thrombocytopenia ([place]) Acute on chronic thrombocytopenia. Possible side effect secondary to Depakote.  Rule out other etiologies. Check CBC, CMP, B12, folate, TSH, multiple myeloma panel, light chain ratio, flow cytometry, immature reticulocyte fraction, hepatitis panel, HIV.  Decreased thyroid stimulating hormone (TSH) level Patient has hypothyroidism and is on levothyroxine. Likely need dose reduction.  Will defer to primary care provider.     #Labs today.  Follow-up appointment to be determined.  Orders Placed This Encounter  Procedures   Comprehensive metabolic panel    Standing Status:   Future    Number of Occurrences:   1    Standing Expiration Date:   04/27/2023   CBC with Differential/Platelet    Standing Status:   Future    Number of Occurrences:   1    Standing Expiration Date:   04/27/2023   Vitamin B12    Standing Status:   Future    Number of Occurrences:   1    Standing Expiration Date:   04/27/2023   Folate    Standing Status:   Future    Number of Occurrences:   1    Standing Expiration Date:   04/27/2023   TSH    Standing Status:   Future    Number of Occurrences:   1    Standing Expiration Date:   04/27/2023   Multiple Myeloma Panel (SPEP&IFE w/QIG)    Standing Status:   Future    Number of Occurrences:   1    Standing Expiration Date:   04/27/2023   Kappa/lambda light chains    Standing Status:   Future    Number of Occurrences:   1    Standing Expiration Date:   04/27/2023   Flow  cytometry panel-leukemia/lymphoma work-up    Standing Status:   Future    Number of Occurrences:   1    Standing Expiration Date:   04/27/2023   Lactate dehydrogenase    Standing Status:   Future    Number of Occurrences:   1    Standing Expiration Date:   04/27/2023   Immature Platelet Fraction  Standing Status:   Future    Number of Occurrences:   1    Standing Expiration Date:   04/27/2023   Hepatitis panel, acute    Standing Status:   Future    Number of Occurrences:   1    Standing Expiration Date:   04/27/2023   HIV Antibody (routine testing w rflx)    Standing Status:   Future    Number of Occurrences:   1    Standing Expiration Date:   04/27/2023    All questions were answered. The patient knows to call the clinic with any problems questions or concerns.  Cc Jearld Fenton, NP  Thank you for this kind referral and the opportunity to participate in the care of this patient. A copy of today's note is routed to referring provider    Earlie Server, MD, PhD 04/26/2022

## 2022-04-27 ENCOUNTER — Encounter: Payer: Self-pay | Admitting: Oncology

## 2022-04-27 LAB — KAPPA/LAMBDA LIGHT CHAINS
Kappa free light chain: 54.3 mg/L — ABNORMAL HIGH (ref 3.3–19.4)
Kappa, lambda light chain ratio: 1.8 — ABNORMAL HIGH (ref 0.26–1.65)
Lambda free light chains: 30.1 mg/L — ABNORMAL HIGH (ref 5.7–26.3)

## 2022-04-28 LAB — COMP PANEL: LEUKEMIA/LYMPHOMA

## 2022-05-04 LAB — MULTIPLE MYELOMA PANEL, SERUM
Albumin SerPl Elph-Mcnc: 3.6 g/dL (ref 2.9–4.4)
Albumin/Glob SerPl: 1.5 (ref 0.7–1.7)
Alpha 1: 0.2 g/dL (ref 0.0–0.4)
Alpha2 Glob SerPl Elph-Mcnc: 0.5 g/dL (ref 0.4–1.0)
B-Globulin SerPl Elph-Mcnc: 1 g/dL (ref 0.7–1.3)
Gamma Glob SerPl Elph-Mcnc: 0.8 g/dL (ref 0.4–1.8)
Globulin, Total: 2.5 g/dL (ref 2.2–3.9)
IgA: 234 mg/dL (ref 64–422)
IgG (Immunoglobin G), Serum: 1047 mg/dL (ref 586–1602)
IgM (Immunoglobulin M), Srm: 59 mg/dL (ref 26–217)
Total Protein ELP: 6.1 g/dL (ref 6.0–8.5)

## 2022-05-07 ENCOUNTER — Telehealth: Payer: Self-pay

## 2022-05-07 NOTE — Telephone Encounter (Signed)
Dr. Cathie Hoops, pt's daughter is saying that pt will need foley in order for 24 hour urine to be collected since she is incontinent. If you are ok with this, I will fax order.

## 2022-05-07 NOTE — Telephone Encounter (Signed)
-----   Message from [name], MD sent at 05/06/2022 11:12 PM EDT ----- Please arrange her to get 24h UPEP.  Schedule follow up virtual visit in 2-3 weeks.

## 2022-05-11 ENCOUNTER — Telehealth: Payer: Self-pay

## 2022-05-11 NOTE — Telephone Encounter (Signed)
Please schedule pt for MD only follow up in about 2-3 weeks and inform pt's daughter [name] of appts. Thanks

## 2022-05-11 NOTE — Telephone Encounter (Signed)
Error

## 2022-05-13 ENCOUNTER — Other Ambulatory Visit: Payer: Self-pay

## 2022-05-13 ENCOUNTER — Inpatient Hospital Stay: Payer: Medicare PPO | Attending: Oncology

## 2022-05-13 DIAGNOSIS — D696 Thrombocytopenia, unspecified: Secondary | ICD-10-CM

## 2022-05-18 LAB — IFE+PROTEIN ELECTRO, 24-HR UR
% BETA, Urine: 37.4 %
ALPHA 1 URINE: 1.9 %
Albumin, U: 38.4 %
Alpha 2, Urine: 13 %
GAMMA GLOBULIN URINE: 9.3 %
Total Protein, Urine-Ur/day: 237 mg/24 hr — ABNORMAL HIGH (ref 30–150)
Total Protein, Urine: 26.3 mg/dL
Total Volume: 900

## 2022-05-19 ENCOUNTER — Telehealth: Payer: Self-pay | Admitting: Student

## 2022-05-19 ENCOUNTER — Non-Acute Institutional Stay (SKILLED_NURSING_FACILITY): Payer: Medicare Other | Admitting: Student

## 2022-05-19 ENCOUNTER — Encounter: Payer: Self-pay | Admitting: Student

## 2022-05-19 DIAGNOSIS — D696 Thrombocytopenia, unspecified: Secondary | ICD-10-CM | POA: Diagnosis not present

## 2022-05-19 DIAGNOSIS — M533 Sacrococcygeal disorders, not elsewhere classified: Secondary | ICD-10-CM

## 2022-05-19 NOTE — Telephone Encounter (Signed)
Called daughter Kennyth Lose [phone]  A few weeks ago they felt a knot or a cyst in the lower back and would like me to take a look at the area.   Second concern is that patient has a loose tooth. Oral surgeon asked for medical history -- specifically the blood test results for the last 7 months. It has been low for several months. She is on plavix and depakote and this could be contributing to low levels of platelets.  Last week she asked for a urine sample, and they are waiting to find out the final results. She is looking forward to figure out the cause of the issue.   She has a family care plan meeting tomorrow.   Will see patient to look at back, reassured okay to get oral surgery. Will need to hold plavix 5d before procedure.   Tomasa Rand, MD, [facility] [phone]

## 2022-05-19 NOTE — Progress Notes (Signed)
Location:  [facility] Room Number: NO/407 Place of Service:  SNF (31) Provider:  Dewayne Shorter, MD  Patient Care Team: Dewayne Shorter, MD as PCP - General (Family Medicine) Rockey Situ, Kathlene November, MD as Consulting Physician (Cardiology) Gerlene Fee, NP as Nurse Practitioner (Geriatric Medicine)  Extended Emergency Contact Information Primary Emergency Contact: Brucker,[name] Address: [address]          [place], [place] [address] Johnnette Litter of [place] Phone: [phone] Relation: Daughter Secondary Emergency Contact: Eric Form Address: [address]          [place], [place] [address] Johnnette Litter of [place] Phone: [phone] Mobile Phone: [phone] Relation: Spouse  Code Status:  FULL Goals of care: Advanced Directive information    05/19/2022    9:12 AM  Advanced Directives  Does Patient Have a Medical Advance Directive? Yes  Type of Paramedic of [place];Living will  Copy of [place] in Chart? Yes - validated most recent copy scanned in chart (See row information)     Chief Complaint  Patient presents with   Acute Visit    Patient is being seen for back concerns    HPI:  Pt is a 86 y.o. female seen today for an acute visit for evaluation of coccyx region.  Daughter states that she and the nurse previously evaluated to determine whether or not there was a cyst present.  Nursing aide available for assistance at the time of evaluation who states that it is seemingly unchanged.  Patient states she is in pain all over, tenderness from head to toe.  She says she does not want to get out of bed today for meals or go outside.  She says "I will see later on" when asked if she wanted to go outside in the afternoon.   Past Medical History:  Diagnosis Date   Cerebral infarction ([place])    CKD (chronic kidney disease), stage III (HCC)    Depression    DJD  (degenerative joint disease)    Edema    High cholesterol    Hyperlipidemia, unspecified    Hypertension    Hypothyroidism    Malaise    Morbid obesity ([place])    Osteoarthritis    Parathyroid abnormality (HCC)    Seizures ([place])    a. following remote stroke.   Stroke [facility]) 2002, 05/2017, 01/26/18, 01/30/18   a. 1964-->residual right arm wkns.  Previously on coumadin - pt says for just a few yrs.   Subdural hematoma ([place])    a. 10/2001 SDH req temporal frontoparietal craniotomy following fall. ? whether or not pt on coumadin @ time.  Notes indicate yes but pt denies.   Thyroid disease    TMJ (dislocation of temporomandibular joint)    Tuberculosis    a. ~ 1950   Urinary incontinence    Weight loss    Past Surgical History:  Procedure Laterality Date   ABDOMINAL HYSTERECTOMY     BACK SURGERY     BRAIN SURGERY     cataract surgery     2014   LEFT HEART CATH AND CORONARY ANGIOGRAPHY N/A 06/13/2017   Procedure: LEFT HEART CATH AND CORONARY ANGIOGRAPHY;  Surgeon: Wellington Hampshire, MD;  Location: [place] CV LAB;  Service: Cardiovascular;  Laterality: N/A;   PARATHYROID ADENOMA REMOVAL     TEE WITHOUT CARDIOVERSION N/A 01/31/2018   Procedure: TRANSESOPHAGEAL ECHOCARDIOGRAM (TEE);  Surgeon: Nelva Bush, MD;  Location: [facility]  ORS;  Service: Cardiovascular;  Laterality: N/A;   TEE WITHOUT CARDIOVERSION N/A 02/01/2018   Procedure: TRANSESOPHAGEAL ECHOCARDIOGRAM (TEE);  Surgeon: Antonieta Iba, MD;  Location: [facility] ORS;  Service: Cardiovascular;  Laterality: N/A;    No Known Allergies  Outpatient Encounter Medications as of 05/19/2022  Medication Sig   acetaminophen (TYLENOL) 325 MG tablet Take 650 mg by mouth every 4 (four) hours as needed.   atorvastatin (LIPITOR) 80 MG tablet Take 1 tablet (80 mg total) by mouth daily at 6 PM.   bisacodyl (DULCOLAX) 10 MG suppository Place 10 mg rectally as needed for moderate constipation.   CITALOPRAM HYDROBROMIDE PO Take 10 mg by mouth daily.    clopidogrel (PLAVIX) 75 MG tablet Take 1 tablet (75 mg total) by mouth daily.   divalproex (DEPAKOTE SPRINKLE) 125 MG capsule Take 125 mg by mouth 2 (two) times daily.   hydrochlorothiazide (MICROZIDE) 12.5 MG capsule Take 12.5 mg by mouth daily.   hydrocortisone 2.5 % cream Apply topically.   latanoprost (XALATAN) 0.005 % ophthalmic solution Place 1 drop into both eyes at bedtime.    levothyroxine (SYNTHROID) 100 MCG tablet Take 100 mcg by mouth daily.   lisinopril (ZESTRIL) 20 MG tablet Take 20 mg by mouth daily.   magnesium hydroxide (MILK OF MAGNESIA) 400 MG/5ML suspension Take 30 mLs by mouth daily as needed for mild constipation.   Menthol, Topical Analgesic, (BIOFREEZE) 4 % GEL Apply 1 application topically 3 (three) times daily.   NYAMYC powder Apply topically.   polyethylene glycol (MIRALAX / GLYCOLAX) packet Take 17 g by mouth daily.    tamsulosin (FLOMAX) 0.4 MG CAPS capsule Take 1 capsule (0.4 mg total) by mouth daily.   traMADol (ULTRAM) 50 MG tablet Take 50 mg by mouth every 8 (eight) hours as needed for moderate pain.   [DISCONTINUED] citalopram (CELEXA) 20 MG tablet Take 10 mg by mouth daily.   [DISCONTINUED] aspirin EC 81 MG tablet Take 1 tablet (81 mg total) by mouth daily.   [DISCONTINUED] chlorhexidine (PERIDEX) 0.12 % solution 15 mLs daily.   [DISCONTINUED] cloNIDine (CATAPRES - DOSED IN MG/24 HR) 0.3 mg/24hr patch Place 1 patch onto the skin every Friday.    [DISCONTINUED] divalproex (DEPAKOTE) 250 MG DR tablet Take 250 mg by mouth 3 (three) times daily.   [DISCONTINUED] lisinopril-hydrochlorothiazide (PRINZIDE,ZESTORETIC) 20-25 MG tablet Take 1 tablet by mouth daily.    [DISCONTINUED] mirtazapine (REMERON) 30 MG tablet Take 30 mg by mouth at bedtime.   [DISCONTINUED] vitamin B-12 (CYANOCOBALAMIN) 500 MCG tablet Take 500 mcg by mouth daily.   No facility-administered encounter medications on file as of 05/19/2022.    Review of Systems  Reason unable to perform ROS:  Limited due to dementia and stroke deficits, however patient able to state she is in significant pain all over.   Immunization History  Administered Date(s) Administered   Influenza Split 07/18/2014, 07/23/2015   Influenza, High Dose Seasonal PF 06/14/2017   Influenza-Unspecified 05/25/2012, 05/31/2016, 06/01/2018, 06/21/2021   Pneumococcal Polysaccharide-23 08/30/2004, 08/04/2017   Pertinent  Health Maintenance Due  Topic Date Due   INFLUENZA VACCINE  03/30/2022   DEXA SCAN  Discontinued      01/29/2019    1:00 PM 01/29/2019   10:30 PM 01/30/2019    2:00 PM 04/26/2022   10:00 AM 05/19/2022    9:12 AM  Fall Risk  Falls in the past year?     0  Was there an injury with Fall?     0  Fall  Risk Category Calculator     0  Fall Risk Category     Low  Patient Fall Risk Level High fall risk High fall risk High fall risk High fall risk Moderate fall risk  Patient at Risk for Falls Due to     History of fall(s)  Fall risk Follow up     Falls evaluation completed   Functional Status Survey:    There were no vitals filed for this visit. There is no height or weight on file to calculate BMI. Physical Exam Musculoskeletal:     Comments: Prominent coccyx bone palpable. No fluctuance or skin changes visible.   Skin:    General: Skin is warm and dry.     Comments: Sacral skin clean, dry, intact  Neurological:     Mental Status: She is alert.    Labs reviewed: Recent Labs    04/26/22 1132  NA 139  K 3.5  CL 102  CO2 30  GLUCOSE 141*  BUN 18  CREATININE 0.58  CALCIUM 9.0   Recent Labs    04/26/22 1132  AST 21  ALT 14  ALKPHOS 66  BILITOT 1.0  PROT 6.6  ALBUMIN 3.7   Recent Labs    04/26/22 1132  WBC 6.1  NEUTROABS 3.9  HGB 13.1  HCT 40.1  MCV 94.8  PLT 104*   Lab Results  Component Value Date   TSH 0.229 (L) 04/26/2022   Lab Results  Component Value Date   HGBA1C 5.6 01/27/2018   Lab Results  Component Value Date   CHOL 149 01/28/2018   HDL 40 (L)  01/28/2018   LDLCALC 81 01/28/2018   TRIG 139 01/28/2018   CHOLHDL 3.7 01/28/2018    Significant Diagnostic Results in last 30 days:  No results found.  Assessment/Plan 1. Thrombocytopenia (HCC) Patient is seen and managed by heme-onc for this issue. Numerous pending labs and discussion with specialist. Will defer conversations to ordering physician. Most recent platelets >100 k, and recommended minimum for procedures is 50 k. Discussed with daughter okay to have procedure with this platelet level. Discussed importance of holding Plavix 5d before procedure. She will call once the date is set.   2. Coccyx pain Patient has prominent coccyx on exam. Previously some concern for a cyst or abscess however overlying skin is healthy. Patient is about 10 lbs down in the last year- for her this is about 5% of body weight. This along with muscle atrophy could contribute to the prominence of her coccyx bone. Discussed with daughter no indication for further imaging at this time. Will follow up in three months to determine any changes. Will continue regulatory visits until that time. Continue Tylenol 1000 mg TID and Tramadol 50 mg q8hr prn for pain.   Family/ staff Communication: daughter  Labs/tests ordered:  none   Coralyn Helling, MD, [facility] [phone]

## 2022-06-02 ENCOUNTER — Inpatient Hospital Stay: Payer: Medicare PPO | Attending: Oncology | Admitting: Oncology

## 2022-06-02 ENCOUNTER — Telehealth: Payer: Self-pay

## 2022-06-02 ENCOUNTER — Telehealth: Payer: Self-pay | Admitting: Oncology

## 2022-06-02 ENCOUNTER — Encounter: Payer: Self-pay | Admitting: Oncology

## 2022-06-02 DIAGNOSIS — R5383 Other fatigue: Secondary | ICD-10-CM

## 2022-06-02 DIAGNOSIS — D696 Thrombocytopenia, unspecified: Secondary | ICD-10-CM | POA: Insufficient documentation

## 2022-06-02 DIAGNOSIS — R7989 Other specified abnormal findings of blood chemistry: Secondary | ICD-10-CM | POA: Diagnosis not present

## 2022-06-02 NOTE — Telephone Encounter (Signed)
pt daughter called in to request today's appt be Virtual she is not feeling well to bring mother. Mom will not be present for virtual visit due to living in [place] household.Daughter would like to recieve informaton.

## 2022-06-02 NOTE — Progress Notes (Signed)
HEMATOLOGY-ONCOLOGY TeleHEALTH VISIT PROGRESS NOTE  I connected with Shawna [name] on 06/02/22  at  2:00 PM EDT by video enabled telemedicine visit and verified that I am speaking with the correct person using two identifiers. I discussed the limitations, risks, security and privacy concerns of performing an evaluation and management service by telemedicine and the availability of in-person appointments. The patient expressed understanding and agreed to proceed.   Other persons participating in the visit and their role in the encounter:  [name]  Patient's location: Nursing home facility. Provider's location: office Chief Complaint: Follow-up for thrombocytopenia.  ASSESSMENT & PLAN:   Thrombocytopenia ([place]) Acute on chronic thrombocytopenia. Reviewed and discussed with patient. CBC showed a platelet count of 104000, this is at her baseline. Adequate vitamin B12, folate, negative peripheral blood flow cytometry.  Negative M protein on SPEP, slightly nonspecific increased light chain ratio.  24-hour urine protein electrophoresis negative M protein. Increased immature platelet count. Discussed with patient and daughter that thrombocytopenia is stable and is likely secondary to reactive process, drug side effects/ITP.  I recommend observation.  Decreased thyroid stimulating hormone (TSH) level Patient is on levothyroxine replacement.  I recommend patient to discuss with primary care provider regarding dosage adjustment.   Orders Placed This Encounter  Procedures   CBC with Differential/Platelet    Standing Status:   Future    Standing Expiration Date:   06/03/2023   Comprehensive metabolic panel    Standing Status:   Future    Standing Expiration Date:   06/02/2023   All questions were answered. The patient knows to call the clinic with any problems, questions or concerns. Patient's daughter request virtual visit follow-ups due to patient's mobility issue..  She understands  limitation of virtual visits.  Follow-up plan, 6 months, lab and virtual follow-up. Earlie Server, MD, PhD [facility] Hematology Oncology 06/02/2022   INTERVAL HISTORY Shawna [name] is a 86 y.o. female who has above history reviewed by me today presents virtually for follow up visit for management of follow-up for thrombocytopenia Patient had a blood work done since last visit.  Presents to discuss results.  No new complaints.  Review of Systems  Constitutional:  Positive for fatigue. Negative for appetite change, chills and fever.  HENT:   Negative for hearing loss and voice change.   Eyes:  Negative for eye problems.  Respiratory:  Negative for chest tightness and cough.   Cardiovascular:  Negative for chest pain.  Gastrointestinal:  Negative for abdominal distention, abdominal pain and blood in stool.  Endocrine: Negative for hot flashes.  Genitourinary:  Negative for difficulty urinating and frequency.   Musculoskeletal:  Negative for arthralgias.       Right hemiaplasia   Skin:  Negative for itching and rash.  Neurological:  Negative for extremity weakness.  Hematological:  Negative for adenopathy.  Psychiatric/Behavioral:  Negative for confusion.     Past Medical History:  Diagnosis Date   Cerebral infarction ([place])    CKD (chronic kidney disease), stage III (HCC)    Depression    DJD (degenerative joint disease)    Edema    High cholesterol    Hyperlipidemia, unspecified    Hypertension    Hypothyroidism    Malaise    Morbid obesity ([place])    Osteoarthritis    Parathyroid abnormality (HCC)    Seizures ([place])    a. following remote stroke.   Stroke [facility]) 2002, 05/2017, 01/26/18, 01/30/18   a. 1964-->residual right arm wkns.  Previously  on coumadin - pt says for just a few yrs.   Subdural hematoma ([place])    a. 10/2001 [facility] req temporal frontoparietal craniotomy following fall. ? whether or not pt on coumadin @ time.  Notes indicate yes but pt denies.   Thyroid disease     TMJ (dislocation of temporomandibular joint)    Tuberculosis    a. ~ 1950   Urinary incontinence    Weight loss    Past Surgical History:  Procedure Laterality Date   ABDOMINAL HYSTERECTOMY     BACK SURGERY     BRAIN SURGERY     cataract surgery     2014   LEFT HEART CATH AND CORONARY ANGIOGRAPHY N/A 06/13/2017   Procedure: LEFT HEART CATH AND CORONARY ANGIOGRAPHY;  Surgeon: Wellington Hampshire, MD;  Location: [facility];  Service: Cardiovascular;  Laterality: N/A;   PARATHYROID ADENOMA REMOVAL     TEE WITHOUT CARDIOVERSION N/A 01/31/2018   Procedure: TRANSESOPHAGEAL ECHOCARDIOGRAM (TEE);  Surgeon: Nelva Bush, MD;  Location: [facility] ORS;  Service: Cardiovascular;  Laterality: N/A;   TEE WITHOUT CARDIOVERSION N/A 02/01/2018   Procedure: TRANSESOPHAGEAL ECHOCARDIOGRAM (TEE);  Surgeon: Minna Merritts, MD;  Location: [facility] ORS;  Service: Cardiovascular;  Laterality: N/A;    Family History  Problem Relation Age of Onset   Heart failure Mother        died @ 22   Heart attack Father        died @ 65   Stroke Father    Hypertension Sister    Diabetes Sister    Hypertension Brother    Diabetes Brother    Breast cancer Neg Hx     Social History   Socioeconomic History   Marital status: Married    Spouse name: Jeneen Rinks   Number of children: 2   Years of education: BS   Highest education level: Not on file  Occupational History   Not on file  Tobacco Use   Smoking status: Never   Smokeless tobacco: Never  Vaping Use   Vaping Use: Never used  Substance and Sexual Activity   Alcohol use: No   Drug use: No   Sexual activity: Not on file  Other Topics Concern   Not on file  Social History Narrative   [place].  Active around the house.  Does not routinely exercise or drive.  Does own grocery shopping.  10/18/2018 residing at [facility] Strain: Low Risk  (01/29/2019)    Overall Financial Resource Strain (CARDIA)    Difficulty of Paying Living Expenses: Not hard at all  Food Insecurity: Unknown (01/29/2019)   Hunger Vital Sign    Worried About Running Out of Food in the Last Year: Patient refused    [place] in the Last Year: Patient refused  Transportation Needs: Unknown (01/29/2019)   PRAPARE - Hydrologist (Medical): Patient refused    Lack of Transportation (Non-Medical): Patient refused  Physical Activity: Unknown (01/29/2019)   Exercise Vital Sign    Days of Exercise per Week: Patient refused    Minutes of Exercise per Session: Patient refused  Stress: Not on file  Social Connections: Unknown (01/29/2019)   Social Connection and Isolation Panel [NHANES]    Frequency of Communication with Friends and Family: Patient refused    Frequency of Social Gatherings with Friends and Family: Patient refused  Attends Religious Services: Patient refused    Active Member of Clubs or Organizations: Patient refused    Attends Archivist Meetings: Patient refused    Marital Status: Patient refused  Intimate Partner Violence: Not At Risk (01/29/2019)   Humiliation, Afraid, Rape, and Kick questionnaire    Fear of Current or Ex-Partner: No    Emotionally Abused: No    Physically Abused: No    Sexually Abused: No    Current Outpatient Medications on File Prior to Visit  Medication Sig Dispense Refill   atorvastatin (LIPITOR) 80 MG tablet Take 1 tablet (80 mg total) by mouth daily at 6 PM. 30 tablet 2   CITALOPRAM HYDROBROMIDE PO Take 10 mg by mouth daily.     clopidogrel (PLAVIX) 75 MG tablet Take 1 tablet (75 mg total) by mouth daily. 30 tablet 2   divalproex (DEPAKOTE SPRINKLE) 125 MG capsule Take 125 mg by mouth 2 (two) times daily.     hydrochlorothiazide (MICROZIDE) 12.5 MG capsule Take 12.5 mg by mouth daily.     latanoprost (XALATAN) 0.005 % ophthalmic solution Place 1 drop into both eyes at bedtime.   3    levothyroxine (SYNTHROID) 100 MCG tablet Take 100 mcg by mouth daily.     lisinopril (ZESTRIL) 20 MG tablet Take 20 mg by mouth daily.     tamsulosin (FLOMAX) 0.4 MG CAPS capsule Take 1 capsule (0.4 mg total) by mouth daily. 30 capsule 1   traMADol (ULTRAM) 50 MG tablet Take 50 mg by mouth every 8 (eight) hours as needed for moderate pain.     acetaminophen (TYLENOL) 325 MG tablet Take 650 mg by mouth every 4 (four) hours as needed. (Patient not taking: Reported on 06/02/2022)     bisacodyl (DULCOLAX) 10 MG suppository Place 10 mg rectally as needed for moderate constipation. (Patient not taking: Reported on 06/02/2022)     hydrocortisone 2.5 % cream Apply topically. (Patient not taking: Reported on 06/02/2022)     magnesium hydroxide (MILK OF MAGNESIA) 400 MG/5ML suspension Take 30 mLs by mouth daily as needed for mild constipation. (Patient not taking: Reported on 06/02/2022)     Menthol, Topical Analgesic, (BIOFREEZE) 4 % GEL Apply 1 application topically 3 (three) times daily. (Patient not taking: Reported on 06/02/2022)     NYAMYC powder Apply topically. (Patient not taking: Reported on 06/02/2022)     polyethylene glycol (MIRALAX / GLYCOLAX) packet Take 17 g by mouth daily.  (Patient not taking: Reported on 06/02/2022)     No current facility-administered medications on file prior to visit.    No Known Allergies     Observations/Objective: Today's Vitals   06/02/22 1412  PainSc: 1    There is no height or weight on file to calculate BMI.  Physical Exam Constitutional:      Comments: Patient lies in her bed.  No acute distress.      Labs    Latest Ref Rng & Units 04/26/2022   11:32 AM 01/30/2019    5:59 AM 01/29/2019    5:50 AM  CBC  WBC 4.0 - 10.5 K/uL 6.1  8.2  7.2   Hemoglobin 12.0 - 15.0 g/dL 13.1  12.8  11.5   Hematocrit 36.0 - 46.0 % 40.1  39.8  34.8   Platelets 150 - 400 K/uL 104  161  147       Latest Ref Rng & Units 04/26/2022   11:32 AM 01/30/2019    5:59 AM 01/29/2019  5:50 AM  CMP  Glucose 70 - 99 mg/dL 141  116  101   BUN 8 - 23 mg/dL 18  15  25    Creatinine 0.44 - 1.00 mg/dL 0.58  0.82  1.09   Sodium 135 - 145 mmol/L 139  138  137   Potassium 3.5 - 5.1 mmol/L 3.5  3.6  3.8   Chloride 98 - 111 mmol/L 102  99  98   CO2 22 - 32 mmol/L 30  27  29    Calcium 8.9 - 10.3 mg/dL 9.0  9.1  8.4   Total Protein 6.5 - 8.1 g/dL 6.6     Total Bilirubin 0.3 - 1.2 mg/dL 1.0     Alkaline Phos 38 - 126 U/L 66     AST 15 - 41 U/L 21     ALT 0 - 44 U/L 14

## 2022-06-02 NOTE — Telephone Encounter (Signed)
Called and spoke to Palmer at [facility] and gave her verbal orders for CMP and CBC to be drawn prior to pt's virtual visit in April. She said they would draw and fax results to [place].

## 2022-06-02 NOTE — Assessment & Plan Note (Signed)
Patient is on levothyroxine replacement.  I recommend patient to discuss with primary care provider regarding dosage adjustment.

## 2022-06-02 NOTE — Assessment & Plan Note (Signed)
Acute on chronic thrombocytopenia. Reviewed and discussed with patient. CBC showed a platelet count of 104000, this is at her baseline. Adequate vitamin B12, folate, negative peripheral blood flow cytometry.  Negative M protein on SPEP, slightly nonspecific increased light chain ratio.  24-hour urine protein electrophoresis negative M protein. Increased immature platelet count. Discussed with patient and daughter that thrombocytopenia is stable and is likely secondary to reactive process, drug side effects/ITP.  I recommend observation.

## 2022-06-09 ENCOUNTER — Encounter: Payer: Self-pay | Admitting: Oncology

## 2022-06-10 NOTE — Telephone Encounter (Signed)
Called and spoke to [employer] and informed her of MD response as well. Per Christ Kick had communicated information with her as well. Asked Crystal to call us and let us know when appt will be scheduled so we can set up lab appt. As far as Plavix, Dr. Unk Lightning is managing so she will check with her.

## 2022-06-14 ENCOUNTER — Encounter: Payer: Self-pay | Admitting: Student

## 2022-06-14 ENCOUNTER — Non-Acute Institutional Stay (SKILLED_NURSING_FACILITY): Payer: Medicare PPO | Admitting: Student

## 2022-06-14 DIAGNOSIS — F325 Major depressive disorder, single episode, in full remission: Secondary | ICD-10-CM

## 2022-06-14 DIAGNOSIS — Z789 Other specified health status: Secondary | ICD-10-CM

## 2022-06-14 DIAGNOSIS — M1990 Unspecified osteoarthritis, unspecified site: Secondary | ICD-10-CM | POA: Diagnosis not present

## 2022-06-14 DIAGNOSIS — I5032 Chronic diastolic (congestive) heart failure: Secondary | ICD-10-CM | POA: Diagnosis not present

## 2022-06-14 DIAGNOSIS — F0150 Vascular dementia without behavioral disturbance: Secondary | ICD-10-CM | POA: Diagnosis not present

## 2022-06-14 DIAGNOSIS — E034 Atrophy of thyroid (acquired): Secondary | ICD-10-CM

## 2022-06-14 DIAGNOSIS — F418 Other specified anxiety disorders: Secondary | ICD-10-CM | POA: Diagnosis not present

## 2022-06-14 DIAGNOSIS — I110 Hypertensive heart disease with heart failure: Secondary | ICD-10-CM | POA: Diagnosis not present

## 2022-06-14 DIAGNOSIS — D696 Thrombocytopenia, unspecified: Secondary | ICD-10-CM | POA: Diagnosis not present

## 2022-06-14 DIAGNOSIS — R339 Retention of urine, unspecified: Secondary | ICD-10-CM

## 2022-06-14 NOTE — Progress Notes (Signed)
Location:  Other [facility]) Nursing Home Room Number: 407-A Place of Service:  SNF [phone]) Provider:  Earnestine Mealing, MD  Patient Care Team: Earnestine Mealing, MD as PCP - General (Family Medicine) Mariah Milling, Tollie Pizza, MD as Consulting Physician (Cardiology) Sharee Holster, NP as Nurse Practitioner (Geriatric Medicine)  Extended Emergency Contact Information Primary Emergency Contact: Moorhouse,[name] Address: [address]          [place], [place] [address] D[place] of [place] Phone: [phone] Relation: Daughter Secondary Emergency Contact: Freda Munro Address: [address]          [place], [place] [address] [place] of [place] Home Phone: [phone] Mobile Phone: [phone] Relation: Spouse  Code Status:  DNR Goals of care: Advanced Directive information    06/14/2022    1:42 PM  Advanced Directives  Does Patient Have a Medical Advance Directive? Yes  Type of Advance Directive Out of facility DNR (pink MOST or yellow form)  Does patient want to make changes to medical advance directive? No - Patient declined  Copy of Healthcare Power of Attorney in Chart? Yes - validated most recent copy scanned in chart (See row information)  Pre-existing out of facility DNR order (yellow form or pink MOST form) Yellow form placed in chart (order not valid for inpatient use);Pink MOST form placed in chart (order not valid for inpatient use)     Chief Complaint  Patient presents with   Medical Management of Chronic Issues    Routine visit. Discuss need for shingrix, pneumonia and flu vaccines or post pone if patient refuses. Vitals and medications are a reflection of [facility]MR system, [employer] [facility]      HPI:  Pt is a 86 y.o. female seen today for medical management of chronic diseases.  Patient states she is doing well today. She is happy to see me. Says over and over I need to get my money so I can pay you.   Asked her name and she  told me. Unable to give her birth date. States the current year is 56. She was a Pension scheme manager for "too many" years. She has been married to her husband for "too many" years. 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 Her husband is living. She has three children, Jess Barters, and can't remember the third one.   Messaged with hemetologist who states PLT >50k is safe for oral surgery  Attempted call 3x with oral surgeon without success.    Past Medical History:  Diagnosis Date   Cerebral infarction (HCC)    CKD (chronic kidney disease), stage III (HCC)    Depression    DJD (degenerative joint disease)    Edema    High cholesterol    Hyperlipidemia, unspecified    Hypertension    Hypothyroidism    Malaise    Morbid obesity (HCC)    Osteoarthritis    Parathyroid abnormality (HCC)    Seizures (HCC)    a. following remote stroke.   Stroke [facility]) 2002, 05/2017, 01/26/18, 01/30/18   a. 1964-->residual right arm wkns.  Previously on coumadin - pt says for just a few yrs.   Subdural hematoma (HCC)    a. 10/2001 SDH req temporal frontoparietal craniotomy following fall. ? whether or not pt on coumadin @ time.  Notes indicate yes but pt denies.   Thyroid disease    TMJ (dislocation of temporomandibular joint)    Tuberculosis    a. ~ 1950  Urinary incontinence    Weight loss    Past Surgical History:  Procedure Laterality Date   ABDOMINAL HYSTERECTOMY     BACK SURGERY     BRAIN SURGERY     cataract surgery     2014   LEFT HEART CATH AND CORONARY ANGIOGRAPHY N/A 06/13/2017   Procedure: LEFT HEART CATH AND CORONARY ANGIOGRAPHY;  Surgeon: Iran Ouch, MD;  Location: [facility] INVASIVE CV LAB;  Service: Cardiovascular;  Laterality: N/A;   PARATHYROID ADENOMA REMOVAL     TEE WITHOUT CARDIOVERSION N/A 01/31/2018   Procedure: TRANSESOPHAGEAL ECHOCARDIOGRAM (TEE);  Surgeon: Yvonne Kendall, MD;  Location: [facility] ORS;  Service: Cardiovascular;  Laterality: N/A;   TEE WITHOUT  CARDIOVERSION N/A 02/01/2018   Procedure: TRANSESOPHAGEAL ECHOCARDIOGRAM (TEE);  Surgeon: Antonieta Iba, MD;  Location: [facility] ORS;  Service: Cardiovascular;  Laterality: N/A;    No Known Allergies  Outpatient Encounter Medications as of 06/14/2022  Medication Sig   acetaminophen (TYLENOL) 325 MG tablet Take 650 mg by mouth every 4 (four) hours as needed. See other listing   acetaminophen (TYLENOL) 325 MG tablet Take 650 mg by mouth 3 (three) times daily. Scheduled, see as needed listing   alum & mag hydroxide-simeth (MAALOX/MYLANTA) 200-200-20 MG/5ML suspension Take 30 mLs by mouth every 4 (four) hours as needed for indigestion or heartburn.   atorvastatin (LIPITOR) 80 MG tablet Take 1 tablet (80 mg total) by mouth daily at 6 PM.   bisacodyl (DULCOLAX) 10 MG suppository Place 10 mg rectally as needed for moderate constipation.   citalopram (CELEXA) 10 MG tablet Take 10 mg by mouth daily.   clopidogrel (PLAVIX) 75 MG tablet Take 1 tablet (75 mg total) by mouth daily.   divalproex (DEPAKOTE SPRINKLE) 125 MG capsule Take 125 mg by mouth 2 (two) times daily.   hydrochlorothiazide (MICROZIDE) 12.5 MG capsule Take 12.5 mg by mouth daily.   hydrocortisone 2.5 % cream Apply 1 Application topically every 8 (eight) hours as needed (for hemorrhoids).   Infant Care Products [facility]) OINT Apply 1 application  topically every 12 (twelve) hours as needed (apply to axilla, groin, and under breast).   latanoprost (XALATAN) 0.005 % ophthalmic solution Place 1 drop into both eyes at bedtime.    levothyroxine (SYNTHROID) 88 MCG tablet Take 88 mcg by mouth daily. At bedtime   lisinopril (ZESTRIL) 20 MG tablet Take 20 mg by mouth daily.   magnesium hydroxide (MILK OF MAGNESIA) 400 MG/5ML suspension Take 30 mLs by mouth every 12 (twelve) hours as needed for mild constipation.   Menthol, Topical Analgesic, (BIOFREEZE) 4 % GEL Apply 1 Application topically every 8 (eight) hours as needed (For knee pain, apply to  both knees).   NYAMYC powder Apply 1 Application topically every 12 (twelve) hours as needed (for skin breakdown, apply under breast).   polyethylene glycol (MIRALAX / GLYCOLAX) packet Take 17 g by mouth daily as needed.   tamsulosin (FLOMAX) 0.4 MG CAPS capsule Take 1 capsule (0.4 mg total) by mouth daily.   traMADol (ULTRAM) 50 MG tablet Take 50 mg by mouth every 8 (eight) hours as needed for moderate pain.   [DISCONTINUED] CITALOPRAM HYDROBROMIDE PO Take 10 mg by mouth daily.   [DISCONTINUED] levothyroxine (SYNTHROID) 100 MCG tablet Take 100 mcg by mouth daily.   [DISCONTINUED] Menthol, Topical Analgesic, (BIOFREEZE) 4 % GEL Apply 1 application topically 3 (three) times daily. (Patient not taking: Reported on 06/02/2022)   No facility-administered encounter medications on file as of 06/14/2022.  Review of Systems  All other systems reviewed and are negative.   Immunization History  Administered Date(s) Administered   Influenza Split 07/18/2014, 07/23/2015   Influenza, High Dose Seasonal PF 06/14/2017   Influenza-Unspecified 05/25/2012, 05/31/2016, 06/01/2018, 06/21/2021   Moderna Covid-19 Vaccine Bivalent Booster 80yrs & up 01/26/2022   Moderna SARS-COV2 Booster Vaccination 07/11/2020, 01/16/2021   Moderna Sars-Covid-2 Vaccination 09/19/2019, 10/17/2019   Pfizer Covid-19 Vaccine Bivalent Booster 48yrs & up 05/22/2021   Pneumococcal Polysaccharide-23 08/30/2004, 08/04/2017, 02/15/2019   Pertinent  Health Maintenance Due  Topic Date Due   INFLUENZA VACCINE  03/30/2022   DEXA SCAN  Discontinued      01/29/2019   10:30 PM 01/30/2019    2:00 PM 04/26/2022   10:00 AM 05/19/2022    9:12 AM 06/02/2022    2:12 PM  Fall Risk  Falls in the past year?    0   Was there an injury with Fall?    0   Fall Risk Category Calculator    0   Fall Risk Category    Low   Patient Fall Risk Level High fall risk High fall risk High fall risk Moderate fall risk High fall risk  Patient at Risk for Falls  Due to    History of fall(s)   Fall risk Follow up    Falls evaluation completed    Functional Status Survey:    Vitals:   06/14/22 1133  BP: (!) 177/79  Pulse: 61  Temp: (!) 94 F (34.4 C)  Weight: 159 lb (72.1 kg)  Height: 5\' 4"  (1.626 m)   Body mass index is 27.29 kg/m. Physical Exam Cardiovascular:     Rate and Rhythm: Normal rate.     Pulses: Normal pulses.  Pulmonary:     Effort: Pulmonary effort is normal.     Breath sounds: Normal breath sounds.  Abdominal:     General: Bowel sounds are normal.     Palpations: Abdomen is soft.  Skin:    General: Skin is warm and dry.     Comments: Gluteal cleft appears normal without signs of SSTI (no induration, erythema, ttp).  Neurological:     Mental Status: She is alert. Mental status is at baseline. She is disoriented.  Psychiatric:     Comments: Patient becomes tearful with movements and screams, "my ass" with each transfer.      Labs reviewed: Recent Labs    12/28/21 0000 04/26/22 1132  NA 141 139  K 3.7 3.5  CL 102 102  CO2 33* 30  GLUCOSE  --  141*  BUN 15 18  CREATININE 0.6 0.58  CALCIUM 9.4 9.0   Recent Labs    12/28/21 0000 04/26/22 1132  AST 15 21  ALT 12 14  ALKPHOS 62 66  BILITOT  --  1.0  PROT  --  6.6  ALBUMIN 3.6 3.7   Recent Labs    03/25/22 0000 04/19/22 0000 04/26/22 1132  WBC 5.9 7.7 6.1  NEUTROABS  --   --  3.9  HGB 12.1 13.0 13.1  HCT 37 39 40.1  MCV  --   --  94.8  PLT 87* 94* 104*   Lab Results  Component Value Date   TSH 0.229 (L) 04/26/2022   Lab Results  Component Value Date   HGBA1C 5.6 01/27/2018   Lab Results  Component Value Date   CHOL 149 01/28/2018   HDL 40 (L) 01/28/2018   LDLCALC 81 01/28/2018   TRIG 139 01/28/2018  CHOLHDL 3.7 01/28/2018    Significant Diagnostic Results in last 30 days:  No results found.  Assessment/Plan 1. Dementia, multiinfarct, without behavioral disturbance (HCC) Patient at her baseline mental status oriented to self,  location of [place], however and able to clearly state the year.  Of note, patient has lost 7 lbs in the last year. Nutrition aware and conctinues to offer support. Per nursing and patient, normal appetite.   2. Thrombocytopenia (HCC) Patient awaiting tooth extraction. Platelets remain >50 per myself and hemetologist okay to proceed with procedure.   3. Hypertensive heart disease with chronic diastolic congestive heart failure (HCC) BP Typically well- controlled with >75% within goal range 120/80-150/90. Continue Zestril  4. . Major depression in remission (HCC) Mood is difficult to assess based on patients level of communicatoin. Dosing appropriate for medications. Will continue Celexa 10 mg at this time. Continue Depakote 125 mg bid  5.  Chronic diastolic congestive heart failure (HCC) Patient appears euvolemic on exam with clear lungs and no leg swelling.   6. Resides in skilled nursing facility Due to mobility limitations after her stroke, patient continues to require support   7. Chronic urinary retention Continue Flomax 0.4 mg nightly  8. Arthritis Continue Tramadol and Tylenol PRN.   Family/ staff Communication: nursing staff  Labs/tests ordered:  November labs cbc and bmp  Coralyn Helling, MD, [facility] [phone]

## 2022-06-15 ENCOUNTER — Encounter: Payer: Self-pay | Admitting: Student

## 2022-06-15 NOTE — Telephone Encounter (Signed)
Message routed to PCP Beamer, Victoria, MD  

## 2022-06-30 DIAGNOSIS — E785 Hyperlipidemia, unspecified: Secondary | ICD-10-CM | POA: Diagnosis not present

## 2022-06-30 DIAGNOSIS — E039 Hypothyroidism, unspecified: Secondary | ICD-10-CM | POA: Diagnosis not present

## 2022-06-30 LAB — LIPID PANEL
Cholesterol: 146 (ref 0–200)
HDL: 42 (ref 35–70)
LDL Cholesterol: 79
LDl/HDL Ratio: 3.5
Triglycerides: 155 (ref 40–160)

## 2022-06-30 LAB — TSH: TSH: 1.15 (ref 0.41–5.90)

## 2022-07-01 ENCOUNTER — Encounter: Payer: Self-pay | Admitting: Nurse Practitioner

## 2022-07-01 ENCOUNTER — Non-Acute Institutional Stay (SKILLED_NURSING_FACILITY): Payer: Medicare PPO | Admitting: Nurse Practitioner

## 2022-07-01 DIAGNOSIS — L602 Onychogryphosis: Secondary | ICD-10-CM

## 2022-07-01 NOTE — Progress Notes (Signed)
Location:   [facility] Room Number: 407 A Place of Service:  SNF [phone]) Provider:  Abbey Chatters, NP  Earnestine Mealing, MD  Patient Care Team: Earnestine Mealing, MD as PCP - General (Family Medicine) Antonieta Iba, MD as Consulting Physician (Cardiology) Sharee Holster, NP as Nurse Practitioner (Geriatric Medicine)  Extended Emergency Contact Information Primary Emergency Contact: Favia,[name] Address: [address]          [place], [place] [address] Darden [place] of [place] Phone: [phone] Relation: Daughter Secondary Emergency Contact: Freda Munro Address: [address]          [place], [place] [address] Darden Amber of [place] Home Phone: [phone] Mobile Phone: [phone] Relation: Spouse  Code Status:  DNR Goals of care: Advanced Directive information    07/01/2022   12:08 PM  Advanced Directives  Does Patient Have a Medical Advance Directive? Yes  Type of Advance Directive Out of facility DNR (pink MOST or yellow form)  Does patient want to make changes to medical advance directive? No - Patient declined  Pre-existing out of facility DNR order (yellow form or pink MOST form) Pink MOST form placed in chart (order not valid for inpatient use);Yellow form placed in chart (order not valid for inpatient use)     Chief Complaint  Patient presents with   Acute Visit    Toenail trim    HPI:  Pt is a 85 y.o. female seen today for an acute visit for overgrown toenails that staff requesting to be trimmed.    Past Medical History:  Diagnosis Date   Cerebral infarction [facility])    CKD (chronic kidney disease), stage III (HCC)    Depression    DJD (degenerative joint disease)    Edema    High cholesterol    Hyperlipidemia, unspecified    Hypertension    Hypothyroidism    Lower GI bleed 01/31/2019   Last Assessment & Plan:  Formatting of this note might be different from the original. Noted  several episodes here and at [facility]. Was on dual antiplatelets and still is but asa dose was lowered. She is doing well now and in good spirits today in her room in isolation given covid. No abd pain and eating well with normal bm's per staff.   Malaise    Morbid obesity (HCC)    Osteoarthritis    Parathyroid abnormality (HCC)    Seizures (HCC)    a. following remote stroke.   Stroke [facility]) 2002, 05/2017, 01/26/18, 01/30/18   a. 1964-->residual right arm wkns.  Previously on coumadin - pt says for just a few yrs.   Subdural hematoma (HCC)    a. 10/2001 [facility] req temporal frontoparietal craniotomy following fall. ? whether or not pt on coumadin @ time.  Notes indicate yes but pt denies.   Thyroid disease    TMJ (dislocation of temporomandibular joint)    Tuberculosis    a. ~ 1950   Urinary incontinence    Weight loss    Past Surgical History:  Procedure Laterality Date   ABDOMINAL HYSTERECTOMY     BACK SURGERY     BRAIN SURGERY     cataract surgery     2014   LEFT HEART CATH AND CORONARY ANGIOGRAPHY N/A 06/13/2017   Procedure: LEFT HEART CATH AND CORONARY ANGIOGRAPHY;  Surgeon: Iran Ouch, MD;  Location: [facility] INVASIVE CV LAB;  Service: Cardiovascular;  Laterality: N/A;   PARATHYROID ADENOMA REMOVAL  TEE WITHOUT CARDIOVERSION N/A 01/31/2018   Procedure: TRANSESOPHAGEAL ECHOCARDIOGRAM (TEE);  Surgeon: Nelva Bush, MD;  Location: [facility] ORS;  Service: Cardiovascular;  Laterality: N/A;   TEE WITHOUT CARDIOVERSION N/A 02/01/2018   Procedure: TRANSESOPHAGEAL ECHOCARDIOGRAM (TEE);  Surgeon: Minna Merritts, MD;  Location: [facility] ORS;  Service: Cardiovascular;  Laterality: N/A;    No Known Allergies  Allergies as of 07/01/2022   No Known Allergies      Medication List        Accurate as of July 01, 2022 12:09 PM. If you have any questions, ask your nurse or doctor.          acetaminophen 325 MG tablet Commonly known as: TYLENOL Take 650 mg by mouth every 4 (four) hours  as needed. See other listing   acetaminophen 325 MG tablet Commonly known as: TYLENOL Take 650 mg by mouth 3 (three) times daily. Scheduled, see as needed listing   alum & mag hydroxide-simeth 200-200-20 MG/5ML suspension Commonly known as: MAALOX/MYLANTA Take 30 mLs by mouth every 4 (four) hours as needed for indigestion or heartburn.   atorvastatin 80 MG tablet Commonly known as: LIPITOR Take 1 tablet (80 mg total) by mouth daily at 6 PM.   Biofreeze 4 % Gel Generic drug: Menthol (Topical Analgesic) Apply 1 Application topically every 8 (eight) hours as needed (For knee pain, apply to both knees).   bisacodyl 10 MG suppository Commonly known as: DULCOLAX Place 10 mg rectally as needed for moderate constipation.   citalopram 10 MG tablet Commonly known as: CELEXA Take 10 mg by mouth daily.   clopidogrel 75 MG tablet Commonly known as: PLAVIX Take 1 tablet (75 mg total) by mouth daily.   Dermacloud Oint Apply 1 application  topically every 12 (twelve) hours as needed (apply to axilla, groin, and under breast).   divalproex 125 MG capsule Commonly known as: DEPAKOTE SPRINKLE Take 125 mg by mouth 2 (two) times daily.   hydrochlorothiazide 12.5 MG capsule Commonly known as: MICROZIDE Take 12.5 mg by mouth daily.   hydrocortisone 2.5 % cream Apply 1 Application topically every 8 (eight) hours as needed (for hemorrhoids).   latanoprost 0.005 % ophthalmic solution Commonly known as: XALATAN Place 1 drop into both eyes at bedtime.   levothyroxine 88 MCG tablet Commonly known as: SYNTHROID Take 88 mcg by mouth daily. At bedtime   lisinopril 20 MG tablet Commonly known as: ZESTRIL Take 20 mg by mouth daily.   magnesium hydroxide 400 MG/5ML suspension Commonly known as: MILK OF MAGNESIA Take 30 mLs by mouth every 12 (twelve) hours as needed for mild constipation.   Nyamyc powder Generic drug: nystatin Apply 1 Application topically every 12 (twelve) hours as needed  (for skin breakdown, apply under breast).   polyethylene glycol 17 g packet Commonly known as: MIRALAX / GLYCOLAX Take 17 g by mouth daily as needed.   tamsulosin 0.4 MG Caps capsule Commonly known as: Flomax Take 1 capsule (0.4 mg total) by mouth daily.   traMADol 50 MG tablet Commonly known as: ULTRAM Take 50 mg by mouth every 8 (eight) hours as needed for moderate pain.        Review of Systems  Unable to perform ROS: Dementia    Immunization History  Administered Date(s) Administered   Influenza Split 07/18/2014, 07/23/2015   Influenza, High Dose Seasonal PF 06/14/2017   Influenza-Unspecified 05/25/2012, 05/31/2016, 06/01/2018, 06/21/2021   Moderna Covid-19 Vaccine Bivalent Booster 76yrs & up 01/26/2022   Moderna SARS-COV2 Booster Vaccination 07/11/2020,  01/16/2021   Moderna Sars-Covid-2 Vaccination 09/19/2019, 10/17/2019   Pfizer Covid-19 Vaccine Bivalent Booster 45yrs & up 05/22/2021   Pneumococcal Polysaccharide-23 08/30/2004, 08/04/2017, 02/15/2019   Pertinent  Health Maintenance Due  Topic Date Due   INFLUENZA VACCINE  03/30/2022   DEXA SCAN  Discontinued      01/29/2019   10:30 PM 01/30/2019    2:00 PM 04/26/2022   10:00 AM 05/19/2022    9:12 AM 06/02/2022    2:12 PM  Fall Risk  Falls in the past year?    0   Was there an injury with Fall?    0   Fall Risk Category Calculator    0   Fall Risk Category    Low   Patient Fall Risk Level High fall risk High fall risk High fall risk Moderate fall risk High fall risk  Patient at Risk for Falls Due to    History of fall(s)   Fall risk Follow up    Falls evaluation completed    Functional Status Survey:    Vitals:   07/01/22 1156  BP: 112/74  Pulse: 76  Resp: 17  Temp: (!) 97.5 F (36.4 C)  SpO2: 93%  Weight: 168 lb 12.8 oz (76.6 kg)  Height: 5\' 4"  (1.626 m)   Body mass index is 28.97 kg/m. Physical Exam Feet:     Right foot:     Toenail Condition: Right toenails are abnormally thick and long.      Left foot:     Toenail Condition: Left toenails are abnormally thick and long.  Neurological:     Mental Status: She is alert.     Labs reviewed: Recent Labs    12/28/21 0000 04/26/22 1132  NA 141 139  K 3.7 3.5  CL 102 102  CO2 33* 30  GLUCOSE  --  141*  BUN 15 18  CREATININE 0.6 0.58  CALCIUM 9.4 9.0   Recent Labs    12/28/21 0000 04/26/22 1132  AST 15 21  ALT 12 14  ALKPHOS 62 66  BILITOT  --  1.0  PROT  --  6.6  ALBUMIN 3.6 3.7   Recent Labs    03/25/22 0000 04/19/22 0000 04/26/22 1132  WBC 5.9 7.7 6.1  NEUTROABS  --   --  3.9  HGB 12.1 13.0 13.1  HCT 37 39 40.1  MCV  --   --  94.8  PLT 87* 94* 104*   Lab Results  Component Value Date   TSH 0.229 (L) 04/26/2022   Lab Results  Component Value Date   HGBA1C 5.6 01/27/2018   Lab Results  Component Value Date   CHOL 149 01/28/2018   HDL 40 (L) 01/28/2018   LDLCALC 81 01/28/2018   TRIG 139 01/28/2018   CHOLHDL 3.7 01/28/2018    Significant Diagnostic Results in last 30 days:  No results found.  Assessment/Plan 1. Overgrown nail -Consistent provided for toe nails to be trimmed, Nails sharply debrided 10 without complication/bleeding. Pt tolerated well.    03/30/2018. Janene Harvey [facility] [phone]

## 2022-07-02 ENCOUNTER — Other Ambulatory Visit: Payer: Self-pay

## 2022-07-02 DIAGNOSIS — D696 Thrombocytopenia, unspecified: Secondary | ICD-10-CM

## 2022-07-02 NOTE — Telephone Encounter (Signed)
Please call Shawna [name], to set up lab appt on 11/14 and 11/17.

## 2022-07-02 NOTE — Telephone Encounter (Signed)
Please schedule pt for 11/14 @ 1:45. Daughter is aware of date/time. No need for lab on 11/17.

## 2022-07-05 ENCOUNTER — Encounter: Payer: Self-pay | Admitting: Student

## 2022-07-05 ENCOUNTER — Encounter: Payer: Self-pay | Admitting: Nurse Practitioner

## 2022-07-09 NOTE — Telephone Encounter (Signed)
Message routed to PCP Beamer, Victoria, MD  

## 2022-07-13 ENCOUNTER — Inpatient Hospital Stay: Payer: Medicare PPO | Attending: Oncology

## 2022-07-13 DIAGNOSIS — D696 Thrombocytopenia, unspecified: Secondary | ICD-10-CM | POA: Diagnosis not present

## 2022-07-13 LAB — CBC WITH DIFFERENTIAL/PLATELET
Abs Immature Granulocytes: 0.02 10*3/uL (ref 0.00–0.07)
Basophils Absolute: 0 10*3/uL (ref 0.0–0.1)
Basophils Relative: 0 %
Eosinophils Absolute: 0.1 10*3/uL (ref 0.0–0.5)
Eosinophils Relative: 1 %
HCT: 40.7 % (ref 36.0–46.0)
Hemoglobin: 13.4 g/dL (ref 12.0–15.0)
Immature Granulocytes: 0 %
Lymphocytes Relative: 26 %
Lymphs Abs: 1.3 10*3/uL (ref 0.7–4.0)
MCH: 31.2 pg (ref 26.0–34.0)
MCHC: 32.9 g/dL (ref 30.0–36.0)
MCV: 94.9 fL (ref 80.0–100.0)
Monocytes Absolute: 0.5 10*3/uL (ref 0.1–1.0)
Monocytes Relative: 10 %
Neutro Abs: 3.2 10*3/uL (ref 1.7–7.7)
Neutrophils Relative %: 63 %
Platelets: 119 10*3/uL — ABNORMAL LOW (ref 150–400)
RBC: 4.29 MIL/uL (ref 3.87–5.11)
RDW: 13.2 % (ref 11.5–15.5)
WBC: 5.2 10*3/uL (ref 4.0–10.5)
nRBC: 0 % (ref 0.0–0.2)

## 2022-07-15 DIAGNOSIS — I10 Essential (primary) hypertension: Secondary | ICD-10-CM | POA: Diagnosis not present

## 2022-07-15 LAB — CBC AND DIFFERENTIAL
HCT: 37 (ref 36–46)
Hemoglobin: 12.3 (ref 12.0–16.0)
Neutrophils Absolute: 3046
Platelets: 108 10*3/uL — AB (ref 150–400)
WBC: 5.6

## 2022-07-15 LAB — CBC: RBC: 3.9 (ref 3.87–5.11)

## 2022-07-16 ENCOUNTER — Encounter: Payer: Self-pay | Admitting: Oncology

## 2022-08-10 ENCOUNTER — Non-Acute Institutional Stay (SKILLED_NURSING_FACILITY): Payer: Medicare Other | Admitting: Nurse Practitioner

## 2022-08-10 ENCOUNTER — Encounter: Payer: Self-pay | Admitting: Nurse Practitioner

## 2022-08-10 DIAGNOSIS — M533 Sacrococcygeal disorders, not elsewhere classified: Secondary | ICD-10-CM | POA: Diagnosis not present

## 2022-08-10 DIAGNOSIS — B351 Tinea unguium: Secondary | ICD-10-CM

## 2022-08-10 DIAGNOSIS — F325 Major depressive disorder, single episode, in full remission: Secondary | ICD-10-CM

## 2022-08-10 DIAGNOSIS — E034 Atrophy of thyroid (acquired): Secondary | ICD-10-CM

## 2022-08-10 DIAGNOSIS — L602 Onychogryphosis: Secondary | ICD-10-CM

## 2022-08-10 DIAGNOSIS — F01B18 Vascular dementia, moderate, with other behavioral disturbance: Secondary | ICD-10-CM | POA: Diagnosis not present

## 2022-08-10 DIAGNOSIS — M1990 Unspecified osteoarthritis, unspecified site: Secondary | ICD-10-CM

## 2022-08-10 DIAGNOSIS — Z8673 Personal history of transient ischemic attack (TIA), and cerebral infarction without residual deficits: Secondary | ICD-10-CM

## 2022-08-10 MED ORDER — TRAMADOL HCL 50 MG PO TABS: 50.0000 mg | ORAL_TABLET | Freq: Three times a day (TID) | ORAL | 0 refills | Status: DC | PRN

## 2022-08-10 NOTE — Progress Notes (Signed)
Location:  [facility] Room Number: 407-A Place of Service:  SNF [phone]) Provider:  Nicholes Stairs, MD  Patient Care Team: Earnestine Mealing, MD as PCP - General (Family Medicine) Mariah Milling, Tollie Pizza, MD as Consulting Physician (Cardiology) Sharee Holster, NP as Nurse Practitioner (Geriatric Medicine)  Extended Emergency Contact Information Primary Emergency Contact: Claw,[name] Address: [address]          [place], [place] [address] Darden [place] of [place] Phone: [phone] Relation: Daughter Secondary Emergency Contact: Freda Munro Address: [address]          [place], [place] [address] Darden [place] of [place] Home Phone: [phone] Mobile Phone: [phone] Relation: Spouse  Code Status:  DNR Goals of care: Advanced Directive information    08/10/2022    3:31 PM  Advanced Directives  Does Patient Have a Medical Advance Directive? Yes  Type of Estate agent of [place];Living will;Out of facility DNR (pink MOST or yellow form)  Does patient want to make changes to medical advance directive? No - Patient declined  Copy of Healthcare Power of Attorney in Chart? No - copy requested  Pre-existing out of facility DNR order (yellow form or pink MOST form) Pink MOST/Yellow Form most recent copy in chart - Physician notified to receive inpatient order     Chief Complaint  Patient presents with   Routine    HPI:  Pt is a 86 y.o. female seen today for medical management of chronic diseases.   Nursing reports her nails needs to be trimmed, has fungal disease which has caused them to be thick.  Staff reports she is crying out more often and has a hard time being redirected or calming her down.  She has tramadol which she is given thinking some of the crying out was due to pain but this is unchanged with pain medication.    Past Medical History:  Diagnosis Date   Cerebral  infarction [facility])    CKD (chronic kidney disease), stage III (HCC)    Depression    DJD (degenerative joint disease)    Edema    High cholesterol    Hyperlipidemia, unspecified    Hypertension    Hypothyroidism    Lower GI bleed 01/31/2019   Last Assessment & Plan:  Formatting of this note might be different from the original. Noted several episodes here and at [facility]. Was on dual antiplatelets and still is but asa dose was lowered. She is doing well now and in good spirits today in her room in isolation given covid. No abd pain and eating well with normal bm's per staff.   Malaise    Morbid obesity (HCC)    Osteoarthritis    Parathyroid abnormality (HCC)    Seizures (HCC)    a. following remote stroke.   Stroke [facility]) 2002, 05/2017, 01/26/18, 01/30/18   a. 1964-->residual right arm wkns.  Previously on coumadin - pt says for just a few yrs.   Subdural hematoma (HCC)    a. 10/2001 [facility] req temporal frontoparietal craniotomy following fall. ? whether or not pt on coumadin @ time.  Notes indicate yes but pt denies.   Thyroid disease    TMJ (dislocation of temporomandibular joint)    Tuberculosis    a. ~ 1950   Urinary incontinence    Weight loss    Past Surgical History:  Procedure Laterality Date   ABDOMINAL HYSTERECTOMY     BACK SURGERY  BRAIN SURGERY     cataract surgery     2014   LEFT HEART CATH AND CORONARY ANGIOGRAPHY N/A 06/13/2017   Procedure: LEFT HEART CATH AND CORONARY ANGIOGRAPHY;  Surgeon: Iran OuchArida, Muhammad A, MD;  Location: [facility] INVASIVE CV LAB;  Service: Cardiovascular;  Laterality: N/A;   PARATHYROID ADENOMA REMOVAL     TEE WITHOUT CARDIOVERSION N/A 01/31/2018   Procedure: TRANSESOPHAGEAL ECHOCARDIOGRAM (TEE);  Surgeon: Yvonne KendallEnd, Christopher, MD;  Location: [facility] ORS;  Service: Cardiovascular;  Laterality: N/A;   TEE WITHOUT CARDIOVERSION N/A 02/01/2018   Procedure: TRANSESOPHAGEAL ECHOCARDIOGRAM (TEE);  Surgeon: Antonieta IbaGollan, Timothy J, MD;  Location: [facility] ORS;  Service:  Cardiovascular;  Laterality: N/A;    No Known Allergies  Outpatient Encounter Medications as of 08/10/2022  Medication Sig   acetaminophen (TYLENOL) 325 MG tablet Take 650 mg by mouth every 4 (four) hours as needed. See other listing   acetaminophen (TYLENOL) 325 MG tablet Take 650 mg by mouth 3 (three) times daily. Scheduled, see as needed listing   alum & mag hydroxide-simeth (MAALOX/MYLANTA) 200-200-20 MG/5ML suspension Take 30 mLs by mouth every 4 (four) hours as needed for indigestion or heartburn.   atorvastatin (LIPITOR) 80 MG tablet Take 1 tablet (80 mg total) by mouth daily at 6 PM.   bisacodyl (DULCOLAX) 10 MG suppository Place 10 mg rectally as needed for moderate constipation.   citalopram (CELEXA) 10 MG tablet Take 10 mg by mouth daily.   clopidogrel (PLAVIX) 75 MG tablet Take 1 tablet (75 mg total) by mouth daily.   divalproex (DEPAKOTE SPRINKLE) 125 MG capsule Take 125 mg by mouth 2 (two) times daily.   guaiFENesin (TUSSIN PO) Take 10 mLs by mouth every 4 (four) hours as needed (cough).   hydrochlorothiazide (MICROZIDE) 12.5 MG capsule Take 12.5 mg by mouth daily.   hydrocortisone 2.5 % cream Apply 1 Application topically every 8 (eight) hours as needed (for hemorrhoids).   Infant Care Products [facility](DERMACLOUD) OINT Apply 1 application  topically every 12 (twelve) hours as needed (apply to axilla, groin, and under breast).   latanoprost (XALATAN) 0.005 % ophthalmic solution Place 1 drop into both eyes at bedtime.    levothyroxine (SYNTHROID) 88 MCG tablet Take 88 mcg by mouth daily. At bedtime   lisinopril (ZESTRIL) 20 MG tablet Take 20 mg by mouth daily.   magnesium hydroxide (MILK OF MAGNESIA) 400 MG/5ML suspension Take 30 mLs by mouth every 12 (twelve) hours as needed for mild constipation.   Menthol, Topical Analgesic, (BIOFREEZE) 4 % GEL Apply 1 Application topically every 8 (eight) hours as needed (For knee pain, apply to both knees).   NYAMYC powder Apply 1 Application  topically every 12 (twelve) hours as needed (for skin breakdown, apply under breast).   polyethylene glycol (MIRALAX / GLYCOLAX) packet Take 17 g by mouth daily as needed.   tamsulosin (FLOMAX) 0.4 MG CAPS capsule Take 1 capsule (0.4 mg total) by mouth daily.   TRAMADOL HCL PO Take 50 mg by mouth in the morning and at bedtime. Give 1 tablet by mouth every 6 hours as needed for increased pain   [DISCONTINUED] traMADol (ULTRAM) 50 MG tablet Take 1 tablet (50 mg total) by mouth every 8 (eight) hours as needed for moderate pain. (Patient taking differently: Take 50 mg by mouth every 6 (six) hours as needed for moderate pain. Give 1 tablet by mouth two times a day for pain)   [DISCONTINUED] traMADol (ULTRAM) 50 MG tablet Take 50 mg by mouth every 8 (eight) hours  as needed for moderate pain.   No facility-administered encounter medications on file as of 08/10/2022.    Review of Systems  Unable to perform ROS: Dementia    Immunization History  Administered Date(s) Administered   Influenza Split 07/18/2014, 07/23/2015   Influenza, High Dose Seasonal PF 06/14/2017   Influenza-Unspecified 05/25/2012, 05/31/2016, 06/01/2018, 06/21/2021   Moderna Covid-19 Vaccine Bivalent Booster 20yrs & up 01/26/2022   Moderna SARS-COV2 Booster Vaccination 07/11/2020, 01/16/2021   Moderna Sars-Covid-2 Vaccination 09/19/2019, 10/17/2019   Pfizer Covid-19 Vaccine Bivalent Booster 67yrs & up 05/22/2021   Pneumococcal Polysaccharide-23 08/30/2004, 08/04/2017, 02/15/2019   Pertinent  Health Maintenance Due  Topic Date Due   INFLUENZA VACCINE  03/30/2022   DEXA SCAN  Discontinued      01/29/2019   10:30 PM 01/30/2019    2:00 PM 04/26/2022   10:00 AM 05/19/2022    9:12 AM 06/02/2022    2:12 PM  Fall Risk  Falls in the past year?    0   Was there an injury with Fall?    0   Fall Risk Category Calculator    0   Fall Risk Category    Low   Patient Fall Risk Level High fall risk High fall risk High fall risk Moderate  fall risk High fall risk  Patient at Risk for Falls Due to    History of fall(s)   Fall risk Follow up    Falls evaluation completed    Functional Status Survey:    Vitals:   08/10/22 1528  BP: 122/69  Pulse: 69  Resp: 18  Temp: 97.8 F (36.6 C)  SpO2: 94%  Weight: 172 lb 6.4 oz (78.2 kg)  Height: 5\' 4"  (1.626 m)   Body mass index is 29.59 kg/m. Physical Exam Constitutional:      General: She is not in acute distress.    Appearance: She is well-developed. She is not diaphoretic.  HENT:     Head: Normocephalic and atraumatic.     Mouth/Throat:     Pharynx: No oropharyngeal exudate.  Eyes:     Conjunctiva/sclera: Conjunctivae normal.     Pupils: Pupils are equal, round, and reactive to light.  Cardiovascular:     Rate and Rhythm: Normal rate and regular rhythm.     Heart sounds: Normal heart sounds.  Pulmonary:     Effort: Pulmonary effort is normal.     Breath sounds: Normal breath sounds.  Abdominal:     General: Bowel sounds are normal.     Palpations: Abdomen is soft.  Musculoskeletal:     Cervical back: Normal range of motion and neck supple.     Right lower leg: No edema.     Left lower leg: No edema.  Skin:    General: Skin is warm and dry.     Comments: Nail to right hand are thick and overgrown.  Yeast noted to hand   Neurological:     Mental Status: She is alert. Mental status is at baseline.     Motor: Weakness present.     Gait: Gait abnormal.  Psychiatric:        Mood and Affect: Mood normal.     Labs reviewed: Recent Labs    12/28/21 0000 04/26/22 1132  NA 141 139  K 3.7 3.5  CL 102 102  CO2 33* 30  GLUCOSE  --  141*  BUN 15 18  CREATININE 0.6 0.58  CALCIUM 9.4 9.0   Recent Labs  12/28/21 0000 04/26/22 1132  AST 15 21  ALT 12 14  ALKPHOS 62 66  BILITOT  --  1.0  PROT  --  6.6  ALBUMIN 3.6 3.7   Recent Labs    04/26/22 1132 07/13/22 1330 07/15/22 0000  WBC 6.1 5.2 5.6  NEUTROABS 3.9 3.2 3,046.00  HGB 13.1 13.4 12.3   HCT 40.1 40.7 37  MCV 94.8 94.9  --   PLT 104* 119* 108*   Lab Results  Component Value Date   TSH 1.15 06/30/2022   Lab Results  Component Value Date   HGBA1C 5.6 01/27/2018   Lab Results  Component Value Date   CHOL 146 06/30/2022   HDL 42 06/30/2022   LDLCALC 79 06/30/2022   TRIG 155 06/30/2022   CHOLHDL 3.7 01/28/2018    Significant Diagnostic Results in last 30 days:  No results found.  Assessment/Plan 1. Arthritis Continuesl tramadol PRN - traMADol (ULTRAM) 50 MG tablet; Take 1 tablet (50 mg total) by mouth every 8 (eight) hours as needed for moderate pain.  Dispense: 30 tablet; Refill: 0  2. Coccyx pain Continues tramadol PRN - traMADol (ULTRAM) 50 MG tablet; Take 1 tablet (50 mg total) by mouth every 8 (eight) hours as needed for moderate pain.  Dispense: 30 tablet; Refill: 0  3. Moderate vascular dementia with other behavioral disturbance (HCC) -Stable, no acute changes in cognitive or functional status, continue supportive care.  -continues with behaviors, will trail increase in Depakote to see if this improving crying out epsidoes, monitor for increase in lethargy.   4. Overgrown nail -nails trimmed today, pt tolerated well  5. Fungal infection of nail Nails trimmed today, pt tolerated well. Also yeast noted to hand, staff to use nystatin powder twice daily  6. Major depression in remission (HCC) -continues on celexa 10 mg daily, will monitor  7. Hypothyroidism due to acquired atrophy of thyroid -TSH at goal on last labs.continues synthroid 88 mcg  8. History of stroke -stable, continues on plavix,  ongoing care by staff at [facility] Biagio Borg [facility] [phone]

## 2022-08-17 ENCOUNTER — Encounter: Payer: Self-pay | Admitting: Nurse Practitioner

## 2022-08-31 DIAGNOSIS — E039 Hypothyroidism, unspecified: Secondary | ICD-10-CM | POA: Diagnosis not present

## 2022-08-31 LAB — TSH: TSH: 0.5 (ref 0.41–5.90)

## 2022-09-02 DIAGNOSIS — D649 Anemia, unspecified: Secondary | ICD-10-CM | POA: Diagnosis not present

## 2022-09-02 LAB — BASIC METABOLIC PANEL
BUN: 14 (ref 4–21)
CO2: 38 — AB (ref 13–22)
Chloride: 101 (ref 99–108)
Creatinine: 0.7 (ref 0.5–1.1)
Glucose: 113
Potassium: 4.1 mEq/L (ref 3.5–5.1)
Sodium: 142 (ref 137–147)

## 2022-09-02 LAB — HEPATIC FUNCTION PANEL
ALT: 11 U/L (ref 7–35)
AST: 18 (ref 13–35)
Alkaline Phosphatase: 52 (ref 25–125)
Bilirubin, Total: 0.6

## 2022-09-02 LAB — CBC AND DIFFERENTIAL
HCT: 39 (ref 36–46)
Hemoglobin: 13 (ref 12.0–16.0)
Platelets: 96 10*3/uL — AB (ref 150–400)
WBC: 6.2

## 2022-09-02 LAB — COMPREHENSIVE METABOLIC PANEL
Albumin: 3.6 (ref 3.5–5.0)
Calcium: 9.5 (ref 8.7–10.7)
Globulin: 2.5

## 2022-09-02 LAB — CBC: RBC: 4.11 (ref 3.87–5.11)

## 2022-09-20 ENCOUNTER — Other Ambulatory Visit: Payer: Self-pay | Admitting: Student

## 2022-09-20 DIAGNOSIS — G8929 Other chronic pain: Secondary | ICD-10-CM

## 2022-09-20 MED ORDER — TRAMADOL HCL 50 MG PO TABS: 50.0000 mg | ORAL_TABLET | Freq: Two times a day (BID) | ORAL | 0 refills | Status: DC

## 2022-09-20 NOTE — Progress Notes (Signed)
Refill Chronic pain management medication.

## 2022-10-12 ENCOUNTER — Non-Acute Institutional Stay (SKILLED_NURSING_FACILITY): Payer: Medicare PPO | Admitting: Nurse Practitioner

## 2022-10-12 ENCOUNTER — Encounter: Payer: Self-pay | Admitting: Nurse Practitioner

## 2022-10-12 DIAGNOSIS — Z8673 Personal history of transient ischemic attack (TIA), and cerebral infarction without residual deficits: Secondary | ICD-10-CM

## 2022-10-12 DIAGNOSIS — F01B18 Vascular dementia, moderate, with other behavioral disturbance: Secondary | ICD-10-CM | POA: Diagnosis not present

## 2022-10-12 DIAGNOSIS — E782 Mixed hyperlipidemia: Secondary | ICD-10-CM

## 2022-10-12 DIAGNOSIS — Z66 Do not resuscitate: Secondary | ICD-10-CM

## 2022-10-12 DIAGNOSIS — I10 Essential (primary) hypertension: Secondary | ICD-10-CM

## 2022-10-12 DIAGNOSIS — M533 Sacrococcygeal disorders, not elsewhere classified: Secondary | ICD-10-CM

## 2022-10-12 DIAGNOSIS — L602 Onychogryphosis: Secondary | ICD-10-CM

## 2022-10-12 DIAGNOSIS — F325 Major depressive disorder, single episode, in full remission: Secondary | ICD-10-CM

## 2022-10-12 DIAGNOSIS — E034 Atrophy of thyroid (acquired): Secondary | ICD-10-CM | POA: Diagnosis not present

## 2022-10-12 NOTE — Progress Notes (Unsigned)
Location:  Other [facility] Nursing Home Room Number: [place] of Service:  SNF [phone]) Provider:  Carlos American. Dewaine Oats, NP   Patient Care Team: Dewayne Shorter, MD as PCP - General (Family Medicine) Rockey Situ, Kathlene November, MD as Consulting Physician (Cardiology) Nyoka Cowden Phylis Bougie, NP as Nurse Practitioner (Geriatric Medicine)  Extended Emergency Contact Information Primary Emergency Contact: Dubois,[name] Address: [address]          [place], [place] [address] Johnnette Litter of [place] Phone: [phone] Relation: Daughter Secondary Emergency Contact: Eric Form Address: [address]          [place], [place] [address] Johnnette Litter of [place] Phone: [phone] Mobile Phone: [phone] Relation: Spouse  Code Status:  DNR Goals of care: Advanced Directive information    10/12/2022   11:01 AM  Advanced Directives  Does Patient Have a Medical Advance Directive? Yes  Type of Paramedic of [place];Living will;Out of facility DNR (pink MOST or yellow form)  Does patient want to make changes to medical advance directive? No - Patient declined  Copy of [place] in Chart? Yes - validated most recent copy scanned in chart (See row information)  Pre-existing out of facility DNR order (yellow form or pink MOST form) Yellow form placed in chart (order not valid for inpatient use);Pink MOST form placed in chart (order not valid for inpatient use)     Chief Complaint  Patient presents with   Medical Management of Chronic Issues    Routine visit. Discuss need for td/tdap, shingrix, AWV, pneumonia vaccines, flu vaccine and covid boosters or post pone if patient refuses or is not a candidate.    Acute Visit    Toe nail trim     HPI:  Pt is a 87 y.o. female seen today for medical management of chronic diseases.  Pt with hx of dementia, hx of CVA, depression, hypothryoid who is in [facility] for  long term care.  Staff reports toenails are over grown and nursing request trim.  Pt has tolerated depakote well, she still yells out but has done better with less episodes.  Overall pain has been controlled.    Past Medical History:  Diagnosis Date   Cerebral infarction [facility])    CKD (chronic kidney disease), stage III (HCC)    Depression    DJD (degenerative joint disease)    Edema    High cholesterol    Hyperlipidemia, unspecified    Hypertension    Hypothyroidism    Lower GI bleed 01/31/2019   Last Assessment & Plan:  Formatting of this note might be different from the original. Noted several episodes here and at [facility]. Was on dual antiplatelets and still is but asa dose was lowered. She is doing well now and in good spirits today in her room in isolation given covid. No abd pain and eating well with normal bm's per staff.   Malaise    Morbid obesity ([place])    Osteoarthritis    Parathyroid abnormality (HCC)    Seizures ([place])    a. following remote stroke.   Stroke [facility]) 2002, 05/2017, 01/26/18, 01/30/18   a. 1964-->residual right arm wkns.  Previously on coumadin - pt says for just a few yrs.   Subdural hematoma ([place])    a. 10/2001 [facility] req temporal frontoparietal craniotomy following fall. ? whether or not pt on coumadin @ time.  Notes indicate yes but pt denies.   Thyroid disease  TMJ (dislocation of temporomandibular joint)    Tuberculosis    a. ~ 1950   Urinary incontinence    Weight loss    Past Surgical History:  Procedure Laterality Date   ABDOMINAL HYSTERECTOMY     BACK SURGERY     BRAIN SURGERY     cataract surgery     2014   LEFT HEART CATH AND CORONARY ANGIOGRAPHY N/A 06/13/2017   Procedure: LEFT HEART CATH AND CORONARY ANGIOGRAPHY;  Surgeon: Wellington Hampshire, MD;  Location: [place] CV LAB;  Service: Cardiovascular;  Laterality: N/A;   PARATHYROID ADENOMA REMOVAL     TEE WITHOUT CARDIOVERSION N/A 01/31/2018   Procedure: TRANSESOPHAGEAL ECHOCARDIOGRAM (TEE);   Surgeon: Nelva Bush, MD;  Location: [facility] ORS;  Service: Cardiovascular;  Laterality: N/A;   TEE WITHOUT CARDIOVERSION N/A 02/01/2018   Procedure: TRANSESOPHAGEAL ECHOCARDIOGRAM (TEE);  Surgeon: Minna Merritts, MD;  Location: [facility] ORS;  Service: Cardiovascular;  Laterality: N/A;    No Known Allergies  Outpatient Encounter Medications as of 10/12/2022  Medication Sig   acetaminophen (TYLENOL) 325 MG tablet Take 650 mg by mouth every 4 (four) hours as needed. See other listing   acetaminophen (TYLENOL) 325 MG tablet Take 650 mg by mouth 3 (three) times daily. Scheduled, see as needed listing   alum & mag hydroxide-simeth (MAALOX/MYLANTA) 200-200-20 MG/5ML suspension Take 30 mLs by mouth every 4 (four) hours as needed for indigestion or heartburn.   atorvastatin (LIPITOR) 80 MG tablet Take 1 tablet (80 mg total) by mouth daily at 6 PM.   bisacodyl (DULCOLAX) 10 MG suppository Place 10 mg rectally as needed for moderate constipation.   citalopram (CELEXA) 10 MG tablet Take 10 mg by mouth daily.   clopidogrel (PLAVIX) 75 MG tablet Take 1 tablet (75 mg total) by mouth daily.   divalproex (DEPAKOTE SPRINKLE) 125 MG capsule Take 250 mg by mouth 2 (two) times daily.   guaiFENesin (TUSSIN PO) Take 10 mLs by mouth every 4 (four) hours as needed (cough).   hydrochlorothiazide (MICROZIDE) 12.5 MG capsule Take 12.5 mg by mouth daily.   hydrocortisone 2.5 % cream Apply 1 Application topically every 8 (eight) hours as needed (for hemorrhoids).   Infant Care Products [facility]) OINT Apply 1 application  topically every 12 (twelve) hours as needed (apply to axilla, groin, and under breast).   latanoprost (XALATAN) 0.005 % ophthalmic solution Place 1 drop into both eyes at bedtime.    levothyroxine (SYNTHROID) 88 MCG tablet Take 88 mcg by mouth daily. At bedtime   lisinopril (ZESTRIL) 20 MG tablet Take 20 mg by mouth daily.   magnesium hydroxide (MILK OF MAGNESIA) 400 MG/5ML suspension Take 30 mLs by  mouth every 12 (twelve) hours as needed for mild constipation.   Menthol, Topical Analgesic, (BIOFREEZE) 4 % GEL Apply 1 Application topically every 8 (eight) hours as needed (For knee pain, apply to both knees).   NYAMYC powder Apply 1 Application topically every 12 (twelve) hours as needed (for skin breakdown, apply under breast).   polyethylene glycol (MIRALAX / GLYCOLAX) packet Take 17 g by mouth daily as needed.   tamsulosin (FLOMAX) 0.4 MG CAPS capsule Take 1 capsule (0.4 mg total) by mouth daily.   traMADol (ULTRAM) 50 MG tablet Take 1 tablet (50 mg total) by mouth in the morning and at bedtime. Give 1 tablet by mouth every 6 hours as needed for increased pain   No facility-administered encounter medications on file as of 10/12/2022.    Review of Systems  Unable to perform ROS: Dementia    Immunization History  Administered Date(s) Administered   Influenza Split 07/18/2014, 07/23/2015   Influenza, High Dose Seasonal PF 06/14/2017, 06/15/2022   Influenza-Unspecified 05/25/2012, 05/31/2016, 06/01/2018, 06/21/2021   Moderna Covid-19 Vaccine Bivalent Booster 50yr & up 01/26/2022   Moderna SARS-COV2 Booster Vaccination 07/11/2020, 01/16/2021   Moderna Sars-Covid-2 Vaccination 09/19/2019, 10/17/2019   PFIZER Comirnaty(Gray Top)Covid-19 Tri-Sucrose Vaccine 07/09/2022   Pfizer Covid-19 Vaccine Bivalent Booster 139yr& up 05/22/2021   Pneumococcal Polysaccharide-23 08/30/2004, 08/04/2017, 02/15/2019   Pertinent  Health Maintenance Due  Topic Date Due   INFLUENZA VACCINE  Completed   DEXA SCAN  Discontinued      01/29/2019   10:30 PM 01/30/2019    2:00 PM 04/26/2022   10:00 AM 05/19/2022    9:12 AM 06/02/2022    2:12 PM  Fall Risk  Falls in the past year?    0   Was there an injury with Fall?    0   Fall Risk Category Calculator    0   Fall Risk Category (Retired)    Low   (RETIRED) Patient Fall Risk Level High fall risk High fall risk High fall risk Moderate fall risk High fall risk   Patient at Risk for Falls Due to    History of fall(s)   Fall risk Follow up    Falls evaluation completed    Functional Status Survey:    Vitals:   10/12/22 1023  BP: 130/71  Pulse: 68  Weight: 156 lb 9.6 oz (71 kg)  Height: 5' 4"$  (1.626 m)   Body mass index is 26.88 kg/m. Physical Exam Constitutional:      General: She is not in acute distress.    Appearance: She is well-developed. She is not diaphoretic.  HENT:     Head: Normocephalic and atraumatic.     Mouth/Throat:     Pharynx: No oropharyngeal exudate.  Eyes:     Conjunctiva/sclera: Conjunctivae normal.     Pupils: Pupils are equal, round, and reactive to light.  Cardiovascular:     Rate and Rhythm: Normal rate and regular rhythm.     Heart sounds: Normal heart sounds.  Pulmonary:     Effort: Pulmonary effort is normal.     Breath sounds: Normal breath sounds.  Abdominal:     General: Bowel sounds are normal.     Palpations: Abdomen is soft.  Musculoskeletal:        General: No tenderness.     Cervical back: Normal range of motion and neck supple.  Feet:     Right foot:     Toenail Condition: Right toenails are abnormally thick and long.     Left foot:     Toenail Condition: Left toenails are abnormally thick and long.  Skin:    General: Skin is warm and dry.  Neurological:     Mental Status: She is alert. Mental status is at baseline.     Labs reviewed: Recent Labs    12/28/21 0000 04/26/22 1132 09/02/22 0000  NA 141 139 142  K 3.7 3.5 4.1  CL 102 102 101  CO2 33* 30 38*  GLUCOSE  --  141*  --   BUN 15 18 14  $ CREATININE 0.6 0.58 0.7  CALCIUM 9.4 9.0 9.5   Recent Labs    12/28/21 0000 04/26/22 1132 09/02/22 0000  AST 15 21 18  $ ALT 12 14 11  $ ALKPHOS 62 66 52  BILITOT  --  1.0  --  PROT  --  6.6  --   ALBUMIN 3.6 3.7 3.6   Recent Labs    04/26/22 1132 07/13/22 1330 07/15/22 0000 09/02/22 0000  WBC 6.1 5.2 5.6 6.2  NEUTROABS 3.9 3.2 3,046.00  --   HGB 13.1 13.4 12.3 13.0   HCT 40.1 40.7 37 39  MCV 94.8 94.9  --   --   PLT 104* 119* 108* 96*   Lab Results  Component Value Date   TSH 0.50 08/31/2022   Lab Results  Component Value Date   HGBA1C 5.6 01/27/2018   Lab Results  Component Value Date   CHOL 146 06/30/2022   HDL 42 06/30/2022   LDLCALC 79 06/30/2022   TRIG 155 06/30/2022   CHOLHDL 3.7 01/28/2018    Significant Diagnostic Results in last 30 days:  No results found.  Assessment/Plan 1. Overgrown nail -Consistent provided for toe nails to be trimmed, Nails sharply debrided 10 without complication/bleeding  2. Do not resuscitate On file at facility - Do not attempt resuscitation (DNR)  3. Coccyx pain -ongoing continues tylenol and tramadol   4. Moderate vascular dementia with other behavioral disturbance (HCC) -Stable, no acute changes in cognitive or functional status, continue supportive care.   5. Major depression in remission ([place]) -stable on celexa 10 mg daily  6. Hypothyroidism due to acquired atrophy of thyroid -TSH at goal on synthroid 88 mcg  7. History of stroke -stable, continues on plavix, statin and proper bp control.   8. Primary hypertension Blood pressure well controlled, goal bp <140/90 Continue current medications and dietary modifications follow metabolic panel  9. Mixed hyperlipidemia Continues on lipitor.  Carlos American. [place], [facility] [phone]

## 2022-10-18 ENCOUNTER — Other Ambulatory Visit: Payer: Self-pay | Admitting: Student

## 2022-10-18 DIAGNOSIS — G8929 Other chronic pain: Secondary | ICD-10-CM

## 2022-10-18 NOTE — Telephone Encounter (Signed)
Refill chronic pain medication.

## 2022-10-19 ENCOUNTER — Other Ambulatory Visit: Payer: Self-pay | Admitting: Nurse Practitioner

## 2022-10-19 DIAGNOSIS — G8929 Other chronic pain: Secondary | ICD-10-CM

## 2022-10-19 MED ORDER — TRAMADOL HCL 50 MG PO TABS: ORAL_TABLET | ORAL | 0 refills | Status: DC

## 2022-11-15 DIAGNOSIS — Z20818 Contact with and (suspected) exposure to other bacterial communicable diseases: Secondary | ICD-10-CM | POA: Diagnosis not present

## 2022-11-15 DIAGNOSIS — B950 Streptococcus, group A, as the cause of diseases classified elsewhere: Secondary | ICD-10-CM | POA: Diagnosis not present

## 2022-11-29 DIAGNOSIS — D649 Anemia, unspecified: Secondary | ICD-10-CM | POA: Diagnosis not present

## 2022-11-29 DIAGNOSIS — I10 Essential (primary) hypertension: Secondary | ICD-10-CM | POA: Diagnosis not present

## 2022-11-29 LAB — BASIC METABOLIC PANEL
BUN: 22 — AB (ref 4–21)
CO2: 34 — AB (ref 13–22)
Chloride: 108 (ref 99–108)
Creatinine: 0.6 (ref 0.5–1.1)
Glucose: 71
Potassium: 3.7 mEq/L (ref 3.5–5.1)
Sodium: 145 (ref 137–147)

## 2022-11-29 LAB — CBC AND DIFFERENTIAL
HCT: 34 — AB (ref 36–46)
Hemoglobin: 11.1 — AB (ref 12.0–16.0)
Neutrophils Absolute: 3386
Platelets: 98 10*3/uL — AB (ref 150–400)
WBC: 6.8

## 2022-11-29 LAB — HEPATIC FUNCTION PANEL
ALT: 6 U/L — AB (ref 7–35)
AST: 14 (ref 13–35)
Alkaline Phosphatase: 43 (ref 25–125)
Bilirubin, Total: 0.5

## 2022-11-29 LAB — CBC: RBC: 3.53 — AB (ref 3.87–5.11)

## 2022-11-29 LAB — COMPREHENSIVE METABOLIC PANEL
Albumin: 3.1 — AB (ref 3.5–5.0)
Calcium: 8.8 (ref 8.7–10.7)
Globulin: 2
eGFR: 88

## 2022-12-02 ENCOUNTER — Encounter: Payer: Self-pay | Admitting: Oncology

## 2022-12-02 ENCOUNTER — Inpatient Hospital Stay: Payer: Medicare PPO | Attending: Oncology | Admitting: Oncology

## 2022-12-02 DIAGNOSIS — D696 Thrombocytopenia, unspecified: Secondary | ICD-10-CM | POA: Insufficient documentation

## 2022-12-03 NOTE — Telephone Encounter (Signed)
Roderic Palau, will you contact [name] to r/s virtual visit please.

## 2022-12-10 ENCOUNTER — Encounter: Payer: Self-pay | Admitting: Oncology

## 2022-12-10 ENCOUNTER — Telehealth: Payer: Self-pay

## 2022-12-10 ENCOUNTER — Inpatient Hospital Stay (HOSPITAL_BASED_OUTPATIENT_CLINIC_OR_DEPARTMENT_OTHER): Payer: Medicare PPO | Admitting: Oncology

## 2022-12-10 DIAGNOSIS — D696 Thrombocytopenia, unspecified: Secondary | ICD-10-CM | POA: Diagnosis not present

## 2022-12-10 NOTE — Progress Notes (Signed)
HEMATOLOGY-ONCOLOGY TeleHEALTH VISIT PROGRESS NOTE  I connected with Shawna [name] on 12/10/22  at 12:30 PM EDT by video enabled telemedicine visit and verified that I am speaking with the correct person using two identifiers. I discussed the limitations, risks, security and privacy concerns of performing an evaluation and management service by telemedicine and the availability of in-person appointments. The patient expressed understanding and agreed to proceed.   Other persons participating in the visit and their role in the encounter:  [name]  Patient's location: Nursing home facility. Provider's location: office Chief Complaint: Follow-up for thrombocytopenia.  ASSESSMENT & PLAN:   Thrombocytopenia (HCC) Previous work up showed  Adequate vitamin B12, folate, negative peripheral blood flow cytometry.  Negative M protein on SPEP, slightly nonspecific increased light chain ratio.  24-hour urine protein electrophoresis negative M protein. Increased immature platelet count. Labs are reviewed and discussed with patient and daughter CBC showed a [place] of [address], slightly decreased.  Discussed with patient and daughter that thrombocytopenia is stable and is likely secondary to reactive process, drug side effects/ITP, can not exclude bone marrow disorders.  I recommend observation and repeat blood work in 6 months.  All questions were answered. The patient knows to call the clinic with any problems, questions or concerns. Patient's daughter request virtual visit follow-ups due to patient's mobility issue..  She understands limitation of virtual visits.  Follow-up plan, 6 months, lab and virtual follow-up. [name], MD, PhD [facility] Hematology Oncology 12/10/2022   INTERVAL HISTORY Shawna [name] is a 87 y.o. female who has above history reviewed by me today presents virtually for follow up visit for management of follow-up for thrombocytopenia Patient had a blood  work done at nursing home.   Review of Systems  Constitutional:  Positive for fatigue. Negative for appetite change, chills and fever.  Cardiovascular:  Negative for chest pain.  Gastrointestinal:  Negative for abdominal pain.  Endocrine: Negative for hot flashes.  Musculoskeletal:  Negative for arthralgias.       Right hemiaplasia   Skin:  Negative for itching and rash.  Neurological:  Positive for extremity weakness.  Hematological:  Negative for adenopathy.    Past Medical History:  Diagnosis Date   Cerebral infarction    CKD (chronic kidney disease), stage III    Depression    DJD (degenerative joint disease)    Edema    High cholesterol    Hyperlipidemia, unspecified    Hypertension    Hypothyroidism    Lower GI bleed 01/31/2019   Last Assessment & Plan:  Formatting of this note might be different from the original. Noted several episodes here and at [facility]. Was on dual antiplatelets and still is but asa dose was lowered. She is doing well now and in good spirits today in her room in isolation given covid. No abd pain and eating well with normal bm's per staff.   Malaise    Morbid obesity    Osteoarthritis    Parathyroid abnormality    Seizures    a. following remote stroke.   Stroke 2002, 05/2017, 01/26/18, 01/30/18   a. 1964-->residual right arm wkns.  Previously on coumadin - pt says for just a few yrs.   Subdural hematoma    a. 10/2001 [facility] req temporal frontoparietal craniotomy following fall. ? whether or not pt on coumadin @ time.  Notes indicate yes but pt denies.   Thyroid disease    TMJ (dislocation of temporomandibular joint)    Tuberculosis  a. ~ 1950   Urinary incontinence    Weight loss    Past Surgical History:  Procedure Laterality Date   ABDOMINAL HYSTERECTOMY     BACK SURGERY     BRAIN SURGERY     cataract surgery     2014   LEFT HEART CATH AND CORONARY ANGIOGRAPHY N/A 06/13/2017   Procedure: LEFT HEART CATH AND CORONARY ANGIOGRAPHY;  Surgeon: Iran Ouch, MD;  Location: [facility] INVASIVE CV LAB;  Service: Cardiovascular;  Laterality: N/A;   PARATHYROID ADENOMA REMOVAL     TEE WITHOUT CARDIOVERSION N/A 01/31/2018   Procedure: TRANSESOPHAGEAL ECHOCARDIOGRAM (TEE);  Surgeon: Yvonne Kendall, MD;  Location: [facility] ORS;  Service: Cardiovascular;  Laterality: N/A;   TEE WITHOUT CARDIOVERSION N/A 02/01/2018   Procedure: TRANSESOPHAGEAL ECHOCARDIOGRAM (TEE);  Surgeon: Antonieta Iba, MD;  Location: [facility] ORS;  Service: Cardiovascular;  Laterality: N/A;    Family History  Problem Relation Age of Onset   Heart failure Mother        died @ 70   Heart attack Father        died @ 67   Stroke Father    Hypertension Sister    Diabetes Sister    Hypertension Brother    Diabetes Brother    Breast cancer Neg Hx     Social History   Socioeconomic History   Marital status: Married    Spouse name: Fayrene Fearing   Number of children: 2   Years of education: BS   Highest education level: Not on file  Occupational History   Not on file  Tobacco Use   Smoking status: Never   Smokeless tobacco: Never  Vaping Use   Vaping Use: Never used  Substance and Sexual Activity   Alcohol use: No   Drug use: No   Sexual activity: Not on file  Other Topics Concern   Not on file  Social History Narrative   [place]  Active around the house.  Does not routinely exercise or drive.  Does own grocery shopping.  10/18/2018 residing at [facility]	[place]   Social Determinants of Health   Financial Resource Strain: Low Risk  (01/29/2019)   Overall Financial Resource Strain (CARDIA)    Difficulty of Paying Living Expenses: Not hard at all  Food Insecurity: Unknown (01/29/2019)   Hunger Vital Sign    Worried About Running Out of Food in the Last Year: Patient declined    Ran Out of Food in the Last Year: Patient declined  Transportation Needs: Unknown (01/29/2019)   PRAPARE - Administrator, Civil Service (Medical):  Patient declined    Lack of Transportation (Non-Medical): Patient declined  Physical Activity: Unknown (01/29/2019)   Exercise Vital Sign    Days of Exercise per Week: Patient declined    Minutes of Exercise per Session: Patient declined  Stress: Not on file  Social Connections: Unknown (01/29/2019)   Social Connection and Isolation Panel [NHANES]    Frequency of Communication with Friends and Family: Patient declined    Frequency of Social Gatherings with Friends and Family: Patient declined    Attends Religious Services: Patient declined    Database administrator or Organizations: Patient declined    Attends Banker Meetings: Patient declined    Marital Status: Patient declined  Intimate Partner Violence: Not At Risk (01/29/2019)   Humiliation, Afraid, Rape, and Kick questionnaire    Fear of Current or Ex-Partner: No  Emotionally Abused: No    Physically Abused: No    Sexually Abused: No    Current Outpatient Medications on File Prior to Visit  Medication Sig Dispense Refill   acetaminophen (TYLENOL) 325 MG tablet Take 650 mg by mouth every 4 (four) hours as needed. See other listing     atorvastatin (LIPITOR) 80 MG tablet Take 1 tablet (80 mg total) by mouth daily at 6 PM. 30 tablet 2   citalopram (CELEXA) 10 MG tablet Take 10 mg by mouth daily.     clopidogrel (PLAVIX) 75 MG tablet Take 1 tablet (75 mg total) by mouth daily. 30 tablet 2   divalproex (DEPAKOTE SPRINKLE) 125 MG capsule Take 250 mg by mouth 2 (two) times daily.     hydrochlorothiazide (MICROZIDE) 12.5 MG capsule Take 12.5 mg by mouth daily.     hydrocortisone 2.5 % cream Apply 1 Application topically every 8 (eight) hours as needed (for hemorrhoids).     Infant Care Products [facility]) OINT Apply 1 application  topically every 12 (twelve) hours as needed (apply to axilla, groin, and under breast).     latanoprost (XALATAN) 0.005 % ophthalmic solution Place 1 drop into both eyes at bedtime.   3    levothyroxine (SYNTHROID) 88 MCG tablet Take 88 mcg by mouth daily. At bedtime     lisinopril (ZESTRIL) 20 MG tablet Take 20 mg by mouth daily.     magnesium hydroxide (MILK OF MAGNESIA) 400 MG/5ML suspension Take 30 mLs by mouth every 12 (twelve) hours as needed for mild constipation.     Menthol, Topical Analgesic, (BIOFREEZE) 4 % GEL Apply 1 Application topically every 8 (eight) hours as needed (For knee pain, apply to both knees).     NYAMYC powder Apply 1 Application topically every 12 (twelve) hours as needed (for skin breakdown, apply under breast).     polyethylene glycol (MIRALAX / GLYCOLAX) packet Take 17 g by mouth daily as needed.     tamsulosin (FLOMAX) 0.4 MG CAPS capsule Take 1 capsule (0.4 mg total) by mouth daily. 30 capsule 1   traMADol (ULTRAM) 50 MG tablet TAKE 1 TABLET BY MOUTH TWICE DAILY;TAKE 1 TABLET BY MOUTH EVERY SIX HOURS AS NEEDED FOR INCREASED PAIN 240 tablet 0   acetaminophen (TYLENOL) 325 MG tablet Take 650 mg by mouth 3 (three) times daily. Scheduled, see as needed listing (Patient not taking: Reported on 12/10/2022)     alum & mag hydroxide-simeth (MAALOX/MYLANTA) 200-200-20 MG/5ML suspension Take 30 mLs by mouth every 4 (four) hours as needed for indigestion or heartburn. (Patient not taking: Reported on 12/10/2022)     bisacodyl (DULCOLAX) 10 MG suppository Place 10 mg rectally as needed for moderate constipation. (Patient not taking: Reported on 12/10/2022)     guaiFENesin (TUSSIN PO) Take 10 mLs by mouth every 4 (four) hours as needed (cough). (Patient not taking: Reported on 12/10/2022)     No current facility-administered medications on file prior to visit.    No Known Allergies     Observations/Objective: There were no vitals filed for this visit.  There is no height or weight on file to calculate BMI.  Physical Exam Constitutional:      Comments: Patient lies in her bed.  No acute distress.      Labs    Latest Ref Rng & Units 09/02/2022   12:00 AM  07/15/2022   12:00 AM 07/13/2022    1:30 PM  CBC  WBC  6.2     5.6  5.2   Hemoglobin 12.0 - 16.0 13.0     12.3     13.4   Hematocrit 36 - 46 39     37     40.7   Platelets 150 - 400 K/uL 96     108     119      This result is from an external source.       Latest Ref Rng & Units 09/02/2022   12:00 AM 04/26/2022   11:32 AM 12/28/2021   12:00 AM  CMP  Glucose 70 - 99 mg/dL  621    BUN 4 - Creatinine 0.5 - 1.1 0.7     0.58  0.6      Sodium 137 - 147 142     139  141      Potassium 3.5 - 5.1 mEq/L 4.1     3.5  3.7      Chloride 99 - 108 101     102  102      CO2 13 - 22 38     30  33      Calcium 8.7 - 10.7 9.5     9.0  9.4      Total Protein 6.5 - 8.1 g/dL  6.6    Total Bilirubin 0.3 - 1.2 mg/dL  1.0    Alkaline Phos 25 - 125 52     66  62      AST 13 - 35 ALT 7 - 35 U/L This result is from an external source.

## 2022-12-10 NOTE — Progress Notes (Signed)
Pt contacted for Mychart visit. No new concerns voiced.  

## 2022-12-10 NOTE — Telephone Encounter (Signed)
Lab order requisition (CBC) faxed to [facility] with expected draw date: week of 06/06/23

## 2022-12-10 NOTE — Assessment & Plan Note (Signed)
Previous work up showed  Adequate vitamin B12, folate, negative peripheral blood flow cytometry.  Negative M protein on SPEP, slightly nonspecific increased light chain ratio.  24-hour urine protein electrophoresis negative M protein. Increased immature platelet count. Labs are reviewed and discussed with patient and daughter CBC showed [place] [address], slightly decreased.  Discussed with patient and daughter that thrombocytopenia is stable and is likely secondary to reactive process, drug side effects/ITP, can not exclude bone marrow disorders.  I recommend observation and repeat blood work in 6 months.

## 2022-12-17 ENCOUNTER — Encounter: Payer: Self-pay | Admitting: Student

## 2022-12-17 ENCOUNTER — Non-Acute Institutional Stay (SKILLED_NURSING_FACILITY): Payer: Medicare PPO | Admitting: Student

## 2022-12-17 DIAGNOSIS — F418 Other specified anxiety disorders: Secondary | ICD-10-CM | POA: Diagnosis not present

## 2022-12-17 DIAGNOSIS — I10 Essential (primary) hypertension: Secondary | ICD-10-CM | POA: Diagnosis not present

## 2022-12-17 DIAGNOSIS — D696 Thrombocytopenia, unspecified: Secondary | ICD-10-CM

## 2022-12-17 DIAGNOSIS — E034 Atrophy of thyroid (acquired): Secondary | ICD-10-CM

## 2022-12-17 DIAGNOSIS — I5032 Chronic diastolic (congestive) heart failure: Secondary | ICD-10-CM | POA: Diagnosis not present

## 2022-12-17 DIAGNOSIS — Z8673 Personal history of transient ischemic attack (TIA), and cerebral infarction without residual deficits: Secondary | ICD-10-CM | POA: Diagnosis not present

## 2022-12-17 DIAGNOSIS — N183 Chronic kidney disease, stage 3 unspecified: Secondary | ICD-10-CM

## 2022-12-17 DIAGNOSIS — F0150 Vascular dementia without behavioral disturbance: Secondary | ICD-10-CM

## 2022-12-17 DIAGNOSIS — G8929 Other chronic pain: Secondary | ICD-10-CM

## 2022-12-17 NOTE — Progress Notes (Signed)
Location:  [facility].  Nursing Home Room Number: [facility] [address] Place of Service:  SNF [phone]) Provider:  Earnestine Mealing, MD  Patient Care Team: Earnestine Mealing, MD as PCP - General (Family Medicine) Mariah Milling, Tollie Pizza, MD as Consulting Physician (Cardiology) Sharee Holster, NP as Nurse Practitioner (Geriatric Medicine)  Extended Emergency Contact Information Primary Emergency Contact: Montroy,[name] Address: [address]          [place], [place] [address] Darden Amber of [place] Phone: [phone] Relation: Daughter Secondary Emergency Contact: Freda Munro Address: [address]          [place], [place] [address] Darden Amber of [place] Home Phone: [phone] Mobile Phone: [phone] Relation: Spouse  Code Status:  DNR Goals of care: Advanced Directive information    12/17/2022    9:09 AM  Advanced Directives  Does Patient Have a Medical Advance Directive? Yes  Type of Estate agent of [place];Out of facility DNR (pink MOST or yellow form);Living will  Does patient want to make changes to medical advance directive? No - Patient declined  Copy of Healthcare Power of Attorney in Chart? Yes - validated most recent copy scanned in chart (See row information)     Chief Complaint  Patient presents with  . Medical Management of Chronic Issues    Medical Management of Chronic Issues.     HPI:  Pt is a 87 y.o. female seen today for medical management of chronic diseases.    Patient is laying in bed. She states she has the same chronic bottom pain. She has no other concerns. She ate her breakfast in the bed.   Nursing states she continues to complain of buttock pain often.    Past Medical History:  Diagnosis Date  . Cerebral infarction   . CKD (chronic kidney disease), stage III   . Depression   . DJD (degenerative joint disease)   . Edema   . High cholesterol   . Hyperlipidemia, unspecified   .  Hypertension   . Hypothyroidism   . Lower GI bleed 01/31/2019   Last Assessment & Plan:  Formatting of this note might be different from the original. Noted several episodes here and at [facility]. Was on dual antiplatelets and still is but asa dose was lowered. She is doing well now and in good spirits today in her room in isolation given covid. No abd pain and eating well with normal bm's per staff.  Toma Deiters   . Morbid obesity   . Osteoarthritis   . Parathyroid abnormality   . Seizures    a. following remote stroke.  . Stroke 2002, 05/2017, 01/26/18, 01/30/18   a. 1964-->residual right arm wkns.  Previously on coumadin - pt says for just a few yrs.  . Subdural hematoma    a. 10/2001 [facility] req temporal frontoparietal craniotomy following fall. ? whether or not pt on coumadin @ time.  Notes indicate yes but pt denies.  . Thyroid disease   . TMJ (dislocation of temporomandibular joint)   . Tuberculosis    a. ~ 1950  . Urinary incontinence   . Weight loss    Past Surgical History:  Procedure Laterality Date  . ABDOMINAL HYSTERECTOMY    . BACK SURGERY    . BRAIN SURGERY    . cataract surgery     2014  . LEFT HEART CATH AND CORONARY ANGIOGRAPHY N/A 06/13/2017   Procedure: LEFT HEART CATH AND CORONARY ANGIOGRAPHY;  Surgeon: Iran Ouch, MD;  Location: [facility] INVASIVE CV LAB;  Service: Cardiovascular;  Laterality: N/A;  . PARATHYROID ADENOMA REMOVAL    . TEE WITHOUT CARDIOVERSION N/A 01/31/2018   Procedure: TRANSESOPHAGEAL ECHOCARDIOGRAM (TEE);  Surgeon: Yvonne Kendall, MD;  Location: [facility] ORS;  Service: Cardiovascular;  Laterality: N/A;  . TEE WITHOUT CARDIOVERSION N/A 02/01/2018   Procedure: TRANSESOPHAGEAL ECHOCARDIOGRAM (TEE);  Surgeon: Antonieta Iba, MD;  Location: [facility] ORS;  Service: Cardiovascular;  Laterality: N/A;    No Known Allergies  Outpatient Encounter Medications as of 12/17/2022  Medication Sig  . acetaminophen (TYLENOL) 325 MG tablet Take 650 mg by mouth every 4 (four)  hours as needed. See other listing  . acetaminophen (TYLENOL) 325 MG tablet Take 650 mg by mouth 3 (three) times daily. Scheduled, see as needed listing  . alum & mag hydroxide-simeth (MAALOX/MYLANTA) 200-200-20 MG/5ML suspension Take 30 mLs by mouth every 4 (four) hours as needed for indigestion or heartburn.  Marland Kitchen atorvastatin (LIPITOR) 80 MG tablet Take 1 tablet (80 mg total) by mouth daily at 6 PM.  . bisacodyl (DULCOLAX) 10 MG suppository Place 10 mg rectally as needed for moderate constipation.  . citalopram (CELEXA) 10 MG tablet Take 10 mg by mouth daily.  . clopidogrel (PLAVIX) 75 MG tablet Take 1 tablet (75 mg total) by mouth daily.  . divalproex (DEPAKOTE SPRINKLE) 125 MG capsule Take 250 mg by mouth 2 (two) times daily.  Marland Kitchen guaiFENesin (TUSSIN PO) Take 10 mLs by mouth every 4 (four) hours as needed (cough).  . hydrochlorothiazide (MICROZIDE) 12.5 MG capsule Take 12.5 mg by mouth daily.  . hydrocortisone 2.5 % cream Apply 1 Application topically every 8 (eight) hours as needed (for hemorrhoids).  . Infant Care Products (DERMACLOUD) OINT Apply 1 application  topically every 12 (twelve) hours as needed (apply to axilla, groin, and under breast).  Marland Kitchen latanoprost (XALATAN) 0.005 % ophthalmic solution Place 1 drop into both eyes at bedtime.   Marland Kitchen levothyroxine (SYNTHROID) 88 MCG tablet Take 88 mcg by mouth daily. At bedtime  . lisinopril (ZESTRIL) 20 MG tablet Take 20 mg by mouth daily.  . magnesium hydroxide (MILK OF MAGNESIA) 400 MG/5ML suspension Take 30 mLs by mouth every 12 (twelve) hours as needed for mild constipation.  . Menthol, Topical Analgesic, (BIOFREEZE) 4 % GEL Apply 1 Application topically every 8 (eight) hours as needed (For knee pain, apply to both knees).  Shelle Iron powder Apply 1 Application topically every 12 (twelve) hours as needed (for skin breakdown, apply under breast).  . polyethylene glycol (MIRALAX / GLYCOLAX) packet Take 17 g by mouth daily as needed.  . tamsulosin  (FLOMAX) 0.4 MG CAPS capsule Take 1 capsule (0.4 mg total) by mouth daily.  . traMADol (ULTRAM) 50 MG tablet TAKE 1 TABLET BY MOUTH TWICE DAILY;TAKE 1 TABLET BY MOUTH EVERY SIX HOURS AS NEEDED FOR INCREASED PAIN   No facility-administered encounter medications on file as of 12/17/2022.    Review of Systems  Immunization History  Administered Date(s) Administered  . Influenza Split 07/18/2014, 07/23/2015  . Influenza, High Dose Seasonal PF 06/14/2017, 06/15/2022  . Influenza-Unspecified 05/25/2012, 05/31/2016, 06/01/2018, 06/21/2021  . Moderna Covid-19 Vaccine Bivalent Booster 57yrs & up 01/26/2022  . Moderna SARS-COV2 Booster Vaccination 07/11/2020, 01/16/2021  . Moderna Sars-Covid-2 Vaccination 09/19/2019, 10/17/2019  . PFIZER Comirnaty(Gray Top)Covid-19 Tri-Sucrose Vaccine 07/09/2022  . Research officer, [employer] 52yrs & up 05/22/2021  . Pneumococcal Polysaccharide-23 08/30/2004, 08/04/2017, 02/15/2019   Pertinent  Health Maintenance Due  Topic Date Due  . INFLUENZA  VACCINE  03/31/2023  . DEXA SCAN  Discontinued      01/29/2019   10:30 PM 01/30/2019    2:00 PM 04/26/2022   10:00 AM 05/19/2022    9:12 AM 06/02/2022    2:12 PM  Fall Risk  Falls in the past year?    0   Was there an injury with Fall?    0   Fall Risk Category Calculator    0   Fall Risk Category (Retired)    Low   (RETIRED) Patient Fall Risk Level High fall risk High fall risk High fall risk Moderate fall risk High fall risk  Patient at Risk for Falls Due to    History of fall(s)   Fall risk Follow up    Falls evaluation completed    Functional Status Survey:    Vitals:   12/17/22 0903  BP: 133/80  Pulse: 87  Resp: 16  Temp: (!) 96.7 F (35.9 C)  SpO2: 95%  Weight: 154 lb 6.4 oz (70 kg)  Height: 5\' 4"  (1.626 m)   Body mass index is 26.5 kg/m. Physical Exam  Labs reviewed: Recent Labs    04/26/22 1132 09/02/22 0000 11/29/22 0000  NA 139 142 145  K 3.5 4.1 3.7  CL 102 101 108   CO2 30 38* 34*  GLUCOSE 141*  --   --   BUN 18 14 22*  CREATININE 0.58 0.7 0.6  CALCIUM 9.0 9.5 8.8   Recent Labs    04/26/22 1132 09/02/22 0000 11/29/22 0000  AST 21 18 14   ALT 14 11 6*  ALKPHOS 66 52 43  BILITOT 1.0  --   --   PROT 6.6  --   --   ALBUMIN 3.7 3.6 3.1*   Recent Labs    04/26/22 1132 07/13/22 1330 07/15/22 0000 09/02/22 0000 11/29/22 0000  WBC 6.1 5.2 5.6 6.2 6.8  NEUTROABS 3.9 3.2 3,046.00  --  3,386.00  HGB 13.1 13.4 12.3 13.0 11.1*  HCT 40.1 40.7 37 39 34*  MCV 94.8 94.9  --   --   --   PLT 104* 119* 108* 96* 98*   Lab Results  Component Value Date   TSH 0.50 08/31/2022   Lab Results  Component Value Date   HGBA1C 5.6 01/27/2018   Lab Results  Component Value Date   CHOL 146 06/30/2022   HDL 42 06/30/2022   LDLCALC 79 06/30/2022   TRIG 155 06/30/2022   CHOLHDL 3.7 01/28/2018    Significant Diagnostic Results in last 30 days:  No results found.  Assessment/Plan There are no diagnoses linked to this encounter.   Family/ staff Communication: ***  Labs/tests ordered:  ***

## 2022-12-29 DIAGNOSIS — G40909 Epilepsy, unspecified, not intractable, without status epilepticus: Secondary | ICD-10-CM | POA: Diagnosis not present

## 2022-12-29 DIAGNOSIS — I10 Essential (primary) hypertension: Secondary | ICD-10-CM | POA: Diagnosis not present

## 2022-12-29 DIAGNOSIS — D649 Anemia, unspecified: Secondary | ICD-10-CM | POA: Diagnosis not present

## 2022-12-29 DIAGNOSIS — E039 Hypothyroidism, unspecified: Secondary | ICD-10-CM | POA: Diagnosis not present

## 2022-12-29 DIAGNOSIS — I130 Hypertensive heart and chronic kidney disease with heart failure and stage 1 through stage 4 chronic kidney disease, or unspecified chronic kidney disease: Secondary | ICD-10-CM | POA: Diagnosis not present

## 2022-12-29 LAB — BASIC METABOLIC PANEL
BUN: 18 (ref 4–21)
CO2: 36 — AB (ref 13–22)
Chloride: 102 (ref 99–108)
Creatinine: 0.6 (ref 0.5–1.1)
Glucose: 74
Potassium: 3.4 mEq/L — AB (ref 3.5–5.1)
Sodium: 141 (ref 137–147)

## 2022-12-29 LAB — CBC AND DIFFERENTIAL
HCT: 36 (ref 36–46)
Hemoglobin: 11.8 — AB (ref 12.0–16.0)
Neutrophils Absolute: 2268
Platelets: 81 10*3/uL — AB (ref 150–400)
WBC: 5.3

## 2022-12-29 LAB — COMPREHENSIVE METABOLIC PANEL
Albumin: 3.2 — AB (ref 3.5–5.0)
Calcium: 9 (ref 8.7–10.7)
Globulin: 2
eGFR: 88

## 2022-12-29 LAB — HEPATIC FUNCTION PANEL
ALT: 9 U/L (ref 7–35)
AST: 15 (ref 13–35)
Alkaline Phosphatase: 39 (ref 25–125)
Bilirubin, Total: 0.6

## 2022-12-29 LAB — CBC: RBC: 3.67 — AB (ref 3.87–5.11)

## 2022-12-30 ENCOUNTER — Encounter: Payer: Self-pay | Admitting: Nurse Practitioner

## 2022-12-30 ENCOUNTER — Encounter: Payer: Self-pay | Admitting: Oncology

## 2022-12-30 ENCOUNTER — Encounter: Payer: Self-pay | Admitting: Student

## 2023-01-04 ENCOUNTER — Encounter: Payer: Self-pay | Admitting: Nurse Practitioner

## 2023-01-04 ENCOUNTER — Non-Acute Institutional Stay (SKILLED_NURSING_FACILITY): Payer: Medicare PPO | Admitting: Nurse Practitioner

## 2023-01-04 DIAGNOSIS — Q845 Enlarged and hypertrophic nails: Secondary | ICD-10-CM | POA: Diagnosis not present

## 2023-01-04 NOTE — Progress Notes (Signed)
Location:  [facility].  Nursing Home Room Number: [facility] [address] Place of Service:  SNF [phone]) Abbey Chatters, NP  PCP: Earnestine Mealing, MD  Patient Care Team: Earnestine Mealing, MD as PCP - General (Family Medicine) Mariah Milling, Tollie Pizza, MD as Consulting Physician (Cardiology) Chilton Si Chong Sicilian, NP as Nurse Practitioner (Geriatric Medicine)  Extended Emergency Contact Information Primary Emergency Contact: Sano,[name] Address: [address]          [place], [place] [address] Darden Amber of [place] Phone: [phone] Relation: Daughter Secondary Emergency Contact: Freda Munro Address: [address]          [place], [place] [address] Darden Amber of [place] Home Phone: [phone] Mobile Phone: [phone] Relation: Spouse  Goals of care: Advanced Directive information    01/04/2023    3:33 PM  Advanced Directives  Does Patient Have a Medical Advance Directive? Yes  Type of Estate agent of [place];Out of facility DNR (pink MOST or yellow form);Living will  Does patient want to make changes to medical advance directive? No - Patient declined  Copy of Healthcare Power of Attorney in Chart? Yes - validated most recent copy scanned in chart (See row information)     Chief Complaint  Patient presents with   Acute Visit    Long FingerNails.     HPI:  Pt is a 87 y.o. female seen today for an acute visit for Long Fingernails per nursing.  Nursing reports they are unable to trim long nails that have fungus.   Past Medical History:  Diagnosis Date   Cerebral infarction [facility])    CKD (chronic kidney disease), stage III (HCC)    Depression    DJD (degenerative joint disease)    Edema    High cholesterol    Hyperlipidemia, unspecified    Hypertension    Hypothyroidism    Lower GI bleed 01/31/2019   Last Assessment & Plan:  Formatting of this note might be different from the original. Noted several episodes here and  at [facility]. Was on dual antiplatelets and still is but asa dose was lowered. She is doing well now and in good spirits today in her room in isolation given covid. No abd pain and eating well with normal bm's per staff.   Malaise    Morbid obesity (HCC)    Osteoarthritis    Parathyroid abnormality (HCC)    Seizures (HCC)    a. following remote stroke.   Stroke [facility] 2002, 05/2017, 01/26/18, 01/30/18   a. 1964-->residual right arm wkns.  Previously on coumadin - pt says for just a few yrs.   Subdural hematoma (HCC)    a. 10/2001 [facility] req temporal frontoparietal craniotomy following fall. ? whether or not pt on coumadin @ time.  Notes indicate yes but pt denies.   Thyroid disease    TMJ (dislocation of temporomandibular joint)    Tuberculosis    a. ~ 1950   Urinary incontinence    Weight loss    Past Surgical History:  Procedure Laterality Date   ABDOMINAL HYSTERECTOMY     BACK SURGERY     BRAIN SURGERY     cataract surgery     2014   LEFT HEART CATH AND CORONARY ANGIOGRAPHY N/A 06/13/2017   Procedure: LEFT HEART CATH AND CORONARY ANGIOGRAPHY;  Surgeon: Iran Ouch, MD;  Location: [facility] INVASIVE CV LAB;  Service: Cardiovascular;  Laterality: N/A;   PARATHYROID ADENOMA REMOVAL     TEE WITHOUT CARDIOVERSION N/A 01/31/2018  Procedure: TRANSESOPHAGEAL ECHOCARDIOGRAM (TEE);  Surgeon: Yvonne Kendall, MD;  Location: [facility] ORS;  Service: Cardiovascular;  Laterality: N/A;   TEE WITHOUT CARDIOVERSION N/A 02/01/2018   Procedure: TRANSESOPHAGEAL ECHOCARDIOGRAM (TEE);  Surgeon: Antonieta Iba, MD;  Location: [facility] ORS;  Service: Cardiovascular;  Laterality: N/A;    No Known Allergies  Outpatient Encounter Medications as of 01/04/2023  Medication Sig   acetaminophen (TYLENOL) 325 MG tablet Take 650 mg by mouth every 4 (four) hours as needed. See other listing   acetaminophen (TYLENOL) 325 MG tablet Take 650 mg by mouth 3 (three) times daily. Scheduled, see as needed listing   alum & mag  hydroxide-simeth (MAALOX/MYLANTA) 200-200-20 MG/5ML suspension Take 30 mLs by mouth every 4 (four) hours as needed for indigestion or heartburn.   atorvastatin (LIPITOR) 80 MG tablet Take 1 tablet (80 mg total) by mouth daily at 6 PM.   bisacodyl (DULCOLAX) 10 MG suppository Place 10 mg rectally as needed for moderate constipation.   citalopram (CELEXA) 10 MG tablet Take 10 mg by mouth daily.   clopidogrel (PLAVIX) 75 MG tablet Take 1 tablet (75 mg total) by mouth daily.   divalproex (DEPAKOTE SPRINKLE) 125 MG capsule Take 250 mg by mouth 2 (two) times daily.   guaiFENesin (TUSSIN PO) Take 10 mLs by mouth every 4 (four) hours as needed (cough).   hydrochlorothiazide (MICROZIDE) 12.5 MG capsule Take 12.5 mg by mouth daily.   hydrocortisone 2.5 % cream Apply 1 Application topically every 8 (eight) hours as needed (for hemorrhoids).   Infant Care Products [facility]) OINT Apply 1 application  topically every 12 (twelve) hours as needed (apply to axilla, groin, and under breast).   latanoprost (XALATAN) 0.005 % ophthalmic solution Place 1 drop into both eyes at bedtime.    levothyroxine (SYNTHROID) 88 MCG tablet Take 88 mcg by mouth daily. At bedtime   lisinopril (ZESTRIL) 20 MG tablet Take 20 mg by mouth daily.   magnesium hydroxide (MILK OF MAGNESIA) 400 MG/5ML suspension Take 30 mLs by mouth every 12 (twelve) hours as needed for mild constipation.   Menthol, Topical Analgesic, (BIOFREEZE) 4 % GEL Apply 1 Application topically every 8 (eight) hours as needed (For knee pain, apply to both knees).   NYAMYC powder Apply 1 Application topically every 12 (twelve) hours as needed (for skin breakdown, apply under breast).   polyethylene glycol (MIRALAX / GLYCOLAX) packet Take 17 g by mouth daily as needed.   tamsulosin (FLOMAX) 0.4 MG CAPS capsule Take 1 capsule (0.4 mg total) by mouth daily.   traMADol (ULTRAM) 50 MG tablet TAKE 1 TABLET BY MOUTH TWICE DAILY;TAKE 1 TABLET BY MOUTH EVERY SIX HOURS AS  NEEDED FOR INCREASED PAIN   No facility-administered encounter medications on file as of 01/04/2023.    Review of Systems  Unable to perform ROS: Dementia    Immunization History  Administered Date(s) Administered   Influenza Split 07/18/2014, 07/23/2015   Influenza, High Dose Seasonal PF 06/14/2017, 06/15/2022   Influenza-Unspecified 05/25/2012, 05/31/2016, 06/01/2018, 06/21/2021   Moderna Covid-19 Vaccine Bivalent Booster 26yrs & up 01/26/2022   Moderna SARS-COV2 Booster Vaccination 07/11/2020, 01/16/2021   Moderna Sars-Covid-2 Vaccination 09/19/2019, 10/17/2019   PFIZER Comirnaty(Gray Top)Covid-19 Tri-Sucrose Vaccine 07/09/2022   Pfizer Covid-19 Vaccine Bivalent Booster 31yrs & up 05/22/2021   Pneumococcal Polysaccharide-23 08/30/2004, 08/04/2017, 02/15/2019   Pertinent  Health Maintenance Due  Topic Date Due   INFLUENZA VACCINE  03/31/2023   DEXA SCAN  Discontinued      01/29/2019   10:30 PM  01/30/2019    2:00 PM 04/26/2022   10:00 AM 05/19/2022    9:12 AM 06/02/2022    2:12 PM  Fall Risk  Falls in the past year?    0   Was there an injury with Fall?    0   Fall Risk Category Calculator    0   Fall Risk Category (Retired)    Low   (RETIRED) Patient Fall Risk Level High fall risk High fall risk High fall risk Moderate fall risk High fall risk  Patient at Risk for Falls Due to    History of fall(s)   Fall risk Follow up    Falls evaluation completed    Functional Status Survey:    Vitals:   01/04/23 1525  BP: (!) 109/58  Pulse: 65  Resp: 16  Temp: (!) 96.7 F (35.9 C)  SpO2: 94%  Weight: 153 lb 3.2 oz (69.5 kg)  Height: 5\' 4"  (1.626 m)   Body mass index is 26.3 kg/m. Physical Exam Constitutional:      Appearance: Normal appearance.  Pulmonary:     Effort: Pulmonary effort is normal.  Skin:    Comments: Long overgrown nails to right hand with fungus to nails.   Neurological:     Mental Status: She is alert. Mental status is at baseline.  Psychiatric:         Mood and Affect: Mood normal.     Labs reviewed: Recent Labs    04/26/22 1132 09/02/22 0000 11/29/22 0000 12/29/22 0000  NA 139 142 145 141  K 3.5 4.1 3.7 3.4*  CL 102 101 108 102  CO2 30 38* 34* 36*  GLUCOSE 141*  --   --   --   BUN 18 14 22* 18  CREATININE 0.58 0.7 0.6 0.6  CALCIUM 9.0 9.5 8.8 9.0   Recent Labs    04/26/22 1132 09/02/22 0000 11/29/22 0000 12/29/22 0000  AST 21 18 14 15   ALT 14 11 6* 9  ALKPHOS 66 52 43 39  BILITOT 1.0  --   --   --   PROT 6.6  --   --   --   ALBUMIN 3.7 3.6 3.1* 3.2*   Recent Labs    04/26/22 1132 07/13/22 1330 07/15/22 0000 09/02/22 0000 11/29/22 0000 12/29/22 0000  WBC 6.1 5.2 5.6 6.2 6.8 5.3  NEUTROABS 3.9 3.2 3,046.00  --  3,386.00 2,268.00  HGB 13.1 13.4 12.3 13.0 11.1* 11.8*  HCT 40.1 40.7 37 39 34* 36  MCV 94.8 94.9  --   --   --   --   PLT 104* 119* 108* 96* 98* 81*   Lab Results  Component Value Date   TSH 0.50 08/31/2022   Lab Results  Component Value Date   HGBA1C 5.6 01/27/2018   Lab Results  Component Value Date   CHOL 146 06/30/2022   HDL 42 06/30/2022   LDLCALC 79 06/30/2022   TRIG 155 06/30/2022   CHOLHDL 3.7 01/28/2018    Significant Diagnostic Results in last 30 days:  No results found.  Assessment/Plan 1. Enlarged and hypertrophic nails Nails on right hand trimmed with clippers, pt tolerated well.   Janene Harvey. Biagio Borg [facility] [phone]

## 2023-01-27 DIAGNOSIS — I10 Essential (primary) hypertension: Secondary | ICD-10-CM | POA: Diagnosis not present

## 2023-01-28 ENCOUNTER — Encounter: Payer: Self-pay | Admitting: Student

## 2023-02-03 ENCOUNTER — Non-Acute Institutional Stay (INDEPENDENT_AMBULATORY_CARE_PROVIDER_SITE_OTHER): Payer: Medicare PPO | Admitting: Nurse Practitioner

## 2023-02-03 ENCOUNTER — Encounter: Payer: Self-pay | Admitting: Nurse Practitioner

## 2023-02-03 DIAGNOSIS — Z0000 Encounter for general adult medical examination without abnormal findings: Secondary | ICD-10-CM

## 2023-02-03 NOTE — Patient Instructions (Signed)
  Shawna [name] , Thank you for taking time to come for your Medicare Wellness Visit. I appreciate your ongoing commitment to your health goals. Please review the following plan we discussed and let me know if I can assist you in the future.   These are the goals we discussed:  Goals   None     This is a list of the screening recommended for you and due dates:  Health Maintenance  Topic Date Due   DTaP/Tdap/Td vaccine (1 - Tdap) Never done   Zoster (Shingles) Vaccine (1 of 2) Never done   Pneumonia Vaccine (2 of 2 - PCV) 02/15/2020   COVID-19 Vaccine (8 - 2023-24 season) 09/03/2022   Flu Shot  03/31/2023   Medicare Annual Wellness Visit  02/03/2024   HPV Vaccine  Aged Out   DEXA scan (bone density measurement)  Discontinued

## 2023-02-03 NOTE — Progress Notes (Signed)
Subjective:   Shawna [name] is a 87 y.o. female who presents for Medicare Annual (Subsequent) preventive examination.  Review of Systems     Cardiac Risk Factors include: advanced age (>32men, >15 women);hypertension;dyslipidemia;sedentary lifestyle     Objective:    Today's Vitals   02/03/23 1548  BP: 137/82  Pulse: 68  Resp: 12  Temp: 98 F (36.7 C)  SpO2: 90%  Weight: 155 lb 6.4 oz (70.5 kg)  Height: 5\' 4"  (1.626 m)   Body mass index is 26.67 kg/m.     02/03/2023    4:02 PM 01/04/2023    3:33 PM 12/17/2022    9:09 AM 10/12/2022   11:01 AM 08/10/2022    3:31 PM 07/01/2022   12:08 PM 06/14/2022    1:42 PM  Advanced Directives  Does Patient Have a Medical Advance Directive? Yes Yes Yes Yes Yes Yes Yes  Type of Estate agent of [place];Out of facility DNR (pink MOST or yellow form);Living will Healthcare Power of [place];Out of facility DNR (pink MOST or yellow form);Living will Healthcare Power of [place];Out of facility DNR (pink MOST or yellow form);Living will Healthcare Power of [place];Living will;Out of facility DNR (pink MOST or yellow form) Healthcare Power of [place];Living will;Out of facility DNR (pink MOST or yellow form) Out of facility DNR (pink MOST or yellow form) Out of facility DNR (pink MOST or yellow form)  Does patient want to make changes to medical advance directive? No - Patient declined No - Patient declined No - Patient declined No - Patient declined No - Patient declined No - Patient declined No - Patient declined  Copy of Healthcare Power of Attorney in Chart? Yes - validated most recent copy scanned in chart (See row information) Yes - validated most recent copy scanned in chart (See row information) Yes - validated most recent copy scanned in chart (See row information) Yes - validated most recent copy scanned in chart (See row information) No - copy requested  Yes - validated most recent copy scanned in chart (See row  information)  Pre-existing out of facility DNR order (yellow form or pink MOST form)    Yellow form placed in chart (order not valid for inpatient use);Pink MOST form placed in chart (order not valid for inpatient use) Pink MOST/Yellow Form most recent copy in chart - Physician notified to receive inpatient order Pink MOST form placed in chart (order not valid for inpatient use);Yellow form placed in chart (order not valid for inpatient use) Yellow form placed in chart (order not valid for inpatient use);Pink MOST form placed in chart (order not valid for inpatient use)    Current Medications (verified) Outpatient Encounter Medications as of 02/03/2023  Medication Sig   acetaminophen (TYLENOL) 325 MG tablet Take 650 mg by mouth every 4 (four) hours as needed. See other listing   acetaminophen (TYLENOL) 325 MG tablet Take 650 mg by mouth 3 (three) times daily. Scheduled, see as needed listing   alum & mag hydroxide-simeth (MAALOX/MYLANTA) 200-200-20 MG/5ML suspension Take 30 mLs by mouth every 4 (four) hours as needed for indigestion or heartburn.   atorvastatin (LIPITOR) 80 MG tablet Take 1 tablet (80 mg total) by mouth daily at 6 PM.   bisacodyl (DULCOLAX) 10 MG suppository Place 10 mg rectally as needed for moderate constipation.   citalopram (CELEXA) 10 MG tablet Take 10 mg by mouth daily.   clopidogrel (PLAVIX) 75 MG tablet Take 1 tablet (75 mg total) by mouth daily.  divalproex (DEPAKOTE SPRINKLE) 125 MG capsule Take 250 mg by mouth 2 (two) times daily.   guaiFENesin (TUSSIN PO) Take 10 mLs by mouth every 4 (four) hours as needed (cough).   hydrochlorothiazide (MICROZIDE) 12.5 MG capsule Take 12.5 mg by mouth daily.   hydrocortisone 2.5 % cream Apply 1 Application topically every 8 (eight) hours as needed (for hemorrhoids).   Infant Care Products [facility]) OINT Apply 1 application  topically every 12 (twelve) hours as needed (apply to axilla, groin, and under breast).   latanoprost  (XALATAN) 0.005 % ophthalmic solution Place 1 drop into both eyes at bedtime.    levothyroxine (SYNTHROID) 88 MCG tablet Take 88 mcg by mouth daily. At bedtime   lisinopril (ZESTRIL) 20 MG tablet Take 20 mg by mouth daily.   magnesium hydroxide (MILK OF MAGNESIA) 400 MG/5ML suspension Take 30 mLs by mouth every 12 (twelve) hours as needed for mild constipation.   Menthol, Topical Analgesic, (BIOFREEZE) 4 % GEL Apply 1 Application topically every 8 (eight) hours as needed (For knee pain, apply to both knees).   NYAMYC powder Apply 1 Application topically every 12 (twelve) hours as needed (for skin breakdown, apply under breast).   polyethylene glycol (MIRALAX / GLYCOLAX) packet Take 17 g by mouth daily as needed.   sodium fluoride (SODIUM FLUORIDE 5000 PLUS) 1.1 % CREA dental cream Place 1 Application onto teeth at bedtime.   tamsulosin (FLOMAX) 0.4 MG CAPS capsule Take 1 capsule (0.4 mg total) by mouth daily.   traMADol (ULTRAM) 50 MG tablet TAKE 1 TABLET BY MOUTH TWICE DAILY;TAKE 1 TABLET BY MOUTH EVERY SIX HOURS AS NEEDED FOR INCREASED PAIN   No facility-administered encounter medications on file as of 02/03/2023.    Allergies (verified) Patient has no known allergies.   History: Past Medical History:  Diagnosis Date   Cerebral infarction (HCC)    CKD (chronic kidney disease), stage III (HCC)    Depression    DJD (degenerative joint disease)    Edema    High cholesterol    Hyperlipidemia, unspecified    Hypertension    Hypothyroidism    Lower GI bleed 01/31/2019   Last Assessment & Plan:  Formatting of this note might be different from the original. Noted several episodes here and at [facility]. Was on dual antiplatelets and still is but asa dose was lowered. She is doing well now and in good spirits today in her room in isolation given covid. No abd pain and eating well with normal bm's per staff.   Malaise    Morbid obesity (HCC)    Osteoarthritis    Parathyroid abnormality (HCC)     Seizures (HCC)    a. following remote stroke.   Stroke [facility]) 2002, 05/2017, 01/26/18, 01/30/18   a. 1964-->residual right arm wkns.  Previously on coumadin - pt says for just a few yrs.   Subdural hematoma (HCC)    a. 10/2001 [facility] req temporal frontoparietal craniotomy following fall. ? whether or not pt on coumadin @ time.  Notes indicate yes but pt denies.   Thyroid disease    TMJ (dislocation of temporomandibular joint)    Tuberculosis    a. ~ 1950   Urinary incontinence    Weight loss    Past Surgical History:  Procedure Laterality Date   ABDOMINAL HYSTERECTOMY     BACK SURGERY     BRAIN SURGERY     cataract surgery     2014   LEFT HEART CATH AND CORONARY ANGIOGRAPHY  N/A 06/13/2017   Procedure: LEFT HEART CATH AND CORONARY ANGIOGRAPHY;  Surgeon: Iran Ouch, MD;  Location: [facility] INVASIVE CV LAB;  Service: Cardiovascular;  Laterality: N/A;   PARATHYROID ADENOMA REMOVAL     TEE WITHOUT CARDIOVERSION N/A 01/31/2018   Procedure: TRANSESOPHAGEAL ECHOCARDIOGRAM (TEE);  Surgeon: Yvonne Kendall, MD;  Location: [facility] ORS;  Service: Cardiovascular;  Laterality: N/A;   TEE WITHOUT CARDIOVERSION N/A 02/01/2018   Procedure: TRANSESOPHAGEAL ECHOCARDIOGRAM (TEE);  Surgeon: Antonieta Iba, MD;  Location: [facility] ORS;  Service: Cardiovascular;  Laterality: N/A;   Family History  Problem Relation Age of Onset   Heart failure Mother        died @ 45   Heart attack Father        died @ 72   Stroke Father    Hypertension Sister    Diabetes Sister    Hypertension Brother    Diabetes Brother    Breast cancer Neg Hx    Social History   Socioeconomic History   Marital status: Married    Spouse name: Fayrene Fearing   Number of children: 2   Years of education: BS   Highest education level: Not on file  Occupational History   Not on file  Tobacco Use   Smoking status: Never   Smokeless tobacco: Never  Vaping Use   Vaping Use: Never used  Substance and Sexual Activity   Alcohol use: No   Drug  use: No   Sexual activity: Not on file  Other Topics Concern   Not on file  Social History Narrative   [place]  Active around the house.  Does not routinely exercise or drive.  Does own grocery shopping.  10/18/2018 residing at [facility]	[place]   Social Determinants of Health   Financial Resource Strain: Low Risk  (01/29/2019)   Overall Financial Resource Strain (CARDIA)    Difficulty of Paying Living Expenses: Not hard at all  Food Insecurity: Unknown (01/29/2019)   Hunger Vital Sign    Worried About Running Out of Food in the Last Year: Patient declined    Ran Out of Food in the Last Year: Patient declined  Transportation Needs: Unknown (01/29/2019)   PRAPARE - Administrator, Civil Service (Medical): Patient declined    Lack of Transportation (Non-Medical): Patient declined  Physical Activity: Unknown (01/29/2019)   Exercise Vital Sign    Days of Exercise per Week: Patient declined    Minutes of Exercise per Session: Patient declined  Stress: Not on file  Social Connections: Unknown (01/29/2019)   Social Connection and Isolation Panel [NHANES]    Frequency of Communication with Friends and Family: Patient declined    Frequency of Social Gatherings with Friends and Family: Patient declined    Attends Religious Services: Patient declined    Database administrator or Organizations: Patient declined    Attends Engineer, structural: Patient declined    Marital Status: Patient declined    Tobacco Counseling Counseling given: Not Answered   Clinical Intake:  Pre-visit preparation completed: Yes  Pain : No/denies pain     BMI - recorded: 26 Nutritional Status: BMI 25 -29 Overweight Nutritional Risks: None Diabetes: No  How often do you need to have someone help you when you read instructions, pamphlets, or other written materials from your doctor or pharmacy?: 5 - Always  Diabetic?no         Activities of  Daily Living  02/03/2023    3:01 PM  In your present state of health, do you have any difficulty performing the following activities:  Hearing? 0  Vision? 0  Difficulty concentrating or making decisions? 1  Walking or climbing stairs? 1  Dressing or bathing? 1  Doing errands, shopping? 1  Preparing Food and eating ? Y  Using the Toilet? Y  In the past six months, have you accidently leaked urine? Y  Do you have problems with loss of bowel control? Y  Managing your Medications? Y  Managing your Finances? Y  Housekeeping or managing your Housekeeping? Y    Patient Care Team: Earnestine Mealing, MD as PCP - General (Family Medicine) Mariah Milling Tollie Pizza, MD as Consulting Physician (Cardiology) Sharee Holster, NP as Nurse Practitioner (Geriatric Medicine)  Indicate any recent Medical Services you may have received from other than [facility] providers in the past year (date may be approximate).     Assessment:   This is a routine wellness examination for Shawna [name].  Hearing/Vision screen No results found.  Dietary issues and exercise activities discussed: Current Exercise Habits: The patient does not participate in regular exercise at present   Goals Addressed   None    Depression Screen    02/03/2023    3:02 PM  PHQ 2/9 Scores  Exception Documentation Other- indicate reason in comment box    Fall Risk    02/03/2023    3:02 PM 05/19/2022    9:12 AM  Fall Risk   Falls in the past year? 0 0  Number falls in past yr:  0  Injury with Fall?  0  Risk for fall due to :  History of fall(s)  Follow up  Falls evaluation completed    FALL RISK PREVENTION PERTAINING TO THE HOME:  Any stairs in or around the home? No  If so, are there any without handrails? na Home free of loose throw rugs in walkways, pet beds, electrical cords, etc? Yes  Adequate lighting in your home to reduce risk of falls? Yes   ASSISTIVE DEVICES UTILIZED TO PREVENT FALLS:  Life alert? No  Use of a cane,  walker or w/c? Yes  Grab bars in the bathroom? Yes  Shower chair or bench in shower? Yes  Elevated toilet seat or a handicapped toilet? Yes   TIMED UP AND GO: na Cognitive Function:        Immunizations Immunization History  Administered Date(s) Administered   Influenza Split 07/18/2014, 07/23/2015   Influenza, High Dose Seasonal PF 06/14/2017, 06/15/2022   Influenza-Unspecified 05/25/2012, 05/31/2016, 06/01/2018, 06/21/2021   Moderna Covid-19 Vaccine Bivalent Booster 58yrs & up 01/26/2022   Moderna SARS-COV2 Booster Vaccination 07/11/2020, 01/16/2021   Moderna Sars-Covid-2 Vaccination 09/19/2019, 10/17/2019   PFIZER Comirnaty(Gray Top)Covid-19 Tri-Sucrose Vaccine 07/09/2022   Pfizer Covid-19 Vaccine Bivalent Booster 78yrs & up 05/22/2021   Pneumococcal Polysaccharide-23 08/30/2004, 08/04/2017, 02/15/2019    TDAP status: Due, Education has been provided regarding the importance of this vaccine. Advised may receive this vaccine at local pharmacy or Health Dept. Aware to provide a copy of the vaccination record if obtained from local pharmacy or Health Dept. Verbalized acceptance and understanding.  Flu Vaccine status: Due, Education has been provided regarding the importance of this vaccine. Advised may receive this vaccine at local pharmacy or Health Dept. Aware to provide a copy of the vaccination record if obtained from local pharmacy or Health Dept. Verbalized acceptance and understanding.  Pneumococcal vaccine status: Due, Education has been provided regarding the  importance of this vaccine. Advised may receive this vaccine at local pharmacy or Health Dept. Aware to provide a copy of the vaccination record if obtained from local pharmacy or Health Dept. Verbalized acceptance and understanding.  Covid-19 vaccine status: Information provided on how to obtain vaccines.   Qualifies for Shingles Vaccine? Yes   Zostavax completed No   Shingrix Completed?: No.    Education has been  provided regarding the importance of this vaccine. Patient has been advised to call insurance company to determine out of pocket expense if they have not yet received this vaccine. Advised may also receive vaccine at local pharmacy or Health Dept. Verbalized acceptance and understanding.  Screening Tests Health Maintenance  Topic Date Due   DTaP/Tdap/Td (1 - Tdap) Never done   Zoster Vaccines- Shingrix (1 of 2) Never done   Pneumonia Vaccine 108+ Years old (2 of 2 - PCV) 02/15/2020   COVID-19 Vaccine (8 - 2023-24 season) 09/03/2022   INFLUENZA VACCINE  03/31/2023   Medicare Annual Wellness (AWV)  02/03/2024   HPV VACCINES  Aged Out   DEXA SCAN  Discontinued    Health Maintenance  Health Maintenance Due  Topic Date Due   DTaP/Tdap/Td (1 - Tdap) Never done   Zoster Vaccines- Shingrix (1 of 2) Never done   Pneumonia Vaccine 5+ Years old (2 of 2 - PCV) 02/15/2020   COVID-19 Vaccine (8 - 2023-24 season) 09/03/2022    Colorectal cancer screening: No longer required.   Mammogram status: No longer required due to age.  Not candidate for bone density due to immobility  Lung Cancer Screening: (Low Dose CT Chest recommended if Age 62-80 years, 30 pack-year currently smoking OR have quit w/in 15years.) does not qualify.   Lung Cancer Screening Referral: na  Additional Screening:  Hepatitis C Screening: does not qualify  Vision Screening: Recommended annual ophthalmology exams for early detection of glaucoma and other disorders of the eye. Is the patient up to date with their annual eye exam?  No  Due to dementia   Dental Screening: Recommended annual dental exams for proper oral hygiene  Community Resource Referral / Chronic Care Management: CRR required this visit?  No   CCM required this visit?  No      Plan:     I have personally reviewed and noted the following in the patient's chart:   Medical and social history Use of alcohol, tobacco or illicit drugs  Current  medications and supplements including opioid prescriptions. Patient is currently taking opioid prescriptions. Information provided to patient regarding non-opioid alternatives. Patient advised to discuss non-opioid treatment plan with their provider. Functional ability and status Nutritional status Physical activity Advanced directives List of other physicians Hospitalizations, surgeries, and ER visits in previous 12 months Vitals Screenings to include cognitive, depression, and falls Referrals and appointments  In addition, I have reviewed and discussed with patient certain preventive protocols, quality metrics, and best practice recommendations. A written personalized care plan for preventive services as well as general preventive health recommendations were provided to patient.     Sharon Seller, NP   02/03/2023  Place of service: [facility]

## 2023-02-08 ENCOUNTER — Non-Acute Institutional Stay (SKILLED_NURSING_FACILITY): Payer: Medicare PPO | Admitting: Nurse Practitioner

## 2023-02-08 ENCOUNTER — Encounter: Payer: Self-pay | Admitting: Nurse Practitioner

## 2023-02-08 DIAGNOSIS — R339 Retention of urine, unspecified: Secondary | ICD-10-CM | POA: Diagnosis not present

## 2023-02-08 DIAGNOSIS — I10 Essential (primary) hypertension: Secondary | ICD-10-CM | POA: Diagnosis not present

## 2023-02-08 DIAGNOSIS — E034 Atrophy of thyroid (acquired): Secondary | ICD-10-CM

## 2023-02-08 DIAGNOSIS — I5032 Chronic diastolic (congestive) heart failure: Secondary | ICD-10-CM

## 2023-02-08 DIAGNOSIS — F321 Major depressive disorder, single episode, moderate: Secondary | ICD-10-CM | POA: Diagnosis not present

## 2023-02-08 DIAGNOSIS — D696 Thrombocytopenia, unspecified: Secondary | ICD-10-CM | POA: Diagnosis not present

## 2023-02-08 DIAGNOSIS — Q845 Enlarged and hypertrophic nails: Secondary | ICD-10-CM

## 2023-02-08 DIAGNOSIS — Z8673 Personal history of transient ischemic attack (TIA), and cerebral infarction without residual deficits: Secondary | ICD-10-CM

## 2023-02-08 DIAGNOSIS — F0150 Vascular dementia without behavioral disturbance: Secondary | ICD-10-CM

## 2023-02-08 NOTE — Progress Notes (Unsigned)
Location:  Other [facility] Home Room Number: [facility] [address]A Place of Service:  SNF [phone]) Abbey Chatters, NP  PCP: Earnestine Mealing, MD  Patient Care Team: Earnestine Mealing, MD as PCP - General (Family Medicine) Mariah Milling, Tollie Pizza, MD as Consulting Physician (Cardiology) Sharee Holster, NP as Nurse Practitioner (Geriatric Medicine)  Extended Emergency Contact Information Primary Emergency Contact: Yonke,[name] Address: [address]          [place], [place] [address] Darden Amber of [place] Phone: [phone] Relation: Daughter Secondary Emergency Contact: Freda Munro Address: [address]          [place], [place] [address] Darden Amber of [place] Home Phone: [phone] Mobile Phone: [phone] Relation: Spouse  Goals of care: Advanced Directive information    02/08/2023    3:16 PM  Advanced Directives  Does Patient Have a Medical Advance Directive? Yes  Type of Estate agent of [place];Out of facility DNR (pink MOST or yellow form);Living will  Does patient want to make changes to medical advance directive? No - Patient declined  Copy of Healthcare Power of Attorney in Chart? Yes - validated most recent copy scanned in chart (See row information)     Chief Complaint  Patient presents with   Medical Management of Chronic Issues    Medical Management of Chronic Issues.     HPI:  Pt is a 87 y.o. female seen today for medical management of chronic disease.  Pt  with hx of CVA, CKD, depression, hyperlipidemia, htn She has been a long term resident of [facility]. Has behaviors that have typically been well controlled on depakote. More behaviors at night noted by staff.  Nails continues to be long and overgrown with fungal disease making it hard for staff to cut.      Past Medical History:  Diagnosis Date   Cerebral infarction [facility])    CKD (chronic kidney disease), stage III (HCC)    Depression     DJD (degenerative joint disease)    Edema    High cholesterol    Hyperlipidemia, unspecified    Hypertension    Hypothyroidism    Lower GI bleed 01/31/2019   Last Assessment & Plan:  Formatting of this note might be different from the original. Noted several episodes here and at [facility]. Was on dual antiplatelets and still is but asa dose was lowered. She is doing well now and in good spirits today in her room in isolation given covid. No abd pain and eating well with normal bm's per staff.   Malaise    Morbid obesity (HCC)    Osteoarthritis    Parathyroid abnormality (HCC)    Seizures (HCC)    a. following remote stroke.   Stroke [facility]) 2002, 05/2017, 01/26/18, 01/30/18   a. 1964-->residual right arm wkns.  Previously on coumadin - pt says for just a few yrs.   Subdural hematoma (HCC)    a. 10/2001 [facility] req temporal frontoparietal craniotomy following fall. ? whether or not pt on coumadin @ time.  Notes indicate yes but pt denies.   Thyroid disease    TMJ (dislocation of temporomandibular joint)    Tuberculosis    a. ~ 1950   Urinary incontinence    Weight loss    Past Surgical History:  Procedure Laterality Date   ABDOMINAL HYSTERECTOMY     BACK SURGERY     BRAIN SURGERY     cataract surgery     2014   LEFT  HEART CATH AND CORONARY ANGIOGRAPHY N/A 06/13/2017   Procedure: LEFT HEART CATH AND CORONARY ANGIOGRAPHY;  Surgeon: Iran Ouch, MD;  Location: [facility] INVASIVE CV LAB;  Service: Cardiovascular;  Laterality: N/A;   PARATHYROID ADENOMA REMOVAL     TEE WITHOUT CARDIOVERSION N/A 01/31/2018   Procedure: TRANSESOPHAGEAL ECHOCARDIOGRAM (TEE);  Surgeon: Yvonne Kendall, MD;  Location: [facility] ORS;  Service: Cardiovascular;  Laterality: N/A;   TEE WITHOUT CARDIOVERSION N/A 02/01/2018   Procedure: TRANSESOPHAGEAL ECHOCARDIOGRAM (TEE);  Surgeon: Antonieta Iba, MD;  Location: [facility] ORS;  Service: Cardiovascular;  Laterality: N/A;    No Known Allergies  Outpatient Encounter Medications as of  02/08/2023  Medication Sig   acetaminophen (TYLENOL) 325 MG tablet Take 650 mg by mouth every 4 (four) hours as needed. See other listing   acetaminophen (TYLENOL) 325 MG tablet Take 650 mg by mouth 3 (three) times daily. Scheduled, see as needed listing   alum & mag hydroxide-simeth (MAALOX/MYLANTA) 200-200-20 MG/5ML suspension Take 30 mLs by mouth every 4 (four) hours as needed for indigestion or heartburn.   atorvastatin (LIPITOR) 80 MG tablet Take 1 tablet (80 mg total) by mouth daily at 6 PM.   bisacodyl (DULCOLAX) 10 MG suppository Place 10 mg rectally as needed for moderate constipation.   Chlorhexidine Gluconate 2 % SOLN Place and dissolve one application buccally at bedtime   citalopram (CELEXA) 10 MG tablet Take 10 mg by mouth daily.   clopidogrel (PLAVIX) 75 MG tablet Take 1 tablet (75 mg total) by mouth daily.   divalproex (DEPAKOTE SPRINKLE) 125 MG capsule Take 250 mg by mouth 2 (two) times daily.   guaiFENesin (TUSSIN PO) Take 10 mLs by mouth every 4 (four) hours as needed (cough).   hydrochlorothiazide (MICROZIDE) 12.5 MG capsule Take 12.5 mg by mouth daily.   hydrocortisone 2.5 % cream Apply 1 Application topically every 8 (eight) hours as needed (for hemorrhoids).   Infant Care Products [facility]) OINT Apply 1 application  topically every 12 (twelve) hours as needed (apply to axilla, groin, and under breast).   latanoprost (XALATAN) 0.005 % ophthalmic solution Place 1 drop into both eyes at bedtime.    levothyroxine (SYNTHROID) 88 MCG tablet Take 88 mcg by mouth daily. At bedtime   lisinopril (ZESTRIL) 20 MG tablet Take 20 mg by mouth daily.   magnesium hydroxide (MILK OF MAGNESIA) 400 MG/5ML suspension Take 30 mLs by mouth every 12 (twelve) hours as needed for mild constipation.   Menthol, Topical Analgesic, (BIOFREEZE) 4 % GEL Apply 1 Application topically every 8 (eight) hours as needed (For knee pain, apply to both knees).   NYAMYC powder Apply 1 Application topically every  12 (twelve) hours as needed (for skin breakdown, apply under breast).   polyethylene glycol (MIRALAX / GLYCOLAX) packet Take 17 g by mouth daily as needed.   sodium fluoride (SODIUM FLUORIDE 5000 PLUS) 1.1 % CREA dental cream Place 1 Application onto teeth at bedtime.   tamsulosin (FLOMAX) 0.4 MG CAPS capsule Take 1 capsule (0.4 mg total) by mouth daily.   traMADol (ULTRAM) 50 MG tablet TAKE 1 TABLET BY MOUTH TWICE DAILY;TAKE 1 TABLET BY MOUTH EVERY SIX HOURS AS NEEDED FOR INCREASED PAIN   No facility-administered encounter medications on file as of 02/08/2023.    Review of Systems  Unable to perform ROS: Dementia     Immunization History  Administered Date(s) Administered   Influenza Split 07/18/2014, 07/23/2015   Influenza, High Dose Seasonal PF 06/14/2017, 06/15/2022   Influenza-Unspecified 05/25/2012, 05/31/2016, 06/01/2018,  06/21/2021   Moderna Covid-19 Vaccine Bivalent Booster 36yrs & up 01/26/2022   Moderna SARS-COV2 Booster Vaccination 07/11/2020, 01/16/2021   Moderna Sars-Covid-2 Vaccination 09/19/2019, 10/17/2019   PFIZER Comirnaty(Gray Top)Covid-19 Tri-Sucrose Vaccine 07/09/2022   Pfizer Covid-19 Vaccine Bivalent Booster 61yrs & up 05/22/2021   Pneumococcal Polysaccharide-23 08/30/2004, 08/04/2017, 02/15/2019   Pertinent  Health Maintenance Due  Topic Date Due   INFLUENZA VACCINE  03/31/2023   DEXA SCAN  Discontinued      01/30/2019    2:00 PM 04/26/2022   10:00 AM 05/19/2022    9:12 AM 06/02/2022    2:12 PM 02/03/2023    3:02 PM  Fall Risk  Falls in the past year?   0  0  Was there an injury with Fall?   0    Fall Risk Category Calculator   0    Fall Risk Category (Retired)   Low    (RETIRED) Patient Fall Risk Level High fall risk High fall risk Moderate fall risk High fall risk   Patient at Risk for Falls Due to   History of fall(s)    Fall risk Follow up   Falls evaluation completed     Functional Status Survey:    Vitals:   02/08/23 1507  BP: 137/82  Pulse:  68  Resp: 12  Temp: 98 F (36.7 C)  SpO2: 90%  Weight: 155 lb 6.4 oz (70.5 kg)  Height: 5\' 4"  (1.626 m)   Body mass index is 26.67 kg/m. Physical Exam Constitutional:      General: She is not in acute distress.    Appearance: She is well-developed. She is not diaphoretic.  HENT:     Head: Normocephalic and atraumatic.     Mouth/Throat:     Pharynx: No oropharyngeal exudate.  Eyes:     Conjunctiva/sclera: Conjunctivae normal.     Pupils: Pupils are equal, round, and reactive to light.  Cardiovascular:     Rate and Rhythm: Normal rate and regular rhythm.     Heart sounds: Normal heart sounds.  Pulmonary:     Effort: Pulmonary effort is normal.     Breath sounds: Normal breath sounds.  Abdominal:     General: Bowel sounds are normal.     Palpations: Abdomen is soft.  Musculoskeletal:     Cervical back: Normal range of motion and neck supple.     Right lower leg: No edema.     Left lower leg: No edema.  Skin:    General: Skin is warm and dry.  Neurological:     Mental Status: She is alert.  Psychiatric:        Cognition and Memory: Cognition is impaired. Memory is impaired.     Labs reviewed: Recent Labs    04/26/22 1132 09/02/22 0000 11/29/22 0000 12/29/22 0000  NA 139 142 145 141  K 3.5 4.1 3.7 3.4*  CL 102 101 108 102  CO2 30 38* 34* 36*  GLUCOSE 141*  --   --   --   BUN 18 14 22* 18  CREATININE 0.58 0.7 0.6 0.6  CALCIUM 9.0 9.5 8.8 9.0   Recent Labs    04/26/22 1132 09/02/22 0000 11/29/22 0000 12/29/22 0000  AST 21 18 14 15   ALT 14 11 6* 9  ALKPHOS 66 52 43 39  BILITOT 1.0  --   --   --   PROT 6.6  --   --   --   ALBUMIN 3.7 3.6 3.1* 3.2*  Recent Labs    04/26/22 1132 07/13/22 1330 07/15/22 0000 09/02/22 0000 11/29/22 0000 12/29/22 0000  WBC 6.1 5.2 5.6 6.2 6.8 5.3  NEUTROABS 3.9 3.2 3,046.00  --  3,386.00 2,268.00  HGB 13.1 13.4 12.3 13.0 11.1* 11.8*  HCT 40.1 40.7 37 39 34* 36  MCV 94.8 94.9  --   --   --   --   PLT 104* 119*  108* 96* 98* 81*   Lab Results  Component Value Date   TSH 0.50 08/31/2022   Lab Results  Component Value Date   HGBA1C 5.6 01/27/2018   Lab Results  Component Value Date   CHOL 146 06/30/2022   HDL 42 06/30/2022   LDLCALC 79 06/30/2022   TRIG 155 06/30/2022   CHOLHDL 3.7 01/28/2018    Significant Diagnostic Results in last 30 days:  No results found.  Assessment/Plan 1. Enlarged and hypertrophic nails Overgrown nails trimmed.  2. Chronic diastolic congestive heart failure (HCC) -euvolemic  3. Thrombocytopenia (HCC) Stable, no signs of abnormal bleeding or bruising. Will monitor  4. Dementia, multiinfarct, without behavioral disturbance (HCC) -Blood pressure well controlled, goal bp <140/90 Continue current medications and dietary modifications follow metabolic panel  5. Primary hypertension -Blood pressure well controlled, goal bp <140/90 Continue current medications and dietary modifications follow metabolic panel  6. History of stroke Stable, continues on statin, plavix with proper BP control.   7. Hypothyroidism due to acquired atrophy of thyroid -TSH at goal on synthroid 88 mcg  8. Chronic retention of urine -stable on flomax.   9. Depression Appears stable, continues on citalopram 10 mg daily  [facility] [phone]

## 2023-02-09 DIAGNOSIS — F321 Major depressive disorder, single episode, moderate: Secondary | ICD-10-CM | POA: Insufficient documentation

## 2023-02-28 DIAGNOSIS — I10 Essential (primary) hypertension: Secondary | ICD-10-CM | POA: Diagnosis not present

## 2023-02-28 LAB — BASIC METABOLIC PANEL
BUN: 14 (ref 4–21)
CO2: 34 — AB (ref 13–22)
Chloride: 104 (ref 99–108)
Creatinine: 0.6 (ref 0.5–1.1)
Glucose: 65
Potassium: 3.4 mEq/L — AB (ref 3.5–5.1)
Sodium: 143 (ref 137–147)

## 2023-02-28 LAB — COMPREHENSIVE METABOLIC PANEL
Calcium: 9.1 (ref 8.7–10.7)
eGFR: 89

## 2023-03-08 ENCOUNTER — Other Ambulatory Visit: Payer: Self-pay | Admitting: Nurse Practitioner

## 2023-03-08 DIAGNOSIS — G8929 Other chronic pain: Secondary | ICD-10-CM

## 2023-03-08 MED ORDER — TRAMADOL HCL 50 MG PO TABS
ORAL_TABLET | ORAL | 0 refills | Status: DC
Start: 2023-03-08 — End: 2023-07-01

## 2023-03-22 ENCOUNTER — Encounter: Payer: Self-pay | Admitting: Nurse Practitioner

## 2023-03-22 NOTE — Telephone Encounter (Signed)
Skilled resident at [facility]  

## 2023-03-31 DIAGNOSIS — I10 Essential (primary) hypertension: Secondary | ICD-10-CM | POA: Diagnosis not present

## 2023-03-31 LAB — BASIC METABOLIC PANEL
BUN: 19 (ref 4–21)
CO2: 35 — AB (ref 13–22)
Chloride: 103 (ref 99–108)
Creatinine: 0.6 (ref 0.5–1.1)
Glucose: 71
Potassium: 3.8 mEq/L (ref 3.5–5.1)
Sodium: 141 (ref 137–147)

## 2023-03-31 LAB — COMPREHENSIVE METABOLIC PANEL
Calcium: 8.9 (ref 8.7–10.7)
eGFR: 88

## 2023-04-04 ENCOUNTER — Encounter: Payer: Self-pay | Admitting: Adult Health

## 2023-04-04 ENCOUNTER — Non-Acute Institutional Stay (SKILLED_NURSING_FACILITY): Payer: Medicare PPO | Admitting: Adult Health

## 2023-04-04 DIAGNOSIS — K5901 Slow transit constipation: Secondary | ICD-10-CM | POA: Diagnosis not present

## 2023-04-04 NOTE — Progress Notes (Unsigned)
Location:  Other Nursing Home Room Number: 407 [id number] Place of Service:  SNF (31) Provider:  Kenard Gower, DNP, FNP-BC  Patient Care Team: Earnestine Mealing, MD as PCP - General (Family Medicine) Mariah Milling, Tollie Pizza, MD as Consulting Physician (Cardiology) Sharee Holster, NP as Nurse Practitioner (Geriatric Medicine)  Extended Emergency Contact Information Primary Emergency Contact: Haeberle,[name] Address: [address]          [place], [place] [address] Darden Amber of [place] Phone: [phone] Relation: Daughter Secondary Emergency Contact: Freda Munro Address: [address]          [place], [place] [address] Darden Amber of [place] Home Phone: [phone] Mobile Phone: [phone] Relation: Spouse  Code Status:  DNR  Goals of care: Advanced Directive information    04/04/2023    4:28 PM  Advanced Directives  Does Patient Have a Medical Advance Directive? Yes  Type of Estate agent of [place];Living will;Out of facility DNR (pink MOST or yellow form)  Does patient want to make changes to medical advance directive? No - Patient declined  Copy of Healthcare Power of Attorney in Chart? Yes - validated most recent copy scanned in chart (See row information)  Pre-existing out of facility DNR order (yellow form or pink MOST form) Pink MOST form placed in chart (order not valid for inpatient use);Yellow form placed in chart (order not valid for inpatient use)     Chief Complaint  Patient presents with   Acute Visit    Constipation    HPI:  Pt is a 87 y.o. female seen today for medical management of chronic diseases.  ***   Past Medical History:  Diagnosis Date   Cerebral infarction (HCC)    CKD (chronic kidney disease), stage III (HCC)    Depression    DJD (degenerative joint disease)    Edema    High cholesterol    Hyperlipidemia, unspecified    Hypertension    Hypothyroidism    Lower GI bleed 01/31/2019    Last Assessment & Plan:  Formatting of this note might be different from the original. Noted several episodes here and at [facility]. Was on dual antiplatelets and still is but asa dose was lowered. She is doing well now and in good spirits today in her room in isolation given covid. No abd pain and eating well with normal bm's per staff.   Malaise    Morbid obesity (HCC)    Osteoarthritis    Parathyroid abnormality (HCC)    Seizures (HCC)    a. following remote stroke.   Stroke [facility]) 2002, 05/2017, 01/26/18, 01/30/18   a. 1964-->residual right arm wkns.  Previously on coumadin - pt says for just a few yrs.   Subdural hematoma (HCC)    a. 10/2001 [facility] req temporal frontoparietal craniotomy following fall. ? whether or not pt on coumadin @ time.  Notes indicate yes but pt denies.   Thyroid disease    TMJ (dislocation of temporomandibular joint)    Tuberculosis    a. ~ 1950   Urinary incontinence    Weight loss    Past Surgical History:  Procedure Laterality Date   ABDOMINAL HYSTERECTOMY     BACK SURGERY     BRAIN SURGERY     cataract surgery     2014   LEFT HEART CATH AND CORONARY ANGIOGRAPHY N/A 06/13/2017   Procedure: LEFT HEART CATH AND CORONARY ANGIOGRAPHY;  Surgeon: Iran Ouch, MD;  Location: [facility] INVASIVE CV LAB;  Service: Cardiovascular;  Laterality: N/A;   PARATHYROID ADENOMA REMOVAL     TEE WITHOUT CARDIOVERSION N/A 01/31/2018   Procedure: TRANSESOPHAGEAL ECHOCARDIOGRAM (TEE);  Surgeon: Yvonne Kendall, MD;  Location: [facility] ORS;  Service: Cardiovascular;  Laterality: N/A;   TEE WITHOUT CARDIOVERSION N/A 02/01/2018   Procedure: TRANSESOPHAGEAL ECHOCARDIOGRAM (TEE);  Surgeon: Antonieta Iba, MD;  Location: [facility] ORS;  Service: Cardiovascular;  Laterality: N/A;    No Known Allergies  Outpatient Encounter Medications as of 04/04/2023  Medication Sig   acetaminophen (TYLENOL) 325 MG tablet Take 650 mg by mouth every 4 (four) hours as needed. See other listing   acetaminophen  (TYLENOL) 325 MG tablet Take 650 mg by mouth 3 (three) times daily. Scheduled, see as needed listing   alum & mag hydroxide-simeth (MAALOX/MYLANTA) 200-200-20 MG/5ML suspension Take 30 mLs by mouth every 4 (four) hours as needed for indigestion or heartburn.   atorvastatin (LIPITOR) 80 MG tablet Take 1 tablet (80 mg total) by mouth daily at 6 PM.   bisacodyl (DULCOLAX) 10 MG suppository Place 10 mg rectally as needed for moderate constipation.   chlorhexidine (PERIDEX) 0.12 % solution Use as directed 15 mLs in the mouth or throat. Place and dissolve 1 application buccally at bedtime for oral care APPLY AFTER FLOURIDE CREAM   Chlorhexidine Gluconate 2 % SOLN Place and dissolve one application buccally at bedtime   citalopram (CELEXA) 10 MG tablet Take 10 mg by mouth daily.   clopidogrel (PLAVIX) 75 MG tablet Take 1 tablet (75 mg total) by mouth daily.   divalproex (DEPAKOTE SPRINKLE) 125 MG capsule Take 250 mg by mouth 2 (two) times daily.   guaiFENesin (TUSSIN PO) Take 10 mLs by mouth every 4 (four) hours as needed (cough).   hydrochlorothiazide (MICROZIDE) 12.5 MG capsule Take 12.5 mg by mouth daily.   hydrocortisone 2.5 % cream Apply 1 Application topically every 8 (eight) hours as needed (for hemorrhoids).   Infant Care Products [facility]) OINT Apply 1 application  topically every 12 (twelve) hours as needed (apply to axilla, groin, and under breast).   latanoprost (XALATAN) 0.005 % ophthalmic solution Place 1 drop into both eyes at bedtime.    levothyroxine (SYNTHROID) 88 MCG tablet Take 88 mcg by mouth daily. At bedtime   lisinopril (ZESTRIL) 20 MG tablet Take 20 mg by mouth daily.   magnesium hydroxide (MILK OF MAGNESIA) 400 MG/5ML suspension Take 30 mLs by mouth every 12 (twelve) hours as needed for mild constipation.   Menthol, Topical Analgesic, (BIOFREEZE) 4 % GEL Apply 1 Application topically every 8 (eight) hours as needed (For knee pain, apply to both knees).   NYAMYC powder Apply 1  Application topically every 12 (twelve) hours as needed (for skin breakdown, apply under breast).   polyethylene glycol (MIRALAX / GLYCOLAX) packet Take 17 g by mouth daily as needed.   potassium chloride SA (KLOR-CON M) 20 MEQ tablet Take 20 mEq by mouth daily.   sodium fluoride (SODIUM FLUORIDE 5000 PLUS) 1.1 % CREA dental cream Place 1 Application onto teeth at bedtime.   tamsulosin (FLOMAX) 0.4 MG CAPS capsule Take 1 capsule (0.4 mg total) by mouth daily.   traMADol (ULTRAM) 50 MG tablet TAKE 1 TABLET BY MOUTH TWICE DAILY;TAKE 1 TABLET BY MOUTH EVERY SIX HOURS AS NEEDED FOR INCREASED PAIN   No facility-administered encounter medications on file as of 04/04/2023.    Review of Systems ***    Immunization History  Administered Date(s) Administered   Influenza Split 07/18/2014, 07/23/2015  Influenza, High Dose Seasonal PF 06/14/2017, 06/15/2022   Influenza-Unspecified 05/25/2012, 05/31/2016, 06/01/2018, 06/21/2021   Moderna Covid-19 Vaccine Bivalent Booster 25yrs & up 01/26/2022   Moderna SARS-COV2 Booster Vaccination 07/11/2020, 01/16/2021   Moderna Sars-Covid-2 Vaccination 09/19/2019, 10/17/2019   PFIZER Comirnaty(Gray Top)Covid-19 Tri-Sucrose Vaccine 07/09/2022   Pfizer Covid-19 Vaccine Bivalent Booster 64yrs & up 05/22/2021   Pneumococcal Polysaccharide-23 08/30/2004, 08/04/2017, 02/15/2019   Zoster Recombinant(Shingrix) 03/08/2023   Pertinent  Health Maintenance Due  Topic Date Due   INFLUENZA VACCINE  03/31/2023   DEXA SCAN  Discontinued      01/30/2019    2:00 PM 04/26/2022   10:00 AM 05/19/2022    9:12 AM 06/02/2022    2:12 PM 02/03/2023    3:02 PM  Fall Risk  Falls in the past year?   0  0  Was there an injury with Fall?   0    Fall Risk Category Calculator   0    Fall Risk Category (Retired)   Low    (RETIRED) Patient Fall Risk Level High fall risk High fall risk Moderate fall risk High fall risk   Patient at Risk for Falls Due to   History of fall(s)    Fall risk  Follow up   Falls evaluation completed       Vitals:   04/04/23 1626  BP: 126/71  Pulse: 78  Resp: 17  Temp: (!) 97.2 F (36.2 C)  SpO2: 93%  Weight: 152 lb (68.9 kg)  Height: 5\' 4"  (1.626 m)   Body mass index is 26.09 kg/m.  Physical Exam     Labs reviewed: Recent Labs    04/26/22 1132 09/02/22 0000 12/29/22 0000 02/28/23 0000 03/31/23 0000  NA 139   < > 141 143 141  K 3.5   < > 3.4* 3.4* 3.8  CL 102   < > 102 104 103  CO2 30   < > 36* 34* 35*  GLUCOSE 141*  --   --   --   --   BUN 18   < > 18 14 19   CREATININE 0.58   < > 0.6 0.6 0.6  CALCIUM 9.0   < > 9.0 9.1 8.9   < > = values in this interval not displayed.   Recent Labs    04/26/22 1132 09/02/22 0000 11/29/22 0000 12/29/22 0000  AST 21 18 14 15   ALT 14 11 6* 9  ALKPHOS 66 52 43 39  BILITOT 1.0  --   --   --   PROT 6.6  --   --   --   ALBUMIN 3.7 3.6 3.1* 3.2*   Recent Labs    04/26/22 1132 07/13/22 1330 07/15/22 0000 09/02/22 0000 11/29/22 0000 12/29/22 0000  WBC 6.1 5.2 5.6 6.2 6.8 5.3  NEUTROABS 3.9 3.2 3,046.00  --  3,386.00 2,268.00  HGB 13.1 13.4 12.3 13.0 11.1* 11.8*  HCT 40.1 40.7 37 39 34* 36  MCV 94.8 94.9  --   --   --   --   PLT 104* 119* 108* 96* 98* 81*   Lab Results  Component Value Date   TSH 0.50 08/31/2022   Lab Results  Component Value Date   HGBA1C 5.6 01/27/2018   Lab Results  Component Value Date   CHOL 146 06/30/2022   HDL 42 06/30/2022   LDLCALC 79 06/30/2022   TRIG 155 06/30/2022   CHOLHDL 3.7 01/28/2018    Significant Diagnostic Results in last 30 days:  No  results found.  Assessment/Plan ***   Family/ staff Communication: Discussed plan of care with resident and charge nurse  Labs/tests ordered:     Kenard Gower, DNP, MSN, FNP-BC [facility] [phone] (Monday-Friday 8:00 a.m. - 5:00 p.m.) [phone] (after hours)

## 2023-04-18 ENCOUNTER — Encounter: Payer: Self-pay | Admitting: Student

## 2023-04-18 ENCOUNTER — Non-Acute Institutional Stay: Payer: Self-pay | Admitting: Student

## 2023-04-18 DIAGNOSIS — U071 COVID-19: Secondary | ICD-10-CM | POA: Diagnosis not present

## 2023-04-18 DIAGNOSIS — F0150 Vascular dementia without behavioral disturbance: Secondary | ICD-10-CM | POA: Diagnosis not present

## 2023-04-18 DIAGNOSIS — F321 Major depressive disorder, single episode, moderate: Secondary | ICD-10-CM

## 2023-04-18 DIAGNOSIS — I110 Hypertensive heart disease with heart failure: Secondary | ICD-10-CM

## 2023-04-18 DIAGNOSIS — D696 Thrombocytopenia, unspecified: Secondary | ICD-10-CM

## 2023-04-18 DIAGNOSIS — I5032 Chronic diastolic (congestive) heart failure: Secondary | ICD-10-CM | POA: Diagnosis not present

## 2023-04-18 DIAGNOSIS — N183 Chronic kidney disease, stage 3 unspecified: Secondary | ICD-10-CM | POA: Diagnosis not present

## 2023-04-18 NOTE — Progress Notes (Signed)
Location:  Other [place].  Nursing Home Room Number: [facility] [address]A Place of Service:  SNF [phone]) Provider:  Earnestine Mealing, MD  Patient Care Team: Earnestine Mealing, MD as PCP - General (Family Medicine) Mariah Milling, Tollie Pizza, MD as Consulting Physician (Cardiology) Sharee Holster, NP as Nurse Practitioner (Geriatric Medicine)  Extended Emergency Contact Information Primary Emergency Contact: Claar,[name] Address: [address]          [place], [place] [address] Darden Amber of [place] Phone: [phone] Relation: Daughter Secondary Emergency Contact: Freda Munro Address: [address]          [place], [place] [address] Darden Amber of [place] Home Phone: [phone] Mobile Phone: [phone] Relation: Spouse  Code Status:  DNR Goals of care: Advanced Directive information    04/18/2023   11:16 AM  Advanced Directives  Does Patient Have a Medical Advance Directive? Yes  Type of Estate agent of [place];Out of facility DNR (pink MOST or yellow form);Living will  Does patient want to make changes to medical advance directive? No - Patient declined  Copy of Healthcare Power of Attorney in Chart? Yes - validated most recent copy scanned in chart (See row information)     Chief Complaint  Patient presents with   Acute Visit    Positive Covid.     HPI:  Pt is a 87 y.o. female seen today for an acute visit for Positive Covid.  Patient states she has a bit of a headache. Denies chest pain, shortness of breath, sinus pain/congestion. Says she feels fine.   Per nursing, patient has had normal vitals and is eating well.  This is her mom's third time with COVID and she hasn't really had symptoms. She hasn't really been as effected as other people. She and her dad would rather leave everything alone.   Past Medical History:  Diagnosis Date   Cerebral infarction [facility])    CKD (chronic kidney disease), stage III (HCC)     Depression    DJD (degenerative joint disease)    Edema    High cholesterol    Hyperlipidemia, unspecified    Hypertension    Hypothyroidism    Lower GI bleed 01/31/2019   Last Assessment & Plan:  Formatting of this note might be different from the original. Noted several episodes here and at [facility]. Was on dual antiplatelets and still is but asa dose was lowered. She is doing well now and in good spirits today in her room in isolation given covid. No abd pain and eating well with normal bm's per staff.   Malaise    Morbid obesity (HCC)    Osteoarthritis    Parathyroid abnormality (HCC)    Seizures (HCC)    a. following remote stroke.   Stroke [facility]) 2002, 05/2017, 01/26/18, 01/30/18   a. 1964-->residual right arm wkns.  Previously on coumadin - pt says for just a few yrs.   Subdural hematoma (HCC)    a. 10/2001 [facility] req temporal frontoparietal craniotomy following fall. ? whether or not pt on coumadin @ time.  Notes indicate yes but pt denies.   Thyroid disease    TMJ (dislocation of temporomandibular joint)    Tuberculosis    a. ~ 1950   Urinary incontinence    Weight loss    Past Surgical History:  Procedure Laterality Date   ABDOMINAL HYSTERECTOMY     BACK SURGERY     BRAIN SURGERY     cataract surgery  2014   LEFT HEART CATH AND CORONARY ANGIOGRAPHY N/A 06/13/2017   Procedure: LEFT HEART CATH AND CORONARY ANGIOGRAPHY;  Surgeon: Iran Ouch, MD;  Location: [facility] INVASIVE CV LAB;  Service: Cardiovascular;  Laterality: N/A;   PARATHYROID ADENOMA REMOVAL     TEE WITHOUT CARDIOVERSION N/A 01/31/2018   Procedure: TRANSESOPHAGEAL ECHOCARDIOGRAM (TEE);  Surgeon: Yvonne Kendall, MD;  Location: [facility] ORS;  Service: Cardiovascular;  Laterality: N/A;   TEE WITHOUT CARDIOVERSION N/A 02/01/2018   Procedure: TRANSESOPHAGEAL ECHOCARDIOGRAM (TEE);  Surgeon: Antonieta Iba, MD;  Location: [facility] ORS;  Service: Cardiovascular;  Laterality: N/A;    No Known Allergies  Outpatient Encounter  Medications as of 04/18/2023  Medication Sig   acetaminophen (TYLENOL) 325 MG tablet Take 650 mg by mouth every 4 (four) hours as needed. See other listing   acetaminophen (TYLENOL) 325 MG tablet Take 650 mg by mouth 3 (three) times daily. Scheduled, see as needed listing   alum & mag hydroxide-simeth (MAALOX/MYLANTA) 200-200-20 MG/5ML suspension Take 30 mLs by mouth every 4 (four) hours as needed for indigestion or heartburn.   atorvastatin (LIPITOR) 80 MG tablet Take 1 tablet (80 mg total) by mouth daily at 6 PM.   bisacodyl (DULCOLAX) 10 MG suppository Place 10 mg rectally as needed for moderate constipation.   citalopram (CELEXA) 10 MG tablet Take 10 mg by mouth daily.   clopidogrel (PLAVIX) 75 MG tablet Take 1 tablet (75 mg total) by mouth daily.   divalproex (DEPAKOTE SPRINKLE) 125 MG capsule Take 250 mg by mouth 2 (two) times daily.   guaiFENesin (TUSSIN PO) Take 10 mLs by mouth every 4 (four) hours as needed (cough).   hydrochlorothiazide (MICROZIDE) 12.5 MG capsule Take 12.5 mg by mouth daily.   hydrocortisone 2.5 % cream Apply 1 Application topically every 8 (eight) hours as needed (for hemorrhoids).   Infant Care Products [facility]) OINT Apply 1 application  topically every 12 (twelve) hours as needed (apply to axilla, groin, and under breast).   latanoprost (XALATAN) 0.005 % ophthalmic solution Place 1 drop into both eyes at bedtime.    levothyroxine (SYNTHROID) 88 MCG tablet Take 88 mcg by mouth daily. At bedtime   lisinopril (ZESTRIL) 20 MG tablet Take 20 mg by mouth daily.   magnesium hydroxide (MILK OF MAGNESIA) 400 MG/5ML suspension Take 30 mLs by mouth every 12 (twelve) hours as needed for mild constipation.   Menthol, Topical Analgesic, (BIOFREEZE) 4 % GEL Apply 1 Application topically every 8 (eight) hours as needed (For knee pain, apply to both knees).   NYAMYC powder Apply 1 Application topically every 12 (twelve) hours as needed (for skin breakdown, apply under breast).    polyethylene glycol (MIRALAX / GLYCOLAX) packet Take 17 g by mouth daily as needed.   potassium chloride SA (KLOR-CON M) 20 MEQ tablet Take 20 mEq by mouth daily.   sodium fluoride (SODIUM FLUORIDE 5000 PLUS) 1.1 % CREA dental cream Place 1 Application onto teeth at bedtime.   tamsulosin (FLOMAX) 0.4 MG CAPS capsule Take 1 capsule (0.4 mg total) by mouth daily.   traMADol (ULTRAM) 50 MG tablet TAKE 1 TABLET BY MOUTH TWICE DAILY;TAKE 1 TABLET BY MOUTH EVERY SIX HOURS AS NEEDED FOR INCREASED PAIN   [DISCONTINUED] chlorhexidine (PERIDEX) 0.12 % solution Use as directed 15 mLs in the mouth or throat. Place and dissolve 1 application buccally at bedtime for oral care APPLY AFTER FLOURIDE CREAM   [DISCONTINUED] Chlorhexidine Gluconate 2 % SOLN Place and dissolve one application buccally at bedtime  No facility-administered encounter medications on file as of 04/18/2023.    Review of Systems  Immunization History  Administered Date(s) Administered   Influenza Split 07/18/2014, 07/23/2015   Influenza, High Dose Seasonal PF 06/14/2017, 06/15/2022   Influenza-Unspecified 05/25/2012, 05/31/2016, 06/01/2018, 06/21/2021   Moderna Covid-19 Vaccine Bivalent Booster 50yrs & up 01/26/2022, 07/09/2022   Moderna SARS-COV2 Booster Vaccination 07/11/2020, 01/16/2021   Moderna Sars-Covid-2 Vaccination 09/19/2019, 10/17/2019   PFIZER Comirnaty(Gray Top)Covid-19 Tri-Sucrose Vaccine 07/09/2022   Pfizer Covid-19 Vaccine Bivalent Booster 35yrs & up 05/22/2021   Pneumococcal Polysaccharide-23 08/30/2004, 08/04/2017, 02/15/2019   Zoster Recombinant(Shingrix) 02/08/2023, 03/08/2023   Pertinent  Health Maintenance Due  Topic Date Due   INFLUENZA VACCINE  03/31/2023   DEXA SCAN  Discontinued      01/30/2019    2:00 PM 04/26/2022   10:00 AM 05/19/2022    9:12 AM 06/02/2022    2:12 PM 02/03/2023    3:02 PM  Fall Risk  Falls in the past year?   0  0  Was there an injury with Fall?   0    Fall Risk Category Calculator    0    Fall Risk Category (Retired)   Low    (RETIRED) Patient Fall Risk Level High fall risk High fall risk Moderate fall risk High fall risk   Patient at Risk for Falls Due to   History of fall(s)    Fall risk Follow up   Falls evaluation completed     Functional Status Survey:    Vitals:   04/18/23 1106  BP: 108/67  Pulse: 80  Resp: 20  Temp: (!) 97.5 F (36.4 C)  SpO2: 91%  Weight: 151 lb (68.5 kg)  Height: 5\' 4"  (1.626 m)   Body mass index is 25.92 kg/m. Physical Exam Cardiovascular:     Rate and Rhythm: Normal rate and regular rhythm.     Pulses: Normal pulses.  Pulmonary:     Effort: Pulmonary effort is normal.  Abdominal:     Palpations: Abdomen is soft.  Skin:    General: Skin is warm and dry.  Neurological:     Mental Status: She is alert. Mental status is at baseline.     Labs reviewed: Recent Labs    04/26/22 1132 09/02/22 0000 12/29/22 0000 02/28/23 0000 03/31/23 0000  NA 139   < > 141 143 141  K 3.5   < > 3.4* 3.4* 3.8  CL 102   < > 102 104 103  CO2 30   < > 36* 34* 35*  GLUCOSE 141*  --   --   --   --   BUN 18   < > 18 14 19   CREATININE 0.58   < > 0.6 0.6 0.6  CALCIUM 9.0   < > 9.0 9.1 8.9   < > = values in this interval not displayed.   Recent Labs    04/26/22 1132 09/02/22 0000 11/29/22 0000 12/29/22 0000  AST 21 18 14 15   ALT 14 11 6* 9  ALKPHOS 66 52 43 39  BILITOT 1.0  --   --   --   PROT 6.6  --   --   --   ALBUMIN 3.7 3.6 3.1* 3.2*   Recent Labs    04/26/22 1132 07/13/22 1330 07/15/22 0000 09/02/22 0000 11/29/22 0000 12/29/22 0000  WBC 6.1 5.2 5.6 6.2 6.8 5.3  NEUTROABS 3.9 3.2 3,046.00  --  3,386.00 2,268.00  HGB 13.1 13.4 12.3 13.0 11.1* 11.8*  HCT 40.1 40.7 37 39 34* 36  MCV 94.8 94.9  --   --   --   --   PLT 104* 119* 108* 96* 98* 81*   Lab Results  Component Value Date   TSH 0.50 08/31/2022   Lab Results  Component Value Date   HGBA1C 5.6 01/27/2018   Lab Results  Component Value Date   CHOL 146  06/30/2022   HDL 42 06/30/2022   LDLCALC 79 06/30/2022   TRIG 155 06/30/2022   CHOLHDL 3.7 01/28/2018    Significant Diagnostic Results in last 30 days:  No results found.  Assessment/Plan COVID-19 virus infection  Hypertensive heart disease with chronic diastolic congestive heart failure (HCC)  Stage 3 chronic kidney disease, unspecified whether stage 3a or 3b CKD (HCC)  Depression, major, single episode, moderate (HCC)  Dementia, multiinfarct, without behavioral disturbance (HCC)  Thrombocytopenia (HCC)  Chronic diastolic congestive heart failure (HCC) Patient diagnosed with COVID this weekend with slight congestion and headache. Otherwise, no issues. Plan to defer treatment unless symptoms progress. BP well-controlled. Kidney disease stable. Mood stable. Chronic pain syndrome stable. Memory is stable without obvious acute decline, however, she has communication challenges and deficits. Thrombocytopenia stable. Euvolemic on exam.   Family/ staff Communication: nursing  Labs/tests ordered:  none

## 2023-04-19 NOTE — Telephone Encounter (Signed)
Message sent to PCP Earnestine Mealing, MD

## 2023-04-26 ENCOUNTER — Encounter: Payer: Self-pay | Admitting: Nurse Practitioner

## 2023-04-26 ENCOUNTER — Non-Acute Institutional Stay (SKILLED_NURSING_FACILITY): Payer: Medicare PPO | Admitting: Nurse Practitioner

## 2023-04-26 DIAGNOSIS — N183 Chronic kidney disease, stage 3 unspecified: Secondary | ICD-10-CM

## 2023-04-26 DIAGNOSIS — U071 COVID-19: Secondary | ICD-10-CM

## 2023-04-26 DIAGNOSIS — I110 Hypertensive heart disease with heart failure: Secondary | ICD-10-CM | POA: Diagnosis not present

## 2023-04-26 DIAGNOSIS — I5032 Chronic diastolic (congestive) heart failure: Secondary | ICD-10-CM

## 2023-04-26 DIAGNOSIS — Q845 Enlarged and hypertrophic nails: Secondary | ICD-10-CM

## 2023-04-26 DIAGNOSIS — E034 Atrophy of thyroid (acquired): Secondary | ICD-10-CM

## 2023-04-26 DIAGNOSIS — F0150 Vascular dementia without behavioral disturbance: Secondary | ICD-10-CM | POA: Diagnosis not present

## 2023-04-26 NOTE — Progress Notes (Signed)
Location:  Other [facility].  Nursing Home Room Number: DNR Place of Service:  SNF (31) Abbey Chatters, NP  PCP: Earnestine Mealing, MD  Patient Care Team: Earnestine Mealing, MD as PCP - General (Family Medicine) Mariah Milling, Tollie Pizza, MD as Consulting Physician (Cardiology) Sharee Holster, NP as Nurse Practitioner (Geriatric Medicine)  Extended Emergency Contact Information Primary Emergency Contact: Stick,[name] Address: [address]          [place], [place] [address] Darden Amber of [place] Phone: [phone] Relation: Daughter Secondary Emergency Contact: Freda Munro Address: [address]          [place], [place] [address] Darden Amber of [place] Home Phone: [phone] Mobile Phone: [phone] Relation: Spouse  Goals of care: Advanced Directive information    04/26/2023   11:28 AM  Advanced Directives  Does Patient Have a Medical Advance Directive? Yes  Type of Estate agent of [place];Out of facility DNR (pink MOST or yellow form);Living will  Does patient want to make changes to medical advance directive? No - Patient declined  Copy of Healthcare Power of Attorney in Chart? Yes - validated most recent copy scanned in chart (See row information)     Chief Complaint  Patient presents with   Medical Management of Chronic Issues    Medical Management of Chronic Issues.     HPI:  Pt is a 87 y.o. female seen today for medical management of chronic disease.  Pt tested positive for COVID earlier this month but did not have any symptoms.  Staff reports she is doing well without any concerns.  Pt is a poor historian, she denies shortness of breath, cough, congestion.  Reports ongoing pain in her bottom which has been chronic.  Slight weight loss noted over last few months.    Past Medical History:  Diagnosis Date   Cerebral infarction [facility])    CKD (chronic kidney disease), stage III (HCC)    Depression    DJD  (degenerative joint disease)    Edema    High cholesterol    Hyperlipidemia, unspecified    Hypertension    Hypothyroidism    Lower GI bleed 01/31/2019   Last Assessment & Plan:  Formatting of this note might be different from the original. Noted several episodes here and at [facility]. Was on dual antiplatelets and still is but asa dose was lowered. She is doing well now and in good spirits today in her room in isolation given covid. No abd pain and eating well with normal bm's per staff.   Malaise    Morbid obesity (HCC)    Osteoarthritis    Parathyroid abnormality (HCC)    Seizures (HCC)    a. following remote stroke.   Stroke [facility]) 2002, 05/2017, 01/26/18, 01/30/18   a. 1964-->residual right arm wkns.  Previously on coumadin - pt says for just a few yrs.   Subdural hematoma (HCC)    a. 10/2001 [facility] req temporal frontoparietal craniotomy following fall. ? whether or not pt on coumadin @ time.  Notes indicate yes but pt denies.   Thyroid disease    TMJ (dislocation of temporomandibular joint)    Tuberculosis    a. ~ 1950   Urinary incontinence    Weight loss    Past Surgical History:  Procedure Laterality Date   ABDOMINAL HYSTERECTOMY     BACK SURGERY     BRAIN SURGERY     cataract surgery     2014   LEFT HEART CATH  AND CORONARY ANGIOGRAPHY N/A 06/13/2017   Procedure: LEFT HEART CATH AND CORONARY ANGIOGRAPHY;  Surgeon: Iran Ouch, MD;  Location: [facility] INVASIVE CV LAB;  Service: Cardiovascular;  Laterality: N/A;   PARATHYROID ADENOMA REMOVAL     TEE WITHOUT CARDIOVERSION N/A 01/31/2018   Procedure: TRANSESOPHAGEAL ECHOCARDIOGRAM (TEE);  Surgeon: Yvonne Kendall, MD;  Location: [facility] ORS;  Service: Cardiovascular;  Laterality: N/A;   TEE WITHOUT CARDIOVERSION N/A 02/01/2018   Procedure: TRANSESOPHAGEAL ECHOCARDIOGRAM (TEE);  Surgeon: Antonieta Iba, MD;  Location: [facility] ORS;  Service: Cardiovascular;  Laterality: N/A;    No Known Allergies  Outpatient Encounter Medications as of  04/26/2023  Medication Sig   acetaminophen (TYLENOL) 325 MG tablet Take 650 mg by mouth every 4 (four) hours as needed. See other listing   acetaminophen (TYLENOL) 325 MG tablet Take 650 mg by mouth 3 (three) times daily. Scheduled, see as needed listing   alum & mag hydroxide-simeth (MAALOX/MYLANTA) 200-200-20 MG/5ML suspension Take 30 mLs by mouth every 4 (four) hours as needed for indigestion or heartburn.   atorvastatin (LIPITOR) 80 MG tablet Take 1 tablet (80 mg total) by mouth daily at 6 PM.   bisacodyl (DULCOLAX) 10 MG suppository Place 10 mg rectally as needed for moderate constipation.   citalopram (CELEXA) 10 MG tablet Take 10 mg by mouth daily.   clopidogrel (PLAVIX) 75 MG tablet Take 1 tablet (75 mg total) by mouth daily.   divalproex (DEPAKOTE SPRINKLE) 125 MG capsule Take 250 mg by mouth 2 (two) times daily.   guaiFENesin (TUSSIN PO) Take 10 mLs by mouth every 4 (four) hours as needed (cough).   hydrochlorothiazide (MICROZIDE) 12.5 MG capsule Take 12.5 mg by mouth daily.   hydrocortisone 2.5 % cream Apply 1 Application topically every 8 (eight) hours as needed (for hemorrhoids).   Infant Care Products [facility]) OINT Apply 1 application  topically every 12 (twelve) hours as needed (apply to axilla, groin, and under breast).   latanoprost (XALATAN) 0.005 % ophthalmic solution Place 1 drop into both eyes at bedtime.    levothyroxine (SYNTHROID) 88 MCG tablet Take 88 mcg by mouth daily. At bedtime   lisinopril (ZESTRIL) 20 MG tablet Take 20 mg by mouth daily.   magnesium hydroxide (MILK OF MAGNESIA) 400 MG/5ML suspension Take 30 mLs by mouth every 12 (twelve) hours as needed for mild constipation.   Menthol, Topical Analgesic, (BIOFREEZE) 4 % GEL Apply 1 Application topically every 8 (eight) hours as needed (For knee pain, apply to both knees).   NYAMYC powder Apply 1 Application topically every 12 (twelve) hours as needed (for skin breakdown, apply under breast).   polyethylene  glycol (MIRALAX / GLYCOLAX) packet Take 17 g by mouth daily as needed.   potassium chloride SA (KLOR-CON M) 20 MEQ tablet Take 20 mEq by mouth daily.   sodium fluoride (SODIUM FLUORIDE 5000 PLUS) 1.1 % CREA dental cream Place 1 Application onto teeth at bedtime.   tamsulosin (FLOMAX) 0.4 MG CAPS capsule Take 1 capsule (0.4 mg total) by mouth daily.   traMADol (ULTRAM) 50 MG tablet TAKE 1 TABLET BY MOUTH TWICE DAILY;TAKE 1 TABLET BY MOUTH EVERY SIX HOURS AS NEEDED FOR INCREASED PAIN   No facility-administered encounter medications on file as of 04/26/2023.    Review of Systems  Unable to perform ROS: Dementia     Immunization History  Administered Date(s) Administered   Influenza Split 07/18/2014, 07/23/2015   Influenza, High Dose Seasonal PF 06/14/2017, 06/15/2022   Influenza-Unspecified 05/25/2012, 05/31/2016, 06/01/2018, 06/21/2021  Moderna Covid-19 Vaccine Bivalent Booster 73yrs & up 01/26/2022, 07/09/2022   Moderna SARS-COV2 Booster Vaccination 07/11/2020, 01/16/2021   Moderna Sars-Covid-2 Vaccination 09/19/2019, 10/17/2019   PFIZER Comirnaty(Gray Top)Covid-19 Tri-Sucrose Vaccine 07/09/2022   PNEUMOCOCCAL CONJUGATE-20 02/07/2023   Pfizer Covid-19 Vaccine Bivalent Booster 62yrs & up 05/22/2021   Pneumococcal Polysaccharide-23 08/30/2004, 08/04/2017, 02/15/2019   Zoster Recombinant(Shingrix) 02/08/2023, 03/08/2023   Pertinent  Health Maintenance Due  Topic Date Due   INFLUENZA VACCINE  03/31/2023   DEXA SCAN  Discontinued      01/30/2019    2:00 PM 04/26/2022   10:00 AM 05/19/2022    9:12 AM 06/02/2022    2:12 PM 02/03/2023    3:02 PM  Fall Risk  Falls in the past year?   0  0  Was there an injury with Fall?   0    Fall Risk Category Calculator   0    Fall Risk Category (Retired)   Low    (RETIRED) Patient Fall Risk Level High fall risk High fall risk Moderate fall risk High fall risk   Patient at Risk for Falls Due to   History of fall(s)    Fall risk Follow up   Falls  evaluation completed     Functional Status Survey:    Vitals:   04/26/23 1113 04/26/23 1130  BP: (!) 148/69 127/71  Pulse: 70   Resp: 17   Temp: (!) 96.8 F (36 C)   SpO2: 95%   Weight: 151 lb (68.5 kg)   Height: 5\' 4"  (1.626 m)    Body mass index is 25.92 kg/m. Physical Exam Constitutional:      General: She is not in acute distress.    Appearance: She is well-developed. She is not diaphoretic.  HENT:     Head: Normocephalic and atraumatic.     Mouth/Throat:     Pharynx: No oropharyngeal exudate.  Eyes:     Conjunctiva/sclera: Conjunctivae normal.     Pupils: Pupils are equal, round, and reactive to light.  Cardiovascular:     Rate and Rhythm: Normal rate and regular rhythm.     Heart sounds: Normal heart sounds.  Pulmonary:     Effort: Pulmonary effort is normal.     Breath sounds: Normal breath sounds.  Abdominal:     General: Bowel sounds are normal.     Palpations: Abdomen is soft.  Musculoskeletal:     Cervical back: Normal range of motion and neck supple.     Right lower leg: No edema.     Left lower leg: No edema.  Skin:    General: Skin is warm and dry.  Neurological:     Mental Status: She is alert.  Psychiatric:        Mood and Affect: Mood normal.     Labs reviewed: Recent Labs    04/26/22 1132 09/02/22 0000 12/29/22 0000 02/28/23 0000 03/31/23 0000  NA 139   < > 141 143 141  K 3.5   < > 3.4* 3.4* 3.8  CL 102   < > 102 104 103  CO2 30   < > 36* 34* 35*  GLUCOSE 141*  --   --   --   --   BUN 18   < > 18 14 19   CREATININE 0.58   < > 0.6 0.6 0.6  CALCIUM 9.0   < > 9.0 9.1 8.9   < > = values in this interval not displayed.   Recent Labs  04/26/22 1132 09/02/22 0000 11/29/22 0000 12/29/22 0000  AST 21 18 14 15   ALT 14 11 6* 9  ALKPHOS 66 52 43 39  BILITOT 1.0  --   --   --   PROT 6.6  --   --   --   ALBUMIN 3.7 3.6 3.1* 3.2*   Recent Labs    04/26/22 1132 07/13/22 1330 07/13/22 1330 07/15/22 0000 09/02/22 0000  11/29/22 0000 12/29/22 0000  WBC 6.1 5.2   < > 5.6 6.2 6.8 5.3  NEUTROABS 3.9 3.2  --  3,046.00  --  3,386.00 2,268.00  HGB 13.1 13.4  --  12.3 13.0 11.1* 11.8*  HCT 40.1 40.7  --  37 39 34* 36  MCV 94.8 94.9  --   --   --   --   --   PLT 104* 119*  --  108* 96* 98* 81*   < > = values in this interval not displayed.   Lab Results  Component Value Date   TSH 0.50 08/31/2022   Lab Results  Component Value Date   HGBA1C 5.6 01/27/2018   Lab Results  Component Value Date   CHOL 146 06/30/2022   HDL 42 06/30/2022   LDLCALC 79 06/30/2022   TRIG 155 06/30/2022   CHOLHDL 3.7 01/28/2018    Significant Diagnostic Results in last 30 days:  No results found.  Assessment/Plan 1. Stage 3 chronic kidney disease, unspecified whether stage 3a or 3b CKD (HCC) -Chronic and stable Encourage proper hydration Follow metabolic panel Avoid nephrotoxic meds (NSAIDS)  2. Dementia, multiinfarct, without behavioral disturbance (HCC) -Stable, no acute changes in cognitive or functional status, continue supportive care.  No increase or changes in behaviors noted.   3. Hypertensive heart disease with chronic diastolic congestive heart failure (HCC) -Blood pressure well controlled, goal bp <140/90 Euvolemic  Continue current medications and dietary modifications follow metabolic panel  4. COVID-19 virus infection Has done well with no significant symptoms   5. Hypothyroid Tsh at goal   6. Enlarged and hypertrophic nails Nails trimmed     Casen Pryor K. Biagio Borg [facility] [phone]

## 2023-05-30 ENCOUNTER — Encounter: Payer: Self-pay | Admitting: Nurse Practitioner

## 2023-06-06 ENCOUNTER — Non-Acute Institutional Stay (SKILLED_NURSING_FACILITY): Payer: Self-pay | Admitting: Student

## 2023-06-06 ENCOUNTER — Encounter: Payer: Self-pay | Admitting: Student

## 2023-06-06 DIAGNOSIS — N183 Chronic kidney disease, stage 3 unspecified: Secondary | ICD-10-CM | POA: Diagnosis not present

## 2023-06-06 DIAGNOSIS — G8929 Other chronic pain: Secondary | ICD-10-CM

## 2023-06-06 DIAGNOSIS — D649 Anemia, unspecified: Secondary | ICD-10-CM | POA: Diagnosis not present

## 2023-06-06 DIAGNOSIS — I10 Essential (primary) hypertension: Secondary | ICD-10-CM

## 2023-06-06 DIAGNOSIS — F321 Major depressive disorder, single episode, moderate: Secondary | ICD-10-CM | POA: Diagnosis not present

## 2023-06-06 DIAGNOSIS — I110 Hypertensive heart disease with heart failure: Secondary | ICD-10-CM | POA: Diagnosis not present

## 2023-06-06 DIAGNOSIS — D696 Thrombocytopenia, unspecified: Secondary | ICD-10-CM

## 2023-06-06 DIAGNOSIS — F01B18 Vascular dementia, moderate, with other behavioral disturbance: Secondary | ICD-10-CM

## 2023-06-06 DIAGNOSIS — E034 Atrophy of thyroid (acquired): Secondary | ICD-10-CM | POA: Diagnosis not present

## 2023-06-06 DIAGNOSIS — E782 Mixed hyperlipidemia: Secondary | ICD-10-CM

## 2023-06-06 DIAGNOSIS — R339 Retention of urine, unspecified: Secondary | ICD-10-CM

## 2023-06-06 DIAGNOSIS — Z8673 Personal history of transient ischemic attack (TIA), and cerebral infarction without residual deficits: Secondary | ICD-10-CM

## 2023-06-06 DIAGNOSIS — I5032 Chronic diastolic (congestive) heart failure: Secondary | ICD-10-CM

## 2023-06-06 NOTE — Progress Notes (Signed)
Location:  Other [place].  Nursing Home Room Number: [facility] [id number] Place of Service:  SNF [phone]) Provider:  Earnestine Mealing, MD  Patient Care Team: Earnestine Mealing, MD as PCP - General (Family Medicine) Mariah Milling, Tollie Pizza, MD as Consulting Physician (Cardiology) Sharee Holster, NP as Nurse Practitioner (Geriatric Medicine)  Extended Emergency Contact Information Primary Emergency Contact: Berroa,[name] Address: [address]          [place], [place] [address] D[place] of [place] Phone: [phone] Relation: Daughter Secondary Emergency Contact: Freda Munro Address: [address]          [place], [place] [address] D[place] of [place] Home Phone: [phone] Mobile Phone: [phone] Relation: Spouse  Code Status:  DNR Goals of care: Advanced Directive information    06/06/2023    2:29 PM  Advanced Directives  Does Patient Have a Medical Advance Directive? Yes  Type of Estate agent of [place];Out of facility DNR (pink MOST or yellow form);Living will  Does patient want to make changes to medical advance directive? No - Patient declined  Copy of Healthcare Power of Attorney in Chart? Yes - validated most recent copy scanned in chart (See row information)     Chief Complaint  Patient presents with   Medical Management of Chronic Issues    Medical Management of Chronic Issues.     HPI:  Pt is a 87 y.o. female seen today for medical management of chronic diseases.    Patient states she is doing well.  She has no concerns at this time.  She states she is eating well, urinating well without any burning, and having normal bowel movements.  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  She is retired from IT consultant of young adults.  She speaks to her husband often but he did not complete her this weekend she i is in good spirits.  Nursing without acute concerns at this time   Past Medical History:   Diagnosis Date   Cerebral infarction [facility])    CKD (chronic kidney disease), stage III (HCC)    Depression    DJD (degenerative joint disease)    Edema    High cholesterol    Hyperlipidemia, unspecified    Hypertension    Hypothyroidism    Lower GI bleed 01/31/2019   Last Assessment & Plan:  Formatting of this note might be different from the original. Noted several episodes here and at [facility]. Was on dual antiplatelets and still is but asa dose was lowered. She is doing well now and in good spirits today in her room in isolation given covid. No abd pain and eating well with normal bm's per staff.   Malaise    Morbid obesity (HCC)    Osteoarthritis    Parathyroid abnormality (HCC)    Seizures (HCC)    a. following remote stroke.   Stroke [facility]) 2002, 05/2017, 01/26/18, 01/30/18   a. 1964-->residual right arm wkns.  Previously on coumadin - pt says for just a few yrs.   Subdural hematoma (HCC)    a. 10/2001 [facility] req temporal frontoparietal craniotomy following fall. ? whether or not pt on coumadin @ time.  Notes indicate yes but pt denies.   Thyroid disease    TMJ (dislocation of temporomandibular joint)    Tuberculosis    a. ~ 1950   Urinary incontinence    Weight loss    Past Surgical History:  Procedure Laterality Date   ABDOMINAL HYSTERECTOMY  BACK SURGERY     BRAIN SURGERY     cataract surgery     2014   LEFT HEART CATH AND CORONARY ANGIOGRAPHY N/A 06/13/2017   Procedure: LEFT HEART CATH AND CORONARY ANGIOGRAPHY;  Surgeon: Iran Ouch, MD;  Location: [facility] INVASIVE CV LAB;  Service: Cardiovascular;  Laterality: N/A;   PARATHYROID ADENOMA REMOVAL     TEE WITHOUT CARDIOVERSION N/A 01/31/2018   Procedure: TRANSESOPHAGEAL ECHOCARDIOGRAM (TEE);  Surgeon: Yvonne Kendall, MD;  Location: [facility] ORS;  Service: Cardiovascular;  Laterality: N/A;   TEE WITHOUT CARDIOVERSION N/A 02/01/2018   Procedure: TRANSESOPHAGEAL ECHOCARDIOGRAM (TEE);  Surgeon: Antonieta Iba, MD;  Location: [facility]  ORS;  Service: Cardiovascular;  Laterality: N/A;    No Known Allergies  Outpatient Encounter Medications as of 06/06/2023  Medication Sig   acetaminophen (TYLENOL) 325 MG tablet Take 650 mg by mouth every 4 (four) hours as needed. See other listing   acetaminophen (TYLENOL) 325 MG tablet Take 650 mg by mouth 3 (three) times daily. Scheduled, see as needed listing   alum & mag hydroxide-simeth (MAALOX/MYLANTA) 200-200-20 MG/5ML suspension Take 30 mLs by mouth every 4 (four) hours as needed for indigestion or heartburn.   atorvastatin (LIPITOR) 80 MG tablet Take 1 tablet (80 mg total) by mouth daily at 6 PM.   bisacodyl (DULCOLAX) 10 MG suppository Place 10 mg rectally as needed for moderate constipation.   citalopram (CELEXA) 10 MG tablet Take 10 mg by mouth daily.   clopidogrel (PLAVIX) 75 MG tablet Take 1 tablet (75 mg total) by mouth daily.   divalproex (DEPAKOTE SPRINKLE) 125 MG capsule Take 250 mg by mouth 2 (two) times daily.   guaiFENesin (TUSSIN PO) Take 10 mLs by mouth every 4 (four) hours as needed (cough).   hydrochlorothiazide (MICROZIDE) 12.5 MG capsule Take 12.5 mg by mouth daily.   hydrocortisone 2.5 % cream Apply 1 Application topically every 8 (eight) hours as needed (for hemorrhoids).   Infant Care Products [facility]) OINT Apply 1 application  topically every 12 (twelve) hours as needed (apply to axilla, groin, and under breast).   latanoprost (XALATAN) 0.005 % ophthalmic solution Place 1 drop into both eyes at bedtime.    levothyroxine (SYNTHROID) 88 MCG tablet Take 88 mcg by mouth daily. At bedtime   lisinopril (ZESTRIL) 20 MG tablet Take 20 mg by mouth daily.   magnesium hydroxide (MILK OF MAGNESIA) 400 MG/5ML suspension Take 30 mLs by mouth every 12 (twelve) hours as needed for mild constipation.   Menthol, Topical Analgesic, (BIOFREEZE) 4 % GEL Apply 1 Application topically every 8 (eight) hours as needed (For knee pain, apply to both knees).   NYAMYC powder Apply 1  Application topically every 12 (twelve) hours as needed (for skin breakdown, apply under breast).   polyethylene glycol (MIRALAX / GLYCOLAX) packet Take 17 g by mouth daily as needed.   potassium chloride SA (KLOR-CON M) 20 MEQ tablet Take 20 mEq by mouth daily.   sodium fluoride (SODIUM FLUORIDE 5000 PLUS) 1.1 % CREA dental cream Place 1 Application onto teeth at bedtime.   tamsulosin (FLOMAX) 0.4 MG CAPS capsule Take 1 capsule (0.4 mg total) by mouth daily.   traMADol (ULTRAM) 50 MG tablet TAKE 1 TABLET BY MOUTH TWICE DAILY;TAKE 1 TABLET BY MOUTH EVERY SIX HOURS AS NEEDED FOR INCREASED PAIN   No facility-administered encounter medications on file as of 06/06/2023.    Review of Systems  Immunization History  Administered Date(s) Administered   Influenza Split 07/18/2014, 07/23/2015  Influenza, High Dose Seasonal PF 06/14/2017, 06/15/2022   Influenza-Unspecified 05/25/2012, 05/31/2016, 06/01/2018, 06/21/2021   Moderna Covid-19 Vaccine Bivalent Booster 88yrs & up 01/26/2022, 07/09/2022   Moderna SARS-COV2 Booster Vaccination 07/11/2020, 01/16/2021   Moderna Sars-Covid-2 Vaccination 09/19/2019, 10/17/2019   PFIZER Comirnaty(Gray Top)Covid-19 Tri-Sucrose Vaccine 07/09/2022   PNEUMOCOCCAL CONJUGATE-20 02/07/2023   Pfizer Covid-19 Vaccine Bivalent Booster 87yrs & up 05/22/2021   Pneumococcal Polysaccharide-23 08/30/2004, 08/04/2017, 02/15/2019   Zoster Recombinant(Shingrix) 02/08/2023, 03/08/2023   Pertinent  Health Maintenance Due  Topic Date Due   INFLUENZA VACCINE  03/31/2023   DEXA SCAN  Discontinued      01/30/2019    2:00 PM 04/26/2022   10:00 AM 05/19/2022    9:12 AM 06/02/2022    2:12 PM 02/03/2023    3:02 PM  Fall Risk  Falls in the past year?   0  0  Was there an injury with Fall?   0    Fall Risk Category Calculator   0    Fall Risk Category (Retired)   Low    (RETIRED) Patient Fall Risk Level High fall risk High fall risk Moderate fall risk High fall risk   Patient at Risk  for Falls Due to   History of fall(s)    Fall risk Follow up   Falls evaluation completed     Functional Status Survey:    Vitals:   06/06/23 1422  BP: 138/68  Pulse: 71  Resp: 16  Temp: (!) 97.3 F (36.3 C)  SpO2: 95%  Weight: 143 lb 9.6 oz (65.1 kg)  Height: 5\' 4"  (1.626 m)   Body mass index is 24.65 kg/m. Physical Exam Cardiovascular:     Rate and Rhythm: Normal rate.     Pulses: Normal pulses.  Pulmonary:     Effort: Pulmonary effort is normal.  Neurological:     Mental Status: She is alert. Mental status is at baseline.     Comments: Oriented to self and place [place] but not to time.     Labs reviewed: Recent Labs    12/29/22 0000 02/28/23 0000 03/31/23 0000  NA 141 143 141  K 3.4* 3.4* 3.8  CL 102 104 103  CO2 36* 34* 35*  BUN 18 14 19   CREATININE 0.6 0.6 0.6  CALCIUM 9.0 9.1 8.9   Recent Labs    09/02/22 0000 11/29/22 0000 12/29/22 0000  AST 18 14 15   ALT 11 6* 9  ALKPHOS 52 43 39  ALBUMIN 3.6 3.1* 3.2*   Recent Labs    07/13/22 1330 07/13/22 1330 07/15/22 0000 09/02/22 0000 11/29/22 0000 12/29/22 0000  WBC 5.2   < > 5.6 6.2 6.8 5.3  NEUTROABS 3.2  --  3,046.00  --  3,386.00 2,268.00  HGB 13.4  --  12.3 13.0 11.1* 11.8*  HCT 40.7  --  37 39 34* 36  MCV 94.9  --   --   --   --   --   PLT 119*  --  108* 96* 98* 81*   < > = values in this interval not displayed.   Lab Results  Component Value Date   TSH 0.50 08/31/2022   Lab Results  Component Value Date   HGBA1C 5.6 01/27/2018   Lab Results  Component Value Date   CHOL 146 06/30/2022   HDL 42 06/30/2022   LDLCALC 79 06/30/2022   TRIG 155 06/30/2022   CHOLHDL 3.7 01/28/2018    Significant Diagnostic Results in last 30 days:  No  results found.  Assessment/Plan Moderate vascular dementia with other behavioral disturbance (HCC)  Stage 3 chronic kidney disease, unspecified whether stage 3a or 3b CKD (HCC)  Hypertensive heart disease with chronic diastolic congestive  heart failure (HCC)  Depression, major, single episode, moderate (HCC)  Thrombocytopenia (HCC)  Other chronic pain  Mixed hyperlipidemia  Hypothyroidism due to acquired atrophy of thyroid  Primary hypertension  History of stroke  Chronic retention of urine Patient without any acute changes at this time.  Of note had some weight loss during COVID infection, however has remained stable for the last couple weeks.  Patient is Michiel Sites lift dependent and incontinent of urine and stool.  She is nonambulatory.  Mood is stable at this time with Celexa 10 mg Depakote 125 mg.  Blood pressure well-controlled at this time with hydrochlorothiazide and Zestril.  History of urinary retention continue Flomax 0.4 mg.  Chronic pain well-controlled with tramadol 50 mg every 6 hours as needed and twice daily scheduled.  Thyroid previously well-controlled on 88 mcg recheck with routine labs in November. Family/ staff Communication: nursing  Labs/tests ordered:  routine labs in november

## 2023-06-16 ENCOUNTER — Telehealth: Payer: Medicare PPO | Admitting: Oncology

## 2023-07-01 ENCOUNTER — Other Ambulatory Visit: Payer: Self-pay | Admitting: Student

## 2023-07-01 DIAGNOSIS — G8929 Other chronic pain: Secondary | ICD-10-CM

## 2023-07-01 MED ORDER — TRAMADOL HCL 50 MG PO TABS
ORAL_TABLET | ORAL | 0 refills | Status: DC
Start: 2023-07-01 — End: 2023-10-28

## 2023-07-01 NOTE — Progress Notes (Signed)
Refill of chronic pain medication.

## 2023-07-04 DIAGNOSIS — E785 Hyperlipidemia, unspecified: Secondary | ICD-10-CM | POA: Diagnosis not present

## 2023-07-04 LAB — LIPID PANEL
Cholesterol: 130 (ref 0–200)
HDL: 47 (ref 35–70)
LDL Cholesterol: 69
Triglycerides: 68 (ref 40–160)

## 2023-07-07 ENCOUNTER — Encounter: Payer: Self-pay | Admitting: Oncology

## 2023-07-11 DIAGNOSIS — D649 Anemia, unspecified: Secondary | ICD-10-CM | POA: Diagnosis not present

## 2023-07-12 ENCOUNTER — Encounter: Payer: Self-pay | Admitting: Oncology

## 2023-07-12 ENCOUNTER — Encounter: Payer: Self-pay | Admitting: Student

## 2023-07-12 ENCOUNTER — Inpatient Hospital Stay: Payer: Medicare PPO | Attending: Oncology | Admitting: Oncology

## 2023-07-12 DIAGNOSIS — D696 Thrombocytopenia, unspecified: Secondary | ICD-10-CM | POA: Diagnosis not present

## 2023-07-12 DIAGNOSIS — E039 Hypothyroidism, unspecified: Secondary | ICD-10-CM | POA: Insufficient documentation

## 2023-07-12 NOTE — Progress Notes (Signed)
HEMATOLOGY-ONCOLOGY TeleHEALTH VISIT PROGRESS NOTE  I connected with Shawna [name] on 07/12/23  at  2:45 PM EST by video enabled telemedicine visit and verified that I am speaking with the correct person using two identifiers. I discussed the limitations, risks, security and privacy concerns of performing an evaluation and management service by telemedicine and the availability of in-person appointments. The patient expressed understanding and agreed to proceed.   Other persons participating in the visit and their role in the encounter:  Daughter [name]  Patient's location: Nursing home facility. Provider's location: office Chief Complaint: Follow-up for thrombocytopenia.  ASSESSMENT & PLAN:   Thrombocytopenia (HCC) Previous work up showed  Adequate vitamin B12, folate, negative peripheral blood flow cytometry.  Negative M protein on SPEP, slightly nonspecific increased light chain ratio.  24-hour urine protein electrophoresis negative M protein. Increased immature platelet count. Labs are reviewed and discussed with patient and daughter CBC showed a platelet count of [address]8469, decreased  Discussed with patient and daughter that thrombocytopenia is stable and is likely secondary to reactive process, drug side effects- Depakote, ITP, can not exclude bone marrow disorders.   I recommend observation and repeat blood work in 6 months.  All questions were answered. The patient knows to call the clinic with any problems, questions or concerns. Patient's daughter request virtual visit follow-ups due to patient's mobility issue..  She understands limitation of virtual visits.  Follow-up plan, 6 months, lab and virtual follow-up. Shawna Patience, MD, PhD [facility] Hematology Oncology 07/12/2023   INTERVAL HISTORY Shawna [name] is a 87 y.o. female who has above history reviewed by me today presents virtually for follow up visit for management of follow-up for thrombocytopenia Patient had a blood  work done at nursing home. Daughter reports no bleeding events.  Review of Systems  Constitutional:  Positive for fatigue. Negative for appetite change, chills and fever.  Cardiovascular:  Negative for chest pain.  Gastrointestinal:  Negative for abdominal pain.  Endocrine: Negative for hot flashes.  Musculoskeletal:  Negative for arthralgias.       Right hemiaplasia   Skin:  Negative for itching and rash.  Neurological:  Positive for extremity weakness.  Hematological:  Negative for adenopathy.    Past Medical History:  Diagnosis Date   Cerebral infarction [facility])    CKD (chronic kidney disease), stage III (HCC)    Depression    DJD (degenerative joint disease)    Edema    High cholesterol    Hyperlipidemia, unspecified    Hypertension    Hypothyroidism    Lower GI bleed 01/31/2019   Last Assessment & Plan:  Formatting of this note might be different from the original. Noted several episodes here and at [facility]. Was on dual antiplatelets and still is but asa dose was lowered. She is doing well now and in good spirits today in her room in isolation given covid. No abd pain and eating well with normal bm's per staff.   Malaise    Morbid obesity (HCC)    Osteoarthritis    Parathyroid abnormality (HCC)    Seizures (HCC)    a. following remote stroke.   Stroke [facility]) 2002, 05/2017, 01/26/18, 01/30/18   a. 1964-->residual right arm wkns.  Previously on coumadin - pt says for just a few yrs.   Subdural hematoma (HCC)    a. 10/2001 [facility] req temporal frontoparietal craniotomy following fall. ? whether or not pt on coumadin @ time.  Notes indicate yes but pt denies.   Thyroid  disease    TMJ (dislocation of temporomandibular joint)    Tuberculosis    a. ~ 1950   Urinary incontinence    Weight loss    Past Surgical History:  Procedure Laterality Date   ABDOMINAL HYSTERECTOMY     BACK SURGERY     BRAIN SURGERY     cataract surgery     2014   LEFT HEART CATH AND CORONARY ANGIOGRAPHY N/A  06/13/2017   Procedure: LEFT HEART CATH AND CORONARY ANGIOGRAPHY;  Surgeon: Iran Ouch, MD;  Location: [facility] INVASIVE CV LAB;  Service: Cardiovascular;  Laterality: N/A;   PARATHYROID ADENOMA REMOVAL     TEE WITHOUT CARDIOVERSION N/A 01/31/2018   Procedure: TRANSESOPHAGEAL ECHOCARDIOGRAM (TEE);  Surgeon: Yvonne Kendall, MD;  Location: [facility] ORS;  Service: Cardiovascular;  Laterality: N/A;   TEE WITHOUT CARDIOVERSION N/A 02/01/2018   Procedure: TRANSESOPHAGEAL ECHOCARDIOGRAM (TEE);  Surgeon: Antonieta Iba, MD;  Location: [facility] ORS;  Service: Cardiovascular;  Laterality: N/A;    Family History  Problem Relation Age of Onset   Heart failure Mother        died @ 37   Heart attack Father        died @ 59   Stroke Father    Hypertension Sister    Diabetes Sister    Hypertension Brother    Diabetes Brother    Breast cancer Neg Hx     Social History   Socioeconomic History   Marital status: Married    Spouse name: Shawna [name]   Number of children: 2   Years of education: BS   Highest education level: Not on file  Occupational History   Not on file  Tobacco Use   Smoking status: Never   Smokeless tobacco: Never  Vaping Use   Vaping status: Never Used  Substance and Sexual Activity   Alcohol use: No   Drug use: No   Sexual activity: Not on file  Other Topics Concern   Not on file  Social History Narrative   [place]  Active around the house.  Does not routinely exercise or drive.  Does own grocery shopping.  10/18/2018 residing at [facility]	[place]   Social Determinants of Health   Financial Resource Strain: Low Risk  (01/29/2019)   Overall Financial Resource Strain (CARDIA)    Difficulty of Paying Living Expenses: Not hard at all  Food Insecurity: Unknown (01/29/2019)   Hunger Vital Sign    Worried About Running Out of Food in the Last Year: Patient declined    Ran Out of Food in the Last Year: Patient declined  Transportation Needs:  Unknown (01/29/2019)   PRAPARE - Administrator, Civil Service (Medical): Patient declined    Lack of Transportation (Non-Medical): Patient declined  Physical Activity: Unknown (01/29/2019)   Exercise Vital Sign    Days of Exercise per Week: Patient declined    Minutes of Exercise per Session: Patient declined  Stress: Not on file  Social Connections: Unknown (01/29/2019)   Social Connection and Isolation Panel [NHANES]    Frequency of Communication with Friends and Family: Patient declined    Frequency of Social Gatherings with Friends and Family: Patient declined    Attends Religious Services: Patient declined    Active Member of Clubs or Organizations: Patient declined    Attends Banker Meetings: Patient declined    Marital Status: Patient declined  Intimate Partner Violence: Not At Risk (01/29/2019)  Humiliation, Afraid, Rape, and Kick questionnaire    Fear of Current or Ex-Partner: No    Emotionally Abused: No    Physically Abused: No    Sexually Abused: No    Current Outpatient Medications on File Prior to Visit  Medication Sig Dispense Refill   acetaminophen (TYLENOL) 325 MG tablet Take 650 mg by mouth every 4 (four) hours as needed. See other listing     acetaminophen (TYLENOL) 325 MG tablet Take 650 mg by mouth 3 (three) times daily. Scheduled, see as needed listing     alum & mag hydroxide-simeth (MAALOX/MYLANTA) 200-200-20 MG/5ML suspension Take 30 mLs by mouth every 4 (four) hours as needed for indigestion or heartburn.     atorvastatin (LIPITOR) 80 MG tablet Take 1 tablet (80 mg total) by mouth daily at 6 PM. 30 tablet 2   bisacodyl (DULCOLAX) 10 MG suppository Place 10 mg rectally as needed for moderate constipation.     citalopram (CELEXA) 10 MG tablet Take 10 mg by mouth daily.     clopidogrel (PLAVIX) 75 MG tablet Take 1 tablet (75 mg total) by mouth daily. 30 tablet 2   divalproex (DEPAKOTE SPRINKLE) 125 MG capsule Take 250 mg by mouth 2 (two)  times daily.     guaiFENesin (TUSSIN PO) Take 10 mLs by mouth every 4 (four) hours as needed (cough).     hydrochlorothiazide (MICROZIDE) 12.5 MG capsule Take 12.5 mg by mouth daily.     hydrocortisone 2.5 % cream Apply 1 Application topically every 8 (eight) hours as needed (for hemorrhoids).     Infant Care Products [facility]) OINT Apply 1 application  topically every 12 (twelve) hours as needed (apply to axilla, groin, and under breast).     latanoprost (XALATAN) 0.005 % ophthalmic solution Place 1 drop into both eyes at bedtime.   3   levothyroxine (SYNTHROID) 88 MCG tablet Take 88 mcg by mouth daily. At bedtime     lisinopril (ZESTRIL) 20 MG tablet Take 20 mg by mouth daily.     magnesium hydroxide (MILK OF MAGNESIA) 400 MG/5ML suspension Take 30 mLs by mouth every 12 (twelve) hours as needed for mild constipation.     Menthol, Topical Analgesic, (BIOFREEZE) 4 % GEL Apply 1 Application topically every 8 (eight) hours as needed (For knee pain, apply to both knees).     NYAMYC powder Apply 1 Application topically every 12 (twelve) hours as needed (for skin breakdown, apply under breast).     polyethylene glycol (MIRALAX / GLYCOLAX) packet Take 17 g by mouth daily as needed.     potassium chloride SA (KLOR-CON M) 20 MEQ tablet Take 20 mEq by mouth daily.     sodium fluoride (SODIUM FLUORIDE 5000 PLUS) 1.1 % CREA dental cream Place 1 Application onto teeth at bedtime.     tamsulosin (FLOMAX) 0.4 MG CAPS capsule Take 1 capsule (0.4 mg total) by mouth daily. 30 capsule 1   traMADol (ULTRAM) 50 MG tablet TAKE 1 TABLET BY MOUTH TWICE DAILY;TAKE 1 TABLET BY MOUTH EVERY SIX HOURS AS NEEDED FOR INCREASED PAIN 240 tablet 0   No current facility-administered medications on file prior to visit.    No Known Allergies     Observations/Objective: There were no vitals filed for this visit.  There is no height or weight on file to calculate BMI.  Physical Exam Constitutional:      Comments: Patient  lies in her bed.  No acute distress.      Labs  Latest Ref Rng & Units 12/29/2022   12:00 AM 11/29/2022   12:00 AM 09/02/2022   12:00 AM  CBC  WBC  5.3     6.8     6.2      Hemoglobin 12.0 - 16.0 11.8     11.1     13.0      Hematocrit 36 - 46 36     34     39      Platelets 150 - 400 K/uL 81     98     96         This result is from an external source.      Latest Ref Rng & Units 03/31/2023   12:00 AM 02/28/2023   12:00 AM 12/29/2022   12:00 AM  CMP  BUN 4 - 21 19     14     18       Creatinine 0.5 - 1.1 0.6     0.6     0.6      Sodium 137 - 147 141     143     141      Potassium 3.5 - 5.1 mEq/L 3.8     3.4     3.4      Chloride 99 - 108 103     104     102      CO2 13 - 22 35     34     36      Calcium 8.7 - 10.7 8.9     9.1     9.0      Alkaline Phos 25 - 125   39      AST 13 - 35   15      ALT 7 - 35 U/L   9         This result is from an external source.

## 2023-07-12 NOTE — Assessment & Plan Note (Signed)
Previous work up showed  Adequate vitamin B12, folate, negative peripheral blood flow cytometry.  Negative M protein on SPEP, slightly nonspecific increased light chain ratio.  24-hour urine protein electrophoresis negative M protein. Increased immature platelet count. Labs are reviewed and discussed with patient and daughter CBC showed a platelet count of [address], decreased  Discussed with patient and daughter that thrombocytopenia is stable and is likely secondary to reactive process, drug side effects- Depakote, ITP, can not exclude bone marrow disorders.   I recommend observation and repeat blood work in 6 months.

## 2023-07-14 ENCOUNTER — Encounter: Payer: Self-pay | Admitting: Student

## 2023-07-18 ENCOUNTER — Non-Acute Institutional Stay (SKILLED_NURSING_FACILITY): Payer: Medicare PPO | Admitting: Student

## 2023-07-18 ENCOUNTER — Encounter: Payer: Self-pay | Admitting: Student

## 2023-07-18 DIAGNOSIS — I110 Hypertensive heart disease with heart failure: Secondary | ICD-10-CM | POA: Diagnosis not present

## 2023-07-18 DIAGNOSIS — G8929 Other chronic pain: Secondary | ICD-10-CM | POA: Diagnosis not present

## 2023-07-18 DIAGNOSIS — F01B18 Vascular dementia, moderate, with other behavioral disturbance: Secondary | ICD-10-CM | POA: Diagnosis not present

## 2023-07-18 DIAGNOSIS — N183 Chronic kidney disease, stage 3 unspecified: Secondary | ICD-10-CM | POA: Diagnosis not present

## 2023-07-18 DIAGNOSIS — F321 Major depressive disorder, single episode, moderate: Secondary | ICD-10-CM | POA: Diagnosis not present

## 2023-07-18 DIAGNOSIS — I5032 Chronic diastolic (congestive) heart failure: Secondary | ICD-10-CM

## 2023-07-18 DIAGNOSIS — E034 Atrophy of thyroid (acquired): Secondary | ICD-10-CM

## 2023-07-18 DIAGNOSIS — Q845 Enlarged and hypertrophic nails: Secondary | ICD-10-CM | POA: Diagnosis not present

## 2023-07-18 NOTE — Progress Notes (Signed)
Location:  Other Nursing Home Room Number: [facility] [address]A Place of Service:  SNF [phone]) Provider:  Ander Gaster, Benetta Spar, MD  Patient Care Team: Earnestine Mealing, MD as PCP - General (Family Medicine) Mariah Milling, Tollie Pizza, MD as Consulting Physician (Cardiology) Sharee Holster, NP as Nurse Practitioner (Geriatric Medicine)  Extended Emergency Contact Information Primary Emergency Contact: Kopke,[name] Address: [address]          [place], [place] [address] D[place] of [place] Phone: [phone] Relation: Daughter Secondary Emergency Contact: Freda Munro Address: [address]          [place], [place] [address] Darden Amber of [place] Home Phone: [phone] Mobile Phone: [phone] Relation: Spouse  Code Status:  DNR Goals of care: Advanced Directive information    07/12/2023    2:24 PM  Advanced Directives  Does Patient Have a Medical Advance Directive? No  Type of Estate agent of [place];Living will     Chief Complaint  Patient presents with   Medical Management of Chronic conditions    HPI:  Pt is a 87 y.o. female seen today for medical management of chronic diseases.    Patient needs toenails trimmed today.    Past Medical History:  Diagnosis Date   Cerebral infarction [facility])    CKD (chronic kidney disease), stage III (HCC)    Depression    DJD (degenerative joint disease)    Edema    High cholesterol    Hyperlipidemia, unspecified    Hypertension    Hypothyroidism    Lower GI bleed 01/31/2019   Last Assessment & Plan:  Formatting of this note might be different from the original. Noted several episodes here and at [facility]. Was on dual antiplatelets and still is but asa dose was lowered. She is doing well now and in good spirits today in her room in isolation given covid. No abd pain and eating well with normal bm's per staff.   Malaise    Morbid obesity (HCC)    Osteoarthritis    Parathyroid  abnormality (HCC)    Seizures (HCC)    a. following remote stroke.   Stroke [facility]) 2002, 05/2017, 01/26/18, 01/30/18   a. 1964-->residual right arm wkns.  Previously on coumadin - pt says for just a few yrs.   Subdural hematoma (HCC)    a. 10/2001 [facility] req temporal frontoparietal craniotomy following fall. ? whether or not pt on coumadin @ time.  Notes indicate yes but pt denies.   Thyroid disease    TMJ (dislocation of temporomandibular joint)    Tuberculosis    a. ~ 1950   Urinary incontinence    Weight loss    Past Surgical History:  Procedure Laterality Date   ABDOMINAL HYSTERECTOMY     BACK SURGERY     BRAIN SURGERY     cataract surgery     2014   LEFT HEART CATH AND CORONARY ANGIOGRAPHY N/A 06/13/2017   Procedure: LEFT HEART CATH AND CORONARY ANGIOGRAPHY;  Surgeon: Iran Ouch, MD;  Location: [facility] INVASIVE CV LAB;  Service: Cardiovascular;  Laterality: N/A;   PARATHYROID ADENOMA REMOVAL     TEE WITHOUT CARDIOVERSION N/A 01/31/2018   Procedure: TRANSESOPHAGEAL ECHOCARDIOGRAM (TEE);  Surgeon: Yvonne Kendall, MD;  Location: [facility] ORS;  Service: Cardiovascular;  Laterality: N/A;   TEE WITHOUT CARDIOVERSION N/A 02/01/2018   Procedure: TRANSESOPHAGEAL ECHOCARDIOGRAM (TEE);  Surgeon: Antonieta Iba, MD;  Location: [facility] ORS;  Service: Cardiovascular;  Laterality: N/A;    No  Known Allergies  Outpatient Encounter Medications as of 07/18/2023  Medication Sig   acetaminophen (TYLENOL) 325 MG tablet Take 650 mg by mouth every 4 (four) hours as needed. See other listing   acetaminophen (TYLENOL) 325 MG tablet Take 650 mg by mouth 3 (three) times daily. Scheduled, see as needed listing   alum & mag hydroxide-simeth (MAALOX/MYLANTA) 200-200-20 MG/5ML suspension Take 30 mLs by mouth every 4 (four) hours as needed for indigestion or heartburn.   atorvastatin (LIPITOR) 80 MG tablet Take 1 tablet (80 mg total) by mouth daily at 6 PM.   bisacodyl (DULCOLAX) 10 MG suppository Place 10 mg  rectally as needed for moderate constipation.   citalopram (CELEXA) 10 MG tablet Take 10 mg by mouth daily.   clopidogrel (PLAVIX) 75 MG tablet Take 1 tablet (75 mg total) by mouth daily.   divalproex (DEPAKOTE SPRINKLE) 125 MG capsule Take 250 mg by mouth 2 (two) times daily.   guaiFENesin (TUSSIN PO) Take 10 mLs by mouth every 4 (four) hours as needed (cough).   hydrochlorothiazide (MICROZIDE) 12.5 MG capsule Take 12.5 mg by mouth daily.   hydrocortisone 2.5 % cream Apply 1 Application topically every 8 (eight) hours as needed (for hemorrhoids).   Infant Care Products [facility]) OINT Apply 1 application  topically every 12 (twelve) hours as needed (apply to axilla, groin, and under breast).   latanoprost (XALATAN) 0.005 % ophthalmic solution Place 1 drop into both eyes at bedtime.    levothyroxine (SYNTHROID) 88 MCG tablet Take 88 mcg by mouth daily. At bedtime   lisinopril (ZESTRIL) 20 MG tablet Take 20 mg by mouth daily.   magnesium hydroxide (MILK OF MAGNESIA) 400 MG/5ML suspension Take 30 mLs by mouth every 12 (twelve) hours as needed for mild constipation.   Menthol, Topical Analgesic, (BIOFREEZE) 4 % GEL Apply 1 Application topically every 8 (eight) hours as needed (For knee pain, apply to both knees).   NYAMYC powder Apply 1 Application topically every 12 (twelve) hours as needed (for skin breakdown, apply under breast).   polyethylene glycol (MIRALAX / GLYCOLAX) packet Take 17 g by mouth daily as needed.   potassium chloride SA (KLOR-CON M) 20 MEQ tablet Take 20 mEq by mouth daily.   sodium fluoride (SODIUM FLUORIDE 5000 PLUS) 1.1 % CREA dental cream Place 1 Application onto teeth at bedtime.   tamsulosin (FLOMAX) 0.4 MG CAPS capsule Take 1 capsule (0.4 mg total) by mouth daily.   traMADol (ULTRAM) 50 MG tablet TAKE 1 TABLET BY MOUTH TWICE DAILY;TAKE 1 TABLET BY MOUTH EVERY SIX HOURS AS NEEDED FOR INCREASED PAIN   No facility-administered encounter medications on file as of  07/18/2023.    Review of Systems  Immunization History  Administered Date(s) Administered   Influenza Split 07/18/2014, 07/23/2015   Influenza, High Dose Seasonal PF 06/14/2017, 06/15/2022   Influenza-Unspecified 05/25/2012, 05/31/2016, 06/01/2018, 06/21/2021   Moderna Covid-19 Vaccine Bivalent Booster 3yrs & up 01/26/2022, 07/09/2022   Moderna SARS-COV2 Booster Vaccination 07/11/2020, 01/16/2021   Moderna Sars-Covid-2 Vaccination 09/19/2019, 10/17/2019   PFIZER Comirnaty(Gray Top)Covid-19 Tri-Sucrose Vaccine 07/09/2022   PNEUMOCOCCAL CONJUGATE-20 02/07/2023   Pfizer Covid-19 Vaccine Bivalent Booster 64yrs & up 05/22/2021   Pneumococcal Polysaccharide-23 08/30/2004, 08/04/2017, 02/15/2019   Zoster Recombinant(Shingrix) 02/08/2023, 03/08/2023   Pertinent  Health Maintenance Due  Topic Date Due   INFLUENZA VACCINE  03/31/2023   DEXA SCAN  Discontinued      01/30/2019    2:00 PM 04/26/2022   10:00 AM 05/19/2022    9:12 AM  06/02/2022    2:12 PM 02/03/2023    3:02 PM  Fall Risk  Falls in the past year?   0  0  Was there an injury with Fall?   0    Fall Risk Category Calculator   0    Fall Risk Category (Retired)   Low    (RETIRED) Patient Fall Risk Level High fall risk High fall risk Moderate fall risk High fall risk   Patient at Risk for Falls Due to   History of fall(s)    Fall risk Follow up   Falls evaluation completed     Functional Status Survey:    Vitals:   07/18/23 0913  BP: (!) 149/77  Pulse: 87  Resp: 18  Temp: (!) 97.1 F (36.2 C)   There is no height or weight on file to calculate BMI. Physical Exam  Labs reviewed: Recent Labs    12/29/22 0000 02/28/23 0000 03/31/23 0000  NA 141 143 141  K 3.4* 3.4* 3.8  CL 102 104 103  CO2 36* 34* 35*  BUN 18 14 19   CREATININE 0.6 0.6 0.6  CALCIUM 9.0 9.1 8.9   Recent Labs    09/02/22 0000 11/29/22 0000 12/29/22 0000  AST 18 14 15   ALT 11 6* 9  ALKPHOS 52 43 39  ALBUMIN 3.6 3.1* 3.2*   Recent Labs     09/02/22 0000 11/29/22 0000 12/29/22 0000  WBC 6.2 6.8 5.3  NEUTROABS  --  3,386.00 2,268.00  HGB 13.0 11.1* 11.8*  HCT 39 34* 36  PLT 96* 98* 81*   Lab Results  Component Value Date   TSH 0.50 08/31/2022   Lab Results  Component Value Date   HGBA1C 5.6 01/27/2018   Lab Results  Component Value Date   CHOL 146 06/30/2022   HDL 42 06/30/2022   LDLCALC 79 06/30/2022   TRIG 155 06/30/2022   CHOLHDL 3.7 01/28/2018    Significant Diagnostic Results in last 30 days:  No results found.  Assessment/Plan Enlarged and hypertrophic nails [Q84.5]  Moderate vascular dementia with other behavioral disturbance (HCC)  Stage 3 chronic kidney disease, unspecified whether stage 3a or 3b CKD (HCC)  Hypertensive heart disease with chronic diastolic congestive heart failure (HCC)  Depression, major, single episode, moderate (HCC)  Hypothyroidism due to acquired atrophy of thyroid  Other chronic pain Patient seen today for evaluation of chronic diseases.  Chronic pain moderately controlled with tramadol, continue goals of care conversation with family regarding pain management.  History of dementia patient is alert however disoriented at this time.  She is dependent in all ADLs including feeding.  She is primarily in a wheelchair or Geri chair throughout the day.  She is incontinent of urine and bowel.  BP slightly elevated today however typically well-controlled continue weekly BP checks at this time.  Mood moderately controlled at this time.  Unclear if patient's uncontrollable crying is due to previous history of stroke.  Continue statin therapy 80 mg daily.  Continue Celexa 10 mg daily for mood.  Continue Plavix.  History of hypothyroid continue levothyroxine 88 mcg daily.  History of low platelets monitored by hematology and oncology, continue quarterly checks.  Some concern there could be low platelets due to Depakote, however patient's mood is only moderately controlled at this time.   Continue goals of care conversations with family regarding manage plan  -Consent given for treatment as described below: -Examined patient. -Continue supportive shoe gear daily. -Mycotic toenail 2 bilaterally were  debrided in length and girth with sterile nail nippers and dremel without incident. -Patient/POA to call should there be question/concern in the interim.    Family/ staff Communication: nursing  Labs/tests ordered:  none

## 2023-08-16 DIAGNOSIS — F03C18 Unspecified dementia, severe, with other behavioral disturbance: Secondary | ICD-10-CM | POA: Diagnosis not present

## 2023-08-16 DIAGNOSIS — F03C4 Unspecified dementia, severe, with anxiety: Secondary | ICD-10-CM | POA: Diagnosis not present

## 2023-08-16 DIAGNOSIS — F03C3 Unspecified dementia, severe, with mood disturbance: Secondary | ICD-10-CM | POA: Diagnosis not present

## 2023-09-21 DIAGNOSIS — R079 Chest pain, unspecified: Secondary | ICD-10-CM | POA: Diagnosis not present

## 2023-09-22 ENCOUNTER — Non-Acute Institutional Stay (SKILLED_NURSING_FACILITY): Payer: Medicare PPO | Admitting: Nurse Practitioner

## 2023-09-22 ENCOUNTER — Encounter: Payer: Self-pay | Admitting: Nurse Practitioner

## 2023-09-22 ENCOUNTER — Encounter: Payer: Self-pay | Admitting: Student

## 2023-09-22 DIAGNOSIS — F01B18 Vascular dementia, moderate, with other behavioral disturbance: Secondary | ICD-10-CM

## 2023-09-22 DIAGNOSIS — F321 Major depressive disorder, single episode, moderate: Secondary | ICD-10-CM

## 2023-09-22 DIAGNOSIS — I5032 Chronic diastolic (congestive) heart failure: Secondary | ICD-10-CM

## 2023-09-22 DIAGNOSIS — N183 Chronic kidney disease, stage 3 unspecified: Secondary | ICD-10-CM | POA: Diagnosis not present

## 2023-09-22 DIAGNOSIS — E034 Atrophy of thyroid (acquired): Secondary | ICD-10-CM

## 2023-09-22 DIAGNOSIS — I110 Hypertensive heart disease with heart failure: Secondary | ICD-10-CM | POA: Diagnosis not present

## 2023-09-22 DIAGNOSIS — D696 Thrombocytopenia, unspecified: Secondary | ICD-10-CM | POA: Diagnosis not present

## 2023-09-22 DIAGNOSIS — G8929 Other chronic pain: Secondary | ICD-10-CM | POA: Diagnosis not present

## 2023-09-22 NOTE — Progress Notes (Signed)
Location:  Other [place].  Nursing Home Room Number: [facility] [address]A Place of Service:  SNF [phone]) Abbey Chatters, NP  PCP: Earnestine Mealing, MD  Patient Care Team: Earnestine Mealing, MD as PCP - General (Family Medicine) Mariah Milling, Tollie Pizza, MD as Consulting Physician (Cardiology) Chilton Si Chong Sicilian, NP as Nurse Practitioner (Geriatric Medicine)  Extended Emergency Contact Information Primary Emergency Contact: Hartung,[name] Address: [address]          [place], [place] [address] Darden Amber of [place] Phone: [phone] Relation: Daughter Secondary Emergency Contact: Freda Munro Address: [address]          [place], [place] [address] Darden Amber of [place] Home Phone: [phone] Mobile Phone: [phone] Relation: Spouse  Goals of care: Advanced Directive information    09/22/2023    8:20 AM  Advanced Directives  Does Patient Have a Medical Advance Directive? Yes  Type of Estate agent of [place];Out of facility DNR (pink MOST or yellow form);Living will  Does patient want to make changes to medical advance directive? No - Patient declined  Copy of Healthcare Power of Attorney in Chart? Yes - validated most recent copy scanned in chart (See row information)     Chief Complaint  Patient presents with   Medical Management of Chronic Issues    Medical Management of Chronic Issues.     HPI:  Pt is a 88 y.o. female seen today for medical management of chronic disease. Pt with hx of chronic pain, CKD, CVA, hyperlipidemia, hypothyroid, and others.  Nursing has no acute concerns at this time Pts dementia and CVA limits her ROS- she reports she is doing well. States she is having pain in her bottom which is ongoing  No changes in behaviors noted.    Past Medical History:  Diagnosis Date   Cerebral infarction [facility])    CKD (chronic kidney disease), stage III (HCC)    Depression    DJD (degenerative joint disease)     Edema    High cholesterol    Hyperlipidemia, unspecified    Hypertension    Hypothyroidism    Lower GI bleed 01/31/2019   Last Assessment & Plan:  Formatting of this note might be different from the original. Noted several episodes here and at [facility]. Was on dual antiplatelets and still is but asa dose was lowered. She is doing well now and in good spirits today in her room in isolation given covid. No abd pain and eating well with normal bm's per staff.   Malaise    Morbid obesity (HCC)    Osteoarthritis    Parathyroid abnormality (HCC)    Seizures (HCC)    a. following remote stroke.   Stroke [facility]) 2002, 05/2017, 01/26/18, 01/30/18   a. 1964-->residual right arm wkns.  Previously on coumadin - pt says for just a few yrs.   Subdural hematoma (HCC)    a. 10/2001 [facility] req temporal frontoparietal craniotomy following fall. ? whether or not pt on coumadin @ time.  Notes indicate yes but pt denies.   Thyroid disease    TMJ (dislocation of temporomandibular joint)    Tuberculosis    a. ~ 1950   Urinary incontinence    Weight loss    Past Surgical History:  Procedure Laterality Date   ABDOMINAL HYSTERECTOMY     BACK SURGERY     BRAIN SURGERY     cataract surgery     2014   LEFT HEART CATH AND CORONARY ANGIOGRAPHY N/A  06/13/2017   Procedure: LEFT HEART CATH AND CORONARY ANGIOGRAPHY;  Surgeon: Iran Ouch, MD;  Location: [facility] INVASIVE CV LAB;  Service: Cardiovascular;  Laterality: N/A;   PARATHYROID ADENOMA REMOVAL     TEE WITHOUT CARDIOVERSION N/A 01/31/2018   Procedure: TRANSESOPHAGEAL ECHOCARDIOGRAM (TEE);  Surgeon: Yvonne Kendall, MD;  Location: [facility] ORS;  Service: Cardiovascular;  Laterality: N/A;   TEE WITHOUT CARDIOVERSION N/A 02/01/2018   Procedure: TRANSESOPHAGEAL ECHOCARDIOGRAM (TEE);  Surgeon: Antonieta Iba, MD;  Location: [facility] ORS;  Service: Cardiovascular;  Laterality: N/A;    No Known Allergies  Outpatient Encounter Medications as of 09/22/2023  Medication Sig    acetaminophen (TYLENOL) 325 MG tablet Take 650 mg by mouth 3 (three) times daily. Scheduled, see as needed listing   atorvastatin (LIPITOR) 80 MG tablet Take 1 tablet (80 mg total) by mouth daily at 6 PM.   Citalopram Hydrobromide 30 MG CAPS Take 30 mg by mouth daily.   clopidogrel (PLAVIX) 75 MG tablet Take 1 tablet (75 mg total) by mouth daily.   divalproex (DEPAKOTE SPRINKLE) 125 MG capsule Take 250 mg by mouth 2 (two) times daily.   hydrochlorothiazide (MICROZIDE) 12.5 MG capsule Take 12.5 mg by mouth daily.   Infant Care Products [facility]) OINT Apply 1 application  topically every 12 (twelve) hours as needed (apply to axilla, groin, and under breast).   latanoprost (XALATAN) 0.005 % ophthalmic solution Place 1 drop into both eyes at bedtime.    levothyroxine (SYNTHROID) 88 MCG tablet Take 88 mcg by mouth daily. At bedtime   lisinopril (ZESTRIL) 20 MG tablet Take 20 mg by mouth daily.   NYAMYC powder Apply 1 Application topically every 12 (twelve) hours as needed (for skin breakdown, apply under breast).   polyethylene glycol (MIRALAX / GLYCOLAX) packet Take 17 g by mouth daily as needed.   potassium chloride SA (KLOR-CON M) 20 MEQ tablet Take 20 mEq by mouth daily.   sodium fluoride (SODIUM FLUORIDE 5000 PLUS) 1.1 % CREA dental cream Place 1 Application onto teeth at bedtime.   tamsulosin (FLOMAX) 0.4 MG CAPS capsule Take 1 capsule (0.4 mg total) by mouth daily.   traMADol (ULTRAM) 50 MG tablet TAKE 1 TABLET BY MOUTH TWICE DAILY;TAKE 1 TABLET BY MOUTH EVERY SIX HOURS AS NEEDED FOR INCREASED PAIN   acetaminophen (TYLENOL) 325 MG tablet Take 650 mg by mouth every 4 (four) hours as needed. See other listing (Patient not taking: Reported on 09/22/2023)   alum & mag hydroxide-simeth (MAALOX/MYLANTA) 200-200-20 MG/5ML suspension Take 30 mLs by mouth every 4 (four) hours as needed for indigestion or heartburn. (Patient not taking: Reported on 09/22/2023)   bisacodyl (DULCOLAX) 10 MG suppository  Place 10 mg rectally as needed for moderate constipation. (Patient not taking: Reported on 09/22/2023)   magnesium hydroxide (MILK OF MAGNESIA) 400 MG/5ML suspension Take 30 mLs by mouth every 12 (twelve) hours as needed for mild constipation. (Patient not taking: Reported on 09/22/2023)   Menthol, Topical Analgesic, (BIOFREEZE) 4 % GEL Apply 1 Application topically every 8 (eight) hours as needed (For knee pain, apply to both knees). (Patient not taking: Reported on 09/22/2023)   [DISCONTINUED] guaiFENesin (TUSSIN PO) Take 10 mLs by mouth every 4 (four) hours as needed (cough).   [DISCONTINUED] hydrocortisone 2.5 % cream Apply 1 Application topically every 8 (eight) hours as needed (for hemorrhoids).   No facility-administered encounter medications on file as of 09/22/2023.    Review of Systems  Unable to perform ROS: Dementia  Immunization History  Administered Date(s) Administered   Influenza Split 07/18/2014, 07/23/2015   Influenza, High Dose Seasonal PF 06/14/2017, 06/15/2022   Influenza-Unspecified 05/25/2012, 05/31/2016, 06/01/2018, 06/21/2021, 06/22/2023   Moderna Covid-19 Vaccine Bivalent Booster 29yrs & up 01/26/2022, 07/09/2022   Moderna SARS-COV2 Booster Vaccination 07/11/2020, 01/16/2021   Moderna Sars-Covid-2 Vaccination 09/19/2019, 10/17/2019, 08/29/2023   PFIZER Comirnaty(Gray Top)Covid-19 Tri-Sucrose Vaccine 07/09/2022   PNEUMOCOCCAL CONJUGATE-20 02/07/2023   Pfizer Covid-19 Vaccine Bivalent Booster 52yrs & up 05/22/2021   Pneumococcal Polysaccharide-23 08/30/2004, 08/04/2017, 02/15/2019   Zoster Recombinant(Shingrix) 02/08/2023, 03/08/2023   Pertinent  Health Maintenance Due  Topic Date Due   INFLUENZA VACCINE  Completed   DEXA SCAN  Discontinued      01/30/2019    2:00 PM 04/26/2022   10:00 AM 05/19/2022    9:12 AM 06/02/2022    2:12 PM 02/03/2023    3:02 PM  Fall Risk  Falls in the past year?   0  0  Was there an injury with Fall?   0    Fall Risk Category  Calculator   0    Fall Risk Category (Retired)   Low    (RETIRED) Patient Fall Risk Level High fall risk High fall risk Moderate fall risk High fall risk   Patient at Risk for Falls Due to   History of fall(s)    Fall risk Follow up   Falls evaluation completed     Functional Status Survey:    Vitals:   09/22/23 0809 09/22/23 0822  BP: (!) 148/81 132/74  Pulse: 61   Resp: 17   Temp: 97.9 F (36.6 C)   SpO2: 94%   Weight: 149 lb 12.8 oz (67.9 kg)   Height: 5\' 4"  (1.626 m)    Body mass index is 25.71 kg/m. Physical Exam Constitutional:      General: She is not in acute distress.    Appearance: She is well-developed. She is not diaphoretic.  HENT:     Head: Normocephalic and atraumatic.     Mouth/Throat:     Pharynx: No oropharyngeal exudate.  Eyes:     Conjunctiva/sclera: Conjunctivae normal.     Pupils: Pupils are equal, round, and reactive to light.  Cardiovascular:     Rate and Rhythm: Normal rate and regular rhythm.     Heart sounds: Normal heart sounds.  Pulmonary:     Effort: Pulmonary effort is normal.     Breath sounds: Normal breath sounds.  Abdominal:     General: Bowel sounds are normal.     Palpations: Abdomen is soft.  Musculoskeletal:     Cervical back: Normal range of motion and neck supple.     Right lower leg: No edema.     Left lower leg: No edema.     Comments: Right hand with contractures   Skin:    General: Skin is warm and dry.  Neurological:     Mental Status: She is alert.  Psychiatric:        Mood and Affect: Mood normal.     Labs reviewed: Recent Labs    12/29/22 0000 02/28/23 0000 03/31/23 0000  NA 141 143 141  K 3.4* 3.4* 3.8  CL 102 104 103  CO2 36* 34* 35*  BUN 18 14 19   CREATININE 0.6 0.6 0.6  CALCIUM 9.0 9.1 8.9   Recent Labs    11/29/22 0000 12/29/22 0000  AST 14 15  ALT 6* 9  ALKPHOS 43 39  ALBUMIN 3.1* 3.2*  Recent Labs    11/29/22 0000 12/29/22 0000  WBC 6.8 5.3  NEUTROABS 3,386.00 2,268.00  HGB  11.1* 11.8*  HCT 34* 36  PLT 98* 81*   Lab Results  Component Value Date   TSH 0.50 08/31/2022   Lab Results  Component Value Date   HGBA1C 5.6 01/27/2018   Lab Results  Component Value Date   CHOL 130 07/04/2023   HDL 47 07/04/2023   LDLCALC 69 07/04/2023   TRIG 68 07/04/2023   CHOLHDL 3.7 01/28/2018    Significant Diagnostic Results in last 30 days:  No results found.  Assessment/Plan 1. Hypertensive heart disease with chronic diastolic congestive heart failure (HCC) (Primary) -Blood pressure well controlled, goal bp <140/90 Continue current medications and dietary modifications follow metabolic panel Continues on lisinopril   2. Depression, major, single episode, moderate (HCC) Mood stable, continues on citalopram 30 mg daily   3. Hypothyroidism due to acquired atrophy of thyroid -TSH at goal on last labs, continues on synthroid  4. Other chronic pain Ongoing, continue position changes with tramadol and tylenol  5. Moderate vascular dementia with other behavioral disturbance (HCC) -Stable, no acute changes in cognitive or functional status, continue supportive care.  Behaviors moderately controlled on depakote, will continue current regimen   6. Stage 3 chronic kidney disease, unspecified whether stage 3a or 3b CKD (HCC) -Chronic and stable Encourage proper hydration Follow metabolic panel Avoid nephrotoxic meds (NSAIDS)  7. Thrombocytopenia (HCC) Platelet count 77 in November 2024, will continue to monitor.     Shawna [name]. Biagio Borg [facility] [phone]

## 2023-09-26 DIAGNOSIS — I679 Cerebrovascular disease, unspecified: Secondary | ICD-10-CM | POA: Diagnosis not present

## 2023-09-26 DIAGNOSIS — F01C4 Vascular dementia, severe, with anxiety: Secondary | ICD-10-CM | POA: Diagnosis not present

## 2023-09-26 DIAGNOSIS — F01C3 Vascular dementia, severe, with mood disturbance: Secondary | ICD-10-CM | POA: Diagnosis not present

## 2023-09-26 DIAGNOSIS — F01C18 Vascular dementia, severe, with other behavioral disturbance: Secondary | ICD-10-CM | POA: Diagnosis not present

## 2023-10-24 ENCOUNTER — Encounter: Payer: Self-pay | Admitting: Adult Health

## 2023-10-24 ENCOUNTER — Non-Acute Institutional Stay (SKILLED_NURSING_FACILITY): Payer: Self-pay | Admitting: Adult Health

## 2023-10-24 DIAGNOSIS — I10 Essential (primary) hypertension: Secondary | ICD-10-CM

## 2023-10-24 DIAGNOSIS — R197 Diarrhea, unspecified: Secondary | ICD-10-CM

## 2023-10-24 MED ORDER — POLYETHYLENE GLYCOL 3350 17 G PO PACK: 17.0000 g | PACK | ORAL | Status: DC

## 2023-10-24 NOTE — Progress Notes (Signed)
 Location:  Other ([facility]) Nursing Home Room Number: 407 [id number] Place of Service:  SNF ([phone]) Provider:  Kenard Gower, DNP, FNP-BC  Patient Care Team: Earnestine Mealing, MD as PCP - General (Family Medicine) Mariah Milling, Tollie Pizza, MD as Consulting Physician (Cardiology) Sharee Holster, NP as Nurse Practitioner (Geriatric Medicine)  Extended Emergency Contact Information Primary Emergency Contact: Asano,[name] Address: [address]          [place], [place] [address] D[place] of [place] Phone: [phone] Relation: Daughter Secondary Emergency Contact: Freda Munro Address: [address]          [place], [place] [address] Darden Amber of [place] Home Phone: [phone] Mobile Phone: [phone] Relation: Spouse  Code Status:   DNR  Goals of care: Advanced Directive information    09/22/2023    8:20 AM  Advanced Directives  Does Patient Have a Medical Advance Directive? Yes  Type of Estate agent of [place];Out of facility DNR (pink MOST or yellow form);Living will  Does patient want to make changes to medical advance directive? No - Patient declined  Copy of Healthcare Power of Attorney in Chart? Yes - validated most recent copy scanned in chart (See row information)     Chief Complaint  Patient presents with   Acute Visit    Loose BM    HPI:  Pt is a 88 y.o. female seen today for an acute visit for loose BM. She is a resident of [facility] She was noted to have watery stool. She currently takes Miralax  daily.    BP 137/85, takes Hydrochlorothiazide 12.5 mg daily for hypertension.  Past Medical History:  Diagnosis Date   Cerebral infarction [facility])    CKD (chronic kidney disease), stage III (HCC)    Depression    DJD (degenerative joint disease)    Edema    High cholesterol    Hyperlipidemia, unspecified    Hypertension    Hypothyroidism    Lower GI bleed 01/31/2019   Last  Assessment & Plan:  Formatting of this note might be different from the original. Noted several episodes here and at [facility]. Was on dual antiplatelets and still is but asa dose was lowered. She is doing well now and in good spirits today in her room in isolation given covid. No abd pain and eating well with normal bm's per staff.   Malaise    Morbid obesity (HCC)    Osteoarthritis    Parathyroid abnormality (HCC)    Seizures (HCC)    a. following remote stroke.   Stroke [facility]) 2002, 05/2017, 01/26/18, 01/30/18   a. 1964-->residual right arm wkns.  Previously on coumadin - pt says for just a few yrs.   Subdural hematoma (HCC)    a. 10/2001 [facility] req temporal frontoparietal craniotomy following fall. ? whether or not pt on coumadin @ time.  Notes indicate yes but pt denies.   Thyroid disease    TMJ (dislocation of temporomandibular joint)    Tuberculosis    a. ~ 1950   Urinary incontinence    Weight loss    Past Surgical History:  Procedure Laterality Date   ABDOMINAL HYSTERECTOMY     BACK SURGERY     BRAIN SURGERY     cataract surgery     2014   LEFT HEART CATH AND CORONARY ANGIOGRAPHY N/A 06/13/2017   Procedure: LEFT HEART CATH AND CORONARY ANGIOGRAPHY;  Surgeon: Iran Ouch, MD;  Location: [facility] INVASIVE  CV LAB;  Service: Cardiovascular;  Laterality: N/A;   PARATHYROID ADENOMA REMOVAL     TEE WITHOUT CARDIOVERSION N/A 01/31/2018   Procedure: TRANSESOPHAGEAL ECHOCARDIOGRAM (TEE);  Surgeon: Yvonne Kendall, MD;  Location: [facility] ORS;  Service: Cardiovascular;  Laterality: N/A;   TEE WITHOUT CARDIOVERSION N/A 02/01/2018   Procedure: TRANSESOPHAGEAL ECHOCARDIOGRAM (TEE);  Surgeon: Antonieta Iba, MD;  Location: [facility] ORS;  Service: Cardiovascular;  Laterality: N/A;    No Known Allergies  Outpatient Encounter Medications as of 10/24/2023  Medication Sig   acetaminophen (TYLENOL) 325 MG tablet Take 650 mg by mouth 3 (three) times daily. Scheduled, see as needed listing   atorvastatin  (LIPITOR) 80 MG tablet Take 1 tablet (80 mg total) by mouth daily at 6 PM.   Citalopram Hydrobromide 30 MG CAPS Take 30 mg by mouth daily.   clopidogrel (PLAVIX) 75 MG tablet Take 1 tablet (75 mg total) by mouth daily.   divalproex (DEPAKOTE SPRINKLE) 125 MG capsule Take 250 mg by mouth 2 (two) times daily.   hydrochlorothiazide (MICROZIDE) 12.5 MG capsule Take 12.5 mg by mouth daily.   Infant Care Products [facility]) OINT Apply 1 application  topically every 12 (twelve) hours as needed (apply to axilla, groin, and under breast).   latanoprost (XALATAN) 0.005 % ophthalmic solution Place 1 drop into both eyes at bedtime.    levothyroxine (SYNTHROID) 88 MCG tablet Take 88 mcg by mouth daily. At bedtime   lisinopril (ZESTRIL) 20 MG tablet Take 20 mg by mouth daily.   NYAMYC powder Apply 1 Application topically every 12 (twelve) hours as needed (for skin breakdown, apply under breast).   polyethylene glycol (MIRALAX / GLYCOLAX) packet Take 17 g by mouth daily as needed.   potassium chloride SA (KLOR-CON M) 20 MEQ tablet Take 20 mEq by mouth daily.   sodium fluoride (SODIUM FLUORIDE 5000 PLUS) 1.1 % CREA dental cream Place 1 Application onto teeth at bedtime.   tamsulosin (FLOMAX) 0.4 MG CAPS capsule Take 1 capsule (0.4 mg total) by mouth daily.   traMADol (ULTRAM) 50 MG tablet TAKE 1 TABLET BY MOUTH TWICE DAILY;TAKE 1 TABLET BY MOUTH EVERY SIX HOURS AS NEEDED FOR INCREASED PAIN   No facility-administered encounter medications on file as of 10/24/2023.    Review of Systems  Unable to obtain due to dementia.    Immunization History  Administered Date(s) Administered   Influenza Split 07/18/2014, 07/23/2015   Influenza, High Dose Seasonal PF 06/14/2017, 06/15/2022   Influenza-Unspecified 05/25/2012, 05/31/2016, 06/01/2018, 06/21/2021, 06/22/2023   Moderna Covid-19 Vaccine Bivalent Booster 7yrs & up 01/26/2022, 07/09/2022   Moderna SARS-COV2 Booster Vaccination 07/11/2020, 01/16/2021    Moderna Sars-Covid-2 Vaccination 09/19/2019, 10/17/2019, 08/29/2023   PFIZER Comirnaty(Gray Top)Covid-19 Tri-Sucrose Vaccine 07/09/2022   PNEUMOCOCCAL CONJUGATE-20 02/07/2023   Pfizer Covid-19 Vaccine Bivalent Booster 70yrs & up 05/22/2021   Pneumococcal Polysaccharide-23 08/30/2004, 08/04/2017, 02/15/2019   Zoster Recombinant(Shingrix) 02/08/2023, 03/08/2023   Pertinent  Health Maintenance Due  Topic Date Due   INFLUENZA VACCINE  Completed   DEXA SCAN  Discontinued      01/30/2019    2:00 PM 04/26/2022   10:00 AM 05/19/2022    9:12 AM 06/02/2022    2:12 PM 02/03/2023    3:02 PM  Fall Risk  Falls in the past year?   0  0  Was there an injury with Fall?   0    Fall Risk Category Calculator   0    Fall Risk Category (Retired)   Low    (RETIRED)  Patient Fall Risk Level High fall risk High fall risk Moderate fall risk High fall risk   Patient at Risk for Falls Due to   History of fall(s)    Fall risk Follow up   Falls evaluation completed       Vitals:   10/24/23 1831  BP: 137/85  Pulse: 72  Resp: 18  Temp: (!) 97.1 F (36.2 C)  SpO2: 95%  Weight: 146 lb 3.2 oz (66.3 kg)  Height: 5' 3.5" (1.613 m)   Body mass index is 25.49 kg/m.  Physical Exam Constitutional:      Appearance: Normal appearance.  HENT:     Head: Normocephalic and atraumatic.     Nose: Nose normal.     Mouth/Throat:     Mouth: Mucous membranes are moist.  Eyes:     Conjunctiva/sclera: Conjunctivae normal.  Cardiovascular:     Rate and Rhythm: Normal rate and regular rhythm.  Pulmonary:     Effort: Pulmonary effort is normal.     Breath sounds: Normal breath sounds.  Abdominal:     General: Bowel sounds are normal.     Palpations: Abdomen is soft.  Musculoskeletal:     Cervical back: Normal range of motion.  Skin:    General: Skin is warm and dry.  Psychiatric:        Mood and Affect: Mood normal.        Behavior: Behavior normal.      Labs reviewed: Recent Labs    12/29/22 0000  02/28/23 0000 03/31/23 0000  NA 141 143 141  K 3.4* 3.4* 3.8  CL 102 104 103  CO2 36* 34* 35*  BUN 18 14 19   CREATININE 0.6 0.6 0.6  CALCIUM 9.0 9.1 8.9   Recent Labs    11/29/22 0000 12/29/22 0000  AST 14 15  ALT 6* 9  ALKPHOS 43 39  ALBUMIN 3.1* 3.2*   Recent Labs    11/29/22 0000 12/29/22 0000  WBC 6.8 5.3  NEUTROABS 3,386.00 2,268.00  HGB 11.1* 11.8*  HCT 34* 36  PLT 98* 81*   Lab Results  Component Value Date   TSH 0.50 08/31/2022   Lab Results  Component Value Date   HGBA1C 5.6 01/27/2018   Lab Results  Component Value Date   CHOL 130 07/04/2023   HDL 47 07/04/2023   LDLCALC 69 07/04/2023   TRIG 68 07/04/2023   CHOLHDL 3.7 01/28/2018    Significant Diagnostic Results in last 30 days:  No results found.  Assessment/Plan  1. Diarrhea, unspecified type (Primary) -  watery stool due to Miralax -  will decrease Miralax to every other day - polyethylene glycol (MIRALAX / GLYCOLAX) 17 g packet; Take 17 g by mouth every other day.  2. Primary hypertension -  BP stable -  continue current medication    Family/ staff Communication: Discussed plan of care with charge nurse.  Labs/tests ordered:  None    Kenard Gower, DNP, MSN, FNP-BC [facility] and Adult [facility] [phone] (Monday-Friday 8:00 a.m. - 5:00 p.m.) [phone] (after hours)

## 2023-10-26 DIAGNOSIS — F01C18 Vascular dementia, severe, with other behavioral disturbance: Secondary | ICD-10-CM | POA: Diagnosis not present

## 2023-10-26 DIAGNOSIS — F01C3 Vascular dementia, severe, with mood disturbance: Secondary | ICD-10-CM | POA: Diagnosis not present

## 2023-10-26 DIAGNOSIS — I679 Cerebrovascular disease, unspecified: Secondary | ICD-10-CM | POA: Diagnosis not present

## 2023-10-26 DIAGNOSIS — F01C4 Vascular dementia, severe, with anxiety: Secondary | ICD-10-CM | POA: Diagnosis not present

## 2023-10-27 ENCOUNTER — Other Ambulatory Visit: Payer: Self-pay | Admitting: Student

## 2023-10-27 DIAGNOSIS — G8929 Other chronic pain: Secondary | ICD-10-CM

## 2023-10-27 NOTE — Telephone Encounter (Signed)
 Patient is requesting a refill of the following medications: Requested Prescriptions   Pending Prescriptions Disp Refills   traMADol (ULTRAM) 50 MG tablet [Pharmacy Med Name: traMADol HCl 50 MG Tablet] 90 tablet 5    Sig: TAKE 1 TABLET BY MOUTH TWICE DAILY;TAKE 1 TABLET BY MOUTH EVERY 6 HOURS AS NEEDED FOR INCREASED PAIN    Date of last refill:07/01/2023  Refill amount: 240 tablets

## 2023-11-16 ENCOUNTER — Non-Acute Institutional Stay (SKILLED_NURSING_FACILITY): Payer: Self-pay | Admitting: Student

## 2023-11-16 ENCOUNTER — Encounter: Payer: Self-pay | Admitting: Student

## 2023-11-16 DIAGNOSIS — E034 Atrophy of thyroid (acquired): Secondary | ICD-10-CM

## 2023-11-16 DIAGNOSIS — G8929 Other chronic pain: Secondary | ICD-10-CM

## 2023-11-16 DIAGNOSIS — F325 Major depressive disorder, single episode, in full remission: Secondary | ICD-10-CM | POA: Diagnosis not present

## 2023-11-16 DIAGNOSIS — I110 Hypertensive heart disease with heart failure: Secondary | ICD-10-CM | POA: Diagnosis not present

## 2023-11-16 DIAGNOSIS — I5032 Chronic diastolic (congestive) heart failure: Secondary | ICD-10-CM | POA: Diagnosis not present

## 2023-11-16 DIAGNOSIS — N1832 Chronic kidney disease, stage 3b: Secondary | ICD-10-CM

## 2023-11-16 DIAGNOSIS — F0150 Vascular dementia without behavioral disturbance: Secondary | ICD-10-CM | POA: Diagnosis not present

## 2023-11-16 NOTE — Progress Notes (Signed)
 Location:  Other [facility].  Nursing Home Room Number: [facility] 407A Place of Service:  SNF [phone]) Provider:  Earnestine Mealing, MD  Patient Care Team: Earnestine Mealing, MD as PCP - General (Family Medicine) Mariah Milling, Tollie Pizza, MD as Consulting Physician (Cardiology) Sharee Holster, NP as Nurse Practitioner (Geriatric Medicine)  Extended Emergency Contact Information Primary Emergency Contact: Swords,[name] Address: [address]          [place], [place] [address] Darden [place] of [place] Phone: [phone] Relation: Daughter Secondary Emergency Contact: Freda Munro Address: [address]          [place], [place] [address] Darden [place] of [place] Home Phone: [phone] Mobile Phone: [phone] Relation: Spouse  Code Status:  DNR Goals of care: Advanced Directive information    11/16/2023   10:54 AM  Advanced Directives  Does Patient Have a Medical Advance Directive? Yes  Type of Estate agent of [place];Living will;Out of facility DNR (pink MOST or yellow form)  Does patient want to make changes to medical advance directive? No - Patient declined  Copy of Healthcare Power of Attorney in Chart? Yes - validated most recent copy scanned in chart (See row information)     Chief Complaint  Patient presents with   Medical Management of Chronic Issues    Medical Management of Chronic Issues.     HPI:  Pt is a 88 y.o. female seen today for medical management of chronic diseases.   Patient is watching television and smiling. She says she is fine and doing well.   Nursing with concern that patient continues to cry in pain often. She has been on tramadol for some time without significant success. Weight remains stable for the last year. She requires total care and a lift for transfers.   Past Medical History:  Diagnosis Date   Cerebral infarction [facility])    CKD (chronic kidney disease), stage III (HCC)    Depression    DJD  (degenerative joint disease)    Edema    High cholesterol    Hyperlipidemia, unspecified    Hypertension    Hypothyroidism    Lower GI bleed 01/31/2019   Last Assessment & Plan:  Formatting of this note might be different from the original. Noted several episodes here and at [facility]. Was on dual antiplatelets and still is but asa dose was lowered. She is doing well now and in good spirits today in her room in isolation given covid. No abd pain and eating well with normal bm's per staff.   Malaise    Morbid obesity (HCC)    Osteoarthritis    Parathyroid abnormality (HCC)    Seizures (HCC)    a. following remote stroke.   Stroke [facility]) 2002, 05/2017, 01/26/18, 01/30/18   a. 1964-->residual right arm wkns.  Previously on coumadin - pt says for just a few yrs.   Subdural hematoma (HCC)    a. 10/2001 [facility] req temporal frontoparietal craniotomy following fall. ? whether or not pt on coumadin @ time.  Notes indicate yes but pt denies.   Thyroid disease    TMJ (dislocation of temporomandibular joint)    Tuberculosis    a. ~ 1950   Urinary incontinence    Weight loss    Past Surgical History:  Procedure Laterality Date   ABDOMINAL HYSTERECTOMY     BACK SURGERY     BRAIN SURGERY     cataract surgery     2014   LEFT HEART CATH  AND CORONARY ANGIOGRAPHY N/A 06/13/2017   Procedure: LEFT HEART CATH AND CORONARY ANGIOGRAPHY;  Surgeon: Iran Ouch, MD;  Location: [facility] INVASIVE CV LAB;  Service: Cardiovascular;  Laterality: N/A;   PARATHYROID ADENOMA REMOVAL     TEE WITHOUT CARDIOVERSION N/A 01/31/2018   Procedure: TRANSESOPHAGEAL ECHOCARDIOGRAM (TEE);  Surgeon: Yvonne Kendall, MD;  Location: [facility] ORS;  Service: Cardiovascular;  Laterality: N/A;   TEE WITHOUT CARDIOVERSION N/A 02/01/2018   Procedure: TRANSESOPHAGEAL ECHOCARDIOGRAM (TEE);  Surgeon: Antonieta Iba, MD;  Location: [facility] ORS;  Service: Cardiovascular;  Laterality: N/A;    No Known Allergies  Outpatient Encounter Medications as of  11/16/2023  Medication Sig   acetaminophen (TYLENOL) 325 MG tablet Take 650 mg by mouth 3 (three) times daily. Scheduled, see as needed listing   atorvastatin (LIPITOR) 80 MG tablet Take 1 tablet (80 mg total) by mouth daily at 6 PM.   Citalopram Hydrobromide 30 MG CAPS Take 30 mg by mouth daily.   clopidogrel (PLAVIX) 75 MG tablet Take 1 tablet (75 mg total) by mouth daily.   divalproex (DEPAKOTE SPRINKLE) 125 MG capsule Take 250 mg by mouth 2 (two) times daily.   hydrochlorothiazide (MICROZIDE) 12.5 MG capsule Take 12.5 mg by mouth daily.   Infant Care Products [facility]) OINT Apply 1 application  topically every 12 (twelve) hours as needed (apply to axilla, groin, and under breast).   latanoprost (XALATAN) 0.005 % ophthalmic solution Place 1 drop into both eyes at bedtime.    levothyroxine (SYNTHROID) 88 MCG tablet Take 88 mcg by mouth daily. At bedtime   lisinopril (ZESTRIL) 20 MG tablet Take 20 mg by mouth daily.   LORazepam (ATIVAN) 1 MG tablet Take 1 mg by mouth. One time only for dental procedure for 1 day   NYAMYC powder Apply 1 Application topically every 12 (twelve) hours as needed (for skin breakdown, apply under breast).   polyethylene glycol (MIRALAX / GLYCOLAX) 17 g packet Take 17 g by mouth every other day.   potassium chloride SA (KLOR-CON M) 20 MEQ tablet Take 20 mEq by mouth daily.   sodium fluoride (SODIUM FLUORIDE 5000 PLUS) 1.1 % CREA dental cream Place 1 Application onto teeth at bedtime.   tamsulosin (FLOMAX) 0.4 MG CAPS capsule Take 1 capsule (0.4 mg total) by mouth daily.   traMADol (ULTRAM) 50 MG tablet TAKE 1 TABLET BY MOUTH TWICE DAILY;TAKE 1 TABLET BY MOUTH EVERY 6 HOURS AS NEEDED FOR INCREASED PAIN   No facility-administered encounter medications on file as of 11/16/2023.    Review of Systems  Immunization History  Administered Date(s) Administered   Influenza Split 07/18/2014, 07/23/2015   Influenza, High Dose Seasonal PF 06/14/2017, 06/15/2022    Influenza-Unspecified 05/25/2012, 05/31/2016, 06/01/2018, 06/21/2021, 06/22/2023   Moderna Covid-19 Vaccine Bivalent Booster 25yrs & up 01/26/2022, 07/09/2022   Moderna SARS-COV2 Booster Vaccination 07/11/2020, 01/16/2021   Moderna Sars-Covid-2 Vaccination 09/19/2019, 10/17/2019, 08/29/2023   PFIZER Comirnaty(Gray Top)Covid-19 Tri-Sucrose Vaccine 07/09/2022   PNEUMOCOCCAL CONJUGATE-20 02/07/2023   Pfizer Covid-19 Vaccine Bivalent Booster 35yrs & up 05/22/2021   Pneumococcal Polysaccharide-23 08/30/2004, 08/04/2017, 02/15/2019   Zoster Recombinant(Shingrix) 02/08/2023, 03/08/2023   Pertinent  Health Maintenance Due  Topic Date Due   INFLUENZA VACCINE  Completed   DEXA SCAN  Discontinued      01/30/2019    2:00 PM 04/26/2022   10:00 AM 05/19/2022    9:12 AM 06/02/2022    2:12 PM 02/03/2023    3:02 PM  Fall Risk  Falls in the past year?  0  0  Was there an injury with Fall?   0    Fall Risk Category Calculator   0    Fall Risk Category (Retired)   Low    (RETIRED) Patient Fall Risk Level High fall risk High fall risk Moderate fall risk High fall risk   Patient at Risk for Falls Due to   History of fall(s)    Fall risk Follow up   Falls evaluation completed     Functional Status Survey:    Vitals:   11/16/23 1046 11/16/23 1055  BP: (!) 145/84 139/80  Pulse: 80   Resp: 20   Temp: (!) 97 F (36.1 C)   SpO2: 92%   Weight: 150 lb 1.6 oz (68.1 kg)   Height: 5\' 4"  (1.626 m)    Body mass index is 25.76 kg/m. Physical Exam Cardiovascular:     Rate and Rhythm: Normal rate.     Pulses: Normal pulses.  Pulmonary:     Effort: Pulmonary effort is normal.  Neurological:     Mental Status: She is alert. Mental status is at baseline.     Labs reviewed: Recent Labs    12/29/22 0000 02/28/23 0000 03/31/23 0000  NA 141 143 141  K 3.4* 3.4* 3.8  CL 102 104 103  CO2 36* 34* 35*  BUN 18 14 19   CREATININE 0.6 0.6 0.6  CALCIUM 9.0 9.1 8.9   Recent Labs    11/29/22 0000  12/29/22 0000  AST 14 15  ALT 6* 9  ALKPHOS 43 39  ALBUMIN 3.1* 3.2*   Recent Labs    11/29/22 0000 12/29/22 0000  WBC 6.8 5.3  NEUTROABS 3,386.00 2,268.00  HGB 11.1* 11.8*  HCT 34* 36  PLT 98* 81*   Lab Results  Component Value Date   TSH 0.50 08/31/2022   Lab Results  Component Value Date   HGBA1C 5.6 01/27/2018   Lab Results  Component Value Date   CHOL 130 07/04/2023   HDL 47 07/04/2023   LDLCALC 69 07/04/2023   TRIG 68 07/04/2023   CHOLHDL 3.7 01/28/2018    Significant Diagnostic Results in last 30 days:  No results found.  Assessment/Plan Dementia, multiinfarct, without behavioral disturbance (HCC)  Hypertensive heart disease with chronic diastolic congestive heart failure (HCC)  Hypothyroidism due to acquired atrophy of thyroid  Stage 3b chronic kidney disease (HCC)  Major depression in remission (HCC)  Other chronic pain Patient with history of dementia. She requires support for all activities of daily life. She is conversant during a conversation, however, she has significant limitations in function. Concern for high risk of decline in the event she has an illness in the upcoming months. History of CHF however appears to be euvolemic on exam. Chronic pain and depression mildly controlled. She has had citalopram 30 mg daily and depakote. There was a recent trial of dose reduction of her medications. Crying has increased over the last few months. Concern she has not tolerated the dose reduction. Will contact family regarding goals of care given continued pain. Hx of stroke continue plavix. She has ativan as needed, will encourage nursing to use as indicated. Continue levothyroxine   Family/ staff Communication: nursing  Labs/tests ordered: q2mo

## 2023-11-17 ENCOUNTER — Encounter: Payer: Self-pay | Admitting: Student

## 2023-11-21 DIAGNOSIS — I10 Essential (primary) hypertension: Secondary | ICD-10-CM | POA: Diagnosis not present

## 2023-11-21 LAB — CBC AND DIFFERENTIAL
HCT: 37 (ref 36–46)
Hemoglobin: 11.9 — AB (ref 12.0–16.0)
Platelets: 93 10*3/uL — AB (ref 150–400)
WBC: 5.7

## 2023-11-21 LAB — CBC: RBC: 3.67 — AB (ref 3.87–5.11)

## 2023-11-23 DIAGNOSIS — F01C4 Vascular dementia, severe, with anxiety: Secondary | ICD-10-CM | POA: Diagnosis not present

## 2023-11-23 DIAGNOSIS — F01C18 Vascular dementia, severe, with other behavioral disturbance: Secondary | ICD-10-CM | POA: Diagnosis not present

## 2023-11-23 DIAGNOSIS — I679 Cerebrovascular disease, unspecified: Secondary | ICD-10-CM | POA: Diagnosis not present

## 2023-11-23 DIAGNOSIS — F01C3 Vascular dementia, severe, with mood disturbance: Secondary | ICD-10-CM | POA: Diagnosis not present

## 2023-11-28 ENCOUNTER — Encounter: Payer: Self-pay | Admitting: Student

## 2023-11-28 ENCOUNTER — Encounter: Payer: Self-pay | Admitting: Nurse Practitioner

## 2023-12-09 ENCOUNTER — Other Ambulatory Visit: Payer: Self-pay | Admitting: Student

## 2023-12-09 DIAGNOSIS — G8929 Other chronic pain: Secondary | ICD-10-CM

## 2023-12-09 MED ORDER — TRAMADOL HCL 50 MG PO TABS
ORAL_TABLET | ORAL | 0 refills | Status: DC
Start: 2023-12-09 — End: 2024-01-24

## 2023-12-09 NOTE — Progress Notes (Signed)
 Chronic pain medication sent.

## 2023-12-21 DIAGNOSIS — F01C4 Vascular dementia, severe, with anxiety: Secondary | ICD-10-CM | POA: Diagnosis not present

## 2023-12-21 DIAGNOSIS — I679 Cerebrovascular disease, unspecified: Secondary | ICD-10-CM | POA: Diagnosis not present

## 2023-12-21 DIAGNOSIS — F01C3 Vascular dementia, severe, with mood disturbance: Secondary | ICD-10-CM | POA: Diagnosis not present

## 2023-12-21 DIAGNOSIS — F01C18 Vascular dementia, severe, with other behavioral disturbance: Secondary | ICD-10-CM | POA: Diagnosis not present

## 2023-12-29 ENCOUNTER — Encounter: Payer: Self-pay | Admitting: Nurse Practitioner

## 2023-12-29 ENCOUNTER — Non-Acute Institutional Stay (SKILLED_NURSING_FACILITY): Payer: Self-pay | Admitting: Nurse Practitioner

## 2023-12-29 DIAGNOSIS — G8929 Other chronic pain: Secondary | ICD-10-CM

## 2023-12-29 DIAGNOSIS — F321 Major depressive disorder, single episode, moderate: Secondary | ICD-10-CM | POA: Diagnosis not present

## 2023-12-29 DIAGNOSIS — N1832 Chronic kidney disease, stage 3b: Secondary | ICD-10-CM

## 2023-12-29 DIAGNOSIS — I5032 Chronic diastolic (congestive) heart failure: Secondary | ICD-10-CM

## 2023-12-29 DIAGNOSIS — I10 Essential (primary) hypertension: Secondary | ICD-10-CM | POA: Diagnosis not present

## 2023-12-29 DIAGNOSIS — E034 Atrophy of thyroid (acquired): Secondary | ICD-10-CM

## 2023-12-29 DIAGNOSIS — F0150 Vascular dementia without behavioral disturbance: Secondary | ICD-10-CM

## 2023-12-29 DIAGNOSIS — F03918 Unspecified dementia, unspecified severity, with other behavioral disturbance: Secondary | ICD-10-CM

## 2023-12-29 DIAGNOSIS — I110 Hypertensive heart disease with heart failure: Secondary | ICD-10-CM | POA: Diagnosis not present

## 2023-12-29 DIAGNOSIS — G40909 Epilepsy, unspecified, not intractable, without status epilepticus: Secondary | ICD-10-CM | POA: Diagnosis not present

## 2023-12-29 DIAGNOSIS — F325 Major depressive disorder, single episode, in full remission: Secondary | ICD-10-CM | POA: Diagnosis not present

## 2023-12-29 DIAGNOSIS — D649 Anemia, unspecified: Secondary | ICD-10-CM | POA: Diagnosis not present

## 2023-12-29 DIAGNOSIS — E079 Disorder of thyroid, unspecified: Secondary | ICD-10-CM | POA: Diagnosis not present

## 2023-12-29 DIAGNOSIS — I130 Hypertensive heart and chronic kidney disease with heart failure and stage 1 through stage 4 chronic kidney disease, or unspecified chronic kidney disease: Secondary | ICD-10-CM | POA: Diagnosis not present

## 2023-12-29 LAB — BASIC METABOLIC PANEL WITH GFR
BUN: 16 (ref 4–21)
CO2: 34 — AB (ref 13–22)
Chloride: 101 (ref 99–108)
Creatinine: 0.6 (ref 0.5–1.1)
Glucose: 78
Potassium: 4.4 meq/L (ref 3.5–5.1)
Sodium: 142 (ref 137–147)

## 2023-12-29 LAB — HEPATIC FUNCTION PANEL
ALT: 10 U/L (ref 7–35)
AST: 16 (ref 13–35)
Alkaline Phosphatase: 52 (ref 25–125)
Bilirubin, Total: 0.8

## 2023-12-29 LAB — CBC AND DIFFERENTIAL
HCT: 38 (ref 36–46)
Hemoglobin: 12.5 (ref 12.0–16.0)
Neutrophils Absolute: 3660
Platelets: 96 10*3/uL — AB (ref 150–400)
WBC: 6.3

## 2023-12-29 LAB — CBC: RBC: 3.89 (ref 3.87–5.11)

## 2023-12-29 LAB — COMPREHENSIVE METABOLIC PANEL WITH GFR
Albumin: 3.6 (ref 3.5–5.0)
Calcium: 9.5 (ref 8.7–10.7)
Globulin: 2.3
eGFR: 87

## 2023-12-29 NOTE — Progress Notes (Unsigned)
 Location:  Other [facility].  Nursing Home Room Number: [facility] [address]A Place of Service:  SNF [phone]) Gilbert Lab, NP  PCP: Valrie Gehrig, MD  Patient Care Team: Valrie Gehrig, MD as PCP - General (Family Medicine) Jerelene Monday, Deadra Everts, MD as Consulting Physician (Cardiology) Marrie Sizer Marcellus Sers, NP as Nurse Practitioner (Geriatric Medicine)  Extended Emergency Contact Information Primary Emergency Contact: Spindel,[name] Address: [address]          [place], [place] [address] [place] Mobile Phone: [phone] Relation: Daughter Secondary Emergency Contact: Carson Clara Address: [address]          [place], [place] [address] [place] Home Phone: [phone] Mobile Phone: [phone] Relation: Spouse  Goals of care: Advanced Directive information    11/16/2023   10:54 AM  Advanced Directives  Does Patient Have a Medical Advance Directive? Yes  Type of Estate agent of [place];Living will;Out of facility DNR (pink MOST or yellow form)  Does patient want to make changes to medical advance directive? No - Patient declined  Copy of Healthcare Power of Attorney in Chart? Yes - validated most recent copy scanned in chart (See row information)     Chief Complaint  Patient presents with   Medical Management of Chronic Issues    Medical Management of Chronic Issues.     HPI:  Pt is a 88 y.o. female seen today for medical management of chronic disease.  Pt with hx of dementia, CKD, OA, hypothyroid, hyperlipidemia, anemia.  She is being followed by psych due to ongoing behaviors depakote  was recently increased.  Blood pressure has been well controlled.  Weight has been stable.  She reports she is doing well during visit.  Staff has no acute concerns.    Past Medical History:  Diagnosis Date   Cerebral infarction [facility])    CKD (chronic kidney disease), stage III (HCC)    Depression    DJD  (degenerative joint disease)    Edema    High cholesterol    Hyperlipidemia, unspecified    Hypertension    Hypothyroidism    Lower GI bleed 01/31/2019   Last Assessment & Plan:  Formatting of this note might be different from the original. Noted several episodes here and at [facility]. Was on dual antiplatelets and still is but asa dose was lowered. She is doing well now and in good spirits today in her room in isolation given covid. No abd pain and eating well with normal bm's per staff.   Malaise    Morbid obesity (HCC)    Osteoarthritis    Parathyroid  abnormality (HCC)    Seizures (HCC)    a. following remote stroke.   Stroke [facility]) 2002, 05/2017, 01/26/18, 01/30/18   a. 1964-->residual right arm wkns.  Previously on coumadin - pt says for just a few yrs.   Subdural hematoma (HCC)    a. 10/2001 [facility] req temporal frontoparietal craniotomy following fall. ? whether or not pt on coumadin @ time.  Notes indicate yes but pt denies.   Thyroid  disease    TMJ (dislocation of temporomandibular joint)    Tuberculosis    a. ~ 1950   Urinary incontinence    Weight loss    Past Surgical History:  Procedure Laterality Date   ABDOMINAL HYSTERECTOMY     BACK SURGERY     BRAIN SURGERY     cataract surgery     2014   LEFT HEART CATH AND CORONARY ANGIOGRAPHY  N/A 06/13/2017   Procedure: LEFT HEART CATH AND CORONARY ANGIOGRAPHY;  Surgeon: Wenona Hamilton, MD;  Location: [facility] INVASIVE CV LAB;  Service: Cardiovascular;  Laterality: N/A;   PARATHYROID  ADENOMA REMOVAL     TEE WITHOUT CARDIOVERSION N/A 01/31/2018   Procedure: TRANSESOPHAGEAL ECHOCARDIOGRAM (TEE);  Surgeon: Sammy Crisp, MD;  Location: [facility] ORS;  Service: Cardiovascular;  Laterality: N/A;   TEE WITHOUT CARDIOVERSION N/A 02/01/2018   Procedure: TRANSESOPHAGEAL ECHOCARDIOGRAM (TEE);  Surgeon: Devorah Fonder, MD;  Location: [facility] ORS;  Service: Cardiovascular;  Laterality: N/A;    No Known Allergies  Outpatient Encounter Medications as of  12/29/2023  Medication Sig   acetaminophen  (TYLENOL ) 325 MG tablet Take 650 mg by mouth 3 (three) times daily. Scheduled, see as needed listing   atorvastatin  (LIPITOR) 80 MG tablet Take 1 tablet (80 mg total) by mouth daily at 6 PM.   Citalopram  Hydrobromide 30 MG CAPS Take 30 mg by mouth daily.   clopidogrel  (PLAVIX ) 75 MG tablet Take 1 tablet (75 mg total) by mouth daily.   divalproex  (DEPAKOTE  SPRINKLE) 125 MG capsule Take 375 mg by mouth 2 (two) times daily.   hydrochlorothiazide  (MICROZIDE ) 12.5 MG capsule Take 12.5 mg by mouth daily.   Infant Care Products [facility]) OINT Apply 1 application  topically every 12 (twelve) hours as needed (apply to axilla, groin, and under breast).   latanoprost  (XALATAN ) 0.005 % ophthalmic solution Place 1 drop into both eyes at bedtime.    levothyroxine  (SYNTHROID ) 88 MCG tablet Take 88 mcg by mouth daily. At bedtime   lisinopril  (ZESTRIL ) 20 MG tablet Take 20 mg by mouth daily.   NYAMYC powder Apply 1 Application topically every 12 (twelve) hours as needed (for skin breakdown, apply under breast).   polyethylene glycol (MIRALAX  / GLYCOLAX ) 17 g packet Take 17 g by mouth every other day.   potassium chloride  SA (KLOR-CON  M) 20 MEQ tablet Take 20 mEq by mouth daily.   sodium fluoride (SODIUM FLUORIDE 5000 PLUS) 1.1 % CREA dental cream Place 1 Application onto teeth at bedtime.   tamsulosin  (FLOMAX ) 0.4 MG CAPS capsule Take 1 capsule (0.4 mg total) by mouth daily.   traMADol  (ULTRAM ) 50 MG tablet TAKE 1 TABLET BY MOUTH TWICE DAILY;TAKE 1 TABLET BY MOUTH EVERY 6 HOURS AS NEEDED FOR INCREASED PAIN   [DISCONTINUED] LORazepam  (ATIVAN ) 1 MG tablet Take 1 mg by mouth. One time only for dental procedure for 1 day (Patient not taking: Reported on 12/29/2023)   No facility-administered encounter medications on file as of 12/29/2023.    Review of Systems  Unable to perform ROS: Dementia     Immunization History  Administered Date(s) Administered   Influenza Split  07/18/2014, 07/23/2015   Influenza, High Dose Seasonal PF 06/14/2017, 06/15/2022   Influenza-Unspecified 05/25/2012, 05/31/2016, 06/01/2018, 06/21/2021, 06/22/2023   Moderna Covid-19 Vaccine Bivalent Booster 39yrs & up 01/26/2022, 07/09/2022   Moderna SARS-COV2 Booster Vaccination 07/11/2020, 01/16/2021   Moderna Sars-Covid-2 Vaccination 09/19/2019, 10/17/2019, 08/29/2023   PFIZER Comirnaty(Gray Top)Covid-19 Tri-Sucrose Vaccine 07/09/2022   PNEUMOCOCCAL CONJUGATE-20 02/07/2023   Pfizer Covid-19 Vaccine Bivalent Booster 61yrs & up 05/22/2021   Pneumococcal Polysaccharide-23 08/30/2004, 08/04/2017, 02/15/2019   Zoster Recombinant(Shingrix) 02/08/2023, 03/08/2023   Pertinent  Health Maintenance Due  Topic Date Due   INFLUENZA VACCINE  03/30/2024   DEXA SCAN  Discontinued      01/30/2019    2:00 PM 04/26/2022   10:00 AM 05/19/2022    9:12 AM 06/02/2022    2:12 PM 02/03/2023  3:02 PM  Fall Risk  Falls in the past year?   0  0  Was there an injury with Fall?   0    Fall Risk Category Calculator   0    Fall Risk Category (Retired)   Low    (RETIRED) Patient Fall Risk Level High fall risk High fall risk Moderate fall risk High fall risk   Patient at Risk for Falls Due to   History of fall(s)    Fall risk Follow up   Falls evaluation completed     Functional Status Survey:    Vitals:   12/29/23 1308  BP: 138/77  Pulse: 87  Resp: 17  Temp: 98.5 F (36.9 C)  SpO2: 92%  Weight: 150 lb 3.2 oz (68.1 kg)  Height: 5\' 4"  (1.626 m)   Body mass index is 25.78 kg/m. Physical Exam Constitutional:      General: She is not in acute distress.    Appearance: She is well-developed. She is not diaphoretic.  HENT:     Head: Normocephalic and atraumatic.     Mouth/Throat:     Pharynx: No oropharyngeal exudate.  Eyes:     Conjunctiva/sclera: Conjunctivae normal.     Pupils: Pupils are equal, round, and reactive to light.  Cardiovascular:     Rate and Rhythm: Normal rate and regular rhythm.      Heart sounds: Normal heart sounds.  Pulmonary:     Effort: Pulmonary effort is normal.     Breath sounds: Normal breath sounds.  Abdominal:     General: Bowel sounds are normal.     Palpations: Abdomen is soft.  Musculoskeletal:     Cervical back: Normal range of motion and neck supple.     Right lower leg: No edema.     Left lower leg: No edema.  Skin:    General: Skin is warm and dry.  Neurological:     Mental Status: She is alert.  Psychiatric:        Mood and Affect: Mood normal.     Labs reviewed: Recent Labs    02/28/23 0000 03/31/23 0000  NA 143 141  K 3.4* 3.8  CL 104 103  CO2 34* 35*  BUN 14 19  CREATININE 0.6 0.6  CALCIUM  9.1 8.9   No results for input(s): "AST", "ALT", "ALKPHOS", "BILITOT", "PROT", "ALBUMIN" in the last 8760 hours. Recent Labs    11/21/23 0000  WBC 5.7  HGB 11.9*  HCT 37  PLT 93*   Lab Results  Component Value Date   TSH 0.50 08/31/2022   Lab Results  Component Value Date   HGBA1C 5.6 01/27/2018   Lab Results  Component Value Date   CHOL 130 07/04/2023   HDL 47 07/04/2023   LDLCALC 69 07/04/2023   TRIG 68 07/04/2023   CHOLHDL 3.7 01/28/2018    Significant Diagnostic Results in last 30 days:  No results found.  Assessment/Plan CKD (chronic kidney disease), stage III (HCC) Chronic and stable Encourage proper hydration Follow metabolic panel Avoid nephrotoxic meds (NSAIDS)  Dementia, multiinfarct, without behavioral disturbance (HCC) Stable, no acute changes in cognitive or functional status, continue supportive care.   Depression, major, single episode, moderate (HCC) Stable on celexa . Continues to have some episodes of crying out per staff, encouraged redirection and nonpharmacologic interventions    Hypertensive heart disease with congestive heart failure (HCC) Euvolemic Continue current medications and dietary modifications follow metabolic panel  Hypothyroidism due to acquired atrophy of thyroid  TSH at  goal on synthroid  88 mcg  Other chronic pain Stable, she is nonambulatory in gerichair, Continues on scheduled tramadol .   Primary hypertension Blood pressure well controlled, goal bp <140/90 Continue current medications and dietary modifications follow metabolic panel     Jessica K. Denney Fisherman [facility] [phone]

## 2024-01-04 DIAGNOSIS — G8929 Other chronic pain: Secondary | ICD-10-CM | POA: Insufficient documentation

## 2024-01-05 ENCOUNTER — Encounter: Payer: Self-pay | Admitting: Nurse Practitioner

## 2024-01-05 NOTE — Assessment & Plan Note (Signed)
 Chronic and stable Encourage proper hydration Follow metabolic panel Avoid nephrotoxic meds (NSAIDS)

## 2024-01-05 NOTE — Assessment & Plan Note (Signed)
 Stable on celexa . Continues to have some episodes of crying out per staff, encouraged redirection and nonpharmacologic interventions

## 2024-01-05 NOTE — Assessment & Plan Note (Signed)
 Blood pressure well controlled, goal bp <140/90 Continue current medications and dietary modifications follow metabolic panel

## 2024-01-05 NOTE — Assessment & Plan Note (Addendum)
 Euvolemic Continue current medications and dietary modifications follow metabolic panel

## 2024-01-05 NOTE — Assessment & Plan Note (Signed)
 TSH at goal on synthroid  88 mcg

## 2024-01-05 NOTE — Assessment & Plan Note (Signed)
 Stable, she is nonambulatory in gerichair, Continues on scheduled tramadol .

## 2024-01-05 NOTE — Assessment & Plan Note (Signed)
 Stable, no acute changes in cognitive or functional status, continue supportive care.

## 2024-01-18 DIAGNOSIS — I679 Cerebrovascular disease, unspecified: Secondary | ICD-10-CM | POA: Diagnosis not present

## 2024-01-18 DIAGNOSIS — F01C3 Vascular dementia, severe, with mood disturbance: Secondary | ICD-10-CM | POA: Diagnosis not present

## 2024-01-18 DIAGNOSIS — F01C4 Vascular dementia, severe, with anxiety: Secondary | ICD-10-CM | POA: Diagnosis not present

## 2024-01-18 DIAGNOSIS — F01C18 Vascular dementia, severe, with other behavioral disturbance: Secondary | ICD-10-CM | POA: Diagnosis not present

## 2024-01-21 ENCOUNTER — Other Ambulatory Visit: Payer: Self-pay | Admitting: Student

## 2024-01-21 DIAGNOSIS — G8929 Other chronic pain: Secondary | ICD-10-CM

## 2024-01-24 NOTE — Telephone Encounter (Signed)
 [facility] Patient Pended Rx and sent to [place] for approval.

## 2024-01-24 NOTE — Telephone Encounter (Signed)
 Pharmacy requested refill

## 2024-01-26 ENCOUNTER — Telehealth: Payer: Medicare PPO | Admitting: Oncology

## 2024-02-08 ENCOUNTER — Encounter: Payer: Self-pay | Admitting: Student

## 2024-02-08 NOTE — Progress Notes (Signed)
 This encounter was created in error - please disregard.

## 2024-02-13 ENCOUNTER — Telehealth: Payer: Self-pay | Admitting: Pharmacist

## 2024-02-13 DIAGNOSIS — E7800 Pure hypercholesterolemia, unspecified: Secondary | ICD-10-CM

## 2024-02-13 NOTE — Progress Notes (Signed)
   02/13/2024  Patient ID: Shawna [name], female   DOB: 1936-07-07, 88 y.o.   MRN: [id number]  Pharmacy Quality Measure Review  This patient is appearing on a report for being at risk of failing the adherence measure for cholesterol (statin) medications this calendar year as well as for the [facility] or hypertension measure.  Medication:Atorvastatin  80 mg and Lisinopril  20 mg  Last fill date: 12/22/23 for 30 day supply for both medications  Meds are being filled at [facility].  They fill in 7 day cycle packs and then do a once per month composite billing.   Geronimo Krabbe, PharmD, BCACP Clinical Pharmacist [phone]

## 2024-02-15 ENCOUNTER — Non-Acute Institutional Stay (SKILLED_NURSING_FACILITY): Payer: Self-pay | Admitting: Student

## 2024-02-15 ENCOUNTER — Encounter: Payer: Self-pay | Admitting: Student

## 2024-02-15 DIAGNOSIS — F03918 Unspecified dementia, unspecified severity, with other behavioral disturbance: Secondary | ICD-10-CM | POA: Diagnosis not present

## 2024-02-15 DIAGNOSIS — T17908S Unspecified foreign body in respiratory tract, part unspecified causing other injury, sequela: Secondary | ICD-10-CM | POA: Diagnosis not present

## 2024-02-15 DIAGNOSIS — J690 Pneumonitis due to inhalation of food and vomit: Secondary | ICD-10-CM | POA: Diagnosis not present

## 2024-02-15 DIAGNOSIS — Z8673 Personal history of transient ischemic attack (TIA), and cerebral infarction without residual deficits: Secondary | ICD-10-CM

## 2024-02-15 NOTE — Progress Notes (Unsigned)
 Location:  Other [facility].  Nursing Home Room Number: [facility] [address]A Place of Service:  SNF [phone]) Provider:  Valrie Gehrig, MD  Patient Care Team: Valrie Gehrig, MD as PCP - General (Family Medicine) Jerelene Monday, Deadra Everts, MD as Consulting Physician (Cardiology) Marilyne Shu, NP as Nurse Practitioner (Geriatric Medicine)  Extended Emergency Contact Information Primary Emergency Contact: Pitter,[name] Address: [address]          [place], [place] [address] [place] Mobile Phone: [phone] Relation: Daughter Secondary Emergency Contact: Carson Clara Address: [address]          [place], [place] [address] [place] Home Phone: [phone] Mobile Phone: [phone] Relation: Spouse  Code Status:  DNR Goals of care: Advanced Directive information    02/08/2024    1:19 PM  Advanced Directives  Does Patient Have a Medical Advance Directive? Yes  Type of Estate agent of [place];Out of facility DNR (pink MOST or yellow form);Living will  Does patient want to make changes to medical advance directive? No - Patient declined  Copy of Healthcare Power of Attorney in Chart? Yes - validated most recent copy scanned in chart (See row information)     Chief Complaint  Patient presents with   Goals of Care    Goals of Care.     HPI:  Pt is a 88 y.o. female seen today for Goals of Care.   Patient is doing well resting comforably in chair.   Discussed with patient's daughter and husband. Discussed concern for her decline with weight loss, increased pain, and continued dysphagia. Patient currently on pureed diet with full assistance for feeding. Discussed concern that continued weight loss should be prevented if possible. There is potential for improvement of nutrition with expansion of diet to minced and moist diet since she eats this diet better. Of note, patient was previously on this diet 1 year ago  and was downgraded to pureed diet. After much discussion, Fredrik Jensen and spouse agree with transition to minced and moist diet. Focus at this time is for quality of life over quantity of days.   Past Medical History:  Diagnosis Date   Cerebral infarction [facility])    CKD (chronic kidney disease), stage III (HCC)    Depression    DJD (degenerative joint disease)    Edema    High cholesterol    Hyperlipidemia, unspecified    Hypertension    Hypothyroidism    Lower GI bleed 01/31/2019   Last Assessment & Plan:  Formatting of this note might be different from the original. Noted several episodes here and at [facility]. Was on dual antiplatelets and still is but asa dose was lowered. She is doing well now and in good spirits today in her room in isolation given covid. No abd pain and eating well with normal bm's per staff.   Malaise    Morbid obesity (HCC)    Osteoarthritis    Parathyroid  abnormality (HCC)    Seizures (HCC)    a. following remote stroke.   Stroke [facility]) 2002, 05/2017, 01/26/18, 01/30/18   a. 1964-->residual right arm wkns.  Previously on coumadin - pt says for just a few yrs.   Subdural hematoma (HCC)    a. 10/2001 [facility] req temporal frontoparietal craniotomy following fall. ? whether or not pt on coumadin @ time.  Notes indicate yes but pt denies.   Thyroid  disease    TMJ (dislocation of temporomandibular joint)    Tuberculosis  a. ~ 1950   Urinary incontinence    Weight loss    Past Surgical History:  Procedure Laterality Date   ABDOMINAL HYSTERECTOMY     BACK SURGERY     BRAIN SURGERY     cataract surgery     2014   LEFT HEART CATH AND CORONARY ANGIOGRAPHY N/A 06/13/2017   Procedure: LEFT HEART CATH AND CORONARY ANGIOGRAPHY;  Surgeon: Wenona Hamilton, MD;  Location: [facility] INVASIVE CV LAB;  Service: Cardiovascular;  Laterality: N/A;   PARATHYROID  ADENOMA REMOVAL     TEE WITHOUT CARDIOVERSION N/A 01/31/2018   Procedure: TRANSESOPHAGEAL ECHOCARDIOGRAM (TEE);  Surgeon: Sammy Crisp, MD;  Location: [facility] ORS;  Service: Cardiovascular;  Laterality: N/A;   TEE WITHOUT CARDIOVERSION N/A 02/01/2018   Procedure: TRANSESOPHAGEAL ECHOCARDIOGRAM (TEE);  Surgeon: Devorah Fonder, MD;  Location: [facility] ORS;  Service: Cardiovascular;  Laterality: N/A;    No Known Allergies  Outpatient Encounter Medications as of 02/15/2024  Medication Sig   acetaminophen  (TYLENOL ) 325 MG tablet Take 650 mg by mouth 3 (three) times daily. Scheduled, see as needed listing   atorvastatin  (LIPITOR) 80 MG tablet Take 1 tablet (80 mg total) by mouth daily at 6 PM.   Citalopram  Hydrobromide 30 MG CAPS Take 30 mg by mouth daily.   clopidogrel  (PLAVIX ) 75 MG tablet Take 1 tablet (75 mg total) by mouth daily.   divalproex  (DEPAKOTE  SPRINKLE) 125 MG capsule Take 375 mg by mouth 2 (two) times daily.   hydrochlorothiazide  (MICROZIDE ) 12.5 MG capsule Take 12.5 mg by mouth daily.   Infant Care Products [facility]) OINT Apply 1 application  topically every 12 (twelve) hours as needed (apply to axilla, groin, and under breast).   latanoprost  (XALATAN ) 0.005 % ophthalmic solution Place 1 drop into both eyes at bedtime.    levothyroxine  (SYNTHROID ) 88 MCG tablet Take 88 mcg by mouth daily. At bedtime   lisinopril  (ZESTRIL ) 20 MG tablet Take 20 mg by mouth daily.   NYAMYC powder Apply 1 Application topically every 12 (twelve) hours as needed (for skin breakdown, apply under breast).   potassium chloride  SA (KLOR-CON  M) 20 MEQ tablet Take 20 mEq by mouth daily.   sodium fluoride (SODIUM FLUORIDE 5000 PLUS) 1.1 % CREA dental cream Place 1 Application onto teeth at bedtime.   tamsulosin  (FLOMAX ) 0.4 MG CAPS capsule Take 1 capsule (0.4 mg total) by mouth daily.   traMADol  (ULTRAM ) 50 MG tablet TAKE 1 TABLET BY MOUTH TWICE DAILY;TAKE 1 TABLET BY MOUTH EVERY 6 HOURS AS NEEDED FOR INCREASED PAIN   polyethylene glycol (MIRALAX  / GLYCOLAX ) 17 g packet Take 17 g by mouth every other day. (Patient not taking: Reported  on 02/15/2024)   No facility-administered encounter medications on file as of 02/15/2024.    Review of Systems  Immunization History  Administered Date(s) Administered   Influenza Split 07/18/2014, 07/23/2015   Influenza, High Dose Seasonal PF 06/14/2017, 06/15/2022   Influenza-Unspecified 05/25/2012, 05/31/2016, 06/01/2018, 06/21/2021, 06/22/2023   Moderna Covid-19 Vaccine Bivalent Booster 86yrs & up 01/26/2022, 07/09/2022   Moderna SARS-COV2 Booster Vaccination 07/11/2020, 01/16/2021   Moderna Sars-Covid-2 Vaccination 09/19/2019, 10/17/2019, 08/29/2023   PFIZER Comirnaty(Gray Top)Covid-19 Tri-Sucrose Vaccine 07/09/2022   PNEUMOCOCCAL CONJUGATE-20 02/07/2023   Pfizer Covid-19 Vaccine Bivalent Booster 51yrs & up 05/22/2021   Pneumococcal Polysaccharide-23 08/30/2004, 08/04/2017, 02/15/2019   Zoster Recombinant(Shingrix) 02/08/2023, 03/08/2023   Pertinent  Health Maintenance Due  Topic Date Due   INFLUENZA VACCINE  03/30/2024   DEXA SCAN  Discontinued  01/30/2019    2:00 PM 04/26/2022   10:00 AM 05/19/2022    9:12 AM 06/02/2022    2:12 PM 02/03/2023    3:02 PM  Fall Risk  Falls in the past year?   0  0  Was there an injury with Fall?   0    Fall Risk Category Calculator   0    Fall Risk Category (Retired)   Low     (RETIRED) Patient Fall Risk Level High fall risk  High fall risk  Moderate fall risk  High fall risk    Patient at Risk for Falls Due to   History of fall(s)    Fall risk Follow up   Falls evaluation completed        Data saved with a previous flowsheet row definition   Functional Status Survey:    Vitals:   02/15/24 1021  BP: 126/75  Pulse: 69  Resp: 17  Temp: 98.4 F (36.9 C)  SpO2: 95%  Weight: 141 lb 3.2 oz (64 kg)  Height: 5' 4 (1.626 m)   Body mass index is 24.24 kg/m. Physical Exam Constitutional:      Comments: Chronically ill appearing     Labs reviewed: Recent Labs    02/28/23 0000 03/31/23 0000 12/29/23 0000  NA 143 141 142  K  3.4* 3.8 4.4  CL 104 103 101  CO2 34* 35* 34*  BUN 14 19 16   CREATININE 0.6 0.6 0.6  CALCIUM  9.1 8.9 9.5   Recent Labs    12/29/23 0000  AST 16  ALT 10  ALKPHOS 52  ALBUMIN 3.6   Recent Labs    11/21/23 0000 12/29/23 0000  WBC 5.7 6.3  NEUTROABS  --  3,660.00  HGB 11.9* 12.5  HCT 37 38  PLT 93* 96*   Lab Results  Component Value Date   TSH 0.50 08/31/2022   Lab Results  Component Value Date   HGBA1C 5.6 01/27/2018   Lab Results  Component Value Date   CHOL 130 07/04/2023   HDL 47 07/04/2023   LDLCALC 69 07/04/2023   TRIG 68 07/04/2023   CHOLHDL 3.7 01/28/2018    Significant Diagnostic Results in last 30 days:  No results found.  Assessment/Plan Chronic pulmonary aspiration, sequela  Dementia with behavioral disturbance (HCC)  History of stroke Plan to expand diet to minced and moist from pureed diet with acknowledgement this will likely contribute to continued aspiration events. Goal is to maintain quality of life.   Family/ staff Communication: nursing  Labs/tests ordered:  none  I spent greater than 15  minutes for the care of this patient in face to face time, chart review, clinical documentation, patient education. I spent an additional 16 minutes discussing goals of care and advanced care planning.

## 2024-02-16 ENCOUNTER — Encounter: Payer: Self-pay | Admitting: Student

## 2024-02-16 DIAGNOSIS — F01C3 Vascular dementia, severe, with mood disturbance: Secondary | ICD-10-CM | POA: Diagnosis not present

## 2024-02-16 DIAGNOSIS — F01C11 Vascular dementia, severe, with agitation: Secondary | ICD-10-CM | POA: Diagnosis not present

## 2024-02-16 DIAGNOSIS — F01C4 Vascular dementia, severe, with anxiety: Secondary | ICD-10-CM | POA: Diagnosis not present

## 2024-02-16 DIAGNOSIS — R4589 Other symptoms and signs involving emotional state: Secondary | ICD-10-CM | POA: Diagnosis not present

## 2024-02-16 DIAGNOSIS — F329 Major depressive disorder, single episode, unspecified: Secondary | ICD-10-CM | POA: Diagnosis not present

## 2024-02-23 ENCOUNTER — Encounter: Payer: Self-pay | Admitting: Nurse Practitioner

## 2024-02-23 ENCOUNTER — Non-Acute Institutional Stay (SKILLED_NURSING_FACILITY): Payer: Self-pay | Admitting: Nurse Practitioner

## 2024-02-23 DIAGNOSIS — Z0000 Encounter for general adult medical examination without abnormal findings: Secondary | ICD-10-CM

## 2024-02-23 NOTE — Progress Notes (Signed)
 Subjective:   Shawna [name] is a 88 y.o. female who presents for Medicare Annual (Subsequent) preventive examination.  Visit Complete: In person [facility]    Cardiac Risk Factors include: advanced age (>70men, >64 women);hypertension;dyslipidemia;sedentary lifestyle     Objective:    Today's Vitals   02/23/24 0829  BP: 110/75  Pulse: 81  Resp: 17  Temp: 98.4 F (36.9 C)  SpO2: 95%  Weight: 141 lb 3.2 oz (64 kg)  Height: 5' 4 (1.626 m)   Body mass index is 24.24 kg/m.     02/08/2024    1:19 PM 11/16/2023   10:54 AM 09/22/2023    8:20 AM 07/12/2023    2:24 PM 06/06/2023    2:29 PM 04/26/2023   11:28 AM 04/18/2023   11:16 AM  Advanced Directives  Does Patient Have a Medical Advance Directive? Yes Yes Yes No Yes Yes Yes  Type of Estate agent of [place];Out of facility DNR (pink MOST or yellow form);Living will Healthcare Power of [place];Living will;Out of facility DNR (pink MOST or yellow form) Healthcare Power of [place];Out of facility DNR (pink MOST or yellow form);Living will Healthcare Power of [place];Living will Healthcare Power of [place];Out of facility DNR (pink MOST or yellow form);Living will Healthcare Power of [place];Out of facility DNR (pink MOST or yellow form);Living will Healthcare Power of [place];Out of facility DNR (pink MOST or yellow form);Living will  Does patient want to make changes to medical advance directive? No - Patient declined No - Patient declined No - Patient declined  No - Patient declined No - Patient declined No - Patient declined  Copy of Healthcare Power of Attorney in Chart? Yes - validated most recent copy scanned in chart (See row information) Yes - validated most recent copy scanned in chart (See row information) Yes - validated most recent copy scanned in chart (See row information)  Yes - validated most recent copy scanned in chart (See row information) Yes - validated most recent copy scanned in chart (See  row information) Yes - validated most recent copy scanned in chart (See row information)    Current Medications (verified) Outpatient Encounter Medications as of 02/23/2024  Medication Sig   acetaminophen  (TYLENOL ) 325 MG tablet Take 650 mg by mouth 3 (three) times daily. Scheduled, see as needed listing   atorvastatin  (LIPITOR) 80 MG tablet Take 1 tablet (80 mg total) by mouth daily at 6 PM.   Citalopram  Hydrobromide 30 MG CAPS Take 30 mg by mouth daily.   clopidogrel  (PLAVIX ) 75 MG tablet Take 1 tablet (75 mg total) by mouth daily.   divalproex  (DEPAKOTE  SPRINKLE) 125 MG capsule Take 375 mg by mouth 2 (two) times daily.   hydrochlorothiazide  (MICROZIDE ) 12.5 MG capsule Take 12.5 mg by mouth daily.   Infant Care Products [facility]) OINT Apply 1 application  topically every 12 (twelve) hours as needed (apply to axilla, groin, and under breast).   latanoprost  (XALATAN ) 0.005 % ophthalmic solution Place 1 drop into both eyes at bedtime.    levothyroxine  (SYNTHROID ) 88 MCG tablet Take 88 mcg by mouth daily. At bedtime   lisinopril  (ZESTRIL ) 20 MG tablet Take 20 mg by mouth daily.   NYAMYC powder Apply 1 Application topically every 12 (twelve) hours as needed (for skin breakdown, apply under breast).   potassium chloride  SA (KLOR-CON  M) 20 MEQ tablet Take 20 mEq by mouth daily.   sodium fluoride (SODIUM FLUORIDE 5000 PLUS) 1.1 % CREA dental cream Place 1 Application onto teeth  at bedtime.   tamsulosin  (FLOMAX ) 0.4 MG CAPS capsule Take 1 capsule (0.4 mg total) by mouth daily.   traMADol  (ULTRAM ) 50 MG tablet TAKE 1 TABLET BY MOUTH TWICE DAILY;TAKE 1 TABLET BY MOUTH EVERY 6 HOURS AS NEEDED FOR INCREASED PAIN   polyethylene glycol (MIRALAX  / GLYCOLAX ) 17 g packet Take 17 g by mouth every other day. (Patient not taking: Reported on 02/23/2024)   No facility-administered encounter medications on file as of 02/23/2024.    Allergies (verified) Patient has no known allergies.   History: Past Medical  History:  Diagnosis Date   Cerebral infarction (HCC)    CKD (chronic kidney disease), stage III (HCC)    Depression    DJD (degenerative joint disease)    Edema    High cholesterol    Hyperlipidemia, unspecified    Hypertension    Hypothyroidism    Lower GI bleed 01/31/2019   Last Assessment & Plan:  Formatting of this note might be different from the original. Noted several episodes here and at [facility]. Was on dual antiplatelets and still is but asa dose was lowered. She is doing well now and in good spirits today in her room in isolation given covid. No abd pain and eating well with normal bm's per staff.   Malaise    Morbid obesity (HCC)    Osteoarthritis    Parathyroid  abnormality (HCC)    Seizures (HCC)    a. following remote stroke.   Stroke [facility]) 2002, 05/2017, 01/26/18, 01/30/18   a. 1964-->residual right arm wkns.  Previously on coumadin - pt says for just a few yrs.   Subdural hematoma (HCC)    a. 10/2001 [facility] req temporal frontoparietal craniotomy following fall. ? whether or not pt on coumadin @ time.  Notes indicate yes but pt denies.   Thyroid  disease    TMJ (dislocation of temporomandibular joint)    Tuberculosis    a. ~ 1950   Urinary incontinence    Weight loss    Past Surgical History:  Procedure Laterality Date   ABDOMINAL HYSTERECTOMY     BACK SURGERY     BRAIN SURGERY     cataract surgery     2014   LEFT HEART CATH AND CORONARY ANGIOGRAPHY N/A 06/13/2017   Procedure: LEFT HEART CATH AND CORONARY ANGIOGRAPHY;  Surgeon: Darron Deatrice LABOR, MD;  Location: [facility] INVASIVE CV LAB;  Service: Cardiovascular;  Laterality: N/A;   PARATHYROID  ADENOMA REMOVAL     TEE WITHOUT CARDIOVERSION N/A 01/31/2018   Procedure: TRANSESOPHAGEAL ECHOCARDIOGRAM (TEE);  Surgeon: Mady Bruckner, MD;  Location: [facility] ORS;  Service: Cardiovascular;  Laterality: N/A;   TEE WITHOUT CARDIOVERSION N/A 02/01/2018   Procedure: TRANSESOPHAGEAL ECHOCARDIOGRAM (TEE);  Surgeon: Perla Evalene PARAS, MD;   Location: [facility] ORS;  Service: Cardiovascular;  Laterality: N/A;   Family History  Problem Relation Age of Onset   Heart failure Mother        died @ 23   Heart attack Father        died @ 23   Stroke Father    Hypertension Sister    Diabetes Sister    Hypertension Brother    Diabetes Brother    Breast cancer Neg Hx    Social History   Socioeconomic History   Marital status: Married    Spouse name: Lynwood   Number of children: 2   Years of education: BS   Highest education level: Not on file  Occupational History   Not on file  Tobacco Use   Smoking status: Never   Smokeless tobacco: Never  Vaping Use   Vaping status: Never Used  Substance and Sexual Activity   Alcohol use: No   Drug use: No   Sexual activity: Not on file  Other Topics Concern   Not on file  Social History Narrative   [place]  Active around the house.  Does not routinely exercise or drive.  Does own grocery shopping.  10/18/2018 residing at [facility]	[place]   Social Drivers of Health   Financial Resource Strain: Low Risk  (01/29/2019)   Overall Financial Resource Strain (CARDIA)    Difficulty of Paying Living Expenses: Not hard at all  Food Insecurity: Unknown (01/29/2019)   Hunger Vital Sign    Worried About Running Out of Food in the Last Year: Patient declined    Ran Out of Food in the Last Year: Patient declined  Transportation Needs: Unknown (01/29/2019)   PRAPARE - Administrator, Civil Service (Medical): Patient declined    Lack of Transportation (Non-Medical): Patient declined  Physical Activity: Unknown (01/29/2019)   Exercise Vital Sign    Days of Exercise per Week: Patient declined    Minutes of Exercise per Session: Patient declined  Stress: Not on file  Social Connections: Unknown (01/29/2019)   Social Connection and Isolation Panel    Frequency of Communication with Friends and Family: Patient declined    Frequency of Social  Gatherings with Friends and Family: Patient declined    Attends Religious Services: Patient declined    Database administrator or Organizations: Patient declined    Attends Engineer, structural: Patient declined    Marital Status: Patient declined    Tobacco Counseling Counseling given: Not Answered   Clinical Intake:  Pre-visit preparation completed: Yes  Pain : No/denies pain     BMI - recorded: 24.24 Nutritional Status: BMI of 19-24  Normal Nutritional Risks: Unintentional weight loss, Other (Comment) (dementia)  How often do you need to have someone help you when you read instructions, pamphlets, or other written materials from your doctor or pharmacy?: 5 - Always         Activities of Daily Living    02/23/2024   10:05 AM  In your present state of health, do you have any difficulty performing the following activities:  Hearing? 0  Vision? 0  Difficulty concentrating or making decisions? 1  Walking or climbing stairs? 1  Dressing or bathing? 1  Doing errands, shopping? 1  Preparing Food and eating ? Y  Using the Toilet? Y  In the past six months, have you accidently leaked urine? Y  Do you have problems with loss of bowel control? Y  Managing your Medications? Y  Managing your Finances? Y  Housekeeping or managing your Housekeeping? Y    Patient Care Team: Abdul Fine, MD as PCP - General (Family Medicine) Perla Evalene PARAS, MD as Consulting Physician (Cardiology) Landy Barnie RAMAN, NP as Nurse Practitioner (Geriatric Medicine)  Indicate any recent Medical Services you may have received from other than [facility] providers in the past year (date may be approximate).     Assessment:   This is a routine wellness examination for Moni.  Hearing/Vision screen No results found.   Goals Addressed   None    Depression Screen    02/23/2024   10:07 AM 02/03/2023    3:02 PM  PHQ 2/9 Scores  Exception Documentation  Other- indicate reason in  comment box Other- indicate reason in comment box  Not completed dementia     Fall Risk    02/23/2024   10:06 AM 02/03/2023    3:02 PM 05/19/2022    9:12 AM  Fall Risk   Falls in the past year? 0 0 0  Number falls in past yr: 0  0  Injury with Fall?   0  Risk for fall due to : Impaired mobility  History of fall(s)  Follow up Falls evaluation completed  Falls evaluation completed      Data saved with a previous flowsheet row definition    MEDICARE RISK AT HOME: Medicare Risk at Home Any stairs in or around the home?: No Home free of loose throw rugs in walkways, pet beds, electrical cords, etc?: Yes Adequate lighting in your home to reduce risk of falls?: Yes Life alert?: No Use of a cane, walker or w/c?: Yes Grab bars in the bathroom?: Yes Shower chair or bench in shower?: Yes Elevated toilet seat or a handicapped toilet?: Yes  TIMED UP AND GO:  Was the test performed?  No    Cognitive Function:        Immunizations Immunization History  Administered Date(s) Administered   Influenza Split 07/18/2014, 07/23/2015   Influenza, High Dose Seasonal PF 06/14/2017, 06/15/2022   Influenza-Unspecified 05/25/2012, 05/31/2016, 06/01/2018, 06/21/2021, 06/22/2023   Moderna Covid-19 Vaccine Bivalent Booster 42yrs & up 01/26/2022, 07/09/2022   Moderna SARS-COV2 Booster Vaccination 07/11/2020, 01/16/2021   Moderna Sars-Covid-2 Vaccination 09/19/2019, 10/17/2019, 08/29/2023   PFIZER Comirnaty(Gray Top)Covid-19 Tri-Sucrose Vaccine 07/09/2022   PNEUMOCOCCAL CONJUGATE-20 02/07/2023   Pfizer Covid-19 Vaccine Bivalent Booster 49yrs & up 05/22/2021   Pneumococcal Polysaccharide-23 08/30/2004, 08/04/2017, 02/15/2019   Zoster Recombinant(Shingrix) 02/08/2023, 03/08/2023    TDAP status: Due, Education has been provided regarding the importance of this vaccine. Advised may receive this vaccine at local pharmacy or Health Dept. Aware to provide a copy of the vaccination record if obtained from  local pharmacy or Health Dept. Verbalized acceptance and understanding.  Flu Vaccine status: Up to date  Pneumococcal vaccine status: Up to date  Covid-19 vaccine status: Information provided on how to obtain vaccines.   Qualifies for Shingles Vaccine? Yes   Zostavax completed No   Shingrix Completed?: Yes  Screening Tests Health Maintenance  Topic Date Due   COVID-19 Vaccine (9 - 2024-25 season) 10/24/2023   DTaP/Tdap/Td (1 - Tdap) 11/15/2024 (Originally 08/02/1955)   INFLUENZA VACCINE  03/30/2024   Medicare Annual Wellness (AWV)  02/22/2025   Pneumococcal Vaccine: 50+ Years  Completed   Zoster Vaccines- Shingrix  Completed   Hepatitis B Vaccines  Aged Out   HPV VACCINES  Aged Out   Meningococcal B Vaccine  Aged Out   DEXA SCAN  Discontinued    Health Maintenance  Health Maintenance Due  Topic Date Due   COVID-19 Vaccine (9 - 2024-25 season) 10/24/2023    Colorectal cancer screening: No longer required.   Mammogram status: No longer required due to aged out.   Lung Cancer Screening: (Low Dose CT Chest recommended if Age 25-80 years, 20 pack-year currently smoking OR have quit w/in 15years.) does not qualify.   Lung Cancer Screening Referral: na  Additional Screening:  Hepatitis C Screening: does not qualify  Vision Screening: Recommended annual ophthalmology exams for early detection of glaucoma and other disorders of the eye. Is the patient up to date with their annual eye exam?  No  unable to complete due  to dementia  Dental Screening: Recommended annual dental exams for proper oral hygiene   Community Resource Referral / Chronic Care Management: CRR required this visit?  No   CCM required this visit?  No     Plan:     I have personally reviewed and noted the following in the patient's chart:   Medical and social history Use of alcohol, tobacco or illicit drugs  Current medications and supplements including opioid prescriptions. Patient is  currently taking opioid prescriptions. Information provided to patient regarding non-opioid alternatives. Patient advised to discuss non-opioid treatment plan with their provider. Functional ability and status Nutritional status Physical activity Advanced directives List of other physicians Hospitalizations, surgeries, and ER visits in previous 12 months Vitals Screenings to include cognitive, depression, and falls Referrals and appointments  In addition, I have reviewed and discussed with patient certain preventive protocols, quality metrics, and best practice recommendations. A written personalized care plan for preventive services as well as general preventive health recommendations were provided to patient.     Harlene MARLA An, NP   02/23/2024

## 2024-03-14 ENCOUNTER — Non-Acute Institutional Stay (SKILLED_NURSING_FACILITY): Payer: Self-pay | Admitting: Student

## 2024-03-14 ENCOUNTER — Encounter: Payer: Self-pay | Admitting: Student

## 2024-03-14 DIAGNOSIS — F329 Major depressive disorder, single episode, unspecified: Secondary | ICD-10-CM | POA: Diagnosis not present

## 2024-03-14 DIAGNOSIS — R1319 Other dysphagia: Secondary | ICD-10-CM | POA: Diagnosis not present

## 2024-03-14 DIAGNOSIS — I5032 Chronic diastolic (congestive) heart failure: Secondary | ICD-10-CM | POA: Diagnosis not present

## 2024-03-14 DIAGNOSIS — F01C3 Vascular dementia, severe, with mood disturbance: Secondary | ICD-10-CM | POA: Diagnosis not present

## 2024-03-14 DIAGNOSIS — F01C4 Vascular dementia, severe, with anxiety: Secondary | ICD-10-CM | POA: Diagnosis not present

## 2024-03-14 DIAGNOSIS — F01C11 Vascular dementia, severe, with agitation: Secondary | ICD-10-CM | POA: Diagnosis not present

## 2024-03-14 DIAGNOSIS — F321 Major depressive disorder, single episode, moderate: Secondary | ICD-10-CM | POA: Diagnosis not present

## 2024-03-14 DIAGNOSIS — I110 Hypertensive heart disease with heart failure: Secondary | ICD-10-CM | POA: Diagnosis not present

## 2024-03-14 DIAGNOSIS — G8929 Other chronic pain: Secondary | ICD-10-CM | POA: Diagnosis not present

## 2024-03-14 DIAGNOSIS — R4589 Other symptoms and signs involving emotional state: Secondary | ICD-10-CM | POA: Diagnosis not present

## 2024-03-14 DIAGNOSIS — F0150 Vascular dementia without behavioral disturbance: Secondary | ICD-10-CM | POA: Diagnosis not present

## 2024-03-14 NOTE — Progress Notes (Signed)
 Location:  Other [facility].  Nursing Home Room Number: [facility] Place of Service:  SNF [phone]) Provider:  Abdul Fine, MD  Patient Care Team: Abdul Fine, MD as PCP - General (Family Medicine) Perla, Evalene PARAS, MD as Consulting Physician (Cardiology) Landy Barnie RAMAN, NP as Nurse Practitioner (Geriatric Medicine)  Extended Emergency Contact Information Primary Emergency Contact: [name],[name] Address: [address]          [place], [place] [address] [place] Mobile Phone: [phone] Relation: Daughter Secondary Emergency Contact: [name] ORN Address: [address]          [place], [place] [address] [place] Home Phone: [phone] Mobile Phone: [phone] Relation: Spouse  Code Status:  DNR Goals of care: Advanced Directive information    02/08/2024    1:19 PM  Advanced Directives  Does Patient Have a Medical Advance Directive? Yes  Type of Estate agent of [place];Out of facility DNR (pink MOST or yellow form);Living will  Does patient want to make changes to medical advance directive? No - Patient declined  Copy of Healthcare Power of Attorney in Chart? Yes - validated most recent copy scanned in chart (See row information)     Chief Complaint  Patient presents with   Medical Management of Chronic Issues    Medical Management of Chronic Issues.     HPI:  Pt is a 88 y.o. female seen today for medical management of chronic diseases.    Patient was changed to liberalized diet, however, continued to pack food in her mouth. Diet downgraded. Weight continues to fluctuate. She also shows periodic signs of pain and distress secondary to the pain. Pain is chronic. NO new mechanism of injury.   Past Medical History:  Diagnosis Date   Cerebral infarction [facility])    CKD (chronic kidney disease), stage III (HCC)    Depression    DJD (degenerative joint disease)    Edema    High  cholesterol    Hyperlipidemia, unspecified    Hypertension    Hypothyroidism    Lower GI bleed 01/31/2019   Last Assessment & Plan:  Formatting of this note might be different from the original. Noted several episodes here and at [facility]. Was on dual antiplatelets and still is but asa dose was lowered. She is doing well now and in good spirits today in her room in isolation given covid. No abd pain and eating well with normal bm's per staff.   Malaise    Morbid obesity (HCC)    Osteoarthritis    Parathyroid  abnormality (HCC)    Seizures (HCC)    a. following remote stroke.   Stroke [facility]) 2002, 05/2017, 01/26/18, 01/30/18   a. 1964-->residual right arm wkns.  Previously on coumadin - pt says for just a few yrs.   Subdural hematoma (HCC)    a. 10/2001 [facility] req temporal frontoparietal craniotomy following fall. ? whether or not pt on coumadin @ time.  Notes indicate yes but pt denies.   Thyroid  disease    TMJ (dislocation of temporomandibular joint)    Tuberculosis    a. ~ 1950   Urinary incontinence    Weight loss    Past Surgical History:  Procedure Laterality Date   ABDOMINAL HYSTERECTOMY     BACK SURGERY     BRAIN SURGERY     cataract surgery     2014   LEFT HEART CATH AND CORONARY ANGIOGRAPHY N/A 06/13/2017   Procedure: LEFT HEART  CATH AND CORONARY ANGIOGRAPHY;  Surgeon: Darron Deatrice LABOR, MD;  Location: [facility] INVASIVE CV LAB;  Service: Cardiovascular;  Laterality: N/A;   PARATHYROID  ADENOMA REMOVAL     TEE WITHOUT CARDIOVERSION N/A 01/31/2018   Procedure: TRANSESOPHAGEAL ECHOCARDIOGRAM (TEE);  Surgeon: Mady Bruckner, MD;  Location: [facility] ORS;  Service: Cardiovascular;  Laterality: N/A;   TEE WITHOUT CARDIOVERSION N/A 02/01/2018   Procedure: TRANSESOPHAGEAL ECHOCARDIOGRAM (TEE);  Surgeon: Perla Evalene PARAS, MD;  Location: [facility] ORS;  Service: Cardiovascular;  Laterality: N/A;    No Known Allergies  Outpatient Encounter Medications as of 03/14/2024  Medication Sig   acetaminophen   (TYLENOL ) 325 MG tablet Take 650 mg by mouth 3 (three) times daily. Scheduled, see as needed listing   atorvastatin  (LIPITOR) 80 MG tablet Take 1 tablet (80 mg total) by mouth daily at 6 PM.   Citalopram  Hydrobromide 30 MG CAPS Take 30 mg by mouth daily.   clopidogrel  (PLAVIX ) 75 MG tablet Take 1 tablet (75 mg total) by mouth daily.   divalproex  (DEPAKOTE  SPRINKLE) 125 MG capsule Take 375 mg by mouth 2 (two) times daily.   hydrochlorothiazide  (MICROZIDE ) 12.5 MG capsule Take 12.5 mg by mouth daily.   Infant Care Products [facility]) OINT Apply 1 application  topically every 12 (twelve) hours as needed (apply to axilla, groin, and under breast).   latanoprost  (XALATAN ) 0.005 % ophthalmic solution Place 1 drop into both eyes at bedtime.    levothyroxine  (SYNTHROID ) 88 MCG tablet Take 88 mcg by mouth daily. At bedtime   lisinopril  (ZESTRIL ) 20 MG tablet Take 20 mg by mouth daily.   NYAMYC powder Apply 1 Application topically every 12 (twelve) hours as needed (for skin breakdown, apply under breast).   potassium chloride  SA (KLOR-CON  M) 20 MEQ tablet Take 20 mEq by mouth daily.   sodium fluoride (SODIUM FLUORIDE 5000 PLUS) 1.1 % CREA dental cream Place 1 Application onto teeth at bedtime.   tamsulosin  (FLOMAX ) 0.4 MG CAPS capsule Take 1 capsule (0.4 mg total) by mouth daily.   traMADol  (ULTRAM ) 50 MG tablet TAKE 1 TABLET BY MOUTH TWICE DAILY;TAKE 1 TABLET BY MOUTH EVERY 6 HOURS AS NEEDED FOR INCREASED PAIN   polyethylene glycol (MIRALAX  / GLYCOLAX ) 17 g packet Take 17 g by mouth every other day. (Patient not taking: Reported on 03/14/2024)   No facility-administered encounter medications on file as of 03/14/2024.    Review of Systems  Immunization History  Administered Date(s) Administered   Influenza Split 07/18/2014, 07/23/2015   Influenza, High Dose Seasonal PF 06/14/2017, 06/15/2022   Influenza-Unspecified 05/25/2012, 05/31/2016, 06/01/2018, 06/21/2021, 06/22/2023   Moderna Covid-19 Vaccine  Bivalent Booster 14yrs & up 01/26/2022, 07/09/2022   Moderna SARS-COV2 Booster Vaccination 07/11/2020, 01/16/2021   Moderna Sars-Covid-2 Vaccination 09/19/2019, 10/17/2019, 08/29/2023   PFIZER Comirnaty(Gray Top)Covid-19 Tri-Sucrose Vaccine 07/09/2022   PNEUMOCOCCAL CONJUGATE-20 02/07/2023   Pfizer Covid-19 Vaccine Bivalent Booster 59yrs & up 05/22/2021   Pneumococcal Polysaccharide-23 08/30/2004, 08/04/2017, 02/15/2019   Zoster Recombinant(Shingrix) 02/08/2023, 03/08/2023   Pertinent  Health Maintenance Due  Topic Date Due   INFLUENZA VACCINE  03/30/2024   DEXA SCAN  Discontinued      04/26/2022   10:00 AM 05/19/2022    9:12 AM 06/02/2022    2:12 PM 02/03/2023    3:02 PM 02/23/2024   10:06 AM  Fall Risk  Falls in the past year?  0  0 0  Was there an injury with Fall?  0     Fall Risk Category Calculator  0  Fall Risk Category (Retired)  Low      (RETIRED) Patient Fall Risk Level High fall risk  Moderate fall risk  High fall risk     Patient at Risk for Falls Due to  History of fall(s)   Impaired mobility  Fall risk Follow up  Falls evaluation completed    Falls evaluation completed     Data saved with a previous flowsheet row definition   Functional Status Survey:    Vitals:   03/14/24 1009 03/14/24 1018  BP: (!) 151/71 118/66  Pulse: 75   Resp: 17   Temp: 97.6 F (36.4 C)   SpO2: 96%   Weight: 144 lb (65.3 kg)   Height: 5' 4 (1.626 m)    Body mass index is 24.72 kg/m. Physical Exam  Labs reviewed: Recent Labs    03/31/23 0000 12/29/23 0000  NA 141 142  K 3.8 4.4  CL 103 101  CO2 35* 34*  BUN 19 16  CREATININE 0.6 0.6  CALCIUM  8.9 9.5   Recent Labs    12/29/23 0000  AST 16  ALT 10  ALKPHOS 52  ALBUMIN 3.6   Recent Labs    11/21/23 0000 12/29/23 0000  WBC 5.7 6.3  NEUTROABS  --  3,660.00  HGB 11.9* 12.5  HCT 37 38  PLT 93* 96*   Lab Results  Component Value Date   TSH 0.50 08/31/2022   Lab Results  Component Value Date   HGBA1C 5.6  01/27/2018   Lab Results  Component Value Date   CHOL 130 07/04/2023   HDL 47 07/04/2023   LDLCALC 69 07/04/2023   TRIG 68 07/04/2023   CHOLHDL 3.7 01/28/2018    Significant Diagnostic Results in last 30 days:  No results found.  Assessment/Plan Hypertensive heart disease with chronic diastolic congestive heart failure (HCC)  Depression, major, single episode, moderate (HCC)  Dementia, multiinfarct, without behavioral disturbance (HCC)  Other chronic pain  Other dysphagia Patient with hx of stroke leading to changes in memory and mobility. Appears euvolemic on exam without signs of decompensation of CHF at this time. Continues to have chronic pain and a low mood with periodic tearfulness due to pain. Chronic tramadol  use for management. She is also on Citalopram  and depakote  for mood. BP well=controlled with lisinopril . Continue statin for history of stroke. Thyroid  well-conrtolled on current regimen.   Family/ staff Communication: nursing  Labs/tests ordered:  none

## 2024-03-18 ENCOUNTER — Encounter: Payer: Self-pay | Admitting: Student

## 2024-03-30 DIAGNOSIS — G40409 Other generalized epilepsy and epileptic syndromes, not intractable, without status epilepticus: Secondary | ICD-10-CM | POA: Diagnosis not present

## 2024-03-30 LAB — CBC AND DIFFERENTIAL
HCT: 36 (ref 36–46)
Hemoglobin: 11.7 — AB (ref 12.0–16.0)
Neutrophils Absolute: 2534
Platelets: 93 K/uL — AB (ref 150–400)
WBC: 4.8

## 2024-03-30 LAB — HEPATIC FUNCTION PANEL
ALT: 14 U/L (ref 7–35)
AST: 18 (ref 13–35)
Alkaline Phosphatase: 43 (ref 25–125)
Bilirubin, Total: 0.7

## 2024-03-30 LAB — COMPREHENSIVE METABOLIC PANEL WITH GFR
Albumin: 3.4 — AB (ref 3.5–5.0)
Globulin: 2.1

## 2024-03-30 LAB — CBC: RBC: 3.59 — AB (ref 3.87–5.11)

## 2024-04-18 DIAGNOSIS — R4589 Other symptoms and signs involving emotional state: Secondary | ICD-10-CM | POA: Diagnosis not present

## 2024-04-18 DIAGNOSIS — F329 Major depressive disorder, single episode, unspecified: Secondary | ICD-10-CM | POA: Diagnosis not present

## 2024-04-18 DIAGNOSIS — F01C4 Vascular dementia, severe, with anxiety: Secondary | ICD-10-CM | POA: Diagnosis not present

## 2024-04-18 DIAGNOSIS — F01C11 Vascular dementia, severe, with agitation: Secondary | ICD-10-CM | POA: Diagnosis not present

## 2024-04-18 DIAGNOSIS — F01C3 Vascular dementia, severe, with mood disturbance: Secondary | ICD-10-CM | POA: Diagnosis not present

## 2024-04-19 DIAGNOSIS — I10 Essential (primary) hypertension: Secondary | ICD-10-CM | POA: Diagnosis not present

## 2024-04-19 LAB — CBC AND DIFFERENTIAL
HCT: 40 (ref 36–46)
Hemoglobin: 13.2 (ref 12.0–16.0)
Neutrophils Absolute: 4175
Platelets: 101 K/uL — AB (ref 150–400)
WBC: 7.1

## 2024-04-19 LAB — CBC: RBC: 3.98 (ref 3.87–5.11)

## 2024-04-20 DIAGNOSIS — B351 Tinea unguium: Secondary | ICD-10-CM | POA: Diagnosis not present

## 2024-04-20 DIAGNOSIS — I7091 Generalized atherosclerosis: Secondary | ICD-10-CM | POA: Diagnosis not present

## 2024-04-20 DIAGNOSIS — M2041 Other hammer toe(s) (acquired), right foot: Secondary | ICD-10-CM | POA: Diagnosis not present

## 2024-04-20 DIAGNOSIS — M2042 Other hammer toe(s) (acquired), left foot: Secondary | ICD-10-CM | POA: Diagnosis not present

## 2024-04-26 ENCOUNTER — Non-Acute Institutional Stay (SKILLED_NURSING_FACILITY): Payer: Self-pay | Admitting: Nurse Practitioner

## 2024-04-26 ENCOUNTER — Encounter: Payer: Self-pay | Admitting: Nurse Practitioner

## 2024-04-26 DIAGNOSIS — E785 Hyperlipidemia, unspecified: Secondary | ICD-10-CM | POA: Diagnosis not present

## 2024-04-26 DIAGNOSIS — R1319 Other dysphagia: Secondary | ICD-10-CM | POA: Insufficient documentation

## 2024-04-26 DIAGNOSIS — E034 Atrophy of thyroid (acquired): Secondary | ICD-10-CM | POA: Diagnosis not present

## 2024-04-26 DIAGNOSIS — F321 Major depressive disorder, single episode, moderate: Secondary | ICD-10-CM

## 2024-04-26 DIAGNOSIS — F01518 Vascular dementia, unspecified severity, with other behavioral disturbance: Secondary | ICD-10-CM | POA: Diagnosis not present

## 2024-04-26 DIAGNOSIS — G8929 Other chronic pain: Secondary | ICD-10-CM

## 2024-04-26 DIAGNOSIS — I5032 Chronic diastolic (congestive) heart failure: Secondary | ICD-10-CM | POA: Diagnosis not present

## 2024-04-26 DIAGNOSIS — I110 Hypertensive heart disease with heart failure: Secondary | ICD-10-CM

## 2024-04-26 NOTE — Assessment & Plan Note (Signed)
 Stable, no acute changes in cognitive or functional status, continue supportive care.

## 2024-04-26 NOTE — Progress Notes (Signed)
 Location:  Other [facility].  Nursing Home Room Number: [facility] 407A Place of Service:  SNF [phone]) Harlene An, NP  PCP: Abdul Fine, MD  Patient Care Team: Abdul Fine, MD as PCP - General (Family Medicine) Perla, Evalene PARAS, MD as Consulting Physician (Cardiology) Landy Barnie RAMAN, NP as Nurse Practitioner (Geriatric Medicine)  Extended Emergency Contact Information Primary Emergency Contact: Kuenzi,[name] Address: [address]          [place], [place] [address] [place] Mobile Phone: [phone] Relation: Daughter Secondary Emergency Contact: [name] ORN Address: [address]          [place], [place] [address] [place] Home Phone: [phone] Mobile Phone: [phone] Relation: Spouse  Goals of care: Advanced Directive information    04/26/2024   11:46 AM  Advanced Directives  Does Patient Have a Medical Advance Directive? Yes  Type of Estate agent of [place];Living will;Out of facility DNR (pink MOST or yellow form)  Does patient want to make changes to medical advance directive? No - Patient declined  Copy of Healthcare Power of Attorney in Chart? Yes - validated most recent copy scanned in chart (See row information)     Chief Complaint  Patient presents with   Medical Management of Chronic Issues    Medical Management of Chronic Issues.     HPI:  Pt is a 88 y.o. female seen today for medical management of chronic disease. Pt with dementia, hx of stroke, chronic pain, htn, CKD.  She has been doing well. No increase in behaviors Pain is controlled on tramadol .  She requires assist with all ADLs Staff has no concerns at this time.    Past Medical History:  Diagnosis Date   Cerebral infarction [facility])    CKD (chronic kidney disease), stage III (HCC)    Depression    DJD (degenerative joint disease)    Edema    High cholesterol    Hyperlipidemia, unspecified     Hypertension    Hypothyroidism    Lower GI bleed 01/31/2019   Last Assessment & Plan:  Formatting of this note might be different from the original. Noted several episodes here and at [facility]. Was on dual antiplatelets and still is but asa dose was lowered. She is doing well now and in good spirits today in her room in isolation given covid. No abd pain and eating well with normal bm's per staff.   Malaise    Morbid obesity (HCC)    Osteoarthritis    Parathyroid  abnormality (HCC)    Seizures (HCC)    a. following remote stroke.   Stroke [facility]) 2002, 05/2017, 01/26/18, 01/30/18   a. 1964-->residual right arm wkns.  Previously on coumadin - pt says for just a few yrs.   Subdural hematoma (HCC)    a. 10/2001 [facility] req temporal frontoparietal craniotomy following fall. ? whether or not pt on coumadin @ time.  Notes indicate yes but pt denies.   Thyroid  disease    TMJ (dislocation of temporomandibular joint)    Tuberculosis    a. ~ 1950   Urinary incontinence    Weight loss    Past Surgical History:  Procedure Laterality Date   ABDOMINAL HYSTERECTOMY     BACK SURGERY     BRAIN SURGERY     cataract surgery     2014   LEFT HEART CATH AND CORONARY ANGIOGRAPHY N/A 06/13/2017   Procedure: LEFT HEART CATH AND CORONARY ANGIOGRAPHY;  Surgeon:  Darron Deatrice LABOR, MD;  Location: [facility] INVASIVE CV LAB;  Service: Cardiovascular;  Laterality: N/A;   PARATHYROID  ADENOMA REMOVAL     TEE WITHOUT CARDIOVERSION N/A 01/31/2018   Procedure: TRANSESOPHAGEAL ECHOCARDIOGRAM (TEE);  Surgeon: Mady Bruckner, MD;  Location: [facility] ORS;  Service: Cardiovascular;  Laterality: N/A;   TEE WITHOUT CARDIOVERSION N/A 02/01/2018   Procedure: TRANSESOPHAGEAL ECHOCARDIOGRAM (TEE);  Surgeon: Perla Evalene PARAS, MD;  Location: [facility] ORS;  Service: Cardiovascular;  Laterality: N/A;    No Known Allergies  Outpatient Encounter Medications as of 04/26/2024  Medication Sig   acetaminophen  (TYLENOL ) 325 MG tablet Take 650 mg by mouth 3 (three)  times daily. Scheduled, see as needed listing   atorvastatin  (LIPITOR) 80 MG tablet Take 1 tablet (80 mg total) by mouth daily at 6 PM.   Citalopram  Hydrobromide 30 MG CAPS Take 30 mg by mouth daily.   clopidogrel  (PLAVIX ) 75 MG tablet Take 1 tablet (75 mg total) by mouth daily.   divalproex  (DEPAKOTE  SPRINKLE) 125 MG capsule Take 375 mg by mouth 2 (two) times daily.   hydrochlorothiazide  (MICROZIDE ) 12.5 MG capsule Take 12.5 mg by mouth daily.   Infant Care Products [facility]) OINT Apply 1 application  topically every 12 (twelve) hours as needed (apply to axilla, groin, and under breast).   latanoprost  (XALATAN ) 0.005 % ophthalmic solution Place 1 drop into both eyes at bedtime.    levothyroxine  (SYNTHROID ) 88 MCG tablet Take 88 mcg by mouth daily. At bedtime   lisinopril  (ZESTRIL ) 20 MG tablet Take 20 mg by mouth daily.   LORazepam  (ATIVAN ) 2 MG tablet Take 2 mg by mouth. Every 24 hours as needed for Prior to Dental appointment.   polyethylene glycol (MIRALAX  / GLYCOLAX ) 17 g packet Take 17 g by mouth every other day. (Patient taking differently: Take 17 g by mouth daily.)   potassium chloride  SA (KLOR-CON  M) 20 MEQ tablet Take 20 mEq by mouth daily.   senna (SENOKOT) 8.6 MG TABS tablet Take 2 tablets by mouth at bedtime.   sodium fluoride (SODIUM FLUORIDE 5000 PLUS) 1.1 % CREA dental cream Place 1 Application onto teeth at bedtime.   tamsulosin  (FLOMAX ) 0.4 MG CAPS capsule Take 1 capsule (0.4 mg total) by mouth daily.   traMADol  (ULTRAM ) 50 MG tablet TAKE 1 TABLET BY MOUTH TWICE DAILY;TAKE 1 TABLET BY MOUTH EVERY 6 HOURS AS NEEDED FOR INCREASED PAIN   NYAMYC powder Apply 1 Application topically every 12 (twelve) hours as needed (for skin breakdown, apply under breast). (Patient not taking: Reported on 04/26/2024)   No facility-administered encounter medications on file as of 04/26/2024.    Review of Systems  Unable to perform ROS: Dementia     Immunization History  Administered Date(s)  Administered   INFLUENZA, HIGH DOSE SEASONAL PF 06/14/2017, 06/15/2022   Influenza Split 07/18/2014, 07/23/2015   Influenza-Unspecified 05/25/2012, 05/31/2016, 06/01/2018, 06/21/2021, 06/22/2023   Moderna Covid-19 Vaccine Bivalent Booster 70yrs & up 01/26/2022, 07/09/2022   Moderna SARS-COV2 Booster Vaccination 07/11/2020, 01/16/2021   Moderna Sars-Covid-2 Vaccination 09/19/2019, 10/17/2019, 08/29/2023   PFIZER Comirnaty(Gray Top)Covid-19 Tri-Sucrose Vaccine 07/09/2022   PNEUMOCOCCAL CONJUGATE-20 02/07/2023   Pfizer Covid-19 Vaccine Bivalent Booster 58yrs & up 05/22/2021   Pneumococcal Polysaccharide-23 08/30/2004, 08/04/2017, 02/15/2019   Zoster Recombinant(Shingrix) 02/08/2023, 03/08/2023   Pertinent  Health Maintenance Due  Topic Date Due   INFLUENZA VACCINE  03/30/2024   DEXA SCAN  Discontinued      04/26/2022   10:00 AM 05/19/2022    9:12 AM 06/02/2022  2:12 PM 02/03/2023    3:02 PM 02/23/2024   10:06 AM  Fall Risk  Falls in the past year?  0  0 0  Was there an injury with Fall?  0     Fall Risk Category Calculator  0     Fall Risk Category (Retired)  Low      (RETIRED) Patient Fall Risk Level High fall risk  Moderate fall risk  High fall risk     Patient at Risk for Falls Due to  History of fall(s)   Impaired mobility  Fall risk Follow up  Falls evaluation completed    Falls evaluation completed     Data saved with a previous flowsheet row definition   Functional Status Survey:    Vitals:   04/26/24 1138 04/26/24 1150  BP: (!) 141/66 127/70  Pulse: 65   Resp: 15   Temp: (!) 97.3 F (36.3 C)   SpO2: 94%   Weight: 149 lb (67.6 kg)   Height: 5' 4 (1.626 m)    Body mass index is 25.58 kg/m. Physical Exam Constitutional:      General: She is not in acute distress.    Appearance: She is well-developed. She is not diaphoretic.  HENT:     Head: Normocephalic and atraumatic.     Mouth/Throat:     Pharynx: No oropharyngeal exudate.  Eyes:     Conjunctiva/sclera:  Conjunctivae normal.     Pupils: Pupils are equal, round, and reactive to light.  Cardiovascular:     Rate and Rhythm: Normal rate and regular rhythm.     Heart sounds: Normal heart sounds.  Pulmonary:     Effort: Pulmonary effort is normal.     Breath sounds: Normal breath sounds.  Abdominal:     General: Bowel sounds are normal.     Palpations: Abdomen is soft.  Musculoskeletal:     Cervical back: Normal range of motion and neck supple.     Right lower leg: No edema.     Left lower leg: No edema.  Skin:    General: Skin is warm and dry.  Neurological:     Mental Status: She is alert. Mental status is at baseline.     Motor: Weakness present.     Gait: Gait abnormal.     Comments: Right side contracture to upper ext.   Psychiatric:        Mood and Affect: Mood normal.     Labs reviewed: Recent Labs    12/29/23 0000  NA 142  K 4.4  CL 101  CO2 34*  BUN 16  CREATININE 0.6  CALCIUM  9.5   Recent Labs    12/29/23 0000 03/30/24 0000  AST 16 18  ALT 10 14  ALKPHOS 52 43  ALBUMIN 3.6 3.4*   Recent Labs    12/29/23 0000 03/30/24 0000 04/19/24 0000  WBC 6.3 4.8 7.1  NEUTROABS 3,660.00 2,534.00 4,175.00  HGB 12.5 11.7* 13.2  HCT 38 36 40  PLT 96* 93* 101*   Lab Results  Component Value Date   TSH 0.50 08/31/2022   Lab Results  Component Value Date   HGBA1C 5.6 01/27/2018   Lab Results  Component Value Date   CHOL 130 07/04/2023   HDL 47 07/04/2023   LDLCALC 69 07/04/2023   TRIG 68 07/04/2023   CHOLHDL 3.7 01/28/2018    Significant Diagnostic Results in last 30 days:  No results found.  Assessment/Plan Other dysphagia Continue dietary modifications. No signs  of aspiration   Dementia, multiinfarct, without behavioral disturbance (HCC) Stable, no acute changes in cognitive or functional status, continue supportive care.   Depression, major, single episode, moderate (HCC) Stable on celexa .   Dyslipidemia Continues on lipitor 80 mg daily    Hypertensive heart disease with congestive heart failure (HCC) Blood pressure controlled, goal bp <140/90 Continue current medications and dietary modifications follow metabolic panel  Hypothyroidism due to acquired atrophy of thyroid  TSH at goal on synthroid  88 mcg  Other chronic pain Well controlled, continues on tramadol       Brenda Cowher K. Caro BODILY [facility] [phone]

## 2024-04-26 NOTE — Assessment & Plan Note (Signed)
Stable on celexa

## 2024-04-26 NOTE — Assessment & Plan Note (Signed)
 Blood pressure controlled, goal bp <140/90 Continue current medications and dietary modifications follow metabolic panel

## 2024-04-26 NOTE — Assessment & Plan Note (Signed)
 Well controlled, continues on tramadol 

## 2024-04-26 NOTE — Assessment & Plan Note (Signed)
 Continue dietary modifications. No signs of aspiration

## 2024-04-26 NOTE — Assessment & Plan Note (Signed)
 TSH at goal on synthroid  88 mcg

## 2024-04-26 NOTE — Assessment & Plan Note (Signed)
 Continues on lipitor 80 mg daily

## 2024-06-14 LAB — BASIC METABOLIC PANEL WITH GFR
BUN: 14 (ref 4–21)
CO2: 33 — AB (ref 13–22)
Chloride: 104 (ref 99–108)
Creatinine: 0.4 — AB (ref 0.5–1.1)
Glucose: 72
Potassium: 4 meq/L (ref 3.5–5.1)
Sodium: 141 (ref 137–147)

## 2024-06-14 LAB — COMPREHENSIVE METABOLIC PANEL WITH GFR
Calcium: 8.8 (ref 8.7–10.7)
eGFR: 96

## 2024-06-20 ENCOUNTER — Non-Acute Institutional Stay (SKILLED_NURSING_FACILITY): Payer: Self-pay | Admitting: Internal Medicine

## 2024-06-20 DIAGNOSIS — E785 Hyperlipidemia, unspecified: Secondary | ICD-10-CM

## 2024-06-20 DIAGNOSIS — N1831 Chronic kidney disease, stage 3a: Secondary | ICD-10-CM

## 2024-06-20 DIAGNOSIS — E034 Atrophy of thyroid (acquired): Secondary | ICD-10-CM | POA: Diagnosis not present

## 2024-06-20 DIAGNOSIS — R197 Diarrhea, unspecified: Secondary | ICD-10-CM | POA: Diagnosis not present

## 2024-06-20 DIAGNOSIS — I10 Essential (primary) hypertension: Secondary | ICD-10-CM

## 2024-06-20 DIAGNOSIS — F0150 Vascular dementia without behavioral disturbance: Secondary | ICD-10-CM | POA: Diagnosis not present

## 2024-06-20 DIAGNOSIS — Z66 Do not resuscitate: Secondary | ICD-10-CM | POA: Diagnosis not present

## 2024-06-20 DIAGNOSIS — F321 Major depressive disorder, single episode, moderate: Secondary | ICD-10-CM | POA: Diagnosis not present

## 2024-06-20 MED ORDER — POLYETHYLENE GLYCOL 3350 17 G PO PACK: 17.0000 g | PACK | Freq: Every day | ORAL | Status: DC | PRN

## 2024-06-20 NOTE — Assessment & Plan Note (Addendum)
 Continue supportive care. Remains on depakote  375 mg bid, celexa  30 mg daily,

## 2024-06-20 NOTE — Progress Notes (Signed)
 [facility] Routine Visit Progress Note    Location:  Other Nursing Home Room Number: 71 - [place] Place of Service:  SNF (31)   Laurence Locus, DO   Patient Care Team: Laurence Locus, DO as PCP - General (Internal Medicine) Perla, Evalene PARAS, MD as Consulting Physician (Cardiology) Landy Barnie RAMAN, NP as Nurse Practitioner (Geriatric Medicine)   Extended Emergency Contact Information Primary Emergency Contact: Eakins,[name] Address: [address]          [place], [place] [address] [place] Mobile Phone: [phone] Relation: Daughter Secondary Emergency Contact: [name] ORN Address: [address]          [place], [place] [address] [place] Home Phone: [phone] Mobile Phone: [phone] Relation: Spouse   Goals of care: Advanced Directive information    04/26/2024   11:46 AM  Advanced Directives  Does Patient Have a Medical Advance Directive? Yes  Type of Estate agent of [place];Living will;Out of facility DNR (pink MOST or yellow form)  Does patient want to make changes to medical advance directive? No - Patient declined  Copy of Healthcare Power of Attorney in Chart? Yes - validated most recent copy scanned in chart (See row information)    CODE STATUS: Full Code Do Not Resuscitate (DNR)   Chief Complaint  Patient presents with   Medical Management of Chronic Issues    Chronic disease management. Routine visit     HPI: Pt is a 88 y.o. female seen today for medical management of chronic disease.  Prior history of stroke, vascular dementia, hypertension, hyperlipidemia, hypothyroidism is being seen today for routine SNF visit.  Nurses without any concern.  Patient verbal.  She is able to recognize her name.  She is not oriented to place or time.  She is sitting in her wheelchair from the TV.  She is intermittently napping.     Past Medical History:  Diagnosis Date   Cerebral infarction  [facility])    CKD (chronic kidney disease), stage III (HCC)    Depression    DJD (degenerative joint disease)    Edema    High cholesterol    Hyperlipidemia, unspecified    Hypertension    Hypothyroidism    Lower GI bleed 01/31/2019   Last Assessment & Plan:  Formatting of this note might be different from the original. Noted several episodes here and at [facility]. Was on dual antiplatelets and still is but asa dose was lowered. She is doing well now and in good spirits today in her room in isolation given covid. No abd pain and eating well with normal bm's per staff.   Malaise    Morbid obesity (HCC)    Osteoarthritis    Parathyroid  abnormality    Seizures (HCC)    a. following remote stroke.   Stroke [facility]) 2002, 05/2017, 01/26/18, 01/30/18   a. 1964-->residual right arm wkns.  Previously on coumadin - pt says for just a few yrs.   Subdural hematoma (HCC)    a. 10/2001 [facility] req temporal frontoparietal craniotomy following fall. ? whether or not pt on coumadin @ time.  Notes indicate yes but pt denies.   Thyroid  disease    TMJ (dislocation of temporomandibular joint)    Tuberculosis    a. ~ 1950   Urinary incontinence    Weight loss    Past Surgical History:  Procedure Laterality Date   ABDOMINAL HYSTERECTOMY     BACK SURGERY     BRAIN  SURGERY     cataract surgery     2014   LEFT HEART CATH AND CORONARY ANGIOGRAPHY N/A 06/13/2017   Procedure: LEFT HEART CATH AND CORONARY ANGIOGRAPHY;  Surgeon: Darron Deatrice LABOR, MD;  Location: [facility] INVASIVE CV LAB;  Service: Cardiovascular;  Laterality: N/A;   PARATHYROID  ADENOMA REMOVAL     TEE WITHOUT CARDIOVERSION N/A 01/31/2018   Procedure: TRANSESOPHAGEAL ECHOCARDIOGRAM (TEE);  Surgeon: Mady Bruckner, MD;  Location: [facility] ORS;  Service: Cardiovascular;  Laterality: N/A;   TEE WITHOUT CARDIOVERSION N/A 02/01/2018   Procedure: TRANSESOPHAGEAL ECHOCARDIOGRAM (TEE);  Surgeon: Perla Evalene PARAS, MD;  Location: [facility] ORS;  Service: Cardiovascular;  Laterality:  N/A;     No Known Allergies   Outpatient Encounter Medications as of 06/20/2024  Medication Sig   acetaminophen  (TYLENOL ) 325 MG tablet Take 650 mg by mouth 3 (three) times daily. Scheduled, see as needed listing   atorvastatin  (LIPITOR) 80 MG tablet Take 1 tablet (80 mg total) by mouth daily at 6 PM.   Citalopram  Hydrobromide 30 MG CAPS Take 30 mg by mouth daily.   clopidogrel  (PLAVIX ) 75 MG tablet Take 1 tablet (75 mg total) by mouth daily.   divalproex  (DEPAKOTE  SPRINKLE) 125 MG capsule Take 375 mg by mouth 2 (two) times daily.   Infant Care Products [facility]) OINT Apply 1 application  topically every 12 (twelve) hours as needed (apply to axilla, groin, and under breast).   latanoprost  (XALATAN ) 0.005 % ophthalmic solution Place 1 drop into both eyes at bedtime.    levothyroxine  (SYNTHROID ) 88 MCG tablet Take 88 mcg by mouth daily. At bedtime   lisinopril  (ZESTRIL ) 20 MG tablet Take 20 mg by mouth daily.   polyethylene glycol (MIRALAX  / GLYCOLAX ) 17 g packet Take 17 g by mouth daily as needed for mild constipation.   senna (SENOKOT) 8.6 MG TABS tablet Take 2 tablets by mouth at bedtime.   sodium fluoride (SODIUM FLUORIDE 5000 PLUS) 1.1 % CREA dental cream Place 1 Application onto teeth at bedtime.   traMADol  (ULTRAM ) 50 MG tablet TAKE 1 TABLET BY MOUTH TWICE DAILY;TAKE 1 TABLET BY MOUTH EVERY 6 HOURS AS NEEDED FOR INCREASED PAIN   [DISCONTINUED] hydrochlorothiazide  (MICROZIDE ) 12.5 MG capsule Take 12.5 mg by mouth daily.   [DISCONTINUED] LORazepam  (ATIVAN ) 2 MG tablet Take 2 mg by mouth. Every 24 hours as needed for Prior to Dental appointment.   [DISCONTINUED] NYAMYC powder Apply 1 Application topically every 12 (twelve) hours as needed (for skin breakdown, apply under breast). (Patient not taking: Reported on 04/26/2024)   [DISCONTINUED] polyethylene glycol (MIRALAX  / GLYCOLAX ) 17 g packet Take 17 g by mouth every other day. (Patient taking differently: Take 17 g by mouth daily.)    [DISCONTINUED] potassium chloride  SA (KLOR-CON  M) 20 MEQ tablet Take 20 mEq by mouth daily.   [DISCONTINUED] tamsulosin  (FLOMAX ) 0.4 MG CAPS capsule Take 1 capsule (0.4 mg total) by mouth daily.   No facility-administered encounter medications on file as of 06/20/2024.     Review of Systems  Unable to perform ROS: Dementia      Immunization History  Administered Date(s) Administered   INFLUENZA, HIGH DOSE SEASONAL PF 06/14/2017, 06/15/2022   Influenza Split 07/18/2014, 07/23/2015   Influenza-Unspecified 05/25/2012, 05/31/2016, 06/01/2018, 06/21/2021, 06/22/2023   Moderna Covid-19 Vaccine Bivalent Booster 54yrs & up 01/26/2022, 07/09/2022   Moderna SARS-COV2 Booster Vaccination 07/11/2020, 01/16/2021   Moderna Sars-Covid-2 Vaccination 09/19/2019, 10/17/2019, 08/29/2023   PFIZER Comirnaty(Gray Top)Covid-19 Tri-Sucrose Vaccine 07/09/2022   PNEUMOCOCCAL CONJUGATE-20 02/07/2023   Pfizer  Covid-19 Vaccine Bivalent Booster 39yrs & up 05/22/2021   Pneumococcal Polysaccharide-23 08/30/2004, 08/04/2017, 02/15/2019   Zoster Recombinant(Shingrix) 02/08/2023, 03/08/2023   Pertinent  Health Maintenance Due  Topic Date Due   Influenza Vaccine  03/30/2024   DEXA SCAN  Discontinued      04/26/2022   10:00 AM 05/19/2022    9:12 AM 06/02/2022    2:12 PM 02/03/2023    3:02 PM 02/23/2024   10:06 AM  Fall Risk  Falls in the past year?  0  0 0  Was there an injury with Fall?  0     Fall Risk Category Calculator  0     Fall Risk Category (Retired)  Low      (RETIRED) Patient Fall Risk Level High fall risk  Moderate fall risk  High fall risk     Patient at Risk for Falls Due to  History of fall(s)   Impaired mobility  Fall risk Follow up  Falls evaluation completed    Falls evaluation completed     Data saved with a previous flowsheet row definition   Functional Status Survey:     Vitals:   06/20/24 1947  BP: 138/76  Pulse: 81  Resp: 18  Temp: 97.7 F (36.5 C)  SpO2: 96%  Weight: 148 lb  12.8 oz (67.5 kg)   Body mass index is 25.54 kg/m. Physical Exam Vitals and nursing note reviewed.  Constitutional:      General: She is not in acute distress.    Appearance: She is not toxic-appearing or diaphoretic.     Comments: Chronically ill-appearing sitting in her wheelchair.  No distress.  HENT:     Head: Normocephalic and atraumatic.  Cardiovascular:     Rate and Rhythm: Normal rate and regular rhythm.  Pulmonary:     Effort: Pulmonary effort is normal.     Breath sounds: Normal breath sounds.  Abdominal:     General: Bowel sounds are normal. There is distension.  Musculoskeletal:     Right lower leg: No edema.     Left lower leg: No edema.  Skin:    General: Skin is warm and dry.     Capillary Refill: Capillary refill takes less than 2 seconds.  Neurological:     Mental Status: She is disoriented.     Comments: Able to move her left hand and right hand voluntarily on command.  She is oriented to person only.  She responds to her name.  Is not oriented to place or time.      Labs reviewed: Recent Labs    12/29/23 0000  NA 142  K 4.4  CL 101  CO2 34*  BUN 16  CREATININE 0.6  CALCIUM  9.5   Recent Labs    12/29/23 0000 03/30/24 0000  AST 16 18  ALT 10 14  ALKPHOS 52 43  ALBUMIN 3.6 3.4*   Recent Labs    12/29/23 0000 03/30/24 0000 04/19/24 0000  WBC 6.3 4.8 7.1  NEUTROABS 3,660.00 2,534.00 4,175.00  HGB 12.5 11.7* 13.2  HCT 38 36 40  PLT 96* 93* 101*   Lab Results  Component Value Date   TSH 0.50 08/31/2022   Lab Results  Component Value Date   HGBA1C 5.6 01/27/2018   Lab Results  Component Value Date   CHOL 130 07/04/2023   HDL 47 07/04/2023   LDLCALC 69 07/04/2023   TRIG 68 07/04/2023   CHOLHDL 3.7 01/28/2018      Assessment & Plan CKD  stage 3a, GFR 45-59 ml/min (HCC) Stable. Scr at 0.8     Primary hypertension Continue with lisinopril  20 mg qd     Dementia, multiinfarct, without behavioral disturbance  (HCC) Continue supportive care. Remains on depakote  375 mg bid, celexa  30 mg daily,      Hypothyroidism due to acquired atrophy of thyroid  Continue synthroid  88 mcg daily     Depression, major, single episode, moderate (HCC) Continue with celexa  30 mg daily.     DNR (do not resuscitate)      Dyslipidemia Continue lipitor 80 mg qd         Camellia Door, DO [facility] [phone]

## 2024-06-20 NOTE — Assessment & Plan Note (Addendum)
 Continue with lisinopril  20 mg qd

## 2024-06-20 NOTE — Assessment & Plan Note (Addendum)
 Stable. Scr at 0.8

## 2024-06-20 NOTE — Assessment & Plan Note (Addendum)
 Continue synthroid 88 mcg daily

## 2024-06-20 NOTE — Assessment & Plan Note (Addendum)
 Continue lipitor 80 mg qd

## 2024-06-20 NOTE — Assessment & Plan Note (Addendum)
 SABRA

## 2024-06-20 NOTE — Assessment & Plan Note (Addendum)
Continue with celexa 30 mg daily

## 2024-07-02 LAB — LIPID PANEL
Cholesterol: 135 (ref 0–200)
HDL: 40 (ref 35–70)
LDL Cholesterol: 76
Triglycerides: 105 (ref 40–160)

## 2024-07-18 ENCOUNTER — Encounter: Payer: Self-pay | Admitting: Orthopedic Surgery

## 2024-07-18 ENCOUNTER — Non-Acute Institutional Stay (SKILLED_NURSING_FACILITY): Payer: Self-pay | Admitting: Orthopedic Surgery

## 2024-07-18 DIAGNOSIS — G8191 Hemiplegia, unspecified affecting right dominant side: Secondary | ICD-10-CM

## 2024-07-18 DIAGNOSIS — Z8673 Personal history of transient ischemic attack (TIA), and cerebral infarction without residual deficits: Secondary | ICD-10-CM | POA: Diagnosis not present

## 2024-07-18 DIAGNOSIS — E034 Atrophy of thyroid (acquired): Secondary | ICD-10-CM

## 2024-07-18 DIAGNOSIS — I110 Hypertensive heart disease with heart failure: Secondary | ICD-10-CM

## 2024-07-18 DIAGNOSIS — Z5181 Encounter for therapeutic drug level monitoring: Secondary | ICD-10-CM

## 2024-07-18 DIAGNOSIS — F339 Major depressive disorder, recurrent, unspecified: Secondary | ICD-10-CM

## 2024-07-18 DIAGNOSIS — N182 Chronic kidney disease, stage 2 (mild): Secondary | ICD-10-CM

## 2024-07-18 DIAGNOSIS — F03918 Unspecified dementia, unspecified severity, with other behavioral disturbance: Secondary | ICD-10-CM

## 2024-07-18 DIAGNOSIS — I5032 Chronic diastolic (congestive) heart failure: Secondary | ICD-10-CM

## 2024-07-18 MED ORDER — ATORVASTATIN CALCIUM 80 MG PO TABS: 80.0000 mg | ORAL_TABLET | Freq: Every day | ORAL | Status: AC

## 2024-07-18 MED ORDER — POLYETHYLENE GLYCOL 3350 17 G PO PACK: 17.0000 g | PACK | Freq: Every day | ORAL | Status: AC

## 2024-07-18 NOTE — Progress Notes (Signed)
 Location:  Other [facility] Room Number: [facility] 407-A Place of Service:  SNF [phone]) Provider:  Greig Cluster, NP  Laurence Locus, DO  Patient Care Team: Laurence Locus, DO as PCP - General (Internal Medicine) Perla, Evalene PARAS, MD as Consulting Physician (Cardiology) Landy Barnie RAMAN, NP as Nurse Practitioner (Geriatric Medicine)  Extended Emergency Contact Information Primary Emergency Contact: Hachey,[name] Address: [address]          [place], [place] [address] [place] Mobile Phone: [phone] Relation: Daughter Secondary Emergency Contact: [name] ORN Address: [address]          [place], [place] [address] [place] Home Phone: [phone] Mobile Phone: [phone] Relation: Spouse  Code Status:  DNR Goals of care: Advanced Directive information    07/18/2024   11:48 AM  Advanced Directives  Does Patient Have a Medical Advance Directive? Yes  Type of Advance Directive Out of facility DNR (pink MOST or yellow form)  Does patient want to make changes to medical advance directive? No - Patient declined     Chief Complaint  Patient presents with   Medical Management of Chronic Issues    HPI:  Pt is a 88 y.o. female seen today for medical management of chronic diseases.    She currently resides on the skilled nursing unit at [facility]. PMH: CHF, HTN, HLD, stoke with right hemiplegia, dementia, dysphagia, hypothyroidism, CKD, thrombocytopenia, depression and chronic pain.   H/o CVA with right hemiplegia- CVA 01/26/2018, dependent with ADLs including feeding, remains on atorvastatin  and plavix  Hypertensive CHF- LVEF 50-55% 01/27/2018, see weight/HTN trends below, remains in lisinopril  CKD- GFR 96 (10/16)> was 84 (05/01) Dementia- recent BIMS 3/15 (10/24), no behaviors, remains on Depakote , valproic acid  level 63 03/30/2024 Hypothyroidism- T4 1.2 12/29/2023, remains on levothyroxine   Depression- Na+ 141  06/14/2024, remains on Celexa   Recent weights:  11/05- 147.5 lbs  10/01- 148.8 lbs  09/03- 148.6 lbs  Recent blood pressures:  11/12- 152/70  11/05- 161/84  10/29- 151/78      Past Medical History:  Diagnosis Date   Cerebral infarction (HCC)    CKD (chronic kidney disease), stage III (HCC)    Depression    DJD (degenerative joint disease)    Edema    High cholesterol    Hyperlipidemia, unspecified    Hypertension    Hypothyroidism    Lower GI bleed 01/31/2019   Last Assessment & Plan:  Formatting of this note might be different from the original. Noted several episodes here and at [facility]. Was on dual antiplatelets and still is but asa dose was lowered. She is doing well now and in good spirits today in her room in isolation given covid. No abd pain and eating well with normal bm's per staff.   Malaise    Morbid obesity (HCC)    Osteoarthritis    Parathyroid  abnormality    Seizures (HCC)    a. following remote stroke.   Stroke [facility]) 2002, 05/2017, 01/26/18, 01/30/18   a. 1964-->residual right arm wkns.  Previously on coumadin - pt says for just a few yrs.   Subdural hematoma (HCC)    a. 10/2001 [facility] req temporal frontoparietal craniotomy following fall. ? whether or not pt on coumadin @ time.  Notes indicate yes but pt denies.   Thyroid  disease    TMJ (dislocation of temporomandibular joint)    Tuberculosis    a. ~ 1950   Urinary incontinence    Weight loss  Past Surgical History:  Procedure Laterality Date   ABDOMINAL HYSTERECTOMY     BACK SURGERY     BRAIN SURGERY     cataract surgery     2014   LEFT HEART CATH AND CORONARY ANGIOGRAPHY N/A 06/13/2017   Procedure: LEFT HEART CATH AND CORONARY ANGIOGRAPHY;  Surgeon: Darron Deatrice LABOR, MD;  Location: [facility] INVASIVE CV LAB;  Service: Cardiovascular;  Laterality: N/A;   PARATHYROID  ADENOMA REMOVAL     TEE WITHOUT CARDIOVERSION N/A 01/31/2018   Procedure: TRANSESOPHAGEAL ECHOCARDIOGRAM (TEE);  Surgeon: Mady Bruckner, MD;   Location: [facility] ORS;  Service: Cardiovascular;  Laterality: N/A;   TEE WITHOUT CARDIOVERSION N/A 02/01/2018   Procedure: TRANSESOPHAGEAL ECHOCARDIOGRAM (TEE);  Surgeon: Perla Evalene PARAS, MD;  Location: [facility] ORS;  Service: Cardiovascular;  Laterality: N/A;    No Known Allergies  Outpatient Encounter Medications as of 07/18/2024  Medication Sig   acetaminophen  (TYLENOL ) 325 MG tablet Take 650 mg by mouth 3 (three) times daily. Scheduled, see as needed listing   atorvastatin  (LIPITOR) 80 MG tablet Take 1 tablet (80 mg total) by mouth daily at 6 PM. (Patient taking differently: Take 80 mg by mouth daily.)   Citalopram  Hydrobromide 30 MG CAPS Take 30 mg by mouth daily.   clopidogrel  (PLAVIX ) 75 MG tablet Take 1 tablet (75 mg total) by mouth daily.   divalproex  (DEPAKOTE  SPRINKLE) 125 MG capsule Take 375 mg by mouth 2 (two) times daily. (Patient taking differently: Take 375 mg by mouth 2 (two) times daily. Give 3 capsules by mouth two times a day related to major depressive disorder, single episode unspecified)   Infant Care Products (DERMACLOUD) OINT Apply 1 application  topically every 12 (twelve) hours as needed (apply to axilla, groin, and under breast).   latanoprost  (XALATAN ) 0.005 % ophthalmic solution Place 1 drop into both eyes at bedtime.    levothyroxine  (SYNTHROID ) 88 MCG tablet Take 88 mcg by mouth daily. At bedtime   lisinopril  (ZESTRIL ) 20 MG tablet Take 20 mg by mouth daily.   polyethylene glycol (MIRALAX  / GLYCOLAX ) 17 g packet Take 17 g by mouth daily as needed for mild constipation. (Patient taking differently: Take 17 g by mouth daily.)   senna (SENOKOT) 8.6 MG TABS tablet Take 2 tablets by mouth at bedtime.   sodium fluoride (SODIUM FLUORIDE 5000 PLUS) 1.1 % CREA dental cream Place 1 Application onto teeth at bedtime.   traMADol  (ULTRAM ) 50 MG tablet TAKE 1 TABLET BY MOUTH TWICE DAILY;TAKE 1 TABLET BY MOUTH EVERY 6 HOURS AS NEEDED FOR INCREASED PAIN   No facility-administered  encounter medications on file as of 07/18/2024.    Review of Systems  Unable to perform ROS: Dementia    Immunization History  Administered Date(s) Administered   INFLUENZA, HIGH DOSE SEASONAL PF 06/14/2017, 06/15/2022, 06/12/2024   Influenza Split 07/18/2014, 07/23/2015   Influenza-Unspecified 05/25/2012, 05/31/2016, 06/01/2018, 06/21/2021, 06/22/2023   Moderna Covid-19 Vaccine Bivalent Booster 71yrs & up 01/26/2022, 07/09/2022   Moderna SARS-COV2 Booster Vaccination 07/11/2020, 01/16/2021   Moderna Sars-Covid-2 Vaccination 09/19/2019, 10/17/2019, 08/29/2023   PFIZER Comirnaty(Gray Top)Covid-19 Tri-Sucrose Vaccine 07/09/2022   PNEUMOCOCCAL CONJUGATE-20 02/07/2023   Pfizer Covid-19 Vaccine Bivalent Booster 51yrs & up 05/22/2021   Pneumococcal Polysaccharide-23 08/30/2004, 08/04/2017, 02/15/2019   Zoster Recombinant(Shingrix) 02/08/2023, 03/08/2023   Pertinent  Health Maintenance Due  Topic Date Due   Influenza Vaccine  Completed   DEXA SCAN  Discontinued      04/26/2022   10:00 AM 05/19/2022    9:12 AM 06/02/2022  2:12 PM 02/03/2023    3:02 PM 02/23/2024   10:06 AM  Fall Risk  Falls in the past year?  0  0 0  Was there an injury with Fall?  0     Fall Risk Category Calculator  0     Fall Risk Category (Retired)  Low      (RETIRED) Patient Fall Risk Level High fall risk  Moderate fall risk  High fall risk     Patient at Risk for Falls Due to  History of fall(s)   Impaired mobility  Fall risk Follow up  Falls evaluation completed    Falls evaluation completed     Data saved with a previous flowsheet row definition   Functional Status Survey:    Vitals:   07/18/24 1146  BP: (!) 152/70  Pulse: 67  Resp: 16  Temp: (!) 97.3 F (36.3 C)  SpO2: 96%  Weight: 147 lb 8 oz (66.9 kg)  Height: 5' 4 (1.626 m)   Body mass index is 25.32 kg/m. Physical Exam Vitals reviewed.  Constitutional:      General: She is not in acute distress. HENT:     Head: Normocephalic.      Right Ear: There is impacted cerumen.     Left Ear: There is impacted cerumen.     Nose: Nose normal.     Mouth/Throat:     Mouth: Mucous membranes are moist.  Eyes:     General:        Right eye: No discharge.  Cardiovascular:     Rate and Rhythm: Normal rate and regular rhythm.     Pulses: Normal pulses.     Heart sounds: Normal heart sounds.  Pulmonary:     Effort: Pulmonary effort is normal.     Breath sounds: Normal breath sounds.  Abdominal:     General: Bowel sounds are normal. There is no distension.     Palpations: Abdomen is soft.     Tenderness: There is no abdominal tenderness.  Musculoskeletal:     Cervical back: Neck supple.     Right lower leg: No edema.     Left lower leg: No edema.     Comments: Bilateral hands contracted  Skin:    General: Skin is warm.     Capillary Refill: Capillary refill takes less than 2 seconds.  Neurological:     General: No focal deficit present.     Mental Status: She is alert. Mental status is at baseline.     Motor: Weakness present.     Gait: Gait abnormal.     Comments: Right sided weakness  Psychiatric:        Mood and Affect: Mood normal.     Comments: Aphasia, alert to self, follows commands     Labs reviewed: Recent Labs    12/29/23 0000 06/14/24 0000  NA 142 141  K 4.4 4.0  CL 101 104  CO2 34* 33*  BUN 16 14  CREATININE 0.6 0.4*  CALCIUM  9.5 8.8   Recent Labs    12/29/23 0000 03/30/24 0000  AST 16 18  ALT 10 14  ALKPHOS 52 43  ALBUMIN 3.6 3.4*   Recent Labs    12/29/23 0000 03/30/24 0000 04/19/24 0000  WBC 6.3 4.8 7.1  NEUTROABS 3,660.00 2,534.00 4,175.00  HGB 12.5 11.7* 13.2  HCT 38 36 40  PLT 96* 93* 101*   Lab Results  Component Value Date   TSH 0.50 08/31/2022   Lab  Results  Component Value Date   HGBA1C 5.6 01/27/2018   Lab Results  Component Value Date   CHOL 135 07/02/2024   HDL 40 07/02/2024   LDLCALC 76 07/02/2024   TRIG 105 07/02/2024   CHOLHDL 3.7 01/28/2018     Significant Diagnostic Results in last 30 days:  No results found.  Assessment/Plan 1. Encounter for medication titration - will reduce Celexa  to 20 mg in GDR attempt  2. Right hemiplegia (HCC) (Primary) - h/o CVA 2019 - dependent with all ADLs  - cont skilled nursing care  3. History of stroke - see above - cont atorvastatin  and plavix   4. Hypertensive heart disease with chronic diastolic congestive heart failure (HCC) - compensated  - BP at goal  - cont lisinopril   5. Stage 2 chronic kidney disease - encourage hydration with water - avoid NSAIDS  6. Dementia with behavioral disturbance (HCC) - no behaviors - non ambulatory - weight stable - cont depakote  for agitation  7. Hypothyroidism due to acquired atrophy of thyroid  - T4 stable - cont levothyroxine    8. Recurrent depression - stable mood - will reduce Celexa  to 20 mg in GDR attempt     Family/ staff Communication: plan discussed with patient and nurse  Labs/tests ordered:  none

## 2024-08-01 ENCOUNTER — Encounter: Payer: Self-pay | Admitting: Orthopedic Surgery

## 2024-08-01 ENCOUNTER — Non-Acute Institutional Stay (SKILLED_NURSING_FACILITY): Admitting: Orthopedic Surgery

## 2024-08-01 DIAGNOSIS — F03918 Unspecified dementia, unspecified severity, with other behavioral disturbance: Secondary | ICD-10-CM

## 2024-08-01 DIAGNOSIS — I110 Hypertensive heart disease with heart failure: Secondary | ICD-10-CM | POA: Diagnosis not present

## 2024-08-01 DIAGNOSIS — I5032 Chronic diastolic (congestive) heart failure: Secondary | ICD-10-CM | POA: Diagnosis not present

## 2024-08-01 NOTE — Progress Notes (Signed)
 Location:  [facility]  [facility]ursing Home Room Number: [facility] [id number] Place of Service:  SNF [phone]) Provider:  Greig Cluster, NP  PCP: Laurence Locus, DO  Patient Care Team: Laurence Locus, DO as PCP - General (Internal Medicine) Perla Evalene PARAS, MD as Consulting Physician (Cardiology) Landy Barnie RAMAN, NP as Nurse Practitioner (Geriatric Medicine)  Extended Emergency Contact Information Primary Emergency Contact: Siebers,[name] Address: [address]          [place], [place] [address] [place] Mobile Phone: [phone] Relation: Daughter Secondary Emergency Contact: [name] ORN Address: [address]          [place], [place] [address] [place] Home Phone: [phone] Mobile Phone: [phone] Relation: Spouse  Code Status:  DNR Goals of care: Advanced Directive information    07/18/2024   11:48 AM  Advanced Directives  Does Patient Have a Medical Advance Directive? Yes  Type of Advance Directive Out of facility DNR (pink MOST or yellow form)  Does patient want to make changes to medical advance directive? No - Patient declined     Chief Complaint  Patient presents with   Hypertension    High Blood Pressure.     HPI:  Pt is a 88 y.o. female seen today for acute visit due to elevated blood pressure.   She currently resides on the skilled nursing unit at [facility]. PMH: CHF, HTN, HLD, stoke with right hemiplegia, dementia, dysphagia, hypothyroidism, CKD, thrombocytopenia, depression and chronic pain.   Elevated blood pressures noted last routine visit. Nursing staff was advised to take manual blood pressures twice daily x 7 days. Review of recent pressures completed. Average SBP > 150. She denies chest pain, sob, headaches. She is taking lisinopril  20 mg daily.   Recent blood pressures:  11/29- 169/87, 140/70  11/26- 152/77, 115/81  11/24- 177/96, 149/82  11/23- 154/98, 140/68  Recent BIMS 3/15 (10/24). No behaviors  per nursing. Remains on Depakote .    Past Medical History:  Diagnosis Date   Cerebral infarction [facility])    CKD (chronic kidney disease), stage III (HCC)    Depression    DJD (degenerative joint disease)    Edema    High cholesterol    Hyperlipidemia, unspecified    Hypertension    Hypothyroidism    Lower GI bleed 01/31/2019   Last Assessment & Plan:  Formatting of this note might be different from the original. Noted several episodes here and at [facility]. Was on dual antiplatelets and still is but asa dose was lowered. She is doing well now and in good spirits today in her room in isolation given covid. No abd pain and eating well with normal bm's per staff.   Malaise    Morbid obesity (HCC)    Osteoarthritis    Parathyroid  abnormality    Seizures (HCC)    a. following remote stroke.   Stroke [facility]) 2002, 05/2017, 01/26/18, 01/30/18   a. 1964-->residual right arm wkns.  Previously on coumadin - pt says for just a few yrs.   Subdural hematoma (HCC)    a. 10/2001 [facility] req temporal frontoparietal craniotomy following fall. ? whether or not pt on coumadin @ time.  Notes indicate yes but pt denies.   Thyroid  disease    TMJ (dislocation of temporomandibular joint)    Tuberculosis    a. ~ 1950   Urinary incontinence    Weight loss    Past Surgical History:  Procedure Laterality Date   ABDOMINAL HYSTERECTOMY  BACK SURGERY     BRAIN SURGERY     cataract surgery     2014   LEFT HEART CATH AND CORONARY ANGIOGRAPHY N/A 06/13/2017   Procedure: LEFT HEART CATH AND CORONARY ANGIOGRAPHY;  Surgeon: Darron Deatrice LABOR, MD;  Location: [facility] INVASIVE CV LAB;  Service: Cardiovascular;  Laterality: N/A;   PARATHYROID  ADENOMA REMOVAL     TEE WITHOUT CARDIOVERSION N/A 01/31/2018   Procedure: TRANSESOPHAGEAL ECHOCARDIOGRAM (TEE);  Surgeon: Mady Bruckner, MD;  Location: [facility] ORS;  Service: Cardiovascular;  Laterality: N/A;   TEE WITHOUT CARDIOVERSION N/A 02/01/2018   Procedure: TRANSESOPHAGEAL ECHOCARDIOGRAM  (TEE);  Surgeon: Perla Evalene PARAS, MD;  Location: [facility] ORS;  Service: Cardiovascular;  Laterality: N/A;    No Known Allergies  Outpatient Encounter Medications as of 08/01/2024  Medication Sig   acetaminophen  (TYLENOL ) 325 MG tablet Take 650 mg by mouth 3 (three) times daily. Scheduled, see as needed listing   atorvastatin  (LIPITOR) 80 MG tablet Take 1 tablet (80 mg total) by mouth daily.   citalopram  (CELEXA ) 20 MG tablet Take 20 mg by mouth daily.   clopidogrel  (PLAVIX ) 75 MG tablet Take 1 tablet (75 mg total) by mouth daily.   divalproex  (DEPAKOTE  SPRINKLE) 125 MG capsule Take 375 mg by mouth 2 (two) times daily.   Infant Care Products [facility]) OINT Apply 1 application  topically every 12 (twelve) hours as needed (apply to axilla, groin, and under breast).   latanoprost  (XALATAN ) 0.005 % ophthalmic solution Place 1 drop into both eyes at bedtime.    levothyroxine  (SYNTHROID ) 88 MCG tablet Take 88 mcg by mouth daily. At bedtime   lisinopril  (ZESTRIL ) 20 MG tablet Take 20 mg by mouth daily.   polyethylene glycol (MIRALAX  / GLYCOLAX ) 17 g packet Take 17 g by mouth daily.   senna (SENOKOT) 8.6 MG TABS tablet Take 2 tablets by mouth at bedtime.   sodium fluoride (SODIUM FLUORIDE 5000 PLUS) 1.1 % CREA dental cream Place 1 Application onto teeth at bedtime.   traMADol  (ULTRAM ) 50 MG tablet TAKE 1 TABLET BY MOUTH TWICE DAILY;TAKE 1 TABLET BY MOUTH EVERY 6 HOURS AS NEEDED FOR INCREASED PAIN   No facility-administered encounter medications on file as of 08/01/2024.    Review of Systems  Unable to perform ROS: Dementia    Immunization History  Administered Date(s) Administered   INFLUENZA, HIGH DOSE SEASONAL PF 06/14/2017, 06/15/2022, 06/12/2024   Influenza Split 07/18/2014, 07/23/2015   Influenza-Unspecified 05/25/2012, 05/31/2016, 06/01/2018, 06/21/2021, 06/22/2023   Moderna Covid-19 Vaccine Bivalent Booster 32yrs & up 01/26/2022, 07/09/2022   Moderna SARS-COV2 Booster Vaccination  07/11/2020, 01/16/2021   Moderna Sars-Covid-2 Vaccination 09/19/2019, 10/17/2019, 08/29/2023, 06/22/2024   PFIZER Comirnaty(Gray Top)Covid-19 Tri-Sucrose Vaccine 07/09/2022   PNEUMOCOCCAL CONJUGATE-20 02/07/2023   Pfizer Covid-19 Vaccine Bivalent Booster 14yrs & up 05/22/2021   Pneumococcal Polysaccharide-23 08/30/2004, 08/04/2017, 02/15/2019   Zoster Recombinant(Shingrix) 02/08/2023, 03/08/2023   Pertinent  Health Maintenance Due  Topic Date Due   Influenza Vaccine  Completed   Bone Density Scan  Discontinued      05/19/2022    9:12 AM 06/02/2022    2:12 PM 02/03/2023    3:02 PM 02/23/2024   10:06 AM 07/18/2024    3:26 PM  Fall Risk  Falls in the past year? 0  0 0 0  Was there an injury with Fall? 0     0   Fall Risk Category Calculator 0    0  Fall Risk Category (Retired) Low       (RETIRED) Patient  Fall Risk Level Moderate fall risk  High fall risk      Patient at Risk for Falls Due to History of fall(s)   Impaired mobility History of fall(s);Impaired balance/gait  Fall risk Follow up Falls evaluation completed    Falls evaluation completed Falls evaluation completed     Data saved with a previous flowsheet row definition   Functional Status Survey:    Vitals:   08/01/24 1111  BP: (!) 140/70  Pulse: 76  Resp: 17  Temp: 98 F (36.7 C)  SpO2: 93%  Weight: 147 lb 8 oz (66.9 kg)  Height: 5' 4 (1.626 m)   Body mass index is 25.32 kg/m. Physical Exam Vitals reviewed.  Constitutional:      General: She is not in acute distress. HENT:     Head: Normocephalic.  Eyes:     General:        Right eye: No discharge.        Left eye: No discharge.  Cardiovascular:     Rate and Rhythm: Normal rate and regular rhythm.     Pulses: Normal pulses.     Heart sounds: Normal heart sounds.  Pulmonary:     Effort: Pulmonary effort is normal.     Breath sounds: Normal breath sounds.  Abdominal:     General: Bowel sounds are normal.     Palpations: Abdomen is soft.   Musculoskeletal:     Cervical back: Neck supple.     Right lower leg: No edema.     Left lower leg: No edema.  Skin:    General: Skin is warm.     Capillary Refill: Capillary refill takes less than 2 seconds.  Neurological:     General: No focal deficit present.     Mental Status: She is alert. Mental status is at baseline.     Motor: Weakness present.     Gait: Gait abnormal.     Comments: Right sided weakness  Psychiatric:        Mood and Affect: Mood normal.     Labs reviewed: Recent Labs    12/29/23 0000 06/14/24 0000  NA 142 141  K 4.4 4.0  CL 101 104  CO2 34* 33*  BUN 16 14  CREATININE 0.6 0.4*  CALCIUM  9.5 8.8   Recent Labs    12/29/23 0000 03/30/24 0000  AST 16 18  ALT 10 14  ALKPHOS 52 43  ALBUMIN 3.6 3.4*   Recent Labs    12/29/23 0000 03/30/24 0000 04/19/24 0000  WBC 6.3 4.8 7.1  NEUTROABS 3,660.00 2,534.00 4,175.00  HGB 12.5 11.7* 13.2  HCT 38 36 40  PLT 96* 93* 101*   Lab Results  Component Value Date   TSH 0.50 08/31/2022   Lab Results  Component Value Date   HGBA1C 5.6 01/27/2018   Lab Results  Component Value Date   CHOL 135 07/02/2024   HDL 40 07/02/2024   LDLCALC 76 07/02/2024   TRIG 105 07/02/2024   CHOLHDL 3.7 01/28/2018    Significant Diagnostic Results in last 30 days:  No results found.  Assessment/Plan 1. Hypertensive heart disease with chronic diastolic congestive heart failure (HCC) (Primary) - uncontrolled, goal < 150/90 - will increase lisinopril  to 30 mg daily - manual blood pressures BID x 7 days - repeat bmp in 2 weeks  2. Dementia with behavioral disturbance (HCC) - no behaviors - dependent with ADLs - weight stable - hoyer transfer - cont Depakote     Family/  staff Communication: plan discussed with patient and nurse  Labs/tests ordered:  bmp in 2 weeks

## 2024-08-09 ENCOUNTER — Non-Acute Institutional Stay (SKILLED_NURSING_FACILITY): Admitting: Nurse Practitioner

## 2024-08-09 ENCOUNTER — Encounter: Payer: Self-pay | Admitting: Nurse Practitioner

## 2024-08-09 DIAGNOSIS — E785 Hyperlipidemia, unspecified: Secondary | ICD-10-CM | POA: Diagnosis not present

## 2024-08-09 DIAGNOSIS — G8929 Other chronic pain: Secondary | ICD-10-CM | POA: Diagnosis not present

## 2024-08-09 DIAGNOSIS — E034 Atrophy of thyroid (acquired): Secondary | ICD-10-CM | POA: Diagnosis not present

## 2024-08-09 DIAGNOSIS — R1319 Other dysphagia: Secondary | ICD-10-CM | POA: Diagnosis not present

## 2024-08-09 DIAGNOSIS — K5901 Slow transit constipation: Secondary | ICD-10-CM | POA: Insufficient documentation

## 2024-08-09 DIAGNOSIS — I110 Hypertensive heart disease with heart failure: Secondary | ICD-10-CM

## 2024-08-09 DIAGNOSIS — F321 Major depressive disorder, single episode, moderate: Secondary | ICD-10-CM

## 2024-08-09 DIAGNOSIS — I5032 Chronic diastolic (congestive) heart failure: Secondary | ICD-10-CM | POA: Diagnosis not present

## 2024-08-09 DIAGNOSIS — F03918 Unspecified dementia, unspecified severity, with other behavioral disturbance: Secondary | ICD-10-CM

## 2024-08-09 NOTE — Assessment & Plan Note (Signed)
 Blood pressure has improved but not to goal Will increase lisinopril  to 40 mg daily and have staff continue to monitor

## 2024-08-09 NOTE — Assessment & Plan Note (Signed)
 Continue synthroid  88 mcg daily, T4 at goal on last labs

## 2024-08-09 NOTE — Progress Notes (Signed)
 Location:  Other [facility].  Nursing Home Room Number: [facility] [id number] Place of Service:  SNF (31) Shawna [name]  PCP: Laurence Locus, DO  Patient Care Team: Laurence Locus, DO as PCP - General (Internal Medicine) Perla, Evalene PARAS, MD as Consulting Physician (Cardiology) Landy Barnie RAMAN, NP as Nurse Practitioner (Geriatric Medicine)  Extended Emergency Contact Information Primary Emergency Contact: Stoltzfus,[name] Address: [address]          [place], [place] [address] [place] Mobile Phone: [phone] Relation: Daughter Secondary Emergency Contact: [name] ORN Address: [address]          [place], [place] [address] [place] Home Phone: [phone] Mobile Phone: [phone] Relation: Spouse  Goals of care: Advanced Directive information    07/18/2024   11:48 AM  Advanced Directives  Does Patient Have a Medical Advance Directive? Yes  Type of Advance Directive Out of facility DNR (pink MOST or yellow form)  Does patient want to make changes to medical advance directive? No - Patient declined     Chief Complaint  Patient presents with   Medical Management of Chronic Issues    Medical Management of Chronic Issues.     HPI:  Pt is a 88 y.o. female seen today for medical management of chronic disease. Pt with hx of htn, dementia, CKD, CVA Her lisinipril was recently increased to 30 mg daily due to elevated blood pressure.  Sbp continues to run 140s. No worsening LE edema or shortness of breath  She has a hx of dysphagia and staff noted a recent coughing episode with meals.  No increase in shortness of breath, congestion or ongoing cough.   Reports bowels moving well on current regimen.   She continues on synthroid  for hypothryoid, T4 was checked in may 2025 and in normal range  No changes in mood noted by staff. She continues on depakote .   Due to chronic pain she continues on tramadol - without signs of  worsening pain.      Past Medical History:  Diagnosis Date   Cerebral infarction [facility])    CKD (chronic kidney disease), stage III (HCC)    Depression    DJD (degenerative joint disease)    Edema    High cholesterol    Hyperlipidemia, unspecified    Hypertension    Hypothyroidism    Lower GI bleed 01/31/2019   Last Assessment & Plan:  Formatting of this note might be different from the original. Noted several episodes here and at [facility]. Was on dual antiplatelets and still is but asa dose was lowered. She is doing well now and in good spirits today in her room in isolation given covid. No abd pain and eating well with normal bm's per staff.   Malaise    Morbid obesity (HCC)    Osteoarthritis    Parathyroid  abnormality    Seizures (HCC)    a. following remote stroke.   Stroke [facility]) 2002, 05/2017, 01/26/18, 01/30/18   a. 1964-->residual right arm wkns.  Previously on coumadin - pt says for just a few yrs.   Subdural hematoma (HCC)    a. 10/2001 [facility] req temporal frontoparietal craniotomy following fall. ? whether or not pt on coumadin @ time.  Notes indicate yes but pt denies.   Thyroid  disease    TMJ (dislocation of temporomandibular joint)    Tuberculosis    a. ~ 1950   Urinary incontinence    Weight loss    Past Surgical History:  Procedure Laterality Date   ABDOMINAL HYSTERECTOMY     BACK SURGERY     BRAIN SURGERY     cataract surgery     2014   LEFT HEART CATH AND CORONARY ANGIOGRAPHY N/A 06/13/2017   Procedure: LEFT HEART CATH AND CORONARY ANGIOGRAPHY;  Surgeon: Darron Deatrice LABOR, MD;  Location: [facility] INVASIVE CV LAB;  Service: Cardiovascular;  Laterality: N/A;   PARATHYROID  ADENOMA REMOVAL     TEE WITHOUT CARDIOVERSION N/A 01/31/2018   Procedure: TRANSESOPHAGEAL ECHOCARDIOGRAM (TEE);  Surgeon: Mady Bruckner, MD;  Location: [facility] ORS;  Service: Cardiovascular;  Laterality: N/A;   TEE WITHOUT CARDIOVERSION N/A 02/01/2018   Procedure: TRANSESOPHAGEAL ECHOCARDIOGRAM (TEE);   Surgeon: Perla Evalene PARAS, MD;  Location: [facility] ORS;  Service: Cardiovascular;  Laterality: N/A;    Allergies[1]  Outpatient Encounter Medications as of 08/09/2024  Medication Sig   acetaminophen  (TYLENOL ) 325 MG tablet Take 650 mg by mouth 3 (three) times daily. Scheduled, see as needed listing   atorvastatin  (LIPITOR) 80 MG tablet Take 1 tablet (80 mg total) by mouth daily.   citalopram  (CELEXA ) 20 MG tablet Take 20 mg by mouth daily.   clopidogrel  (PLAVIX ) 75 MG tablet Take 1 tablet (75 mg total) by mouth daily.   divalproex  (DEPAKOTE  SPRINKLE) 125 MG capsule Take 375 mg by mouth 2 (two) times daily.   Infant Care Products [facility]) OINT Apply 1 application  topically every 12 (twelve) hours as needed (apply to axilla, groin, and under breast).   latanoprost  (XALATAN ) 0.005 % ophthalmic solution Place 1 drop into both eyes at bedtime.    levothyroxine  (SYNTHROID ) 88 MCG tablet Take 88 mcg by mouth daily. At bedtime   lisinopril  (ZESTRIL ) 30 MG tablet Take 30 mg by mouth daily.   polyethylene glycol (MIRALAX  / GLYCOLAX ) 17 g packet Take 17 g by mouth daily.   senna (SENOKOT) 8.6 MG TABS tablet Take 2 tablets by mouth at bedtime.   sodium fluoride (SODIUM FLUORIDE 5000 PLUS) 1.1 % CREA dental cream Place 1 Application onto teeth at bedtime.   traMADol  (ULTRAM ) 50 MG tablet TAKE 1 TABLET BY MOUTH TWICE DAILY;TAKE 1 TABLET BY MOUTH EVERY 6 HOURS AS NEEDED FOR INCREASED PAIN   No facility-administered encounter medications on file as of 08/09/2024.    Review of Systems  Unable to perform ROS: Dementia     Immunization History  Administered Date(s) Administered   INFLUENZA, HIGH DOSE SEASONAL PF 06/14/2017, 06/15/2022, 06/12/2024   Influenza Split 07/18/2014, 07/23/2015   Influenza-Unspecified 05/25/2012, 05/31/2016, 06/01/2018, 06/21/2021, 06/22/2023   Moderna Covid-19 Vaccine Bivalent Booster 10yrs & up 01/26/2022, 07/09/2022   Moderna SARS-COV2 Booster Vaccination 07/11/2020,  01/16/2021   Moderna Sars-Covid-2 Vaccination 09/19/2019, 10/17/2019, 08/29/2023, 06/22/2024   PFIZER Comirnaty(Gray Top)Covid-19 Tri-Sucrose Vaccine 07/09/2022   PNEUMOCOCCAL CONJUGATE-20 02/07/2023   Pfizer Covid-19 Vaccine Bivalent Booster 28yrs & up 05/22/2021   Pneumococcal Polysaccharide-23 08/30/2004, 08/04/2017, 02/15/2019   Zoster Recombinant(Shingrix) 02/08/2023, 03/08/2023   Pertinent  Health Maintenance Due  Topic Date Due   Influenza Vaccine  Completed   Bone Density Scan  Discontinued      06/02/2022    2:12 PM 02/03/2023    3:02 PM 02/23/2024   10:06 AM 07/18/2024    3:26 PM 08/01/2024   11:36 AM  Fall Risk  Falls in the past year?  0 0 0 0  Was there an injury with Fall?    0  0  Fall Risk Category Calculator    0 0  (RETIRED) Patient Fall Risk Level  High fall risk       Patient at Risk for Falls Due to   Impaired mobility History of fall(s);Impaired balance/gait History of fall(s);Impaired balance/gait  Fall risk Follow up   Falls evaluation completed Falls evaluation completed Falls evaluation completed     Data saved with a previous flowsheet row definition   Functional Status Survey:    Vitals:   08/09/24 1003  BP: (!) 143/76  Pulse: 88  Resp: 17  Temp: 97.6 F (36.4 C)  SpO2: 93%  Weight: 138 lb 3.2 oz (62.7 kg)  Height: 5' 4 (1.626 m)   Body mass index is 23.72 kg/m. Physical Exam Constitutional:      General: She is not in acute distress.    Appearance: She is well-developed. She is not diaphoretic.  HENT:     Head: Normocephalic and atraumatic.     Mouth/Throat:     Pharynx: No oropharyngeal exudate.  Eyes:     Conjunctiva/sclera: Conjunctivae normal.     Pupils: Pupils are equal, round, and reactive to light.  Cardiovascular:     Rate and Rhythm: Normal rate and regular rhythm.     Heart sounds: Normal heart sounds.  Pulmonary:     Effort: Pulmonary effort is normal.     Breath sounds: Normal breath sounds.  Abdominal:     General:  Bowel sounds are normal.     Palpations: Abdomen is soft.  Musculoskeletal:     Cervical back: Normal range of motion and neck supple.     Right lower leg: No edema.     Left lower leg: No edema.  Skin:    General: Skin is warm and dry.  Neurological:     Mental Status: She is alert.  Psychiatric:        Mood and Affect: Mood normal.      Labs reviewed: Recent Labs    12/29/23 0000 06/14/24 0000  NA 142 141  K 4.4 4.0  CL 101 104  CO2 34* 33*  BUN 16 14  CREATININE 0.6 0.4*  CALCIUM  9.5 8.8   Recent Labs    12/29/23 0000 03/30/24 0000  AST 16 18  ALT 10 14  ALKPHOS 52 43  ALBUMIN 3.6 3.4*   Recent Labs    12/29/23 0000 03/30/24 0000 04/19/24 0000  WBC 6.3 4.8 7.1  NEUTROABS 3,660.00 2,534.00 4,175.00  HGB 12.5 11.7* 13.2  HCT 38 36 40  PLT 96* 93* 101*   Lab Results  Component Value Date   TSH 0.50 08/31/2022   Lab Results  Component Value Date   HGBA1C 5.6 01/27/2018   Lab Results  Component Value Date   CHOL 135 07/02/2024   HDL 40 07/02/2024   LDLCALC 76 07/02/2024   TRIG 105 07/02/2024   CHOLHDL 3.7 01/28/2018    Significant Diagnostic Results in last 30 days:  No results found.  Assessment/Plan Slow transit constipation Well controlled on senna  Other dysphagia Continue dietary modifications. Recent coughing episode Staff to monitor for s/s of aspiration pneumonia.   Other chronic pain Well controlled, continues on tramadol  schedule with PRN   Hypothyroidism due to acquired atrophy of thyroid  Continue synthroid  88 mcg daily, T4 at goal on last labs  Hypertensive heart disease with congestive heart failure (HCC) Blood pressure has improved but not to goal Will increase lisinopril  to 40 mg daily and have staff continue to monitor  Dyslipidemia Stable, Continues on lipitor 80 mg daily   Depression, major, single episode, moderate (  HCC) Controlled on celexa  20 mg daily   Dementia with behavioral disturbance (HCC) Stable,  no acute changes in cognitive or functional status, continue supportive care.  Continues on depakote  BID   Jaclene Bartelt K. Caro BODILY [facility] [phone]       [1] No Known Allergies

## 2024-08-09 NOTE — Assessment & Plan Note (Signed)
 Well controlled, continues on tramadol  schedule with PRN

## 2024-08-09 NOTE — Assessment & Plan Note (Signed)
 Controlled on celexa  20 mg daily

## 2024-08-09 NOTE — Assessment & Plan Note (Signed)
 Well controlled on senna

## 2024-08-09 NOTE — Assessment & Plan Note (Signed)
 Stable, Continues on lipitor 80 mg daily

## 2024-08-09 NOTE — Assessment & Plan Note (Addendum)
 Stable, no acute changes in cognitive or functional status, continue supportive care.  Continues on depakote  BID

## 2024-08-09 NOTE — Assessment & Plan Note (Signed)
 Continue dietary modifications. Recent coughing episode Staff to monitor for s/s of aspiration pneumonia.

## 2024-08-16 LAB — BASIC METABOLIC PANEL WITH GFR
BUN: 20 (ref 4–21)
CO2: 30 — AB (ref 13–22)
Chloride: 103 (ref 99–108)
Creatinine: 0.5 (ref 0.5–1.1)
Glucose: 58
Potassium: 4 meq/L (ref 3.5–5.1)
Sodium: 143 (ref 137–147)

## 2024-08-16 LAB — COMPREHENSIVE METABOLIC PANEL WITH GFR
Calcium: 9.2 (ref 8.7–10.7)
eGFR: 90

## 2024-09-12 ENCOUNTER — Non-Acute Institutional Stay (SKILLED_NURSING_FACILITY): Admitting: Orthopedic Surgery

## 2024-09-12 ENCOUNTER — Encounter: Payer: Self-pay | Admitting: Orthopedic Surgery

## 2024-09-12 DIAGNOSIS — I110 Hypertensive heart disease with heart failure: Secondary | ICD-10-CM | POA: Diagnosis not present

## 2024-09-12 DIAGNOSIS — F03918 Unspecified dementia, unspecified severity, with other behavioral disturbance: Secondary | ICD-10-CM

## 2024-09-12 DIAGNOSIS — E034 Atrophy of thyroid (acquired): Secondary | ICD-10-CM | POA: Diagnosis not present

## 2024-09-12 DIAGNOSIS — Z8673 Personal history of transient ischemic attack (TIA), and cerebral infarction without residual deficits: Secondary | ICD-10-CM | POA: Diagnosis not present

## 2024-09-12 DIAGNOSIS — R1312 Dysphagia, oropharyngeal phase: Secondary | ICD-10-CM | POA: Diagnosis not present

## 2024-09-12 DIAGNOSIS — G8191 Hemiplegia, unspecified affecting right dominant side: Secondary | ICD-10-CM | POA: Diagnosis not present

## 2024-09-12 DIAGNOSIS — F339 Major depressive disorder, recurrent, unspecified: Secondary | ICD-10-CM

## 2024-09-12 DIAGNOSIS — H6123 Impacted cerumen, bilateral: Secondary | ICD-10-CM | POA: Diagnosis not present

## 2024-09-12 DIAGNOSIS — I5032 Chronic diastolic (congestive) heart failure: Secondary | ICD-10-CM

## 2024-09-12 MED ORDER — DEBROX 6.5 % OT SOLN: 5.0000 [drp] | Freq: Two times a day (BID) | OTIC | Status: AC

## 2024-09-12 NOTE — Progress Notes (Signed)
 " Location:  Other [facility]) Nursing Home Room Number: [facility] [id number] Place of Service:  SNF [phone]) Provider:  Gil Greig FORBES CARROLYN Laurence Camellia, DO  Patient Care Team: Laurence Camellia, DO as PCP - General (Internal Medicine) Perla, Evalene PARAS, MD as Consulting Physician (Cardiology) Landy Barnie RAMAN, NP as Nurse Practitioner (Geriatric Medicine)  Extended Emergency Contact Information Primary Emergency Contact: Topete,[name] Address: [address]          [place], [place] [address] [place] Mobile Phone: [phone] Relation: Daughter Secondary Emergency Contact: [name] ORN Address: [address]          [place], [place] [address] [place] Home Phone: [phone] Mobile Phone: [phone] Relation: Spouse  Code Status:  DNR Goals of care: Advanced Directive information    09/12/2024    9:01 AM  Advanced Directives  Does Patient Have a Medical Advance Directive? Yes  Type of Advance Directive Out of facility DNR (pink MOST or yellow form);Living will;Healthcare Power of Attorney  Does patient want to make changes to medical advance directive? No - Patient declined  Copy of Healthcare Power of Attorney in Chart? Yes - validated most recent copy scanned in chart (See row information)  Pre-existing out of facility DNR order (yellow form or pink MOST form) Pink MOST/Yellow Form most recent copy in chart - Physician notified to receive inpatient order     Chief Complaint  Patient presents with   Medical Management of Chronic Issues    Routine Visit.    HPI:  Pt is a 89 y.o. female seen today for medical management of chronic diseases.    She currently resides on the skilled nursing unit at [facility]. PMH: CHF, HTN, HLD, stoke with right hemiplegia, dementia, dysphagia, hypothyroidism, CKD, thrombocytopenia, depression and chronic pain.    H/o CVA with right hemiplegia- CVA 01/26/2018, dependent with ADLs including feeding,  remains on atorvastatin  and plavix  Hypertensive CHF- LVEF 50-55% 01/27/2018, lisinopril  increased last month, see trends below Dementia- recent BIMS 3/15 (10/24), no behaviors, remains on Depakote , valproic acid  level 63 03/30/2024 Hypothyroidism- T4 1.2 12/29/2023, remains on levothyroxine   Depression- Na+ 143 08/16/2024, remains on Celexa >GDR 07/18/2024> tolerating reduced dose well Dysphagia- remains on pureed diet  No concerns per nursing. Very pleasant during encounter. No recent falls or injuries.   Recent weights:  01/12- 148 lbs  12/04 -138 lbs  11/05- 147.5 lbs  08/2023- 156 lbs   Recent blood pressures:  01/07- 155/89  12/25- 142/76  12/17- 146/79  12/10- 143/76    Past Medical History:  Diagnosis Date   Cerebral infarction (HCC)    CKD (chronic kidney disease), stage III (HCC)    Depression    DJD (degenerative joint disease)    Edema    High cholesterol    Hyperlipidemia, unspecified    Hypertension    Hypothyroidism    Lower GI bleed 01/31/2019   Last Assessment & Plan:  Formatting of this note might be different from the original. Noted several episodes here and at [facility]. Was on dual antiplatelets and still is but asa dose was lowered. She is doing well now and in good spirits today in her room in isolation given covid. No abd pain and eating well with normal bm's per staff.   Malaise    Morbid obesity (HCC)    Osteoarthritis    Parathyroid  abnormality    Seizures (HCC)    a. following remote stroke.   Stroke (HCC) 2002,  05/2017, 01/26/18, 01/30/18   a. 1964-->residual right arm wkns.  Previously on coumadin - pt says for just a few yrs.   Subdural hematoma (HCC)    a. 10/2001 [facility] req temporal frontoparietal craniotomy following fall. ? whether or not pt on coumadin @ time.  Notes indicate yes but pt denies.   Thyroid  disease    TMJ (dislocation of temporomandibular joint)    Tuberculosis    a. ~ 1950   Urinary incontinence    Weight loss    Past Surgical  History:  Procedure Laterality Date   ABDOMINAL HYSTERECTOMY     BACK SURGERY     BRAIN SURGERY     cataract surgery     2014   LEFT HEART CATH AND CORONARY ANGIOGRAPHY N/A 06/13/2017   Procedure: LEFT HEART CATH AND CORONARY ANGIOGRAPHY;  Surgeon: Darron Deatrice LABOR, MD;  Location: [facility] INVASIVE CV LAB;  Service: Cardiovascular;  Laterality: N/A;   PARATHYROID  ADENOMA REMOVAL     TEE WITHOUT CARDIOVERSION N/A 01/31/2018   Procedure: TRANSESOPHAGEAL ECHOCARDIOGRAM (TEE);  Surgeon: Mady Bruckner, MD;  Location: [facility] ORS;  Service: Cardiovascular;  Laterality: N/A;   TEE WITHOUT CARDIOVERSION N/A 02/01/2018   Procedure: TRANSESOPHAGEAL ECHOCARDIOGRAM (TEE);  Surgeon: Perla Evalene PARAS, MD;  Location: [facility] ORS;  Service: Cardiovascular;  Laterality: N/A;    Allergies[1]  Outpatient Encounter Medications as of 09/12/2024  Medication Sig   acetaminophen  (TYLENOL ) 325 MG tablet Take 650 mg by mouth 3 (three) times daily. Scheduled, see as needed listing   atorvastatin  (LIPITOR) 80 MG tablet Take 1 tablet (80 mg total) by mouth daily.   citalopram  (CELEXA ) 20 MG tablet Take 20 mg by mouth daily.   clopidogrel  (PLAVIX ) 75 MG tablet Take 1 tablet (75 mg total) by mouth daily.   divalproex  (DEPAKOTE  SPRINKLE) 125 MG capsule Take 375 mg by mouth 2 (two) times daily.   Infant Care Products [facility]) OINT Apply 1 application  topically every 12 (twelve) hours as needed (apply to axilla, groin, and under breast).   latanoprost  (XALATAN ) 0.005 % ophthalmic solution Place 1 drop into both eyes at bedtime.    levothyroxine  (SYNTHROID ) 88 MCG tablet Take 88 mcg by mouth daily. At bedtime   LISINOPRIL  PO Take 40 mg by mouth daily.   polyethylene glycol (MIRALAX  / GLYCOLAX ) 17 g packet Take 17 g by mouth daily.   senna (SENOKOT) 8.6 MG TABS tablet Take 2 tablets by mouth at bedtime.   sodium fluoride (SODIUM FLUORIDE 5000 PLUS) 1.1 % CREA dental cream Place 1 Application onto teeth at bedtime.   traMADol   (ULTRAM ) 50 MG tablet TAKE 1 TABLET BY MOUTH TWICE DAILY;TAKE 1 TABLET BY MOUTH EVERY 6 HOURS AS NEEDED FOR INCREASED PAIN   lisinopril  (ZESTRIL ) 30 MG tablet Take 30 mg by mouth daily. (Patient not taking: Reported on 09/12/2024)   No facility-administered encounter medications on file as of 09/12/2024.    Review of Systems  Unable to perform ROS: Dementia    Immunization History  Administered Date(s) Administered   INFLUENZA, HIGH DOSE SEASONAL PF 06/14/2017, 06/15/2022, 06/12/2024   Influenza Split 07/18/2014, 07/23/2015   Influenza-Unspecified 05/25/2012, 05/31/2016, 06/01/2018, 06/21/2021, 06/22/2023   Moderna Covid-19 Vaccine Bivalent Booster 61yrs & up 01/26/2022, 07/09/2022   Moderna SARS-COV2 Booster Vaccination 07/11/2020, 01/16/2021   Moderna Sars-Covid-2 Vaccination 09/19/2019, 10/17/2019, 08/29/2023, 06/22/2024   PFIZER Comirnaty(Gray Top)Covid-19 Tri-Sucrose Vaccine 07/09/2022   PNEUMOCOCCAL CONJUGATE-20 02/07/2023   Pfizer Covid-19 Vaccine Bivalent Booster 38yrs & up 05/22/2021   Pneumococcal Polysaccharide-23 08/30/2004,  08/04/2017, 02/15/2019   Zoster Recombinant(Shingrix) 02/08/2023, 03/08/2023   Pertinent  Health Maintenance Due  Topic Date Due   Influenza Vaccine  Completed   Bone Density Scan  Discontinued      06/02/2022    2:12 PM 02/03/2023    3:02 PM 02/23/2024   10:06 AM 07/18/2024    3:26 PM 08/01/2024   11:36 AM  Fall Risk  Falls in the past year?  0 0 0 0  Was there an injury with Fall?    0  0  Fall Risk Category Calculator    0 0  (RETIRED) Patient Fall Risk Level High fall risk       Patient at Risk for Falls Due to   Impaired mobility History of fall(s);Impaired balance/gait History of fall(s);Impaired balance/gait  Fall risk Follow up   Falls evaluation completed Falls evaluation completed Falls evaluation completed     Data saved with a previous flowsheet row definition   Functional Status Survey:    Vitals:   09/12/24 0856 09/12/24 0857   BP: (!) 158/87 (!) 155/89  Pulse: 72   Resp: 17   Temp: 97.9 F (36.6 C)   SpO2: 96%   Weight: 148 lb (67.1 kg)   Height: 5' 4 (1.626 m)    Body mass index is 25.4 kg/m. Physical Exam Vitals reviewed.  Constitutional:      General: She is not in acute distress. HENT:     Head: Normocephalic.     Right Ear: There is impacted cerumen.     Left Ear: There is impacted cerumen.     Nose: Nose normal.     Mouth/Throat:     Mouth: Mucous membranes are moist.  Eyes:     General:        Right eye: No discharge.        Left eye: No discharge.  Cardiovascular:     Rate and Rhythm: Normal rate and regular rhythm.     Pulses: Normal pulses.     Heart sounds: Normal heart sounds.  Pulmonary:     Effort: Pulmonary effort is normal.     Breath sounds: Normal breath sounds.  Abdominal:     General: Bowel sounds are normal. There is no distension.     Palpations: Abdomen is soft.     Tenderness: There is no abdominal tenderness.  Musculoskeletal:     Cervical back: Neck supple.     Right lower leg: Edema present.     Left lower leg: Edema present.     Comments: Non pitting  Skin:    General: Skin is warm.     Capillary Refill: Capillary refill takes less than 2 seconds.  Neurological:     General: No focal deficit present.     Mental Status: She is alert.     Motor: Weakness present.     Gait: Gait abnormal.  Psychiatric:        Mood and Affect: Mood normal.     Labs reviewed: Recent Labs    12/29/23 0000 06/14/24 0000 08/16/24 0000  NA 142 141 143  K 4.4 4.0 4.0  CL 101 104 103  CO2 34* 33* 30*  BUN 16 14 20   CREATININE 0.6 0.4* 0.5  CALCIUM  9.5 8.8 9.2   Recent Labs    12/29/23 0000 03/30/24 0000  AST 16 18  ALT 10 14  ALKPHOS 52 43  ALBUMIN 3.6 3.4*   Recent Labs    12/29/23 0000 03/30/24 0000  04/19/24 0000  WBC 6.3 4.8 7.1  NEUTROABS 3,660.00 2,534.00 4,175.00  HGB 12.5 11.7* 13.2  HCT 38 36 40  PLT 96* 93* 101*   Lab Results  Component  Value Date   TSH 0.50 08/31/2022   Lab Results  Component Value Date   HGBA1C 5.6 01/27/2018   Lab Results  Component Value Date   CHOL 135 07/02/2024   HDL 40 07/02/2024   LDLCALC 76 07/02/2024   TRIG 105 07/02/2024   CHOLHDL 3.7 01/28/2018    Significant Diagnostic Results in last 30 days:  No results found.  Assessment/Plan 1. Bilateral impacted cerumen (Primary) - carbamide peroxide (DEBROX) 6.5 % OTIC solution; Place 5 drops into both ears 2 (two) times daily for 5 days. - flush ears when drops complete  2. Right hemiplegia (HCC) h/o CVA 2019 - dependent with all ADLs  - cont skilled nursing care  3. Hypertensive heart disease with chronic diastolic congestive heart failure (HCC) - controlled, goal < 150/90 - 07/2024 lisinopril  increased to 40 mg daily   4. Dementia with behavioral disturbance (HCC) - no behaviors - dependent with ADLs - nonambulatory - cont Depakote   - cont skilled nursing   5. Hypothyroidism due to acquired atrophy of thyroid  - TSH stable with levothyroxine   6. Recurrent depression - stable mood with reduced Celexa   7. Oropharyngeal dysphagia - no recent aspirations - cont pureed diet  8. History of stroke - cont plavix  and atorvastatin    Family/ staff Communication: plan discussed with patient and nurse  Labs/tests ordered:  none        [1] No Known Allergies  "
